# Patient Record
Sex: Female | Born: 1970 | Race: White | Hispanic: No | Marital: Single | Smoking: Former smoker
Health system: Southern US, Community
[De-identification: ages and names within clinical notes are randomized; demographics above are authoritative.]

## PROBLEM LIST (undated history)

## (undated) DIAGNOSIS — M549 Dorsalgia, unspecified: Secondary | ICD-10-CM

## (undated) DIAGNOSIS — R51 Headache: Secondary | ICD-10-CM

## (undated) DIAGNOSIS — E119 Type 2 diabetes mellitus without complications: Principal | ICD-10-CM

## (undated) DIAGNOSIS — F329 Major depressive disorder, single episode, unspecified: Secondary | ICD-10-CM

## (undated) DIAGNOSIS — F988 Other specified behavioral and emotional disorders with onset usually occurring in childhood and adolescence: Secondary | ICD-10-CM

## (undated) DIAGNOSIS — M797 Fibromyalgia: Secondary | ICD-10-CM

## (undated) DIAGNOSIS — R61 Generalized hyperhidrosis: Secondary | ICD-10-CM

## (undated) DIAGNOSIS — F419 Anxiety disorder, unspecified: Secondary | ICD-10-CM

## (undated) DIAGNOSIS — R519 Headache, unspecified: Secondary | ICD-10-CM

## (undated) DIAGNOSIS — M2550 Pain in unspecified joint: Secondary | ICD-10-CM

## (undated) DIAGNOSIS — H6990 Unspecified Eustachian tube disorder, unspecified ear: Secondary | ICD-10-CM

## (undated) DIAGNOSIS — L299 Pruritus, unspecified: Secondary | ICD-10-CM

## (undated) DIAGNOSIS — F32A Depression, unspecified: Secondary | ICD-10-CM

## (undated) DIAGNOSIS — G8929 Other chronic pain: Secondary | ICD-10-CM

## (undated) DIAGNOSIS — K219 Gastro-esophageal reflux disease without esophagitis: Secondary | ICD-10-CM

## (undated) DIAGNOSIS — E739 Lactose intolerance, unspecified: Secondary | ICD-10-CM

## (undated) HISTORY — PX: SINUS EXPLORATION: SHX5214

## (undated) HISTORY — DX: Pain in unspecified joint: M25.50

## (undated) HISTORY — DX: Headache, unspecified: R51.9

## (undated) HISTORY — DX: Headache: R51

## (undated) HISTORY — DX: Generalized hyperhidrosis: R61

## (undated) HISTORY — DX: Major depressive disorder, single episode, unspecified: F32.9

## (undated) HISTORY — DX: Headache, unspecified: G89.29

## (undated) HISTORY — DX: Dorsalgia, unspecified: M54.9

## (undated) HISTORY — DX: Pruritus, unspecified: L29.9

## (undated) HISTORY — DX: Type 2 diabetes mellitus without complications: E11.9

## (undated) HISTORY — DX: Depression, unspecified: F32.A

## (undated) HISTORY — DX: Anxiety disorder, unspecified: F41.9

## (undated) HISTORY — PX: OTHER SURGICAL HISTORY: SHX169

## (undated) HISTORY — DX: Other specified behavioral and emotional disorders with onset usually occurring in childhood and adolescence: F98.8

## (undated) HISTORY — DX: Gastro-esophageal reflux disease without esophagitis: K21.9

## (undated) HISTORY — DX: Fibromyalgia: M79.7

## (undated) HISTORY — DX: Unspecified eustachian tube disorder, unspecified ear: H69.90

## (undated) HISTORY — DX: Lactose intolerance, unspecified: E73.9

---

## 1999-05-22 ENCOUNTER — Other Ambulatory Visit: Admission: RE | Admit: 1999-05-22 | Discharge: 1999-05-22 | Payer: Self-pay | Admitting: *Deleted

## 1999-05-22 ENCOUNTER — Encounter (INDEPENDENT_AMBULATORY_CARE_PROVIDER_SITE_OTHER): Payer: Self-pay

## 1999-08-21 ENCOUNTER — Other Ambulatory Visit: Admission: RE | Admit: 1999-08-21 | Discharge: 1999-08-21 | Payer: Self-pay | Admitting: *Deleted

## 1999-11-20 ENCOUNTER — Other Ambulatory Visit: Admission: RE | Admit: 1999-11-20 | Discharge: 1999-11-20 | Payer: Self-pay | Admitting: *Deleted

## 2000-07-24 ENCOUNTER — Ambulatory Visit (HOSPITAL_COMMUNITY): Admission: RE | Admit: 2000-07-24 | Discharge: 2000-07-24 | Payer: Self-pay | Admitting: Obstetrics & Gynecology

## 2000-07-24 ENCOUNTER — Encounter: Payer: Self-pay | Admitting: Obstetrics & Gynecology

## 2000-12-09 ENCOUNTER — Inpatient Hospital Stay (HOSPITAL_COMMUNITY): Admission: AD | Admit: 2000-12-09 | Discharge: 2000-12-12 | Payer: Self-pay | Admitting: Obstetrics & Gynecology

## 2001-01-06 ENCOUNTER — Other Ambulatory Visit: Admission: RE | Admit: 2001-01-06 | Discharge: 2001-01-06 | Payer: Self-pay | Admitting: Obstetrics & Gynecology

## 2002-02-11 ENCOUNTER — Encounter: Admission: RE | Admit: 2002-02-11 | Discharge: 2002-02-11 | Payer: Self-pay | Admitting: Obstetrics & Gynecology

## 2002-02-11 ENCOUNTER — Encounter: Payer: Self-pay | Admitting: Obstetrics & Gynecology

## 2006-02-09 ENCOUNTER — Ambulatory Visit: Payer: Self-pay | Admitting: Family Medicine

## 2006-05-08 ENCOUNTER — Ambulatory Visit: Payer: Self-pay | Admitting: Family Medicine

## 2006-06-19 ENCOUNTER — Ambulatory Visit: Payer: Self-pay | Admitting: Family Medicine

## 2006-06-24 ENCOUNTER — Ambulatory Visit: Payer: Self-pay | Admitting: Family Medicine

## 2006-07-31 ENCOUNTER — Ambulatory Visit: Payer: Self-pay | Admitting: Family Medicine

## 2006-08-29 DIAGNOSIS — F329 Major depressive disorder, single episode, unspecified: Secondary | ICD-10-CM

## 2006-08-29 DIAGNOSIS — F3289 Other specified depressive episodes: Secondary | ICD-10-CM | POA: Insufficient documentation

## 2006-11-19 ENCOUNTER — Ambulatory Visit: Payer: Self-pay | Admitting: Internal Medicine

## 2007-02-17 ENCOUNTER — Ambulatory Visit: Payer: Self-pay | Admitting: Family Medicine

## 2007-02-23 ENCOUNTER — Telehealth (INDEPENDENT_AMBULATORY_CARE_PROVIDER_SITE_OTHER): Payer: Self-pay | Admitting: Family Medicine

## 2007-02-24 ENCOUNTER — Telehealth (INDEPENDENT_AMBULATORY_CARE_PROVIDER_SITE_OTHER): Payer: Self-pay | Admitting: *Deleted

## 2007-02-26 ENCOUNTER — Telehealth (INDEPENDENT_AMBULATORY_CARE_PROVIDER_SITE_OTHER): Payer: Self-pay | Admitting: *Deleted

## 2007-03-25 ENCOUNTER — Telehealth (INDEPENDENT_AMBULATORY_CARE_PROVIDER_SITE_OTHER): Payer: Self-pay | Admitting: *Deleted

## 2007-03-26 ENCOUNTER — Ambulatory Visit: Payer: Self-pay | Admitting: Family Medicine

## 2007-03-26 DIAGNOSIS — G43009 Migraine without aura, not intractable, without status migrainosus: Secondary | ICD-10-CM | POA: Insufficient documentation

## 2007-03-26 DIAGNOSIS — H6990 Unspecified Eustachian tube disorder, unspecified ear: Secondary | ICD-10-CM | POA: Insufficient documentation

## 2007-04-15 ENCOUNTER — Telehealth (INDEPENDENT_AMBULATORY_CARE_PROVIDER_SITE_OTHER): Payer: Self-pay | Admitting: Family Medicine

## 2007-04-21 ENCOUNTER — Ambulatory Visit: Payer: Self-pay | Admitting: Family Medicine

## 2007-04-22 ENCOUNTER — Encounter (INDEPENDENT_AMBULATORY_CARE_PROVIDER_SITE_OTHER): Payer: Self-pay | Admitting: Family Medicine

## 2007-04-27 ENCOUNTER — Telehealth (INDEPENDENT_AMBULATORY_CARE_PROVIDER_SITE_OTHER): Payer: Self-pay | Admitting: *Deleted

## 2007-06-08 ENCOUNTER — Encounter (INDEPENDENT_AMBULATORY_CARE_PROVIDER_SITE_OTHER): Payer: Self-pay | Admitting: Family Medicine

## 2007-06-21 ENCOUNTER — Telehealth (INDEPENDENT_AMBULATORY_CARE_PROVIDER_SITE_OTHER): Payer: Self-pay | Admitting: *Deleted

## 2007-07-13 ENCOUNTER — Encounter (INDEPENDENT_AMBULATORY_CARE_PROVIDER_SITE_OTHER): Payer: Self-pay | Admitting: Family Medicine

## 2007-07-15 ENCOUNTER — Ambulatory Visit: Payer: Self-pay | Admitting: Family Medicine

## 2007-09-22 ENCOUNTER — Encounter (INDEPENDENT_AMBULATORY_CARE_PROVIDER_SITE_OTHER): Payer: Self-pay | Admitting: Family Medicine

## 2007-10-11 ENCOUNTER — Telehealth (INDEPENDENT_AMBULATORY_CARE_PROVIDER_SITE_OTHER): Payer: Self-pay | Admitting: Family Medicine

## 2007-11-08 ENCOUNTER — Ambulatory Visit: Payer: Self-pay | Admitting: Family Medicine

## 2008-05-03 ENCOUNTER — Telehealth (INDEPENDENT_AMBULATORY_CARE_PROVIDER_SITE_OTHER): Payer: Self-pay | Admitting: *Deleted

## 2008-05-22 ENCOUNTER — Ambulatory Visit: Payer: Self-pay | Admitting: Family Medicine

## 2008-05-22 DIAGNOSIS — F988 Other specified behavioral and emotional disorders with onset usually occurring in childhood and adolescence: Secondary | ICD-10-CM | POA: Insufficient documentation

## 2008-06-20 ENCOUNTER — Ambulatory Visit: Payer: Self-pay | Admitting: Family Medicine

## 2008-06-27 ENCOUNTER — Ambulatory Visit: Payer: Self-pay | Admitting: Family Medicine

## 2008-06-27 LAB — CONVERTED CEMR LAB: Rapid Strep: POSITIVE

## 2008-07-12 ENCOUNTER — Ambulatory Visit: Payer: Self-pay | Admitting: Internal Medicine

## 2008-07-12 DIAGNOSIS — J310 Chronic rhinitis: Secondary | ICD-10-CM | POA: Insufficient documentation

## 2008-08-16 ENCOUNTER — Telehealth (INDEPENDENT_AMBULATORY_CARE_PROVIDER_SITE_OTHER): Payer: Self-pay | Admitting: *Deleted

## 2008-08-24 ENCOUNTER — Encounter (INDEPENDENT_AMBULATORY_CARE_PROVIDER_SITE_OTHER): Payer: Self-pay | Admitting: *Deleted

## 2008-08-24 ENCOUNTER — Ambulatory Visit: Payer: Self-pay | Admitting: Family Medicine

## 2008-08-24 DIAGNOSIS — J019 Acute sinusitis, unspecified: Secondary | ICD-10-CM | POA: Insufficient documentation

## 2008-09-12 ENCOUNTER — Ambulatory Visit: Payer: Self-pay | Admitting: Family Medicine

## 2008-09-12 ENCOUNTER — Telehealth (INDEPENDENT_AMBULATORY_CARE_PROVIDER_SITE_OTHER): Payer: Self-pay | Admitting: *Deleted

## 2008-09-15 ENCOUNTER — Telehealth (INDEPENDENT_AMBULATORY_CARE_PROVIDER_SITE_OTHER): Payer: Self-pay | Admitting: *Deleted

## 2008-09-19 ENCOUNTER — Telehealth (INDEPENDENT_AMBULATORY_CARE_PROVIDER_SITE_OTHER): Payer: Self-pay | Admitting: *Deleted

## 2008-09-26 ENCOUNTER — Encounter: Payer: Self-pay | Admitting: Family Medicine

## 2008-10-16 ENCOUNTER — Telehealth (INDEPENDENT_AMBULATORY_CARE_PROVIDER_SITE_OTHER): Payer: Self-pay | Admitting: *Deleted

## 2008-10-20 ENCOUNTER — Ambulatory Visit: Payer: Self-pay | Admitting: Family Medicine

## 2008-11-14 ENCOUNTER — Telehealth (INDEPENDENT_AMBULATORY_CARE_PROVIDER_SITE_OTHER): Payer: Self-pay | Admitting: *Deleted

## 2008-11-15 ENCOUNTER — Telehealth (INDEPENDENT_AMBULATORY_CARE_PROVIDER_SITE_OTHER): Payer: Self-pay | Admitting: *Deleted

## 2008-12-18 ENCOUNTER — Telehealth (INDEPENDENT_AMBULATORY_CARE_PROVIDER_SITE_OTHER): Payer: Self-pay | Admitting: *Deleted

## 2009-01-16 ENCOUNTER — Ambulatory Visit: Payer: Self-pay | Admitting: Family Medicine

## 2009-02-15 ENCOUNTER — Telehealth (INDEPENDENT_AMBULATORY_CARE_PROVIDER_SITE_OTHER): Payer: Self-pay | Admitting: *Deleted

## 2009-03-02 ENCOUNTER — Ambulatory Visit: Payer: Self-pay | Admitting: Family Medicine

## 2009-03-02 DIAGNOSIS — M25559 Pain in unspecified hip: Secondary | ICD-10-CM | POA: Insufficient documentation

## 2009-03-20 ENCOUNTER — Telehealth (INDEPENDENT_AMBULATORY_CARE_PROVIDER_SITE_OTHER): Payer: Self-pay | Admitting: *Deleted

## 2009-04-18 ENCOUNTER — Telehealth (INDEPENDENT_AMBULATORY_CARE_PROVIDER_SITE_OTHER): Payer: Self-pay | Admitting: *Deleted

## 2009-05-16 ENCOUNTER — Telehealth (INDEPENDENT_AMBULATORY_CARE_PROVIDER_SITE_OTHER): Payer: Self-pay | Admitting: *Deleted

## 2009-06-14 ENCOUNTER — Telehealth (INDEPENDENT_AMBULATORY_CARE_PROVIDER_SITE_OTHER): Payer: Self-pay | Admitting: *Deleted

## 2009-06-22 ENCOUNTER — Telehealth (INDEPENDENT_AMBULATORY_CARE_PROVIDER_SITE_OTHER): Payer: Self-pay | Admitting: *Deleted

## 2009-06-27 ENCOUNTER — Encounter: Payer: Self-pay | Admitting: Family Medicine

## 2009-07-18 ENCOUNTER — Telehealth: Payer: Self-pay | Admitting: Family Medicine

## 2009-07-23 ENCOUNTER — Telehealth (INDEPENDENT_AMBULATORY_CARE_PROVIDER_SITE_OTHER): Payer: Self-pay | Admitting: *Deleted

## 2009-08-16 ENCOUNTER — Ambulatory Visit: Payer: Self-pay | Admitting: Family Medicine

## 2009-08-20 ENCOUNTER — Telehealth (INDEPENDENT_AMBULATORY_CARE_PROVIDER_SITE_OTHER): Payer: Self-pay | Admitting: *Deleted

## 2009-09-25 ENCOUNTER — Telehealth (INDEPENDENT_AMBULATORY_CARE_PROVIDER_SITE_OTHER): Payer: Self-pay | Admitting: *Deleted

## 2009-09-26 ENCOUNTER — Encounter: Payer: Self-pay | Admitting: Family Medicine

## 2009-10-19 ENCOUNTER — Telehealth (INDEPENDENT_AMBULATORY_CARE_PROVIDER_SITE_OTHER): Payer: Self-pay | Admitting: *Deleted

## 2009-10-22 ENCOUNTER — Ambulatory Visit: Payer: Self-pay | Admitting: Family Medicine

## 2009-10-24 ENCOUNTER — Telehealth (INDEPENDENT_AMBULATORY_CARE_PROVIDER_SITE_OTHER): Payer: Self-pay | Admitting: *Deleted

## 2009-10-25 ENCOUNTER — Encounter: Payer: Self-pay | Admitting: Family Medicine

## 2009-11-21 ENCOUNTER — Telehealth (INDEPENDENT_AMBULATORY_CARE_PROVIDER_SITE_OTHER): Payer: Self-pay | Admitting: *Deleted

## 2009-12-27 ENCOUNTER — Telehealth (INDEPENDENT_AMBULATORY_CARE_PROVIDER_SITE_OTHER): Payer: Self-pay | Admitting: *Deleted

## 2010-01-07 ENCOUNTER — Ambulatory Visit: Payer: Self-pay | Admitting: Family Medicine

## 2010-01-07 DIAGNOSIS — F32A Depression, unspecified: Secondary | ICD-10-CM | POA: Insufficient documentation

## 2010-01-07 DIAGNOSIS — F418 Other specified anxiety disorders: Secondary | ICD-10-CM | POA: Insufficient documentation

## 2010-01-07 DIAGNOSIS — F438 Other reactions to severe stress: Secondary | ICD-10-CM | POA: Insufficient documentation

## 2010-01-29 ENCOUNTER — Telehealth (INDEPENDENT_AMBULATORY_CARE_PROVIDER_SITE_OTHER): Payer: Self-pay | Admitting: *Deleted

## 2010-02-26 ENCOUNTER — Telehealth (INDEPENDENT_AMBULATORY_CARE_PROVIDER_SITE_OTHER): Payer: Self-pay | Admitting: *Deleted

## 2010-03-01 ENCOUNTER — Telehealth (INDEPENDENT_AMBULATORY_CARE_PROVIDER_SITE_OTHER): Payer: Self-pay | Admitting: *Deleted

## 2010-04-01 ENCOUNTER — Telehealth (INDEPENDENT_AMBULATORY_CARE_PROVIDER_SITE_OTHER): Payer: Self-pay | Admitting: *Deleted

## 2010-05-03 ENCOUNTER — Telehealth (INDEPENDENT_AMBULATORY_CARE_PROVIDER_SITE_OTHER): Payer: Self-pay | Admitting: *Deleted

## 2010-06-03 ENCOUNTER — Telehealth: Payer: Self-pay | Admitting: Family Medicine

## 2010-06-05 ENCOUNTER — Encounter: Payer: Self-pay | Admitting: Family Medicine

## 2010-06-05 ENCOUNTER — Telehealth (INDEPENDENT_AMBULATORY_CARE_PROVIDER_SITE_OTHER): Payer: Self-pay | Admitting: *Deleted

## 2010-07-08 ENCOUNTER — Encounter: Payer: Self-pay | Admitting: Family Medicine

## 2010-07-09 ENCOUNTER — Ambulatory Visit: Payer: Self-pay | Admitting: Family Medicine

## 2010-07-09 DIAGNOSIS — R635 Abnormal weight gain: Secondary | ICD-10-CM | POA: Insufficient documentation

## 2010-07-11 ENCOUNTER — Telehealth (INDEPENDENT_AMBULATORY_CARE_PROVIDER_SITE_OTHER): Payer: Self-pay | Admitting: *Deleted

## 2010-07-11 LAB — CONVERTED CEMR LAB
Free T4: 0.88 ng/dL (ref 0.60–1.60)
T3, Free: 3.2 pg/mL (ref 2.3–4.2)
TSH: 0.9 microintl units/mL (ref 0.35–5.50)

## 2010-07-17 ENCOUNTER — Encounter
Admission: RE | Admit: 2010-07-17 | Discharge: 2010-07-17 | Payer: Self-pay | Source: Home / Self Care | Attending: Family Medicine | Admitting: Family Medicine

## 2010-07-22 ENCOUNTER — Telehealth: Payer: Self-pay | Admitting: Family Medicine

## 2010-08-06 ENCOUNTER — Telehealth (INDEPENDENT_AMBULATORY_CARE_PROVIDER_SITE_OTHER): Payer: Self-pay | Admitting: *Deleted

## 2010-09-03 NOTE — Progress Notes (Signed)
Summary:  prior auth APPROVED ADDERALL [facility]  Phone Note Refill Request Message from:  Fax from Pharmacy on September 25, 2009 4:18 PM  Refills Requested: Medication #1:  ADDERALL XR 30 MG XR24H-CAP 1 by mouth qam prior authorization fax [phone] [facility]   Initial call taken by: Barb Merino,  September 25, 2009 4:18 PM  Follow-up for Phone Call        prior auth in process awaiting fax.Marti Sleigh Deloach CMA  September 25, 2009 4:45 PM  prior auth faxed back awaiting response.................Marland KitchenFelecia Deloach CMA  September 26, 2009 12:05 PM   Additional Follow-up for Phone Call Additional follow up Details #1::        PRIOR AUTH APPROVED 09-05-09 UNTIL 09-26-10, PHARMACY FAXED APPROVAL LETTER SCAN TO CHART..................Marland KitchenFelecia Deloach CMA  September 27, 2009 8:31 AM

## 2010-09-03 NOTE — Progress Notes (Signed)
Summary: refill  Phone Note Refill Request Message from:  Fax from Pharmacy on [facility]  Refills Requested: Medication #1:  ZOLOFT 100 MG TABS 1 by mouth once daily   Supply Requested: 3 months Initial call taken by: Jeremy Johann CMA,  March 01, 2010 4:25 PM  Follow-up for Phone Call        left pt detail message rx request recieved from [facility] pharmacy, rx sent in please return call if pt does not want rx sent to mail order................Marland KitchenFelecia Deloach CMA  March 01, 2010 4:27 PM     Prescriptions: ZOLOFT 100 MG TABS (SERTRALINE HCL) 1 by mouth once daily  #90 x 3   Entered by:   Jeremy Johann CMA   Authorized by:   Loreen Freud DO   Signed by:   Jeremy Johann CMA on 03/01/2010   Method used:   Faxed to ...       [facility]* (retail)             ,          Ph: [phone]       Fax: [phone]   RxID:   [id number]

## 2010-09-03 NOTE — Progress Notes (Signed)
Summary: med not strong enough  Phone Note Call from Patient   Caller: Patient Summary of Call: pt states that she is really stressed, moody,complaining a lot and tolerate level has decrease. pt states that she is taking zoloft 50mg  once daily  and it did work initially but now it feel as though it not helping with all the symptoms completely..pls advise......................Marland KitchenFelecia Deloach CMA  October 19, 2009 11:16 AM   Follow-up for Phone Call        ok to take 100mg  but pt needs ov Follow-up by: Loreen Freud DO,  October 19, 2009 12:50 PM  Additional Follow-up for Phone Call Additional follow up Details #1::        left pt detail message ok to increase med to 100mg  and to give [place] a call to schedule OV to further discuss changes in symptoms...........Marland KitchenFelecia Deloach CMA  October 19, 2009 12:57 PM     Additional Follow-up for Phone Call Additional follow up Details #2::    pt aware, OV schedule....................Marland KitchenFelecia Deloach CMA  October 22, 2009 8:44 AM   New/Updated Medications: ZOLOFT 50 MG TABS (SERTRALINE HCL) 2 by mouth once daily

## 2010-09-03 NOTE — Progress Notes (Signed)
Summary: refill  Phone Note Refill Request Call back at Work Phone [phone] Message from:  Patient on September 25, 2009 10:16 AM  Refills Requested: Medication #1:  ADDERALL XR 30 MG XR24H-CAP 1 by mouth qam  Method Requested: Pick up at Office Next Appointment Scheduled: no appt Initial call taken by: Barb Merino,  September 25, 2009 10:16 AM    Prescriptions: ADDERALL XR 30 MG XR24H-CAP (AMPHETAMINE-DEXTROAMPHETAMINE) 1 by mouth qam  #30 x 0   Entered by:   Army Fossa CMA   Authorized by:   Loreen Freud DO   Signed by:   Army Fossa CMA on 09/25/2009   Method used:   Print then Give to Patient   RxID:   [id number]   Appended Document: refill pt aware that rx is ready.

## 2010-09-03 NOTE — Assessment & Plan Note (Signed)
Summary: FOR MED REFILL   Vital Signs:  Patient profile:   40 year old female Height:      64 inches Weight:      154.6 pounds BMI:     26.63 Pulse rate:   56 / minute Pulse rhythm:   regular BP sitting:   116 / 74  (right arm) Cuff size:   large  Vitals Entered By: Almeta Monas CMA Duncan Dull) (July 09, 2010 4:11 PM) CC: Med f/u--Ritalin not as effective as Adderall also wanted to get a TSH done.   History of Present Illness: Pt here f/u ritalin--she feels like it is not as effective as adderall.   Pt also c/o difficulty losing weight.  Pt is exercising 2-3 days a week at gym.  She is trying to eat healthy--1700 cal---using app on phone.   Pt saw nutrition about a year ago when her husband had an MI at 77yo.    Current Medications (verified): 1)  Loestrin 24 Fe 1-20 Mg-Mcg Tabs (Norethin Ace-Eth Estrad-Fe) .... Take 1 Tab Once Daily 2)  Adderall Xr 30 Mg Xr24h-Cap (Amphetamine-Dextroamphetamine) .[name].. 1 By Mouth Qam 3)  Fluticasone Propionate 50 Mcg/act Susp (Fluticasone Propionate) .[name].. 1-2 Sprays Each Nostril Once Daily 4)  Zoloft 100 Mg Tabs (Sertraline Hcl) .[name].. 1 By Mouth Once Daily 5)  Iron 18 Mg Cr-Tabs (Ferrous Fumarate) .... Take 1 Tab Once Daily 6)  Xyzal 5 Mg Tabs (Levocetirizine Dihydrochloride) .[name].. 1 By Mouth Qpm 7)  Ritalin La 30 Mg Xr24h-Cap (Methylphenidate Hcl) .[name].. 1 By Mouth Qam  Allergies (verified): 1)  Augmentin  Past History:  Past Medical History: Last updated: 06/20/2008 Depression Current Problems:  ADD (ICD-314.00) DISORDER, EUSTACHIAN TUBE NOS (ICD-381.9) COMMON MIGRAINE (ICD-346.10) SINUSITIS (ICD-473.9) DEPRESSION (ICD-311)  Family History: Last updated: 11/08/2007 sister-- ADD F--dyslexia  Review of Systems      See HPI  Physical Exam  General:  Well-developed,well-nourished,in no acute distress; alert,appropriate and cooperative throughout examination Neck:  ? nodule L side thyroid Lungs:  Normal respiratory effort, chest  expands symmetrically. Lungs are clear to auscultation, no crackles or wheezes. Heart:  normal rate and no murmur.   Psych:  Cognition and judgment appear intact. Alert and cooperative with normal attention span and concentration. No apparent delusions, illusions, hallucinations   Impression & Recommendations:  Problem # 1:  ? of THYROID NODULE (ICD-241.0)  Orders: TLB-TSH (Thyroid Stimulating Hormone) (84443-TSH) Venipuncture (16109) TLB-T3, Free (Triiodothyronine) (84481-T3FREE) TLB-T4 (Thyrox), Free [phone]) Specimen Handling (19147) Radiology Referral (Radiology)  Problem # 2:  WEIGHT GAIN (ICD-783.1)  Orders: TLB-TSH (Thyroid Stimulating Hormone) (84443-TSH) Venipuncture (82956) TLB-T3, Free (Triiodothyronine) (84481-T3FREE) TLB-T4 (Thyrox), Free [phone]) Specimen Handling (46962) Radiology Referral (Radiology)  Problem # 3:  ADD (ICD-314.00) increase ritalin la 30 mg once daily   Complete Medication List: 1)  Loestrin 24 Fe 1-20 Mg-mcg Tabs (Norethin ace-eth estrad-fe) .... Take 1 tab once daily 2)  Adderall Xr 30 Mg Xr24h-cap (Amphetamine-dextroamphetamine) .[name].. 1 by mouth qam 3)  Fluticasone Propionate 50 Mcg/act Susp (Fluticasone propionate) .[name].. 1-2 sprays each nostril once daily 4)  Zoloft 100 Mg Tabs (Sertraline hcl) .[name].. 1 by mouth once daily 5)  Iron 18 Mg Cr-tabs (Ferrous fumarate) .... Take 1 tab once daily 6)  Xyzal 5 Mg Tabs (Levocetirizine dihydrochloride) .[name].. 1 by mouth qpm 7)  Ritalin La 30 Mg Xr24h-cap (Methylphenidate hcl) .[name].. 1 by mouth qam Prescriptions: RITALIN LA 30 MG XR24H-CAP (METHYLPHENIDATE HCL) 1 by mouth qam  #30 x 0   Entered and  Authorized by:   Loreen Freud DO   Signed by:   Loreen Freud DO on 07/09/2010   Method used:   Print then Give to Patient   RxID:   [id number]    Orders Added: 1)  TLB-TSH (Thyroid Stimulating Hormone) [84443-TSH] 2)  Venipuncture [14782] 3)  TLB-T3, Free (Triiodothyronine)  [95621-H0QMVH] 4)  TLB-T4 (Thyrox), Free [84696-EX5M] 5)  Specimen Handling [99000] 6)  Est. Patient Level III [84132] 7)  Radiology Referral [Radiology]    Impression & Recommendations:  Problem # 1:  ? of THYROID NODULE (ICD-241.0)  Orders: TLB-TSH (Thyroid Stimulating Hormone) (84443-TSH) Venipuncture (44010) TLB-T3, Free (Triiodothyronine) (84481-T3FREE) TLB-T4 (Thyrox), Free [phone]) Specimen Handling (03474) Radiology Referral (Radiology)  Problem # 2:  WEIGHT GAIN (ICD-783.1)  Orders: TLB-TSH (Thyroid Stimulating Hormone) (84443-TSH) Venipuncture (25956) TLB-T3, Free (Triiodothyronine) (84481-T3FREE) TLB-T4 (Thyrox), Free (38756-EP3I) Specimen Handling (95188) Radiology Referral (Radiology)  Problem # 3:  ADD (ICD-314.00) increase ritalin la 30 mg once daily   Complete Medication List: 1)  Loestrin 24 Fe 1-20 Mg-mcg Tabs (Norethin ace-eth estrad-fe) .... Take 1 tab once daily 2)  Adderall Xr 30 Mg Xr24h-cap (Amphetamine-dextroamphetamine) .[name].. 1 by mouth qam 3)  Fluticasone Propionate 50 Mcg/act Susp (Fluticasone propionate) .[name].. 1-2 sprays each nostril once daily 4)  Zoloft 100 Mg Tabs (Sertraline hcl) .[name].. 1 by mouth once daily 5)  Iron 18 Mg Cr-tabs (Ferrous fumarate) .... Take 1 tab once daily 6)  Xyzal 5 Mg Tabs (Levocetirizine dihydrochloride) .[name].. 1 by mouth qpm 7)  Ritalin La 30 Mg Xr24h-cap (Methylphenidate hcl) .[name].. 1 by mouth qam

## 2010-09-03 NOTE — Progress Notes (Signed)
Summary: prior [facility]  Phone Note Refill Request   Refills Requested: Medication #1:  XYZAL 5 MG TABS 1 by mouth qpm. prior auth [phone]  Initial call taken by: Jeremy Johann CMA,  October 24, 2009 4:36 PM  Follow-up for Phone Call        prior auth faxed back awaiting response........Marland KitchenFelecia Deloach CMA  October 25, 2009 9:18 AM   Additional Follow-up for Phone Call Additional follow up Details #1::        PRIOR AUTH APPROVED 10-04-09 UNTIL 10-25-10, PHARMACY FAXED, APPROVAL LETTER SCAN TO CHART.....................Marland KitchenFelecia Deloach CMA  October 26, 2009 9:01 AM

## 2010-09-03 NOTE — Medication Information (Signed)
Summary: Prior Authorization & Approval for Ritalin/[facility]  Prior Authorization & Approval for Ritalin/[facility]   Imported By: Lanelle Bal 06/11/2010 14:15:08  _____________________________________________________________________  External Attachment:    Type:   Image     Comment:   External Document

## 2010-09-03 NOTE — Assessment & Plan Note (Signed)
Summary: discuss med//fd   Vital Signs:  Patient profile:   40 year old female Weight:      153 pounds Pulse rate:   88 / minute Pulse rhythm:   regular BP sitting:   118 / 84  (left arm) Cuff size:   large  Vitals Entered By: Army Fossa CMA (October 22, 2009 3:35 PM) CC: Pt here to follow up on zoloft also having a lot of drainage in her throat not all the time though.   History of Present Illness: Pt here to discuss zoloft.  She increased dose to 100mg  daily on friday as instructed--see phone note. Pt doing better emotionally..  Pt also needs refill on adderall and is c/o runny nose and drainage but pt just had sinus surgery.    Current Medications (verified): 1)  Loestrin 24 Fe 1-20 Mg-Mcg Tabs (Norethin Ace-Eth Estrad-Fe) .... Take 1 Tab Once Daily 2)  Adderall Xr 30 Mg Xr24h-Cap (Amphetamine-Dextroamphetamine) .[name].. 1 By Mouth Qam 3)  Fluticasone Propionate 50 Mcg/act Susp (Fluticasone Propionate) .[name].. 1-2 Sprays Each Nostril Once Daily 4)  Zoloft 100 Mg Tabs (Sertraline Hcl) .[name].. 1 By Mouth Once Daily 5)  Iron 18 Mg Cr-Tabs (Ferrous Fumarate) .... Take 1 Tab Once Daily 6)  Xyzal 5 Mg Tabs (Levocetirizine Dihydrochloride) .[name].. 1 By Mouth Qpm  Allergies: 1)  Augmentin  Past History:  Past medical, surgical, family and social histories (including risk factors) reviewed for relevance to current acute and chronic problems.  Past Medical History: Reviewed history from 06/20/2008 and no changes required. Depression Current Problems:  ADD (ICD-314.00) DISORDER, EUSTACHIAN TUBE NOS (ICD-381.9) COMMON MIGRAINE (ICD-346.10) SINUSITIS (ICD-473.9) DEPRESSION (ICD-311)  Family History: Reviewed history from 11/08/2007 and no changes required. sister-- ADD F--dyslexia  Social History: Reviewed history and no changes required.  Review of Systems      See HPI  Physical Exam  General:  Well-developed,well-nourished,in no acute distress; alert,appropriate and cooperative  throughout examination Ears:  External ear exam shows no significant lesions or deformities.  Otoscopic examination reveals clear canals, tympanic membranes are intact bilaterally without bulging, retraction, inflammation or discharge. Hearing is grossly normal bilaterally. Nose:  External nasal examination shows no deformity or inflammation. Nasal mucosa are pink and moist without lesions or exudates. Mouth:  pharyngeal erythema and postnasal drip.   Neck:  No deformities, masses, or tenderness noted. Lungs:  Normal respiratory effort, chest expands symmetrically. Lungs are clear to auscultation, no crackles or wheezes. Heart:  Normal rate and regular rhythm. S1 and S2 normal without gallop, murmur, click, rub or other extra sounds. Psych:  Oriented X3, memory intact for recent and remote, normally interactive, good eye contact, not anxious appearing, not depressed appearing, and not suicidal.     Impression & Recommendations:  Problem # 1:  ADD (ICD-314.00) refill adderall  Problem # 2:  DEPRESSION (ICD-311)  Her updated medication list for this problem includes:    Zoloft 100 Mg Tabs (Sertraline hcl) .[name]... 1 by mouth once daily  Discussed treatment options, including trial of antidpressant medication. Will refer to behavioral health. Follow-up call in in 24-48 hours and recheck in 2 weeks, sooner as needed. Patient agrees to call if any worsening of symptoms or thoughts of doing harm arise. Verified that the patient has no suicidal ideation at this time.   Problem # 3:  RHINITIS (ICD-472.0) xyzal daily rto as needed   Complete Medication List: 1)  Loestrin 24 Fe 1-20 Mg-mcg Tabs (Norethin ace-eth estrad-fe) .... Take 1 tab once  daily 2)  Adderall Xr 30 Mg Xr24h-cap (Amphetamine-dextroamphetamine) .[name].. 1 by mouth qam 3)  Fluticasone Propionate 50 Mcg/act Susp (Fluticasone propionate) .[name].. 1-2 sprays each nostril once daily 4)  Zoloft 100 Mg Tabs (Sertraline hcl) .[name].. 1 by mouth once  daily 5)  Iron 18 Mg Cr-tabs (Ferrous fumarate) .... Take 1 tab once daily 6)  Xyzal 5 Mg Tabs (Levocetirizine dihydrochloride) .[name].. 1 by mouth qpm Prescriptions: XYZAL 5 MG TABS (LEVOCETIRIZINE DIHYDROCHLORIDE) 1 by mouth qpm  #30 x 5   Entered and Authorized by:   Loreen Freud DO   Signed by:   Loreen Freud DO on 10/22/2009   Method used:   Electronically to        [id number]* (retail)       [address]       [place], [place]  [address]       Ph: [phone]       Fax: [phone]   RxID:   [id number] ADDERALL XR 30 MG XR24H-CAP (AMPHETAMINE-DEXTROAMPHETAMINE) 1 by mouth qam  #30 x 0   Entered and Authorized by:   Loreen Freud DO   Signed by:   Loreen Freud DO on 10/22/2009   Method used:   Print then Give to Patient   RxID:   [id number] ZOLOFT 100 MG TABS (SERTRALINE HCL) 1 by mouth once daily  #30 x 2   Entered and Authorized by:   Loreen Freud DO   Signed by:   Loreen Freud DO on 10/22/2009   Method used:   Print then Give to Patient   RxID:   [id number]

## 2010-09-03 NOTE — Medication Information (Signed)
Summary: Prior Authorization & Approval for Levocetirizine Dihydro/[facility]   Prior Authorization & Approval for Levocetirizine Dihydro/[facility]   Imported By: Lanelle Bal 10/31/2009 13:09:47  _____________________________________________________________________  External Attachment:    Type:   Image     Comment:   External Document

## 2010-09-03 NOTE — Progress Notes (Signed)
Summary: Refill Request  Phone Note Refill Request Message from:  Patient on February 26, 2010 9:04 AM  Refills Requested: Medication #1:  ADDERALL XR 30 MG XR24H-CAP 1 by mouth qam   Dosage confirmed as above?Dosage Confirmed   Supply Requested: 1 month   Last Refilled: 01/29/2010 pt can be reached at [phone]  Next Appointment Scheduled: none Initial call taken by: Lavell Islam,  February 26, 2010 9:05 AM  Follow-up for Phone Call        left pt detail message rx ready for pick-up................Marland KitchenFelecia Deloach CMA  February 26, 2010 3:29 PM     Prescriptions: ADDERALL XR 30 MG XR24H-CAP (AMPHETAMINE-DEXTROAMPHETAMINE) 1 by mouth qam  #30 x 0   Entered by:   Jeremy Johann CMA   Authorized by:   Loreen Freud DO   Signed by:   Jeremy Johann CMA on 02/26/2010   Method used:   Print then Give to Patient   RxID:   [id number]

## 2010-09-03 NOTE — Progress Notes (Signed)
Summary: REFILL REQUEST  Phone Note Refill Request Call back at Work Phone [phone] Call back at [phone] Message from:  Patient on January 29, 2010 9:13 AM  Refills Requested: Medication #1:  ADDERALL XR 30 MG XR24H-CAP 1 by mouth qam   Dosage confirmed as above?Dosage Confirmed   Supply Requested: 1 month   Last Refilled: 12/27/2009  Method Requested: Pick up at Office Next Appointment Scheduled: NONE Initial call taken by: Lavell Islam,  January 29, 2010 9:13 AM  Follow-up for Phone Call        pt aware rx ready for pick up...Marland KitchenMarland KitchenFelecia Deloach CMA  January 29, 2010 11:05 AM     Prescriptions: ADDERALL XR 30 MG XR24H-CAP (AMPHETAMINE-DEXTROAMPHETAMINE) 1 by mouth qam  #30 x 0   Entered by:   Jeremy Johann CMA   Authorized by:   Neena Rhymes MD   Signed by:   Jeremy Johann CMA on 01/29/2010   Method used:   Printed then faxed to ...       [id number]* (retail)       [address]       [place], [place]  [address]       Ph: [phone]       Fax: [phone]   RxID:   [id number]

## 2010-09-03 NOTE — Progress Notes (Signed)
Summary: prior auth APPROVED Ritalin [facility]  Phone Note Refill Request Message from:  Fax from Pharmacy  Refills Requested: Medication #1:  RITALIN LA 20 MG XR24H-CAP 1 by mouth once daily. prior Berkley Harvey [id number] - id [id number]  Initial call taken by: Okey Regal [place],  June 05, 2010 11:37 AM  Follow-up for Phone Call        awaiting fax case #[id number].........Marland KitchenFelecia Deloach CMA  June 05, 2010 12:04 PM  prior auth faxed back awaiting response.............Marland KitchenFelecia Deloach CMA  June 05, 2010 4:30 PM   Additional Follow-up for Phone Call Additional follow up Details #1::        Prior Auth approved 05-15-10 until 06-05-11, approval letter scan to chart, pharmacy faxed............Marland KitchenFelecia Deloach CMA  June 06, 2010 8:36 AM

## 2010-09-03 NOTE — Progress Notes (Signed)
Summary: Refill Request  Phone Note Refill Request Call back at Work Phone [phone] Message from:  Patient on Dec 27, 2009 2:01 PM  Refills Requested: Medication #1:  ADDERALL XR 30 MG XR24H-CAP 1 by mouth qam   Supply Requested: 1 month  Method Requested: Pick up at Office Next Appointment Scheduled: none Initial call taken by: Harold Barban,  Dec 27, 2009 2:01 PM  Follow-up for Phone Call        left message rx is ready. Army Fossa CMA  Dec 27, 2009 2:10 PM     Prescriptions: ADDERALL XR 30 MG XR24H-CAP (AMPHETAMINE-DEXTROAMPHETAMINE) 1 by mouth qam  #30 x 0   Entered by:   Army Fossa CMA   Authorized by:   Loreen Freud DO   Signed by:   Army Fossa CMA on 12/27/2009   Method used:   Print then Give to Patient   RxID:   [id number]

## 2010-09-03 NOTE — Progress Notes (Signed)
Summary: med does not need prior auth  Phone Note Refill Request Message from:  Fax from Pharmacy on [facility] [address] fax [phone]  d-amphetamine salt com xr 30mg  prior authorization [phone]  Initial call taken by: Barb Merino,  August 20, 2009 12:11 PM  Follow-up for Phone Call        this med was approved until 06/27/10, spoke with [facility] in ref to this pharmacy is running it on a different ins card, inform pharmcy to run under husband ins as before.  Pharmacy informed rx ran through ok .Kandice Hams  August 21, 2009 1:19 PM  Follow-up by: Kandice Hams,  August 21, 2009 1:19 PM

## 2010-09-03 NOTE — Medication Information (Signed)
Summary: Prior Authorization & Approval for Adderall/[facility]  Prior Authorization & Approval for Adderall/[facility]   Imported By: Lanelle Bal 10/02/2009 10:00:18  _____________________________________________________________________  External Attachment:    Type:   Image     Comment:   External Document

## 2010-09-03 NOTE — Progress Notes (Signed)
Summary: refill  Phone Note Refill Request Call back at Home Phone [phone] Message from:  Patient on November 21, 2009 10:28 AM  Refills Requested: Medication #1:  ADDERALL XR 30 MG XR24H-CAP 1 by mouth qam  Method Requested: Pick up at Office Next Appointment Scheduled: no appt Initial call taken by: Barb Merino,  November 21, 2009 10:28 AM  Follow-up for Phone Call        Pt aware rx is ready. Army Fossa CMA  November 21, 2009 10:34 AM     Prescriptions: ADDERALL XR 30 MG XR24H-CAP (AMPHETAMINE-DEXTROAMPHETAMINE) 1 by mouth qam  #30 x 0   Entered by:   Army Fossa CMA   Authorized by:   Loreen Freud DO   Signed by:   Army Fossa CMA on 11/21/2009   Method used:   Print then Give to Patient   RxID:   [id number]

## 2010-09-03 NOTE — Progress Notes (Signed)
Summary: ADDERRAL REFILL  Phone Note Call from Patient Call back at Work Phone [phone]   Caller: Patient Summary of Call: Patient needs Adderall xr 30mg  prescription refilled.  Advised patient that it will be ready in 24 hours for pickup unless someone calls them Initial call taken by: Jerolyn Shin,  May 03, 2010 2:44 PM    Prescriptions: ADDERALL XR 30 MG XR24H-CAP (AMPHETAMINE-DEXTROAMPHETAMINE) 1 by mouth qam  #30 x 0   Entered by:   Almeta Monas CMA (AAMA)   Authorized by:   Loreen Freud DO   Signed by:   Almeta Monas CMA (AAMA) on 05/03/2010   Method used:   Printed then faxed to ...       [id number]* (retail)       [address]       [place], [place]  [address]       Ph: [phone]       Fax: [phone]   RxID:   [id number]

## 2010-09-03 NOTE — Progress Notes (Signed)
Summary: refill  Phone Note Refill Request Message from:  Patient on April 01, 2010 3:59 PM  Refills Requested: Medication #1:  ADDERALL XR 30 MG XR24H-CAP 1 by mouth qam patient will pick up [id number] afternoon  Initial call taken by: Okey Regal Spring,  April 01, 2010 4:00 PM    Prescriptions: ADDERALL XR 30 MG XR24H-CAP (AMPHETAMINE-DEXTROAMPHETAMINE) 1 by mouth qam  #30 x 0   Entered and Authorized by:   Almeta Monas CMA (AAMA)   Signed by:   Almeta Monas CMA (AAMA) on 04/01/2010   Method used:   Print then Give to Patient   RxID:   [id number]  Rx taken to check in... Almeta Monas CMA Duncan Dull)  April 01, 2010 4:36 PM

## 2010-09-03 NOTE — Assessment & Plan Note (Signed)
Summary: RENEW MEDICATIONS/RH......   Vital Signs:  Patient profile:   40 year old female Height:      64 inches Weight:      248 pounds BMI:     42.72 Temp:     98.8 degrees F oral Pulse rate:   86 / minute Pulse rhythm:   regular BP sitting:   116 / 80  (left arm) Cuff size:   large  Vitals Entered By: Army Fossa CMA (August 16, 2009 4:10 PM) CC: Refills, Adderall,flonase,Zoloft.    History of Present Illness: Pt here for med refills.  No complaints.     Current Medications (verified): 1)  Loestrin 24 Fe 1-20 Mg-Mcg Tabs (Norethin Ace-Eth Estrad-Fe) .... Take 1 Tab Once Daily 2)  Adderall Xr 30 Mg Xr24h-Cap (Amphetamine-Dextroamphetamine) .[name].. 1 By Mouth Qam 3)  Fluticasone Propionate 50 Mcg/act Susp (Fluticasone Propionate) .[name].. 1-2 Sprays Each Nostril Once Daily 4)  Zoloft 50 Mg Tabs (Sertraline Hcl) .[name].. 1 By Mouth Once Daily 5)  Iron 18 Mg Cr-Tabs (Ferrous Fumarate) .... Take 1 Tab Once Daily 6)  Zyrtec Allergy 10 Mg Caps (Cetirizine Hcl) .[name].. 1 By Mouth Once Daily  Allergies (verified): 1)  Augmentin  Past History:  Past medical, surgical, family and social histories (including risk factors) reviewed for relevance to current acute and chronic problems.  Past Medical History: Reviewed history from 06/20/2008 and no changes required. Depression Current Problems:  ADD (ICD-314.00) DISORDER, EUSTACHIAN TUBE NOS (ICD-381.9) COMMON MIGRAINE (ICD-346.10) SINUSITIS (ICD-473.9) DEPRESSION (ICD-311)  Family History: Reviewed history from 11/08/2007 and no changes required. sister-- ADD F--dyslexia  Social History: Reviewed history and no changes required.  Review of Systems      See HPI  Physical Exam  General:  Well-developed,well-nourished,in no acute distress; alert,appropriate and cooperative throughout examination Lungs:  Normal respiratory effort, chest expands symmetrically. Lungs are clear to auscultation, no crackles or wheezes. Heart:  normal  rate and no murmur.   Psych:  Oriented X3, normally interactive, good eye contact, not anxious appearing, and not depressed appearing.     Impression & Recommendations:  Problem # 1:  ADD (ICD-314.00) refill adderall rto 6 months or sooner as needed   Problem # 2:  DEPRESSION (ICD-311) Assessment: Unchanged  Her updated medication list for this problem includes:    Zoloft 50 Mg Tabs (Sertraline hcl) .[name]... 1 by mouth once daily  Complete Medication List: 1)  Loestrin 24 Fe 1-20 Mg-mcg Tabs (Norethin ace-eth estrad-fe) .... Take 1 tab once daily 2)  Adderall Xr 30 Mg Xr24h-cap (Amphetamine-dextroamphetamine) .[name].. 1 by mouth qam 3)  Fluticasone Propionate 50 Mcg/act Susp (Fluticasone propionate) .[name].. 1-2 sprays each nostril once daily 4)  Zoloft 50 Mg Tabs (Sertraline hcl) .[name].. 1 by mouth once daily 5)  Iron 18 Mg Cr-tabs (Ferrous fumarate) .... Take 1 tab once daily 6)  Zyrtec Allergy 10 Mg Caps (Cetirizine hcl) .[name].. 1 by mouth once daily Prescriptions: FLUTICASONE PROPIONATE 50 MCG/ACT SUSP (FLUTICASONE PROPIONATE) 1-2 sprays each nostril once daily  #1 x 11   Entered and Authorized by:   Loreen Freud DO   Signed by:   Loreen Freud DO on 08/16/2009   Method used:   Historical   RxID:   [id number] ZOLOFT 50 MG TABS (SERTRALINE HCL) 1 by mouth once daily  #30.0 Each x 11   Entered and Authorized by:   Loreen Freud DO   Signed by:   Loreen Freud DO on 08/16/2009   Method used:   Electronically to  [facility] [address] [id number]* (retail)       [address]       [place], [place]  [address]       Ph: [phone]       Fax: [phone]   RxID:   [id number] FLUTICASONE PROPIONATE 50 MCG/ACT SUSP (FLUTICASONE PROPIONATE) 1 spray once daily - two times a day as needed  #1 x 11   Entered and Authorized by:   Loreen Freud DO   Signed by:   Loreen Freud DO on 08/16/2009   Method used:   Electronically to        [id number] (retail)       [address]       [place], [place]  [address]       Ph: [phone]       Fax: [phone]   RxID:   [id number] ADDERALL XR 30 MG XR24H-CAP (AMPHETAMINE-DEXTROAMPHETAMINE) 1 by mouth qam  #30 x 0   Entered and Authorized by:   Loreen Freud DO   Signed by:   Loreen Freud DO on 08/16/2009   Method used:   Print then Give to Patient   RxID:   [id number]

## 2010-09-03 NOTE — Progress Notes (Signed)
Summary: lab results back??  Phone Note Call from Patient Call back at Work Phone [phone]   Caller: Patient Summary of Call: patient called to see if her labs results were back--please call her at [phone] Initial call taken by: Jerolyn Shin,  July 11, 2010 11:01 AM  Follow-up for Phone Call        spk with pt and made her aware of TSH,T3,T4 wnl.... Almeta Monas CMA Duncan Dull)  July 11, 2010 11:55 AM  Follow-up by: Almeta Monas CMA Duncan Dull),  July 11, 2010 11:56 AM

## 2010-09-03 NOTE — Assessment & Plan Note (Signed)
Summary: talk about xanax rx for [facility]//lch   Vital Signs:  Patient profile:   40 year old female Weight:      147 pounds Pulse rate:   86 / minute Pulse rhythm:   regular BP sitting:   116 / 80  (left arm) Cuff size:   large  Vitals Entered By: Army Fossa CMA (January 07, 2010 2:53 PM) CC: Pt here for Xanax, they have season passes.   History of Present Illness: Pt here to request xanax to go to [facility].  She also needs refills.  No other complaints  Current Medications (verified): 1)  Loestrin 24 Fe 1-20 Mg-Mcg Tabs (Norethin Ace-Eth Estrad-Fe) .... Take 1 Tab Once Daily 2)  Adderall Xr 30 Mg Xr24h-Cap (Amphetamine-Dextroamphetamine) .[name].. 1 By Mouth Qam 3)  Fluticasone Propionate 50 Mcg/act Susp (Fluticasone Propionate) .[name].. 1-2 Sprays Each Nostril Once Daily 4)  Zoloft 100 Mg Tabs (Sertraline Hcl) .[name].. 1 By Mouth Once Daily 5)  Iron 18 Mg Cr-Tabs (Ferrous Fumarate) .... Take 1 Tab Once Daily 6)  Xyzal 5 Mg Tabs (Levocetirizine Dihydrochloride) .[name].. 1 By Mouth Qpm  Allergies: 1)  Augmentin  Past History:  Past medical, surgical, family and social histories (including risk factors) reviewed for relevance to current acute and chronic problems.  Past Medical History: Reviewed history from 06/20/2008 and no changes required. Depression Current Problems:  ADD (ICD-314.00) DISORDER, EUSTACHIAN TUBE NOS (ICD-381.9) COMMON MIGRAINE (ICD-346.10) SINUSITIS (ICD-473.9) DEPRESSION (ICD-311)  Family History: Reviewed history from 11/08/2007 and no changes required. sister-- ADD F--dyslexia  Social History: Reviewed history and no changes required.  Review of Systems      See HPI  Physical Exam  General:  Well-developed,well-nourished,in no acute distress; alert,appropriate and cooperative throughout examination Lungs:  Normal respiratory effort, chest expands symmetrically. Lungs are clear to auscultation, no crackles or wheezes. Heart:  normal rate and no  murmur.   Psych:  Cognition and judgment appear intact. Alert and cooperative with normal attention span and concentration. No apparent delusions, illusions, hallucinations   Impression & Recommendations:  Problem # 1:  ANXIETY, SITUATIONAL (ICD-308.3) did not recommend meds for fear of roller coasters I recommended she not go on the rides that scare her and she bring someone who can go.  con't zoloft  Problem # 2:  RHINITIS (ICD-472.0)  Complete Medication List: 1)  Loestrin 24 Fe 1-20 Mg-mcg Tabs (Norethin ace-eth estrad-fe) .... Take 1 tab once daily 2)  Adderall Xr 30 Mg Xr24h-cap (Amphetamine-dextroamphetamine) .[name].. 1 by mouth qam 3)  Fluticasone Propionate 50 Mcg/act Susp (Fluticasone propionate) .[name].. 1-2 sprays each nostril once daily 4)  Zoloft 100 Mg Tabs (Sertraline hcl) .[name].. 1 by mouth once daily 5)  Iron 18 Mg Cr-tabs (Ferrous fumarate) .... Take 1 tab once daily 6)  Xyzal 5 Mg Tabs (Levocetirizine dihydrochloride) .[name].. 1 by mouth qpm Prescriptions: XYZAL 5 MG TABS (LEVOCETIRIZINE DIHYDROCHLORIDE) 1 by mouth qpm  #30 x 11   Entered and Authorized by:   Loreen Freud DO   Signed by:   Loreen Freud DO on 01/07/2010   Method used:   Electronically to        [id number]* (retail)       [address]       [place], [place]  [address]       Ph: [phone]       Fax: [phone]   RxID:   [id number] ZOLOFT 100 MG  TABS (SERTRALINE HCL) 1 by mouth once daily  #30 x 11   Entered and Authorized by:   Loreen Freud DO   Signed by:   Loreen Freud DO on 01/07/2010   Method used:   Electronically to        [id number]* (retail)       [address]       [place], [place]  [address]       Ph: [phone]       Fax: [phone]   RxID:   [id number]

## 2010-09-03 NOTE — Progress Notes (Signed)
Summary: RX  Phone Note Refill Request Call back at [phone] Message from:  Patient  Refills Requested: Medication #1:  ADDERALL XR 30 MG XR24H-CAP 1 by mouth qam   Dosage confirmed as above?Dosage Confirmed   Supply Requested: 1 month Initial call taken by: Freddy Jaksch,  June 03, 2010 3:35 PM  Follow-up for Phone Call        spk with pt and advised Adderall was not available. Stated she has not been on anything else and willing to try Ritalin.... Is it ok to Fill Ritalin if so which dose??? Follow-up by: Almeta Monas CMA Duncan Dull),  June 04, 2010 11:54 AM  Additional Follow-up for Phone Call Additional follow up Details #1::        ritalin la 20 mg #30 1 by mouth once daily  Additional Follow-up by: Loreen Freud DO,  June 04, 2010 12:02 PM    New/Updated Medications: RITALIN LA 20 MG XR24H-CAP (METHYLPHENIDATE HCL) 1 by mouth once daily Prescriptions: RITALIN LA 20 MG XR24H-CAP (METHYLPHENIDATE HCL) 1 by mouth once daily  #30 x 0   Entered by:   Almeta Monas CMA (AAMA)   Authorized by:   Loreen Freud DO   Signed by:   Almeta Monas CMA (AAMA) on 06/04/2010   Method used:   Print then Give to Patient   RxID:   [id number]

## 2010-09-05 NOTE — Progress Notes (Signed)
Summary: [place] Results 12/19  Phone Note Outgoing Call   Call placed by: Almeta Monas CMA Duncan Dull),  July 22, 2010 3:54 PM Call placed to: Patient Details for Reason: small nodule L side---I would need previous US to compare size Please ask pt to get a  copy of previous US sent to [place].    Summary of Call: Left message to call back.... Almeta Monas CMA Duncan Dull)  July 22, 2010 3:54 PM  spoke with patient and advised of the results, says she has never had an u/s in the past but was evaluated and scheduled for sugery but it was not done. Stated she had an ENT at [facility]. I advised the patient per Dr.Lowne she can f/u with them to see what they want to do...[place] Kitchen[place] faxed to [facility] attn Dr.Pincus @ [phone] and I advised them the patient would call to f/u with them.   Initial call taken by: Almeta Monas CMA Duncan Dull),  July 23, 2010 10:53 AM

## 2010-09-05 NOTE — Progress Notes (Signed)
Summary: adderal refill  Phone Note Refill Request Call back at Work Phone [phone] Message from:  Patient on August 06, 2010 9:26 AM  Refills Requested: Medication #1:  ADDERALL XR 30 MG XR24H-CAP 1 by mouth qam Patient needs Adderall prescription refilled.  Advised patient that it will be ready in 24 hours for pickup unless someone calls them  Next Appointment Scheduled: none Initial call taken by: Jerolyn Shin,  August 06, 2010 9:36 AM  Follow-up for Phone Call        pt aware Rx ready for pick up..... Almeta Monas CMA Duncan Dull)  August 06, 2010 9:43 AM     Prescriptions: ADDERALL XR 30 MG XR24H-CAP (AMPHETAMINE-DEXTROAMPHETAMINE) 1 by mouth qam  #30 x 0   Entered by:   Almeta Monas CMA (AAMA)   Authorized by:   Loreen Freud DO   Signed by:   Almeta Monas CMA (AAMA) on 08/06/2010   Method used:   Print then Give to Patient   RxID:   [id number]

## 2010-09-05 NOTE — Letter (Signed)
Summary: [facility]  [facility]   Imported By: Lanelle Bal 07/16/2010 09:19:54  _____________________________________________________________________  External Attachment:    Type:   Image     Comment:   External Document

## 2010-09-09 ENCOUNTER — Telehealth: Payer: Self-pay | Admitting: Family Medicine

## 2010-09-19 NOTE — Progress Notes (Signed)
Summary: ADDERALL RX REQUEST  Phone Note Call from Patient Call back at Work Phone [phone]   Caller: Patient Summary of Call: PATIENT CALLING TO REQUEST RX FOR REFILL OF ADDERALL 30MG  1X DAILY, LAST FILLED 08-06-2010, LAST OV 07-09-2010, PLEASE CALL PHONE NUMBER ABOVE WHEN READY FOR PICK-UP. Initial call taken by: Magdalen Spatz [facility],  September 09, 2010 10:02 AM    Prescriptions: ADDERALL XR 30 MG XR24H-CAP (AMPHETAMINE-DEXTROAMPHETAMINE) 1 by mouth qam  #30 x 0   Entered by:   Almeta Monas CMA (AAMA)   Authorized by:   Loreen Freud DO   Signed by:   Almeta Monas CMA (AAMA) on 09/09/2010   Method used:   Print then Give to Patient   RxID:   [id number]

## 2010-10-11 ENCOUNTER — Telehealth (INDEPENDENT_AMBULATORY_CARE_PROVIDER_SITE_OTHER): Payer: Self-pay | Admitting: *Deleted

## 2010-10-14 ENCOUNTER — Telehealth (INDEPENDENT_AMBULATORY_CARE_PROVIDER_SITE_OTHER): Payer: Self-pay | Admitting: *Deleted

## 2010-10-15 ENCOUNTER — Encounter: Payer: Self-pay | Admitting: Family Medicine

## 2010-10-15 NOTE — Progress Notes (Signed)
Summary: Refill  Phone Note Refill Request Call back at Work Phone [phone] Message from:  Patient on October 11, 2010 12:20 PM  Refills Requested: Medication #1:  ADDERALL XR 30 MG XR24H-CAP 1 by mouth qam  Method Requested: Pick up at Office Next Appointment Scheduled: No Future Appointments Initial call taken by: Barnie Mort,  October 11, 2010 12:20 PM  Follow-up for Phone Call        pt aware Rx ready for pick up.... Follow-up by: Almeta Monas CMA Duncan Dull),  October 11, 2010 3:26 PM    Prescriptions: ADDERALL XR 30 MG XR24H-CAP (AMPHETAMINE-DEXTROAMPHETAMINE) 1 by mouth qam  #30 x 0   Entered by:   Almeta Monas CMA (AAMA)   Authorized by:   Nolon Rod. Paz MD   Signed by:   Almeta Monas CMA (AAMA) on 10/11/2010   Method used:   Print then Give to Patient   RxID:   [id number]

## 2010-10-22 NOTE — Progress Notes (Signed)
Summary: Prior Auth APPROVED ADDERALL MEDCO  Phone Note Refill Request Call back at [phone] Message from:  Patient on October 14, 2010 2:41 PM  Refills Requested: Medication #1:  ADDERALL XR 30 MG XR24H-CAP 1 by mouth qam   Dosage confirmed as above?Dosage Confirmed   Supply Requested: 1 month PRIOR AUTH NEEDED: [phone] PATIENT ID- [id number]  Next Appointment Scheduled: NONE Initial call taken by: Lavell Islam,  October 14, 2010 2:42 PM  Follow-up for Phone Call        Awaiting fax..............Marland KitchenFelecia Deloach CMA  October 14, 2010 4:34 PM   PA faxed awaiting response..........Marland KitchenFelecia Deloach CMA  October 15, 2010 9:41 AM   Prior auth approved 09-24-10 until 10-15-11, pharmacy notified, approval letter scan to chart...Marland KitchenMarland KitchenFelecia Deloach CMA  October 15, 2010 3:56 PM

## 2010-10-31 NOTE — Medication Information (Signed)
Summary: PA and Approval for Dextroamphetamine-Amph  PA and Approval for Dextroamphetamine-Amph   Imported By: Maryln Gottron 10/21/2010 10:51:33  _____________________________________________________________________  External Attachment:    Type:   Image     Comment:   External Document

## 2010-11-13 ENCOUNTER — Telehealth: Payer: Self-pay | Admitting: Family Medicine

## 2010-11-13 MED ORDER — AMPHETAMINE-DEXTROAMPHET ER 30 MG PO CP24: 30.0000 mg | ORAL_CAPSULE | ORAL | Status: DC

## 2010-11-13 NOTE — Telephone Encounter (Signed)
Patient needs prescription for adderral---advised her it would be ready after 12:00 tomorrow unless we called her

## 2010-11-13 NOTE — Telephone Encounter (Signed)
Pt aware Rx ready for pick up in the morning    KP

## 2010-12-19 ENCOUNTER — Telehealth: Payer: Self-pay | Admitting: Family Medicine

## 2010-12-19 NOTE — Telephone Encounter (Signed)
rx adderall xr  30 mg--patient will pick up Friday [id number] i

## 2010-12-20 MED ORDER — AMPHETAMINE-DEXTROAMPHET ER 30 MG PO CP24: 30.0000 mg | ORAL_CAPSULE | ORAL | Status: DC

## 2010-12-20 NOTE — Discharge Summary (Signed)
[facility]  Patient:    Jillian Green, Jillian Green                   MRN: [id number] Adm. Date:  [id number] Disc. Date: [id number] Attending:  Marina Gravel B                           Discharge Summary  ADMISSION DIAGNOSES:          1. Intrauterine pregnancy at 38+ weeks.                               2. Spontaneous rupture of membranes.                               3. Deep transverse arrest and failure to                                  descend.  DISCHARGE DIAGNOSES:          1. Intrauterine pregnancy at 38 weeks,                                  delivered.                               2. Spontaneous rupture of membranes.                               3. Deep transverse arrest and failure to                                  descend.  PROCEDURE:                    1. Augmentation of labor with Pitocin.                               2. Primary low transverse cesarean section.  HISTORY OF PRESENT ILLNESS:   Patient is a 40 year old white female gravida 2, para 0, A1 who presented at 38 weeks with spontaneous rupture of membranes at 2300 on Dec 08, 2000.  She was evaluated in maternity admissions and confirmed to be ruptured.  Vaginal examination showed the cervix to be 5 cm dilated, 80% effaced, -1 station with vertex presentation.  On admission patient was contracting every three to five minutes.  HOSPITAL COURSE:              After admission patient had decrease in progress of labor and was augmented with Pitocin.  This allowed her to progress (although somewhat slowly) to complete complete +2/+3 position.  Patient pushed for two hours and 40 minutes and did not have further descent beyond +2/+3 station.  Right occiput transverse position was noted to be persistent. Given these findings, options were discussed including operative vaginal delivery versus primary cesarean section.  Patient chose primary cesarean section.  On Dec 09, 2000 patient was  delivered by a primary low transverse cesarean section  of a 7 pound 3 ounce viable female with Apgars of 8 and 9.  Right occiput transverse position was noted.  Other findings at surgery included normal uterus, tubes, and ovaries.  The operation was complicated by postpartum hemorrhage due to uterine atone.  This was treated with intravenous Pitocin, uterine massage, and Hemabate intrauterine x 2.  Estimated blood loss was approximately 1500 cc.  Postoperatively the patient rapidly regained her ability to ambulate, void, and tolerate a regular diet.  She was anemic with a hemoglobin of 8; however, she did not have any symptoms due to the anemia.  She was discharged home on the third postoperative day in satisfactory condition.  DISCHARGE MEDICATIONS:        1. Tylox one to two tablets q.4-6h. p.r.n. pain.                               2. Ferrous sulfate 325 mg p.o. t.i.d.  DISCHARGE INSTRUCTIONS:       Standard preprinted instructions were given prior to dismissal.  FOLLOW-UP:                    [facility] four weeks.  CONDITION ON DISCHARGE:       Satisfactory. DD:  12/26/00 TD:  12/26/00 Job: [id number]

## 2010-12-20 NOTE — Telephone Encounter (Signed)
RX printed for signature. 

## 2010-12-20 NOTE — Op Note (Signed)
[facility]  Patient:    Jillian Green, Jillian Green                   MRN: [id number] Proc. Date: 12/09/00 Adm. Date:  [id number] Attending:  Marina Gravel B                           Operative Report  PREOPERATIVE DIAGNOSIS:       1. Failure to descend.                               2. Transverse position.                               3. Maternal exhaustion.  POSTOPERATIVE DIAGNOSIS:      1. Failure to descend.                               2. Transverse position.                               3. Maternal exhaustion.  OPERATION:                    Primary low transverse cesarean section.  SURGEON:                      Marina Gravel, M.D.  ASSISTANT:                    Jamey Reas, M.D.  ANESTHESIA:                   Epidural.  FINDINGS:                     Viable female infant, ROT position, 7 pounds 3 ounces, Apgars 8/9, normal uterus, tubes, and ovaries.  COMPLICATIONS:                Postpartum hemorrhage due to uterine atony. This required Hemabate in two doses, also uterine size and Pitocin.  ESTIMATED BLOOD LOSS:         1500 cc.  FLUIDS:                       3000 cc.  URINE:                        50 cc.  SPECIMENS:                    None.  DISPOSITION:                  To recovery, stable.  DESCRIPTION OF PROCEDURE:     The patient was taken to the operating room with epidural anesthesia in place.  She was prepped and draped in the standard fashion.  The intended line of the incision was infiltrated with Marcaine 10 cc.  A Pfannenstiel incision was made with a knife.  The incision was carried sharply to the underlying fascia.  The fascia was divided in the midline and extended laterally with Mayo scissors. Several bleeders in the skin were made hemostatic with Bovie cautery.  The  superior aspect of the incision was elevated with Kocher clamps, and the underlying rectus muscles were dissected off sharply and repeated  inferiorly in a similar fashion.  The midline of the rectus muscle was identified, the underlying peritoneum elevated and entered sharply with Metzenbaum scissors.  Extended superiorly and inferiorly with good visualization of the surrounding organs.  The bladder blade was inserted.  The vesicouterine peritoneum identified, elevated, and bladder flap created with sharp and blunt technique.  The bladder blade was reinserted, and the uterine incision was made in low transverse fashion with the knife.  An assistants hand was required from below the elevate the vertex to facilitate delivery.  The infants head was then delivered without difficulty.  The nose and mouth suctioned with the bulb, and the remainder of the infant delivered without difficulty.  The cord was clamped and cut and the infant handed off to the awaiting pediatricians. Cefotan 2 g was given at cord clamp.  The placenta was removed manually and uterus exteriorized.  This was noted to be markedly atonic, and postpartum hemorrhage was encountered.  This was initially treated with uterine massage and increasing Pitocin to 40 units in liter.  However, these measures did not improve the atony.  Therefore, Hemabate was given.  Two doses were required.  This was administered intramuscularly into the uterus.  After the above measures, the atony resolved.  The uterine incision was closed in running locked stitches of 0 Monocryl.  A second layer was placed in an imbricating fashion.  Hemostasis was obtained.  The uterus was reinserted into the abdomen.  The pelvis was irrigated.  The uterine incision and bladder flap were inspected and were hemostatic.   The subfascial space was inspected and was hemostatic as well.  The fascia was closed with running stitch of 0 Vicryl.  The subcutaneous tissue was irrigated and made hemostatic with Bovie.  The skin was closed with staples.  The patient tolerated the procedure well, and there  were no complications. She was taken to the recovery room awake, alert, and in stable condition. DD:  12/09/00 TD:  12/09/00 Job: [id number]

## 2011-01-21 ENCOUNTER — Other Ambulatory Visit: Payer: Self-pay | Admitting: Family Medicine

## 2011-01-21 MED ORDER — AMPHETAMINE-DEXTROAMPHET ER 30 MG PO CP24: 30.0000 mg | ORAL_CAPSULE | ORAL | Status: DC

## 2011-01-21 NOTE — Telephone Encounter (Signed)
Left Pt detail message Rx ready for pick up and 6 month f/u due prior to next refill. Note Placed on Rx as well.

## 2011-02-20 ENCOUNTER — Encounter: Payer: Self-pay | Admitting: Family Medicine

## 2011-02-21 ENCOUNTER — Ambulatory Visit (INDEPENDENT_AMBULATORY_CARE_PROVIDER_SITE_OTHER): Payer: BC Managed Care – PPO | Admitting: Family Medicine

## 2011-02-21 ENCOUNTER — Encounter: Payer: Self-pay | Admitting: Family Medicine

## 2011-02-21 VITALS — BP 108/62 | HR 86 | Temp 97.6°F | Wt 152.8 lb

## 2011-02-21 DIAGNOSIS — F329 Major depressive disorder, single episode, unspecified: Secondary | ICD-10-CM

## 2011-02-21 DIAGNOSIS — E663 Overweight: Secondary | ICD-10-CM | POA: Insufficient documentation

## 2011-02-21 DIAGNOSIS — F3289 Other specified depressive episodes: Secondary | ICD-10-CM

## 2011-02-21 DIAGNOSIS — F988 Other specified behavioral and emotional disorders with onset usually occurring in childhood and adolescence: Secondary | ICD-10-CM

## 2011-02-21 MED ORDER — SERTRALINE HCL 100 MG PO TABS: 100.0000 mg | ORAL_TABLET | Freq: Every day | ORAL | Status: DC

## 2011-02-21 MED ORDER — AMPHETAMINE-DEXTROAMPHET ER 30 MG PO CP24: 30.0000 mg | ORAL_CAPSULE | ORAL | Status: DC

## 2011-02-21 NOTE — Patient Instructions (Addendum)
Calorie Counting Diet A calorie counting diet requires you to eat the number of calories that are right for you during a day. Calories are the measurement of how much energy you get from the food you eat. Eating the right amount of calories is important for staying at a healthy weight. If you eat too many calories your body will store them as fat and you may gain weight. If you eat too few calories you may lose weight. Counting the number of calories that you eat during a day will help you to know if you're eating the right amount. A Registered Dietitian can determine how many calories you need in a day. The amount of calories you need varies from person to person. If your goal is to lose weight you will need to eat fewer calories. Losing weight can benefit you if you are overweight or have health problems such as heart disease, high blood pressure or diabetes. If your goal is to gain weight, you will need to eat more calories. Gaining weight may be necessary if you have a certain health problem that causes your body to need more energy. TIPS Whether you are increasing or decreasing the number of calories you eat during a day, it may be hard to get used to changing what you eat and drink. The following are tips to help you keep track of the number of calories you are eating.  Measuring foods at home with measuring cups will help you to know the actual amount of food and number of calories you are eating.   Restaurants serve food in all different portion sizes. It is common that restaurants will serve food in amounts worth 2 or more serving sizes. While eating out, it may be helpful to estimate how many servings of a food you are given. For example, a serving of cooked rice is 1/2 cup and that is the size of half of a fist. Knowing serving sizes will help you have a better idea of how much food you are eating at restaurants.   Ask for smaller portion sizes or child-size portions at restaurants.   Plan to  eat half of a meal at a restaurant and take the rest home or share the other half with a friend   Read food labels for calorie content and serving size   Most packaged food has a Nutrition Facts Panel on its side or back. Here you can find out how many servings are in a package, the size of a serving, and the number of calories each serving has.   The serving size and number of servings per container are listed right below the Nutrition Facts heading. Just below the serving information, the number of calories in each serving is listed.   For example, say that a package has three cookies inside. The Nutrition Facts panel says that one serving is one cookie. Below that, it says that there are three servings in the container. The calories section of the Nutrition Facts says there are 90 calories. That means that there are 90 calories in one cookie. If you eat one cookie you have eaten 90 calories. If you eat all three cookies, you have eaten three times that amount, or 270 calories.  The list below tells you how big or small some common portion sizes are.  1 ounce (oz).................4 stacked dice.   3 oz.............................Marland KitchenDeck of cards.   1 teaspoon (tsp)..........Marland KitchenTip of little finger.   1 tablespoon (Tbsp).Marland KitchenMarland KitchenMarland KitchenTip of thumb.  2 Tbsp.........................Marland KitchenGolf ball.    Cup.........................Marland KitchenHalf of a fist.   1 Cup..........................Marland KitchenA fist.  KEEP A FOOD LOG Write down every food item that you eat, how much of the food you eat, and the number of calories in each food that you eat during the day. At the end of the day or throughout the day you can add up the total number of calories you have eaten.  It may help to set up a list like the one below. Find out the calorie information by reading food labels.  Breakfast   Bran Flakes (1 cup, 110 calories).   Fat free milk ( cup, 45 calories).   Snack   Apple (1 medium, 80 calories).   Lunch   Spinach (1  cup, 20 calories).   Tomato ( medium, 20 calories).   Chicken breast strips (3 oz, 165 calories).   Shredded cheddar cheese ( cup, 110 calories).   Light [place] dressing (2 Tbsp, 60 calories).   Whole wheat bread (1 slice, 80 calories).   Tub margarine (1 tsp, 35 calories).   Vegetable soup (1 cup, 160 calories).   Dinner   Pork chop (3 oz, 190 calories).   Brown rice (1 cup, 215 calories).   Steamed broccoli ( cup, 20 calories).   Strawberries (1  cup, 65 calories).   Whipped cream (1 Tbsp, 50 calories).  Daily Calorie Total: 1425 Information from www.eatright.org, Foodwise Nutritional Analysis Database. Document Released: 07/21/2005 Document Re-Released: 08/12/2009 [facility] Patient Information 2011 [place], [place].  Attention Deficit-Hyperactivity Disorder ADHD Attention deficit-hyperactivity disorder (ADHD) is a problem with behavior issues based on the way the brain functions (neurobehavioral disorder). It is a common reason for behavior and academic problems in school. CAUSES The cause of ADHD is unknown in most cases. It may run in families. It sometimes can be associated with learning disabilities and other behavioral problems. SYMPTOMS There are three types of ADHD. Some of the symptoms include:  Inattentive   Gets bored or distracted easily   Loses or forgets things. Forgets to hand in homework.   Has trouble organizing or completing tasks.   Difficulty staying on task.   An inability to organize daily tasks and school work.   Leaving projects, chores and homework unfinished.   Trouble paying attention or responding to details. Careless mistakes.   Difficulty following directions. Often seems like is not listening.   Dislikes activities that require sustained attention (like chores or homework).   Hyperactive-impulsive   Feels like it is impossible to sit still or stay in a seat. Fidgeting with hands and feet.   Trouble waiting turn.    Talking too much or out of turn. Interruptive.   Speaks or acts impulsively   Aggressive, disruptive behavior   Constantly busy or on the go, noisy.   Combined   Has symptoms of both of the above.  Often children with ADHD feel discouraged about themselves and with school. They often perform well below their abilities in school. These symptoms can cause problems in home, school, and in relationships with peers. As children get older, the excess motor activities can calm down, but the problems with paying attention and staying organized persist. Most children do not outgrow ADHD but with good treatment can learn to cope with the symptoms. DIAGNOSIS When ADHD is suspected, the diagnosis should be made by professionals trained in ADHD.  Diagnosis will include:  Ruling out other reasons for the child's behavior.   The caregivers will check with the  child's school and check their medical records.   They will talk to teachers and parents.   Behavior rating scales for the child will be filled out by those dealing with the child on a daily basis.  A diagnosis is made only after all information has been considered. TREATMENT Treatment usually includes behavioral treatment often along with medicines. It may include stimulant medicines. The stimulant medicines decrease impulsivity and hyperactivity and increase attention. Other medicines used include antidepressants and certain blood pressure medicines. Most experts agree that treatment for ADHD should address all aspects of the child's functioning. Treatment should not be limited to the use of medicines alone. Treatment should include structured classroom management. The parents must receive education to address rewarding good behavior, discipline and limit-setting. Tutoring and/or behavioral therapy should be available for the child. If untreated, the disorder can have long term serious effects into adolescence and adulthood. HOMECARE  INSTRUCTIONS   Often with ADHD there is a lot of frustration among the family in dealing with the illness. There is often blame and anger that is not warranted. This is a life long illness. There is no way to prevent ADHD. In many cases, because the problem affects the family as a whole, the entire family may need help. A therapist can help the family find better ways to handle the disruptive behaviors and promote change. If the child is young, most of the therapist's work is with the parents. Parents will learn techniques for coping with and improving their child's behavior. Sometimes only the child with the ADHD needs counseling. Your caregivers can help you make these decisions.   Children with ADHD may need help in organizing. Here are some helpful tips:   Keep routines the same every day from wake-up time to bedtime. Schedule everything. This includes homework and playtime. This should include outdoor and indoor recreation. Keep the schedule on the refrigerator or a bulletin board where it is frequently seen. Mark schedule changes as far in advance as possible.   Have a place for everything and keep everything in its place. This includes clothing, backpacks, and school supplies.   Encourage writing down assignments and bringing home needed books.   Offer your child a well-balanced diet. Breakfast is especially important for school performance. Children should avoid drinks with caffeine including:   Soft drinks.   Coffee.   Tea.   However, some older children (adolescents) may find these drinks helpful in improving their attention.   Children with ADHD need consistent rules that they can understand and follow. If rules are followed, give small rewards. Children with ADHD often receive, and expect, criticism. Look for good behavior and praise it. Set realistic goals. Give clear instructions. Look for activities that can foster success and self-esteem. Make time for pleasant activities with  your child. Give lots of affection.   Parents are their children's greatest advocates. Learn as much as possible about ADHD. This helps you become a stronger and better advocate for your child. It also helps you educate your child's teachers and instructors if they feel inadequate in these areas. Parent support groups are often helpful. A national group with local chapters is called CHADD (Children and Adults with Attention Deficit/Hyperactivity Disorder).  PROGNOSIS  There is no cure for ADHD. Children with the disorder seldom outgrow it. Many find adaptive ways to accommodate the ADHD as they mature. SEEK MEDICAL CARE IF YOUR CHILD HAS:  Repeated muscle twitches, cough or speech outbursts.   Sleep problems.  Marked loss of appetite.   Depression.   New or worsening behavioral problems.   Dizziness.   Racing heart.   Stomach pains.   Headaches.  Document Released: 07/11/2002 Document Re-Released: 04/29/2008 [facility] Patient Information 2011 [place], [place].

## 2011-02-21 NOTE — Progress Notes (Signed)
  Subjective:    Patient ID: Jillian Green, female    DOB: [birth year], 40 y.o.   MRN: [id number]  HPI Pt here to f/u add and depression.  She is doing well ---no complaints with meds.   Pt also c/o still struggling with losing weight.  She tried a modified Counsellor Diet with poor results---she is walking everyday but nothing else.    Review of Systems    as above Objective:   Physical Exam  Constitutional: She is oriented to person, place, and time. She appears well-developed and well-nourished.  Neck: Normal range of motion. Neck supple.  Cardiovascular: Normal rate, regular rhythm and normal heart sounds.   No murmur heard. Pulmonary/Chest: Effort normal and breath sounds normal. No respiratory distress. She has no wheezes. She has no rales. She exhibits no tenderness.  Musculoskeletal: Normal range of motion. She exhibits no edema.  Neurological: She is alert and oriented to person, place, and time.  Skin: Skin is dry.  Psychiatric: She has a normal mood and affect. Her behavior is normal. Judgment and thought content normal.          Assessment & Plan:

## 2011-02-21 NOTE — Assessment & Plan Note (Signed)
Refill meds rto 6 months 

## 2011-02-21 NOTE — Assessment & Plan Note (Signed)
D/w pt talking to trainers at the gym about exercise program and talking to someone about going over her diet as well

## 2011-02-21 NOTE — Assessment & Plan Note (Signed)
con't zoloft Doing well

## 2011-06-10 ENCOUNTER — Telehealth: Payer: Self-pay | Admitting: Family Medicine

## 2011-06-10 DIAGNOSIS — F988 Other specified behavioral and emotional disorders with onset usually occurring in childhood and adolescence: Secondary | ICD-10-CM

## 2011-06-10 MED ORDER — AMPHETAMINE-DEXTROAMPHET ER 30 MG PO CP24: 30.0000 mg | ORAL_CAPSULE | ORAL | Status: DC

## 2011-06-10 NOTE — Telephone Encounter (Signed)
Refill adderall - patient will pick up Thursday [phone]

## 2011-06-10 NOTE — Telephone Encounter (Signed)
Discussed with patient and she requested a 3 mo supply..prescription for printed and left at check in for pick up      KP

## 2011-08-04 ENCOUNTER — Other Ambulatory Visit: Payer: Self-pay | Admitting: Family Medicine

## 2011-09-12 ENCOUNTER — Ambulatory Visit (INDEPENDENT_AMBULATORY_CARE_PROVIDER_SITE_OTHER): Payer: BC Managed Care – PPO | Admitting: Family Medicine

## 2011-09-12 ENCOUNTER — Encounter: Payer: Self-pay | Admitting: Family Medicine

## 2011-09-12 VITALS — BP 120/78 | HR 101 | Temp 99.1°F | Wt 162.6 lb

## 2011-09-12 DIAGNOSIS — F329 Major depressive disorder, single episode, unspecified: Secondary | ICD-10-CM

## 2011-09-12 DIAGNOSIS — F988 Other specified behavioral and emotional disorders with onset usually occurring in childhood and adolescence: Secondary | ICD-10-CM

## 2011-09-12 DIAGNOSIS — F3289 Other specified depressive episodes: Secondary | ICD-10-CM

## 2011-09-12 MED ORDER — AMPHETAMINE-DEXTROAMPHET ER 20 MG PO CP24: ORAL_CAPSULE | ORAL | Status: DC

## 2011-09-12 MED ORDER — SERTRALINE HCL 100 MG PO TABS: 100.0000 mg | ORAL_TABLET | Freq: Every day | ORAL | Status: DC

## 2011-09-12 NOTE — Assessment & Plan Note (Signed)
Stable con't zoloft 

## 2011-09-12 NOTE — Patient Instructions (Signed)
Attention Deficit Hyperactivity Disorder Attention deficit hyperactivity disorder (ADHD) is a problem with behavior issues based on the way the brain functions (neurobehavioral disorder). It is a common reason for behavior and academic problems in school. CAUSES  The cause of ADHD is unknown in most cases. It may run in families. It sometimes can be associated with learning disabilities and other behavioral problems. SYMPTOMS  There are 3 types of ADHD. The 3 types and some of the symptoms include:  Inattentive   Gets bored or distracted easily.   Loses or forgets things. Forgets to hand in homework.   Has trouble organizing or completing tasks.   Difficulty staying on task.   An inability to organize daily tasks and school work.   Leaving projects, chores, or homework unfinished.   Trouble paying attention or responding to details. Careless mistakes.   Difficulty following directions. Often seems like is not listening.   Dislikes activities that require sustained attention (like chores or homework).   Hyperactive-impulsive   Feels like it is impossible to sit still or stay in a seat. Fidgeting with hands and feet.   Trouble waiting turn.   Talking too much or out of turn. Interruptive.   Speaks or acts impulsively.   Aggressive, disruptive behavior.   Constantly busy or on the go, noisy.   Combined   Has symptoms of both of the above.  Often children with ADHD feel discouraged about themselves and with school. They often perform well below their abilities in school. These symptoms can cause problems in home, school, and in relationships with peers. As children get older, the excess motor activities can calm down, but the problems with paying attention and staying organized persist. Most children do not outgrow ADHD but with good treatment can learn to cope with the symptoms. DIAGNOSIS  When ADHD is suspected, the diagnosis should be made by professionals trained in  ADHD.  Diagnosis will include:  Ruling out other reasons for the child's behavior.   The caregivers will check with the child's school and check their medical records.   They will talk to teachers and parents.   Behavior rating scales for the child will be filled out by those dealing with the child on a daily basis.  A diagnosis is made only after all information has been considered. TREATMENT  Treatment usually includes behavioral treatment often along with medicines. It may include stimulant medicines. The stimulant medicines decrease impulsivity and hyperactivity and increase attention. Other medicines used include antidepressants and certain blood pressure medicines. Most experts agree that treatment for ADHD should address all aspects of the child's functioning. Treatment should not be limited to the use of medicines alone. Treatment should include structured classroom management. The parents must receive education to address rewarding good behavior, discipline, and limit-setting. Tutoring or behavioral therapy or both should be available for the child. If untreated, the disorder can have long-term serious effects into adolescence and adulthood. HOME CARE INSTRUCTIONS   Often with ADHD there is a lot of frustration among the family in dealing with the illness. There is often blame and anger that is not warranted. This is a life long illness. There is no way to prevent ADHD. In many cases, because the problem affects the family as a whole, the entire family may need help. A therapist can help the family find better ways to handle the disruptive behaviors and promote change. If the child is young, most of the therapist's work is with the parents. Parents will   learn techniques for coping with and improving their child's behavior. Sometimes only the child with the ADHD needs counseling. Your caregivers can help you make these decisions.   Children with ADHD may need help in organizing. Some  helpful tips include:   Keep routines the same every day from wake-up time to bedtime. Schedule everything. This includes homework and playtime. This should include outdoor and indoor recreation. Keep the schedule on the refrigerator or a bulletin board where it is frequently seen. Mark schedule changes as far in advance as possible.   Have a place for everything and keep everything in its place. This includes clothing, backpacks, and school supplies.   Encourage writing down assignments and bringing home needed books.   Offer your child a well-balanced diet. Breakfast is especially important for school performance. Children should avoid drinks with caffeine including:   Soft drinks.   Coffee.   Tea.   However, some older children (adolescents) may find these drinks helpful in improving their attention.   Children with ADHD need consistent rules that they can understand and follow. If rules are followed, give small rewards. Children with ADHD often receive, and expect, criticism. Look for good behavior and praise it. Set realistic goals. Give clear instructions. Look for activities that can foster success and self-esteem. Make time for pleasant activities with your child. Give lots of affection.   Parents are their children's greatest advocates. Learn as much as possible about ADHD. This helps you become a stronger and better advocate for your child. It also helps you educate your child's teachers and instructors if they feel inadequate in these areas. Parent support groups are often helpful. A national group with local chapters is called CHADD (Children and Adults with Attention Deficit Hyperactivity Disorder).  PROGNOSIS  There is no cure for ADHD. Children with the disorder seldom outgrow it. Many find adaptive ways to accommodate the ADHD as they mature. SEEK MEDICAL CARE IF:  Your child has repeated muscle twitches, cough or speech outbursts.   Your child has sleep problems.   Your  child has a marked loss of appetite.   Your child develops depression.   Your child has new or worsening behavioral problems.   Your child develops dizziness.   Your child has a racing heart.   Your child has stomach pains.   Your child develops headaches.  Document Released: 07/11/2002 Document Revised: 04/02/2011 Document Reviewed: 02/21/2008 [employer] Patient Information 2012 [employer]. 

## 2011-09-12 NOTE — Progress Notes (Signed)
  Subjective:    Patient ID: Jillian Green, female    DOB: 1971-02-01, 41 y.o.   MRN: [id number]  HPI Pt here f/u add.  Pt has started grad school and the adderall is wearing off about 4pm ---she starts classes at 430p and is in class for 3 hours.  No other complaints.    Review of Systems As above    Objective:   Physical Exam  Constitutional: She is oriented to person, place, and time. She appears well-developed and well-nourished.  Cardiovascular: Normal rate and normal heart sounds.   No murmur heard. Pulmonary/Chest: Effort normal and breath sounds normal. No respiratory distress. She has no wheezes. She has no rales.  Neurological: She is alert and oriented to person, place, and time.  Psychiatric: She has a normal mood and affect. Her behavior is normal. Thought content normal.          Assessment & Plan:

## 2011-09-12 NOTE — Assessment & Plan Note (Signed)
Increase to adderall xr  20 mg 2 po qd  rto 6 months or sooner prn

## 2011-10-10 ENCOUNTER — Telehealth: Payer: Self-pay | Admitting: Family Medicine

## 2011-10-10 DIAGNOSIS — F988 Other specified behavioral and emotional disorders with onset usually occurring in childhood and adolescence: Secondary | ICD-10-CM

## 2011-10-10 MED ORDER — AMPHETAMINE-DEXTROAMPHET ER 20 MG PO CP24: ORAL_CAPSULE | ORAL | Status: DC

## 2011-10-10 NOTE — Telephone Encounter (Signed)
VM left advising patient Rx ready for pick up.     KP 

## 2011-10-10 NOTE — Telephone Encounter (Signed)
Patient is requesting rx for Adderall. Call when ready to pick up.

## 2011-10-17 ENCOUNTER — Telehealth: Payer: Self-pay | Admitting: Family Medicine

## 2011-10-17 NOTE — Telephone Encounter (Signed)
Wrong message screen open

## 2011-11-19 ENCOUNTER — Other Ambulatory Visit: Payer: Self-pay | Admitting: *Deleted

## 2011-11-19 DIAGNOSIS — F988 Other specified behavioral and emotional disorders with onset usually occurring in childhood and adolescence: Secondary | ICD-10-CM

## 2011-11-19 MED ORDER — AMPHETAMINE-DEXTROAMPHET ER 20 MG PO CP24: ORAL_CAPSULE | ORAL | Status: DC

## 2011-11-19 NOTE — Telephone Encounter (Signed)
Pt aware Rx ready for pick up 

## 2011-12-18 ENCOUNTER — Other Ambulatory Visit: Payer: Self-pay | Admitting: *Deleted

## 2011-12-18 DIAGNOSIS — F988 Other specified behavioral and emotional disorders with onset usually occurring in childhood and adolescence: Secondary | ICD-10-CM

## 2011-12-18 MED ORDER — AMPHETAMINE-DEXTROAMPHET ER 20 MG PO CP24: ORAL_CAPSULE | ORAL | Status: DC

## 2011-12-18 NOTE — Telephone Encounter (Signed)
Left Pt detail message Rx ready for pickup 

## 2012-01-22 ENCOUNTER — Other Ambulatory Visit: Payer: Self-pay | Admitting: *Deleted

## 2012-01-22 DIAGNOSIS — F988 Other specified behavioral and emotional disorders with onset usually occurring in childhood and adolescence: Secondary | ICD-10-CM

## 2012-01-22 MED ORDER — AMPHETAMINE-DEXTROAMPHET ER 20 MG PO CP24: ORAL_CAPSULE | ORAL | Status: DC

## 2012-01-22 NOTE — Telephone Encounter (Signed)
Pt aware Rx ready for pick up 

## 2012-02-13 ENCOUNTER — Ambulatory Visit (INDEPENDENT_AMBULATORY_CARE_PROVIDER_SITE_OTHER): Payer: BC Managed Care – PPO | Admitting: Family Medicine

## 2012-02-13 ENCOUNTER — Encounter: Payer: Self-pay | Admitting: Family Medicine

## 2012-02-13 VITALS — BP 120/76 | HR 94 | Temp 98.5°F | Ht 65.0 in | Wt 161.4 lb

## 2012-02-13 DIAGNOSIS — Z23 Encounter for immunization: Secondary | ICD-10-CM

## 2012-02-13 DIAGNOSIS — Z0000 Encounter for general adult medical examination without abnormal findings: Secondary | ICD-10-CM

## 2012-02-13 DIAGNOSIS — Z111 Encounter for screening for respiratory tuberculosis: Secondary | ICD-10-CM

## 2012-02-13 NOTE — Patient Instructions (Signed)
Hepatitis A Vaccine, Inactivated; Hepatitis B Vaccine, Recombinant injection What is this medicine? HEPATITIS A VACCINE; HEPATITIS B VACCINE (hep uh TAHY tis A vak SEEN; hep uh TAHY tis B vak SEEN) is a vaccine to protect from an infection with the hepatitis A and B virus. This vaccine does not contain the live viruses. It will not cause a hepatitis infection. This medicine may be used for other purposes; ask your health care provider or pharmacist if you have questions. What should I tell my health care provider before I take this medicine? They need to know if you have any of these conditions: -bleeding disorder -fever or infection -heart disease -immune system problems -an unusual or allergic reaction to hepatitis A or B vaccine, neomycin, yeast, thimerosal, other medicines, foods, dyes, or preservatives -pregnant or trying to get pregnant -breast-feeding How should I use this medicine? This vaccine is for injection into a muscle. It is given by a health care professional. A copy of Vaccine Information Statements will be given before each vaccination. Read this sheet carefully each time. The sheet may change frequently. Talk to your pediatrician regarding the use of this medicine in children. Special care may be needed. Overdosage: If you think you have taken too much of this medicine contact a poison control center or emergency room at once. NOTE: This medicine is only for you. Do not share this medicine with others. What if I miss a dose? It is important not to miss your dose. Call your doctor or health care professional if you are unable to keep an appointment. What may interact with this medicine? -medicines that suppress your immune function like adalimumab, anakinra, infliximab -medicines to treat cancer -steroid medicines like prednisone or cortisone This list may not describe all possible interactions. Give your health care provider a list of all the medicines, herbs,  non-prescription drugs, or dietary supplements you use. Also tell them if you smoke, drink alcohol, or use illegal drugs. Some items may interact with your medicine. What should I watch for while using this medicine? See your health care provider for all shots of this vaccine as directed. You must have 3 to 4 shots of this vaccine for protection from hepatitis A and B infection. Tell your doctor right away if you have any serious or unusual side effects after getting this vaccine. What side effects may I notice from receiving this medicine? Side effects that you should report to your doctor or health care professional as soon as possible: -allergic reactions like skin rash, itching or hives, swelling of the face, lips, or tongue -breathing problems -confused, irritated -fast, irregular heartbeat -flu-like syndrome -numb, tingling pain -seizures Side effects that usually do not require medical attention (report to your doctor or health care professional if they continue or are bothersome): -diarrhea -fever -headache -loss of appetite -muscle pain -nausea -pain, redness, swelling, or irritation at site where injected -tiredness This list may not describe all possible side effects. Call your doctor for medical advice about side effects. You may report side effects to FDA at 1-800-FDA-1088. Where should I keep my medicine? This drug is given in a hospital or clinic and will not be stored at home. NOTE: This sheet is a summary. It may not cover all possible information. If you have questions about this medicine, talk to your doctor, pharmacist, or health care provider.  2012, Elsevier/[employer] Standard. (12/03/2007 3:21:37 PM)

## 2012-02-15 NOTE — Progress Notes (Signed)
  Subjective:    Patient ID: Jillian Green, female    DOB: 1970-09-04, 41 y.o.   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   No complaints.   Review of Systems    Review of Systems  Constitutional: Negative for activity change, appetite change and fatigue.  HENT: Negative for hearing loss, congestion, tinnitus and ear discharge.  dentist q35m Eyes: Negative for visual disturbance (see optho q1y -- vision corrected to 20/20 with glasses).  Respiratory: Negative for cough, chest tightness and shortness of breath.   Cardiovascular: Negative for chest pain, palpitations and leg swelling.  Gastrointestinal: Negative for abdominal pain, diarrhea, constipation and abdominal distention.  Genitourinary: Negative for urgency, frequency, decreased urine volume and difficulty urinating.  Musculoskeletal: Negative for back pain, arthralgias and gait problem.  Skin: Negative for color change, pallor and rash.  Neurological: Negative for dizziness, light-headedness, numbness and headaches.  Hematological: Negative for adenopathy. Does not bruise/bleed easily.  Psychiatric/Behavioral: Negative for suicidal ideas, confusion, sleep disturbance, self-injury, dysphoric mood, decreased concentration and agitation.      Objective:   Physical Exam  Constitutional: She is oriented to person, place, and time. She appears well-developed and well-nourished.  HENT:  Head: Normocephalic and atraumatic.  Right Ear: External ear normal.  Left Ear: External ear normal.  Nose: Nose normal.  Mouth/Throat: Oropharynx is clear and moist.  Eyes: Conjunctivae and EOM are normal. Pupils are equal, round, and reactive to light. Right eye exhibits no discharge. Left eye exhibits no discharge.  Neck: Normal range of motion. Neck supple.  Cardiovascular: Normal rate, regular rhythm and normal heart sounds.   No murmur heard. Pulmonary/Chest: Effort normal and breath sounds normal. No  respiratory distress. She has no wheezes. She has no rales. She exhibits no tenderness.  Abdominal: Soft. Bowel sounds are normal. She exhibits no distension and no mass. There is no tenderness. There is no rebound and no guarding.  Musculoskeletal: Normal range of motion. She exhibits no edema and no tenderness.  Lymphadenopathy:    She has no cervical adenopathy.  Neurological: She is alert and oriented to person, place, and time.  Skin: Skin is warm and dry. No rash noted. No erythema. No pallor.  Psychiatric: She has a normal mood and affect. Her behavior is normal. Judgment and thought content normal.          Assessment & Plan:  cpe --- school form filled out--- rto 3 days to read PPD           Hep AB given          Labs refused by pt as well as pap

## 2012-02-20 ENCOUNTER — Other Ambulatory Visit: Payer: Self-pay | Admitting: *Deleted

## 2012-02-20 DIAGNOSIS — F988 Other specified behavioral and emotional disorders with onset usually occurring in childhood and adolescence: Secondary | ICD-10-CM

## 2012-02-20 MED ORDER — AMPHETAMINE-DEXTROAMPHET ER 20 MG PO CP24: ORAL_CAPSULE | ORAL | Status: DC

## 2012-02-20 NOTE — Telephone Encounter (Signed)
Rx ready for pick up, pt aware 

## 2012-02-21 ENCOUNTER — Other Ambulatory Visit: Payer: Self-pay | Admitting: Family Medicine

## 2012-03-24 ENCOUNTER — Telehealth: Payer: Self-pay | Admitting: *Deleted

## 2012-03-24 DIAGNOSIS — F988 Other specified behavioral and emotional disorders with onset usually occurring in childhood and adolescence: Secondary | ICD-10-CM

## 2012-03-24 NOTE — Telephone Encounter (Signed)
Pt called requesting a refill for adderall xr 20mg . OK to refill?

## 2012-03-24 NOTE — Telephone Encounter (Signed)
yes

## 2012-03-25 MED ORDER — AMPHETAMINE-DEXTROAMPHET ER 20 MG PO CP24: ORAL_CAPSULE | ORAL | Status: DC

## 2012-03-25 NOTE — Telephone Encounter (Signed)
Msg left advising Rx ready for pick up.      KP 

## 2012-04-15 ENCOUNTER — Ambulatory Visit: Payer: BC Managed Care – PPO

## 2012-04-20 ENCOUNTER — Other Ambulatory Visit: Payer: Self-pay | Admitting: Family Medicine

## 2012-04-20 DIAGNOSIS — F988 Other specified behavioral and emotional disorders with onset usually occurring in childhood and adolescence: Secondary | ICD-10-CM

## 2012-04-20 MED ORDER — AMPHETAMINE-DEXTROAMPHET ER 20 MG PO CP24: ORAL_CAPSULE | ORAL | Status: DC

## 2012-04-20 NOTE — Telephone Encounter (Signed)
Patient aware RX ready for pick up.       KP 

## 2012-04-20 NOTE — Telephone Encounter (Signed)
Refill adderall called in by patient NOTE pt did cancel 2 & 3 HEP AB injection due to her work schedule wt/a new job Cb# [phone] Last ov V70 7.12.13

## 2012-04-21 ENCOUNTER — Ambulatory Visit: Payer: BC Managed Care – PPO

## 2012-05-21 ENCOUNTER — Other Ambulatory Visit: Payer: Self-pay

## 2012-05-21 DIAGNOSIS — F988 Other specified behavioral and emotional disorders with onset usually occurring in childhood and adolescence: Secondary | ICD-10-CM

## 2012-05-21 MED ORDER — AMPHETAMINE-DEXTROAMPHET ER 20 MG PO CP24: ORAL_CAPSULE | ORAL | Status: DC

## 2012-05-21 NOTE — Telephone Encounter (Signed)
Last filled 04/20/12 #60 no refills. OV 02/13/12. Plz advise   MW

## 2012-05-21 NOTE — Telephone Encounter (Signed)
Called pt to advise Rx ready to be picked up from office.       MW

## 2012-06-25 ENCOUNTER — Telehealth: Payer: Self-pay | Admitting: Family Medicine

## 2012-06-25 DIAGNOSIS — F988 Other specified behavioral and emotional disorders with onset usually occurring in childhood and adolescence: Secondary | ICD-10-CM

## 2012-06-25 MED ORDER — AMPHETAMINE-DEXTROAMPHET ER 20 MG PO CP24: ORAL_CAPSULE | ORAL | Status: DC

## 2012-06-25 NOTE — Telephone Encounter (Signed)
Patient aware Rx ready for pick up.      KP 

## 2012-06-25 NOTE — Telephone Encounter (Signed)
Pt called requesting rx for adderall. Call [phone] when ready.

## 2012-08-02 ENCOUNTER — Other Ambulatory Visit: Payer: Self-pay | Admitting: *Deleted

## 2012-08-02 DIAGNOSIS — F988 Other specified behavioral and emotional disorders with onset usually occurring in childhood and adolescence: Secondary | ICD-10-CM

## 2012-08-02 MED ORDER — AMPHETAMINE-DEXTROAMPHET ER 20 MG PO CP24: ORAL_CAPSULE | ORAL | Status: DC

## 2012-08-02 NOTE — Telephone Encounter (Signed)
Patient aware Rx ready for pick up.      KP 

## 2012-08-13 ENCOUNTER — Telehealth: Payer: Self-pay

## 2012-08-13 NOTE — Telephone Encounter (Signed)
PA initiated and approved for Adderall XR 20 mg 2 po qd. Case ID# [id number] and is good through 08/25/2013. Letter sent to be scanned      KP

## 2012-08-16 ENCOUNTER — Ambulatory Visit: Payer: BC Managed Care – PPO

## 2012-08-30 ENCOUNTER — Telehealth: Payer: Self-pay | Admitting: Family Medicine

## 2012-08-30 DIAGNOSIS — F988 Other specified behavioral and emotional disorders with onset usually occurring in childhood and adolescence: Secondary | ICD-10-CM

## 2012-08-30 MED ORDER — OSELTAMIVIR PHOSPHATE 75 MG PO CAPS: 75.0000 mg | ORAL_CAPSULE | Freq: Every day | ORAL | Status: DC

## 2012-08-30 MED ORDER — AMPHETAMINE-DEXTROAMPHET ER 20 MG PO CP24: ORAL_CAPSULE | ORAL | Status: DC

## 2012-08-30 NOTE — Telephone Encounter (Signed)
Please advise      KP 

## 2012-08-30 NOTE — Telephone Encounter (Signed)
Spoke with patient and she is aware Rx is being faxed and she will schedule her apt tomorrow when she comes to pick up meds.     KP

## 2012-08-30 NOTE — Telephone Encounter (Signed)
Pt calling and states that daughter was dx with the flu and wanted to know if she should be given anything; pt denies any sx at this time;  She is going out of town to [place] next week; explained to pt that prophylactic treatment is not routinely done in healthy individuals per practice profile.  #2. Pt states that she needs a refill on her Adderall. Explained to pt that message will be sent to MD and will receive a call back regarding Rx; voices understanding (ADHD refills handled by office staff.)

## 2012-08-30 NOTE — Telephone Encounter (Signed)
Refill adderall x 1 but is overdue for ov Ok to take tamiflu 75 mg daily x 10 days for preventative

## 2012-08-30 NOTE — Telephone Encounter (Signed)
Opened in error. BC °

## 2012-09-07 ENCOUNTER — Other Ambulatory Visit: Payer: Self-pay | Admitting: Family Medicine

## 2012-09-20 ENCOUNTER — Encounter: Payer: Self-pay | Admitting: Family Medicine

## 2012-10-08 ENCOUNTER — Ambulatory Visit: Payer: BC Managed Care – PPO | Admitting: Family Medicine

## 2012-10-19 ENCOUNTER — Encounter: Payer: Self-pay | Admitting: Family Medicine

## 2012-10-19 ENCOUNTER — Ambulatory Visit (INDEPENDENT_AMBULATORY_CARE_PROVIDER_SITE_OTHER): Payer: BC Managed Care – PPO | Admitting: Family Medicine

## 2012-10-19 VITALS — BP 100/76 | HR 97 | Temp 99.4°F | Wt 171.0 lb

## 2012-10-19 DIAGNOSIS — F32A Depression, unspecified: Secondary | ICD-10-CM

## 2012-10-19 DIAGNOSIS — F3289 Other specified depressive episodes: Secondary | ICD-10-CM

## 2012-10-19 DIAGNOSIS — F988 Other specified behavioral and emotional disorders with onset usually occurring in childhood and adolescence: Secondary | ICD-10-CM

## 2012-10-19 DIAGNOSIS — M7712 Lateral epicondylitis, left elbow: Secondary | ICD-10-CM

## 2012-10-19 DIAGNOSIS — F329 Major depressive disorder, single episode, unspecified: Secondary | ICD-10-CM

## 2012-10-19 DIAGNOSIS — M7710 Lateral epicondylitis, unspecified elbow: Secondary | ICD-10-CM

## 2012-10-19 MED ORDER — AMPHETAMINE-DEXTROAMPHET ER 20 MG PO CP24: 20.0000 mg | ORAL_CAPSULE | ORAL | Status: DC

## 2012-10-19 MED ORDER — SERTRALINE HCL 100 MG PO TABS: 100.0000 mg | ORAL_TABLET | Freq: Every day | ORAL | Status: DC

## 2012-10-19 MED ORDER — MELOXICAM 15 MG PO TABS: ORAL_TABLET | ORAL | Status: DC

## 2012-10-19 MED ORDER — AMPHETAMINE-DEXTROAMPHET ER 20 MG PO CP24: ORAL_CAPSULE | ORAL | Status: DC

## 2012-10-19 NOTE — Patient Instructions (Addendum)
Tennis Elbow  Your caregiver has diagnosed you with a condition often referred to as "tennis elbow." This results from small tears or soreness (inflammation) at the start (origin) of the extensor muscles of the forearm. Although the condition is often called tennis or golfer's elbow, it is caused by any repetitive action performed by your elbow.  HOME CARE INSTRUCTIONS   If the condition has been short lived, rest may be the only treatment required. Using your opposite hand or arm to perform the task may help. Even changing your grip may help rest the extremity. These may even prevent the condition from recurring.   Longer standing problems, however, will often be relieved faster by:   Using anti-inflammatory agents.   Applying ice packs for 30 minutes at the end of the working day, at bed time, or when activities are finished.   Your caregiver may also have you wear a splint or sling. This will allow the inflamed tendon to heal.  At times, steroid injections aided with a local anesthetic will be required along with splinting for 1 to 2 weeks. Two to three steroid injections will often solve the problem. In some long standing cases, the inflamed tendon does not respond to conservative (non-surgical) therapy. Then surgery may be required to repair it.  MAKE SURE YOU:    Understand these instructions.   Will watch your condition.   Will get help right away if you are not doing well or get worse.  Document Released: 07/21/2005 Document Revised: 10/13/2011 Document Reviewed: 03/08/2008  Exit[employer] Patient Information 2013 [employer].

## 2012-10-19 NOTE — Assessment & Plan Note (Signed)
Refill meds Stable rto 6 months 

## 2012-10-19 NOTE — Assessment & Plan Note (Signed)
Refill zoloft stable 

## 2012-10-19 NOTE — Assessment & Plan Note (Signed)
Tennis elbow strap mobic prn Ortho if no better

## 2012-10-19 NOTE — Progress Notes (Signed)
  Subjective:    Patient ID: Jillian Green, female    DOB: 1971-06-08, 42 y.o.   MRN: [id number]  HPI Pt here for f/u add and c/o pain in L elbow.  Pt lifts a lot of boxes regularly    Review of Systems As above    Objective:   Physical Exam  BP 100/76  Pulse 97  Temp(Src) 99.4 F (37.4 C) (Oral)  Wt 171 lb (77.565 kg)  BMI 28.46 kg/m2  SpO2 98% General appearance: alert, cooperative, appears stated age and no distress Lungs: clear to auscultation bilaterally Heart: S1, S2 normal Extremities: extremities normal, atraumatic, no cyanosis or edema and pain L elbow with palpation c/w tennis elbow       Assessment & Plan:

## 2012-11-15 ENCOUNTER — Other Ambulatory Visit: Payer: Self-pay | Admitting: Family Medicine

## 2012-11-16 NOTE — Telephone Encounter (Signed)
Last seen and filled 10/19/12 #30. Please advise     KP

## 2012-12-19 ENCOUNTER — Other Ambulatory Visit: Payer: Self-pay | Admitting: Family Medicine

## 2012-12-20 ENCOUNTER — Other Ambulatory Visit: Payer: Self-pay | Admitting: General Practice

## 2012-12-20 MED ORDER — MELOXICAM 15 MG PO TABS: ORAL_TABLET | ORAL | Status: DC

## 2012-12-20 NOTE — Telephone Encounter (Signed)
Med filled.  

## 2012-12-20 NOTE — Telephone Encounter (Signed)
Last OV 10-19-12, last filled 11-15-12 #30

## 2012-12-29 ENCOUNTER — Other Ambulatory Visit: Payer: Self-pay | Admitting: General Practice

## 2012-12-29 NOTE — Telephone Encounter (Signed)
Pt just received a refill on 12-20-12 #30 with 0 refills. Is not due for another refill until 01-20-13.

## 2013-02-08 ENCOUNTER — Telehealth: Payer: Self-pay | Admitting: *Deleted

## 2013-02-08 DIAGNOSIS — F988 Other specified behavioral and emotional disorders with onset usually occurring in childhood and adolescence: Secondary | ICD-10-CM

## 2013-02-08 MED ORDER — AMPHETAMINE-DEXTROAMPHET ER 20 MG PO CP24: ORAL_CAPSULE | ORAL | Status: DC

## 2013-02-08 NOTE — Telephone Encounter (Signed)
Ok to refill for 3 months if pt wishes

## 2013-02-08 NOTE — Telephone Encounter (Signed)
msg left advising Rx will be available for pick up tomorrow.     KP

## 2013-02-08 NOTE — Telephone Encounter (Signed)
Pt is requesting a refill for adderall 20mg  Last OV 3.18.14 Last filled 5.18.14

## 2013-05-23 ENCOUNTER — Other Ambulatory Visit (HOSPITAL_BASED_OUTPATIENT_CLINIC_OR_DEPARTMENT_OTHER): Payer: Self-pay | Admitting: Obstetrics and Gynecology

## 2013-05-23 DIAGNOSIS — Z1231 Encounter for screening mammogram for malignant neoplasm of breast: Secondary | ICD-10-CM

## 2013-05-25 ENCOUNTER — Ambulatory Visit (HOSPITAL_BASED_OUTPATIENT_CLINIC_OR_DEPARTMENT_OTHER): Payer: BC Managed Care – PPO

## 2013-06-01 ENCOUNTER — Ambulatory Visit (HOSPITAL_BASED_OUTPATIENT_CLINIC_OR_DEPARTMENT_OTHER)
Admission: RE | Admit: 2013-06-01 | Discharge: 2013-06-01 | Disposition: A | Discharge status: Home / Self Care | Payer: BC Managed Care – PPO | Source: Ambulatory Visit | Attending: Obstetrics and Gynecology | Admitting: Obstetrics and Gynecology

## 2013-06-01 DIAGNOSIS — Z1231 Encounter for screening mammogram for malignant neoplasm of breast: Secondary | ICD-10-CM | POA: Insufficient documentation

## 2013-06-06 ENCOUNTER — Telehealth: Payer: Self-pay | Admitting: *Deleted

## 2013-06-06 NOTE — Telephone Encounter (Signed)
Patient is requesting refill on Adderall.  Last OV 10/09/12 (overdue for OV) Last filled 02/08/13 #60 with additional scripts for 3 month supply given Last UDS was 07/2012, overdue for this also Okay to refill?

## 2013-06-06 NOTE — Telephone Encounter (Signed)
Detailed MSG left on Vm advising the patient to schedule an apt for refills.       KP

## 2013-06-06 NOTE — Telephone Encounter (Signed)
refilll after pt makes appointment

## 2013-06-15 ENCOUNTER — Other Ambulatory Visit: Payer: Self-pay | Admitting: Family Medicine

## 2013-06-16 NOTE — Telephone Encounter (Signed)
Sertraline refilled.  

## 2013-06-17 ENCOUNTER — Encounter: Payer: Self-pay | Admitting: Family Medicine

## 2013-06-17 ENCOUNTER — Ambulatory Visit (INDEPENDENT_AMBULATORY_CARE_PROVIDER_SITE_OTHER): Payer: BC Managed Care – PPO | Admitting: Family Medicine

## 2013-06-17 VITALS — BP 116/80 | HR 91 | Temp 97.1°F | Wt 177.8 lb

## 2013-06-17 DIAGNOSIS — J329 Chronic sinusitis, unspecified: Secondary | ICD-10-CM

## 2013-06-17 DIAGNOSIS — F988 Other specified behavioral and emotional disorders with onset usually occurring in childhood and adolescence: Secondary | ICD-10-CM

## 2013-06-17 MED ORDER — FLUTICASONE PROPIONATE 50 MCG/ACT NA SUSP: NASAL | Status: DC

## 2013-06-17 MED ORDER — MELOXICAM 15 MG PO TABS: ORAL_TABLET | ORAL | Status: DC

## 2013-06-17 MED ORDER — CEFUROXIME AXETIL 500 MG PO TABS: 500.0000 mg | ORAL_TABLET | Freq: Two times a day (BID) | ORAL | Status: AC

## 2013-06-17 MED ORDER — LISDEXAMFETAMINE DIMESYLATE 40 MG PO CAPS: 40.0000 mg | ORAL_CAPSULE | ORAL | Status: DC

## 2013-06-17 NOTE — Assessment & Plan Note (Signed)
D/c adderall xr Change to Vyvanse 40 mg qd  rto 1 month

## 2013-06-17 NOTE — Progress Notes (Signed)
  Subjective:     Jillian Green is a 42 y.o. female who presents for evaluation of sinus pain. Symptoms include: congestion, facial pain, headaches, nasal congestion, post nasal drip and sinus pressure. Onset of symptoms was 5 days ago. Symptoms have been gradually worsening since that time. Past history is significant for no history of pneumonia or bronchitis. Patient is a non-smoker. Pt is also here to f/u ADD.  She does not feel like the medications are working.  The following portions of the patient's history were reviewed and updated as appropriate: allergies, current medications, past family history, past medical history, past social history, past surgical history and problem list.  Review of Systems Pertinent items are noted in HPI.   Objective:    BP 116/80  Pulse 91  Temp(Src) 97.1 F (36.2 C) (Oral)  Wt 177 lb 12.8 oz (80.65 kg)  SpO2 97% General appearance: alert, cooperative, appears stated age and no distress Ears: normal TM's and external ear canals both ears Nose: green discharge, moderate congestion, turbinates red, swollen, sinus tenderness bilateral Throat: lips, mucosa, and tongue normal; teeth and gums normal Neck: moderate anterior cervical adenopathy, supple, symmetrical, trachea midline and thyroid not enlarged, symmetric, no tenderness/mass/nodules Lungs: clear to auscultation bilaterally Heart: S1, S2 normal    Assessment:    Acute bacterial sinusitis.    Plan:    Nasal steroids per medication orders. Antihistamines per medication orders. Ceftin per medication orders. f/u 1 month or sooner prn

## 2013-06-17 NOTE — Progress Notes (Signed)
Pre visit review using our clinic review tool, if applicable. No additional management support is needed unless otherwise documented below in the visit note. 

## 2013-06-17 NOTE — Patient Instructions (Addendum)
rto 1 month ADD    Sinusitis Sinusitis is redness, soreness, and swelling (inflammation) of the paranasal sinuses. Paranasal sinuses are air pockets within the bones of your face (beneath the eyes, the middle of the forehead, or above the eyes). In healthy paranasal sinuses, mucus is able to drain out, and air is able to circulate through them by way of your nose. However, when your paranasal sinuses are inflamed, mucus and air can become trapped. This can allow bacteria and other germs to grow and cause infection. Sinusitis can develop quickly and last only a short time (acute) or continue over a long period (chronic). Sinusitis that lasts for more than 12 weeks is considered chronic.  CAUSES  Causes of sinusitis include:  Allergies.  Structural abnormalities, such as displacement of the cartilage that separates your nostrils (deviated septum), which can decrease the air flow through your nose and sinuses and affect sinus drainage.  Functional abnormalities, such as when the small hairs (cilia) that line your sinuses and help remove mucus do not work properly or are not present. SYMPTOMS  Symptoms of acute and chronic sinusitis are the same. The primary symptoms are pain and pressure around the affected sinuses. Other symptoms include:  Upper toothache.  Earache.  Headache.  Bad breath.  Decreased sense of smell and taste.  A cough, which worsens when you are lying flat.  Fatigue.  Fever.  Thick drainage from your nose, which often is green and may contain pus (purulent).  Swelling and warmth over the affected sinuses. DIAGNOSIS  Your caregiver will perform a physical exam. During the exam, your caregiver may:  Look in your nose for signs of abnormal growths in your nostrils (nasal polyps).  Tap over the affected sinus to check for signs of infection.  View the inside of your sinuses (endoscopy) with a special imaging device with a light attached (endoscope), which is  inserted into your sinuses. If your caregiver suspects that you have chronic sinusitis, one or more of the following tests may be recommended:  Allergy tests.  Nasal culture A sample of mucus is taken from your nose and sent to a lab and screened for bacteria.  Nasal cytology A sample of mucus is taken from your nose and examined by your caregiver to determine if your sinusitis is related to an allergy. TREATMENT  Most cases of acute sinusitis are related to a viral infection and will resolve on their own within 10 days. Sometimes medicines are prescribed to help relieve symptoms (pain medicine, decongestants, nasal steroid sprays, or saline sprays).  However, for sinusitis related to a bacterial infection, your caregiver will prescribe antibiotic medicines. These are medicines that will help kill the bacteria causing the infection.  Rarely, sinusitis is caused by a fungal infection. In theses cases, your caregiver will prescribe antifungal medicine. For some cases of chronic sinusitis, surgery is needed. Generally, these are cases in which sinusitis recurs more than 3 times per year, despite other treatments. HOME CARE INSTRUCTIONS   Drink plenty of water. Water helps thin the mucus so your sinuses can drain more easily.  Use a humidifier.  Inhale steam 3 to 4 times a day (for example, sit in the bathroom with the shower running).  Apply a warm, moist washcloth to your face 3 to 4 times a day, or as directed by your caregiver.  Use saline nasal sprays to help moisten and clean your sinuses.  Take over-the-counter or prescription medicines for pain, discomfort, or fever only as  directed by your caregiver. SEEK IMMEDIATE MEDICAL CARE IF:  You have increasing pain or severe headaches.  You have nausea, vomiting, or drowsiness.  You have swelling around your face.  You have vision problems.  You have a stiff neck.  You have difficulty breathing. MAKE SURE YOU:   Understand these  instructions.  Will watch your condition.  Will get help right away if you are not doing well or get worse. Document Released: 07/21/2005 Document Revised: 10/13/2011 Document Reviewed: 08/05/2011 [facility]Exit[facility] Patient Information 2014 [employer]Care, Maryland[employer].

## 2013-06-18 ENCOUNTER — Encounter: Payer: Self-pay | Admitting: Family Medicine

## 2013-06-23 ENCOUNTER — Telehealth: Payer: Self-pay | Admitting: *Deleted

## 2013-06-23 NOTE — Telephone Encounter (Signed)
Prior Authorization approved for Vyvanse. Case Id# [id number] Effective dates 06/02/2013-06/23/2014.

## 2013-07-17 ENCOUNTER — Other Ambulatory Visit: Payer: Self-pay | Admitting: Family Medicine

## 2013-07-18 NOTE — Telephone Encounter (Signed)
Last seen and filled 06/17/13 #30. Please advise      KP

## 2013-07-21 ENCOUNTER — Telehealth: Payer: Self-pay | Admitting: *Deleted

## 2013-07-21 MED ORDER — LISDEXAMFETAMINE DIMESYLATE 50 MG PO CAPS: 50.0000 mg | ORAL_CAPSULE | Freq: Every day | ORAL | Status: DC

## 2013-07-21 NOTE — Telephone Encounter (Signed)
We can increase the dose to 50 mg 1 po qd #30  --- we can increase next month if needed

## 2013-07-21 NOTE — Telephone Encounter (Signed)
Patient called back and stated that the vyvanse  working as good as the Adderall. Would like to know want is the next step in treatment? Please advised. SW

## 2013-07-21 NOTE — Telephone Encounter (Signed)
Patient states that she is willing to try. Med filled and patient states she will be tomorrow afternoon to pick up prescription

## 2013-07-21 NOTE — Telephone Encounter (Signed)
Patient called and stated that she used to be on Adderall but got switch to Vyvanse. Patient states that she didn't see a difference in taking the Vyvanse. Patient states that it too doesn't last long,. Called patient to see if she wanted to continue taking the vyvanse or talk about other options. SW

## 2013-07-31 ENCOUNTER — Other Ambulatory Visit: Payer: Self-pay | Admitting: Internal Medicine

## 2013-08-01 NOTE — Telephone Encounter (Signed)
Sertraline refilled per protocol. JG//CMA 

## 2013-08-08 ENCOUNTER — Telehealth: Payer: Self-pay | Admitting: Family Medicine

## 2013-08-08 NOTE — Telephone Encounter (Signed)
Patient states that since she has switched to Vyvanse her mood has been off and it is not working for her. Please advise.

## 2013-08-08 NOTE — Telephone Encounter (Signed)
Pt is returning call from Jillian Green.

## 2013-08-08 NOTE — Telephone Encounter (Signed)
MSG left to call the office      KP 

## 2013-08-09 MED ORDER — AMPHETAMINE-DEXTROAMPHET ER 30 MG PO CP24: 60.0000 mg | ORAL_CAPSULE | ORAL | Status: DC

## 2013-08-09 NOTE — Telephone Encounter (Signed)
We can try adderall xr 30 mg  2 po qam,  #60  ---but warn her a lot of ins co will not pay for more than 30 pills

## 2013-08-09 NOTE — Telephone Encounter (Signed)
Patient aware and voiced understanding. Rx ready for pick up.    KP

## 2013-08-09 NOTE — Telephone Encounter (Signed)
Spoke with patient and she stated she is having side effects the Vyvanse, she stated she has been mean, and had been fatigue. Ritalin had not worked in the past, and she was recently on Adderall. She would like to go back to the Adderall at a higher dose. She was taking the 20 mg Xr 2 po qd but it was not enough. Please advise    KP

## 2013-08-26 ENCOUNTER — Other Ambulatory Visit: Payer: Self-pay | Admitting: Family Medicine

## 2013-08-29 NOTE — Telephone Encounter (Signed)
Last seen 06/17/13 and filled 07/17/13 #30. please advise    KP

## 2013-09-14 ENCOUNTER — Telehealth: Payer: Self-pay | Admitting: *Deleted

## 2013-09-14 DIAGNOSIS — F988 Other specified behavioral and emotional disorders with onset usually occurring in childhood and adolescence: Secondary | ICD-10-CM

## 2013-09-14 NOTE — Telephone Encounter (Signed)
amphetamine-dextroamphetamine (ADDERALL XR) 30 MG 24 hr capsule Last refill: 08/09/13 #60, 0 refills Last OV: 06/17/14 Needs UDS

## 2013-09-15 MED ORDER — AMPHETAMINE-DEXTROAMPHET ER 30 MG PO CP24: 60.0000 mg | ORAL_CAPSULE | ORAL | Status: DC

## 2013-09-15 NOTE — Telephone Encounter (Signed)
LMOVM that Rx was ready to be picked up

## 2013-09-15 NOTE — Telephone Encounter (Signed)
Ok to refill as long as seen in last 6 months

## 2013-09-15 NOTE — Telephone Encounter (Signed)
Rx for adderall printed and placed on ledge for signature.

## 2013-09-23 ENCOUNTER — Encounter: Payer: Self-pay | Admitting: Family Medicine

## 2013-09-23 ENCOUNTER — Ambulatory Visit (INDEPENDENT_AMBULATORY_CARE_PROVIDER_SITE_OTHER): Payer: BC Managed Care – PPO | Admitting: Family Medicine

## 2013-09-23 VITALS — BP 120/80 | HR 102 | Temp 98.4°F | Wt 178.0 lb

## 2013-09-23 DIAGNOSIS — R51 Headache: Secondary | ICD-10-CM | POA: Insufficient documentation

## 2013-09-23 DIAGNOSIS — R42 Dizziness and giddiness: Secondary | ICD-10-CM

## 2013-09-23 DIAGNOSIS — G5600 Carpal tunnel syndrome, unspecified upper limb: Secondary | ICD-10-CM | POA: Insufficient documentation

## 2013-09-23 DIAGNOSIS — R519 Headache, unspecified: Secondary | ICD-10-CM | POA: Insufficient documentation

## 2013-09-23 LAB — BASIC METABOLIC PANEL
BUN: 8 mg/dL (ref 6–23)
CHLORIDE: 104 meq/L (ref 96–112)
CO2: 26 mEq/L (ref 19–32)
CREATININE: 0.7 mg/dL (ref 0.4–1.2)
Calcium: 8.9 mg/dL (ref 8.4–10.5)
GFR: 101.21 mL/min (ref >60.00)
GLUCOSE: 161 mg/dL — AB (ref 70–99)
POTASSIUM: 3.4 meq/L — AB (ref 3.5–5.1)
Sodium: 137 mEq/L (ref 135–145)

## 2013-09-23 LAB — POCT URINALYSIS DIPSTICK
BILIRUBIN UA: NEGATIVE
Blood, UA: NEGATIVE
GLUCOSE UA: NEGATIVE
KETONES UA: NEGATIVE
Leukocytes, UA: NEGATIVE
Nitrite, UA: NEGATIVE
Protein, UA: NEGATIVE
SPEC GRAV UA: 1.015
Urobilinogen, UA: 0.2
pH, UA: 6

## 2013-09-23 LAB — CBC WITH DIFFERENTIAL/PLATELET
BASOS ABS: 0 10*3/uL (ref 0.0–0.1)
Basophils Relative: 0.3 % (ref 0.0–3.0)
EOS ABS: 0.2 10*3/uL (ref 0.0–0.7)
Eosinophils Relative: 3.3 % (ref 0.0–5.0)
HCT: 41 % (ref 36.0–46.0)
Hemoglobin: 13.7 g/dL (ref 12.0–15.0)
LYMPHS ABS: 1.7 10*3/uL (ref 0.7–4.0)
Lymphocytes Relative: 30 % (ref 12.0–46.0)
MCHC: 33.5 g/dL (ref 30.0–36.0)
MCV: 95.2 fl (ref 78.0–100.0)
MONO ABS: 0.3 10*3/uL (ref 0.1–1.0)
Monocytes Relative: 4.6 % (ref 3.0–12.0)
Neutro Abs: 3.5 10*3/uL (ref 1.4–7.7)
Neutrophils Relative %: 61.8 % (ref 43.0–77.0)
PLATELETS: 267 10*3/uL (ref 150.0–400.0)
RBC: 4.31 Mil/uL (ref 3.87–5.11)
RDW: 13 % (ref 11.5–14.6)
WBC: 5.6 10*3/uL (ref 4.5–10.5)

## 2013-09-23 LAB — HEPATIC FUNCTION PANEL
ALT: 19 U/L (ref 0–35)
AST: 16 U/L (ref 0–37)
Albumin: 3.9 g/dL (ref 3.5–5.2)
Alkaline Phosphatase: 85 U/L (ref 39–117)
BILIRUBIN DIRECT: 0 mg/dL (ref 0.0–0.3)
BILIRUBIN TOTAL: 0.5 mg/dL (ref 0.3–1.2)
Total Protein: 7.2 g/dL (ref 6.0–8.3)

## 2013-09-23 LAB — FERRITIN: Ferritin: 103.5 ng/mL (ref 10.0–291.0)

## 2013-09-23 LAB — IBC PANEL
Iron: 156 ug/dL — ABNORMAL HIGH (ref 42–145)
Saturation Ratios: 38 % (ref 20.0–50.0)
Transferrin: 293.5 mg/dL (ref 212.0–360.0)

## 2013-09-23 LAB — TSH: TSH: 0.99 u[IU]/mL (ref 0.35–5.50)

## 2013-09-23 LAB — VITAMIN B12: Vitamin B-12: 285 pg/mL (ref 211–911)

## 2013-09-23 LAB — SEDIMENTATION RATE: SED RATE: 17 mm/h (ref 0–22)

## 2013-09-23 MED ORDER — NALTREXONE-BUPROPION HCL ER 8-90 MG PO TB12: ORAL_TABLET | ORAL | Status: DC

## 2013-09-23 NOTE — Progress Notes (Signed)
Patient ID: Jillian Green, female   DOB: September 22, 1970, 43 y.o.   MRN: [id number]   Subjective:    Patient ID: Jillian Green, female    DOB: Jul 09, 1971, 43 y.o.   MRN: [id number] HPI Pt here c/o numbness and tingling in both hands x months.  She does sleep on both hands and states its 1st - 4th digits that are affected.  Symptoms only in hands --not in arms.  Pt also c/o allergy symptoms with pressure in frontal sinuses and ears popping.  She was having some dizziness but it resolved with abx.  Pt is taking zyrtec daily.  Pt is also c/o weight gain and is struggling to lose weight.  She is exercising 3-4 days a weeks and watches diet.            Objective:     BP 120/80  Pulse 102  Temp(Src) 98.4 F (36.9 C) (Oral)  Wt 178 lb (80.74 kg)  SpO2 97% General appearance: alert, cooperative, appears stated age and no distress Ears: R tm--dull ,, + fluid Nose: Nares normal. Septum midline. Mucosa normal. No drainage or sinus tenderness. Throat: lips, mucosa, and tongue normal; teeth and gums normal Neck: no adenopathy, no carotid bruit, no JVD, supple, symmetrical, trachea midline and thyroid not enlarged, symmetric, no tenderness/mass/nodules Lungs: clear to auscultation bilaterally Heart: regular rate and rhythm, S1, S2 normal, no murmur, click, rub or gallop Extremities: extremities normal, atraumatic, no cyanosis or edema---numbness and tingling in hands,        Assessment & Plan:  1. CTS (carpal tunnel syndrome) Wrist splints  If no better refer to hand surgeon  2. Dizzy Check labs--- better since finished abx - Basic metabolic panel - CBC with Differential - Hepatic function panel - POCT urinalysis dipstick - TSH - Vitamin B12 - IBC panel - Ferritin  3. Headache(784.0) Improved since off abx ---take zyrtec and add nasacort or flonase-- rto prn - Basic metabolic panel - CBC with Differential - Hepatic function panel - POCT urinalysis dipstick - TSH -  Vitamin B12 - IBC panel - Ferritin - Sed Rate (ESR)  4. Morbid obesity contrave---1 po qam x1 week, 1 po bid for 1 week, then 2 po qam and 1 po qpm x 1 week then 2 po bid --rto 1 month - Naltrexone-Bupropion HCl ER 8-90 MG TB12; 2 po bid  Dispense: 120 tablet; Refill: 5

## 2013-09-23 NOTE — Patient Instructions (Signed)

## 2013-09-23 NOTE — Progress Notes (Signed)
Pre visit review using our clinic review tool, if applicable. No additional management support is needed unless otherwise documented below in the visit note. 

## 2013-09-26 ENCOUNTER — Telehealth: Payer: Self-pay | Admitting: *Deleted

## 2013-09-26 NOTE — Telephone Encounter (Signed)
Prior authorization paperwork faxed for Adderall XR 30 mg. Awaiting response. JG//CMA

## 2013-10-05 NOTE — Telephone Encounter (Signed)
Called [employer] to speak to a representative concerning D-amphetamine salt xr 30mg  and Contrave8-9mg  er tablets.  Contrave approved from 09/05/2013-02/02/2014, Case ID#: [id number].  D-amphetamine salt xr 30mg  was approved from 09/14/2013-10/05/2014, Case ID#: [id number]. JG//CMA

## 2013-10-07 ENCOUNTER — Telehealth: Payer: Self-pay | Admitting: *Deleted

## 2013-10-07 NOTE — Telephone Encounter (Signed)
error 

## 2013-10-11 ENCOUNTER — Telehealth: Payer: Self-pay | Admitting: *Deleted

## 2013-10-11 NOTE — Telephone Encounter (Signed)
UDS collected on 09/23/13 and reviewed by Dr. Laury AxonLowne. Results deemed to be low risk.

## 2013-11-06 ENCOUNTER — Other Ambulatory Visit: Payer: Self-pay | Admitting: Family Medicine

## 2013-11-06 DIAGNOSIS — F988 Other specified behavioral and emotional disorders with onset usually occurring in childhood and adolescence: Secondary | ICD-10-CM

## 2013-11-07 MED ORDER — AMPHETAMINE-DEXTROAMPHET ER 30 MG PO CP24: 60.0000 mg | ORAL_CAPSULE | ORAL | Status: DC

## 2013-11-07 NOTE — Telephone Encounter (Signed)
Patient called and requested a refill for adderall and sertraline (ZOLOFT) 100 MG tablet.

## 2013-11-07 NOTE — Telephone Encounter (Signed)
Rx printed and VM left advising Rx ready for pick up.    KP

## 2013-11-09 ENCOUNTER — Encounter: Payer: Self-pay | Admitting: Family Medicine

## 2013-12-02 ENCOUNTER — Ambulatory Visit (INDEPENDENT_AMBULATORY_CARE_PROVIDER_SITE_OTHER): Payer: BC Managed Care – PPO | Admitting: Family Medicine

## 2013-12-02 ENCOUNTER — Encounter: Payer: Self-pay | Admitting: Family Medicine

## 2013-12-02 VITALS — BP 146/88 | HR 98 | Temp 98.0°F | Wt 172.0 lb

## 2013-12-02 DIAGNOSIS — F411 Generalized anxiety disorder: Secondary | ICD-10-CM

## 2013-12-02 DIAGNOSIS — E538 Deficiency of other specified B group vitamins: Secondary | ICD-10-CM

## 2013-12-02 DIAGNOSIS — F988 Other specified behavioral and emotional disorders with onset usually occurring in childhood and adolescence: Secondary | ICD-10-CM

## 2013-12-02 DIAGNOSIS — G5600 Carpal tunnel syndrome, unspecified upper limb: Secondary | ICD-10-CM

## 2013-12-02 DIAGNOSIS — R51 Headache: Secondary | ICD-10-CM

## 2013-12-02 MED ORDER — AMPHETAMINE-DEXTROAMPHET ER 30 MG PO CP24: ORAL_CAPSULE | ORAL | Status: DC

## 2013-12-02 MED ORDER — SERTRALINE HCL 50 MG PO TABS: 50.0000 mg | ORAL_TABLET | Freq: Every day | ORAL | Status: DC

## 2013-12-02 MED ORDER — CYANOCOBALAMIN 1000 MCG/ML IJ SOLN
1000.0000 ug | Freq: Once | INTRAMUSCULAR | Status: AC
  Administered 2013-12-02: 1000 ug via INTRAMUSCULAR

## 2013-12-02 MED ORDER — MELOXICAM 15 MG PO TABS: ORAL_TABLET | ORAL | Status: DC

## 2013-12-02 MED ORDER — CYANOCOBALAMIN 1000 MCG/ML IJ KIT: PACK | INTRAMUSCULAR | Status: DC

## 2013-12-02 NOTE — Progress Notes (Signed)
Pre visit review using our clinic review tool, if applicable. No additional management support is needed unless otherwise documented below in the visit note. 
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
## 2013-12-02 NOTE — Patient Instructions (Signed)

## 2014-01-02 ENCOUNTER — Encounter: Payer: Self-pay | Admitting: Family Medicine

## 2014-01-02 ENCOUNTER — Ambulatory Visit (INDEPENDENT_AMBULATORY_CARE_PROVIDER_SITE_OTHER): Payer: BC Managed Care – PPO | Admitting: Family Medicine

## 2014-01-02 VITALS — BP 120/70 | HR 93 | Temp 98.4°F | Wt 171.0 lb

## 2014-01-02 DIAGNOSIS — J329 Chronic sinusitis, unspecified: Secondary | ICD-10-CM

## 2014-01-02 DIAGNOSIS — N760 Acute vaginitis: Secondary | ICD-10-CM

## 2014-01-02 DIAGNOSIS — G5600 Carpal tunnel syndrome, unspecified upper limb: Secondary | ICD-10-CM

## 2014-01-02 MED ORDER — PREDNISONE 10 MG PO TABS: ORAL_TABLET | ORAL | Status: DC

## 2014-01-02 MED ORDER — CLARITHROMYCIN ER 500 MG PO TB24: 1000.0000 mg | ORAL_TABLET | Freq: Every day | ORAL | Status: AC

## 2014-01-02 MED ORDER — FLUCONAZOLE 150 MG PO TABS: ORAL_TABLET | ORAL | Status: DC

## 2014-01-02 NOTE — Patient Instructions (Signed)

## 2014-01-02 NOTE — Progress Notes (Signed)
Pre visit review using our clinic review tool, if applicable. No additional management support is needed unless otherwise documented below in the visit note. 

## 2014-01-02 NOTE — Progress Notes (Signed)
  Subjective:     Jillian Green is a 43 y.o. female who presents for evaluation of sinus pain. Symptoms include: congestion, cough, facial pain, headaches, nasal congestion, sinus pressure and tooth pain. Onset of symptoms was several weeks ago. Symptoms have been gradually worsening since that time. Past history is significant for no history of pneumonia or bronchitis. Patient is a non-smoker.  The following portions of the patient's history were reviewed and updated as appropriate: allergies, current medications, past family history, past medical history, past social history, past surgical history and problem list.  Review of Systems Pertinent items are noted in HPI.   Objective:    BP 120/70  Pulse 93  Temp(Src) 98.4 F (36.9 C) (Oral)  Wt 171 lb (77.565 kg)  SpO2 98% General appearance: cooperative Eyes: conjunctivae/corneas clear. PERRL, EOM's intact. Fundi benign. Ears: normal TM's and external ear canals both ears Nose: Nares normal. Septum midline. Mucosa normal. No drainage or sinus tenderness., green discharge, moderate congestion, turbinates red, swollen, sinus tenderness bilateral Throat: abnormal findings: mild oropharyngeal erythema Neck: moderate anterior cervical adenopathy, supple, symmetrical, trachea midline and thyroid not enlarged, symmetric, no tenderness/mass/nodules Lungs: clear to auscultation bilaterally Heart: S1, S2 normal    Assessment:    Acute bacterial sinusitis.    Plan:    Neti pot recommended. Instructions given. Nasal steroids per medication orders. Antihistamines per medication orders. Biaxin per medication orders. Follow up in a few days or as needed.   1. Unspecified sinusitis (chronic)   - clarithromycin (BIAXIN XL) 500 MG 24 hr tablet; Take 2 tablets (1,000 mg total) by mouth daily.  Dispense: 28 tablet; Refill: 0 - predniSONE (DELTASONE) 10 MG tablet; 3 po qd for 3 days then 2 po qd for 3 days the 1 po qd for 3 days  Dispense:  18 tablet; Refill: 0  2. Vaginitis and vulvovaginitis   - fluconazole (DIFLUCAN) 150 MG tablet; 1 po qd x1, may repeat in 3 days prn  Dispense: 2 tablet; Refill: 5  3. CTS (carpal tunnel syndrome) Pt requesting a referral  - Ambulatory referral to Orthopedic Surgery

## 2014-02-16 ENCOUNTER — Telehealth: Payer: Self-pay | Admitting: Family Medicine

## 2014-02-16 MED ORDER — NALTREXONE-BUPROPION HCL ER 8-90 MG PO TB12: ORAL_TABLET | ORAL | Status: DC

## 2014-02-16 NOTE — Telephone Encounter (Signed)
Refill with 5 refills 

## 2014-02-16 NOTE — Telephone Encounter (Signed)
Rx faxed.    KP 

## 2014-02-16 NOTE — Telephone Encounter (Signed)
Caller name: Jailin Relation to pt: Call back [phone] [facility] [address].   Reason for call: pt is out of Rx Naltrexone-Bupropion HCl ER 8-90 MG TB12  (CONTRAVE) IS NEEDING THIS REFILLED AS SOON AS POSSIBLE.

## 2014-02-16 NOTE — Telephone Encounter (Signed)
Pt stated that it was needing another PA for the refills.

## 2014-02-16 NOTE — Telephone Encounter (Signed)
Last seen 01/02/14 and filled 09/23/13 #120 with 5 refills. Please advise    KP

## 2014-02-17 ENCOUNTER — Telehealth: Payer: Self-pay | Admitting: Family Medicine

## 2014-02-17 NOTE — Telephone Encounter (Signed)
[facility] is faxing over a form for clinical necessity for medication

## 2014-02-17 NOTE — Telephone Encounter (Signed)
See previous phone note:  Pt is needing the RX faxed again; they are saying they don't have the RX

## 2014-02-17 NOTE — Telephone Encounter (Signed)
Received Prior Authorization Request form via fax from [facility].  Billing sheet attached and forms filled out as much as possible and placed in folder for Dr. Laury AxonLowne to complete and sign.//AB/CMA

## 2014-02-24 NOTE — Telephone Encounter (Signed)
Forms completed and faxed.  

## 2014-03-08 NOTE — Telephone Encounter (Signed)
Received Prior Authorization Request form via fax from [facility].  Billing sheet attached and forms filled out as much as possible and placed in folder for Dr. Laury AxonLowne to complete.//AB/CMA

## 2014-03-20 NOTE — Telephone Encounter (Signed)
Paperwork completed and faxed 8/17

## 2014-03-27 ENCOUNTER — Telehealth: Payer: Self-pay | Admitting: Family Medicine

## 2014-03-27 DIAGNOSIS — F988 Other specified behavioral and emotional disorders with onset usually occurring in childhood and adolescence: Secondary | ICD-10-CM

## 2014-03-27 MED ORDER — AMPHETAMINE-DEXTROAMPHET ER 30 MG PO CP24: ORAL_CAPSULE | ORAL | Status: DC

## 2014-03-27 NOTE — Telephone Encounter (Signed)
Ok to refill for 3 months  gaye-- please make sure new people put all needed info in phone note--- last ov, last refill etc--- thank you

## 2014-03-27 NOTE — Telephone Encounter (Signed)
Caller name: Kaziyah Relation to pt: Call back number: [phone] Pharmacy:  Reason for call: pt needs refill on Rx amphetamine-dextroamphetamine (ADDERALL XR) 30 MG 24 hr  Call when complete

## 2014-03-27 NOTE — Telephone Encounter (Signed)
Caller name: Johnesha  Relation to pt: self  Call back number:[phone]  Pharmacy: [facility] [phone]   Reason for call: pt requesting refill amphetamine-dextroamphetamine (ADDERALL XR) 30 MG 24 hr capsule

## 2014-03-27 NOTE — Telephone Encounter (Signed)
Patient aware med's ready for pick up tomorrow.      KP

## 2014-03-31 ENCOUNTER — Telehealth: Payer: Self-pay

## 2014-03-31 MED ORDER — ALPRAZOLAM 0.5 MG PO TABS: 0.5000 mg | ORAL_TABLET | Freq: Three times a day (TID) | ORAL | Status: DC | PRN

## 2014-03-31 NOTE — Telephone Encounter (Signed)
Faxed.   KP 

## 2014-03-31 NOTE — Telephone Encounter (Signed)
Xanax 0.5 mg 1 po tid prn #30   

## 2014-03-31 NOTE — Telephone Encounter (Signed)
Jillian Green [phone] Okay to leave message [facility] in [place] today  Patrick called she said the last time she was here [name] had told her if she needed something else for anxiety to call. The last couple of weeks have been difficult. Her mom is in hospital had surgery, she could not be there. Yesterday she was in a meeting and something happened and she had to leave meeting because of crying uncontrollable. She has had several of these episode recently.

## 2014-03-31 NOTE — Telephone Encounter (Signed)
Last seen 01/02/14. Please advise      KP 

## 2014-04-22 ENCOUNTER — Other Ambulatory Visit: Payer: Self-pay | Admitting: Family Medicine

## 2014-05-22 ENCOUNTER — Other Ambulatory Visit: Payer: Self-pay | Admitting: Family Medicine

## 2014-06-22 ENCOUNTER — Other Ambulatory Visit: Payer: Self-pay | Admitting: Family Medicine

## 2014-07-21 ENCOUNTER — Other Ambulatory Visit: Payer: Self-pay | Admitting: Family Medicine

## 2014-07-27 ENCOUNTER — Telehealth: Payer: Self-pay | Admitting: Family Medicine

## 2014-07-27 DIAGNOSIS — F988 Other specified behavioral and emotional disorders with onset usually occurring in childhood and adolescence: Secondary | ICD-10-CM

## 2014-07-27 MED ORDER — AMPHETAMINE-DEXTROAMPHET ER 30 MG PO CP24: 60.0000 mg | ORAL_CAPSULE | ORAL | Status: DC

## 2014-07-27 NOTE — Telephone Encounter (Signed)
Caller name:[place], Libby Relation to ZO:XWRUpt:self Call back number:[phone] Pharmacy:  Reason for call: pt need rx amphetamine-dextroamphetamine (ADDERALL XR) 30 MG 24 hr , please call when available for pick up

## 2014-07-27 NOTE — Telephone Encounter (Signed)
Last OV 01-02-14 (sinus) Adderall last filled 11-07-13 #60 with 0  Low Risk, UDS due

## 2014-07-27 NOTE — Telephone Encounter (Signed)
Today I am covering for her PCP, okay 60

## 2014-07-27 NOTE — Telephone Encounter (Signed)
LMOM informing Pt rx is ready for pick up at front desk.

## 2014-08-17 ENCOUNTER — Other Ambulatory Visit: Payer: Self-pay | Admitting: Family Medicine

## 2014-08-22 ENCOUNTER — Telehealth: Payer: Self-pay | Admitting: *Deleted

## 2014-08-22 NOTE — Telephone Encounter (Signed)
Prior authorization initiated for Contrave. Awaiting determination. JG//CMA 

## 2014-08-23 ENCOUNTER — Other Ambulatory Visit: Payer: Self-pay | Admitting: Family Medicine

## 2014-08-23 NOTE — Telephone Encounter (Signed)
Last seen 01/02/14 and filled 03/31/14 #30 UDS 09/23/13 low risk  Please advise     KP

## 2014-08-24 MED ORDER — ALPRAZOLAM 0.5 MG PO TABS: 0.5000 mg | ORAL_TABLET | Freq: Three times a day (TID) | ORAL | Status: DC | PRN

## 2014-08-24 NOTE — Addendum Note (Signed)
Addended by: Arnette NorrisPAYNE, Shaqueta Casady P on: 08/24/2014 11:34 AM   Modules accepted: Orders

## 2014-09-04 ENCOUNTER — Encounter: Payer: Self-pay | Admitting: Medical

## 2014-09-04 ENCOUNTER — Ambulatory Visit (INDEPENDENT_AMBULATORY_CARE_PROVIDER_SITE_OTHER): Payer: BC Managed Care – PPO | Admitting: Medical

## 2014-09-04 VITALS — BP 128/82 | HR 101 | Temp 98.7°F | Ht 65.5 in | Wt 176.4 lb

## 2014-09-04 DIAGNOSIS — F909 Attention-deficit hyperactivity disorder, unspecified type: Secondary | ICD-10-CM

## 2014-09-04 DIAGNOSIS — J029 Acute pharyngitis, unspecified: Secondary | ICD-10-CM | POA: Insufficient documentation

## 2014-09-04 DIAGNOSIS — F988 Other specified behavioral and emotional disorders with onset usually occurring in childhood and adolescence: Secondary | ICD-10-CM | POA: Insufficient documentation

## 2014-09-04 LAB — POCT RAPID STREP A (OFFICE): Rapid Strep A Screen: NEGATIVE

## 2014-09-04 MED ORDER — AZITHROMYCIN 250 MG PO TABS: ORAL_TABLET | ORAL | Status: DC

## 2014-09-04 MED ORDER — AMPHETAMINE-DEXTROAMPHET ER 30 MG PO CP24: 60.0000 mg | ORAL_CAPSULE | ORAL | Status: DC

## 2014-09-04 NOTE — Assessment & Plan Note (Signed)
Your strep test was negative. However, your physical exam and clinical presentation is suspicious for strep and it is important to note that rapid strep test can be falsely negative. So I am going to give you azithromycin  antibiotic today based on your exam and clinical presentation.  If you don't respond to the antibiotic then notify me and will get mono studies.

## 2014-09-04 NOTE — Progress Notes (Signed)
Pre visit review using our clinic review tool, if applicable. No additional management support is needed unless otherwise documented below in the visit note. 

## 2014-09-04 NOTE — Patient Instructions (Addendum)
Your strep test was negative. However, your physical exam and clinical presentation is suspicious for strep and it is important to note that rapid strep test can be falsely negative. So I am going to give you azithromycin  antibiotic today based on your exam and clinical presentation.  If you don't respond to the antibiotic then notify me and will get mono studies.  For you ADD. I will rx 1 month refill of your adderal  Rest hydrate, tylenol for fever, and warm salt water gargles.   Follow up in 7 days or as needed.

## 2014-09-04 NOTE — Assessment & Plan Note (Signed)
For you ADD. I will rx 1 month refill of your adderal. She brough this up at the very end. She stats stable with no side effects. So I rx'd. Further refills needs to get from pcp.

## 2014-09-04 NOTE — Progress Notes (Signed)
Subjective:    Patient ID: Jillian Green, female    DOB: Dec 13, 1970, 44 y.o.   MRN: [id number]  HPI   Pt has sore throat for 3 weeks. Daughter had virus for 3 weeks.  Associated symptom.  Body aches-yes Fever- no Chills-no HA-yes Neck symptoms-no Lymph node enlargement-no Rash-no Painful swallowing- yes. 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  Pt concerned she has mono. Associated symptom.  Pt had mono 12 yrs ago.  LMP- within a month.    Review of Systems  Constitutional: Negative for fever and chills.  HENT: Positive for sore throat.   Respiratory: Negative for cough and chest tightness.   Cardiovascular: Negative for chest pain and palpitations.  Gastrointestinal: Negative for nausea, vomiting and abdominal pain.  Genitourinary: Negative.   Musculoskeletal: Negative for back pain.  Hematological: Negative for adenopathy. Does not bruise/bleed easily.  Psychiatric/Behavioral: Positive for decreased concentration.   Past Medical History  Diagnosis Date  . Depression   . ADD (attention deficit disorder)   . Migraine   . Unspecified eustachian tube disorder     History   Social History  . Marital Status: Married    Spouse Name: N/A    Number of Children: N/A  . Years of Education: N/A   Occupational History  . [employer]    Social History Main Topics  . Smoking status: Never Smoker   . Smokeless tobacco: Never Used  . Alcohol Use: No  . Drug Use: No  . Sexual Activity:    Partners: Male   Other Topics Concern  . Not on file   Social History Narrative   Exercise--  Yes --until she had stress fracture    No past surgical history on file.  Family History  Problem Relation Age of Onset  . ADD / ADHD Sister   . Other Father     Dyslexia  . Heart disease Father   . Hypertension Father   . Hypertension Mother     Allergies  Allergen Reactions  . Amoxicillin-Pot Clavulanate     REACTION:  Causes Yeast infection    Current Outpatient Prescriptions on File Prior to Visit  Medication Sig Dispense Refill  . ALPRAZolam (XANAX) 0.5 MG tablet Take 1 tablet (0.5 mg total) by mouth 3 (three) times daily as needed. for anxiety 30 tablet 0  . amphetamine-dextroamphetamine (ADDERALL XR) 30 MG 24 hr capsule 2 po qd 60 capsule 0  . amphetamine-dextroamphetamine (ADDERALL XR) 30 MG 24 hr capsule 2 po qam 60 capsule 0  . amphetamine-dextroamphetamine (ADDERALL XR) 30 MG 24 hr capsule 2 po qam 60 capsule 0  . cetirizine (ZYRTEC) 10 MG tablet Take 10 mg by mouth daily.    . Cyanocobalamin 1000 MCG/ML KIT 31m SQ weekly x4 then 1x a month 1 kit 11  . fluconazole (DIFLUCAN) 150 MG tablet 1 po qd x1, may repeat in 3 days prn 2 tablet 5  . fluticasone (FLONASE) 50 MCG/ACT nasal spray INSTILL 2 SPRAYS INTO EACH NOSTRIL EVERY DAY 16 g 2  . meloxicam (MOBIC) 15 MG tablet Take 0.5-1 tablets (7.5-15 mg total) by mouth daily. Office visit due now 30 tablet 0  . Naltrexone-Bupropion HCl ER 8-90 MG TB12 2 po bid (Patient not taking: Reported on 09/04/2014) 120 tablet 5  . Norethin Ace-Eth Estrad-FE (LOESTRIN 24 FE) 1-20 MG-MCG(24) TABS Take 1 tablet by mouth daily.      . predniSONE (DELTASONE) 10 MG tablet 3  po qd for 3 days then 2 po qd for 3 days the 1 po qd for 3 days (Patient not taking: Reported on 09/04/2014) 18 tablet 0  . sertraline (ZOLOFT) 50 MG tablet Take 1 tablet (50 mg total) by mouth daily. 3 po qd 90 tablet 11  . Sulfacetamide Sodium-Sulfur 10-5 % EMUL      No current facility-administered medications on file prior to visit.    BP 128/82 mmHg  Pulse 101  Temp(Src) 98.7 F (37.1 C) (Oral)  Ht 5' 5.5" (1.664 m)  Wt 176 lb 6.4 oz (80.015 kg)  BMI 28.90 kg/m2  SpO2 97%  LMP 08/04/2014      Objective:   Physical Exam   General- No acute distress, pleasant pt.  Neck- from, No nuccal rigidity, Mild submandibular node hypertrophy.  Lungs- Clear even and unlabored.  Heart- Regular,  rate and rhythm. HEENT- Head- normocephalic Eyes- PEERL bilaterally. Ears- Canals clear, normal tm's bilaterally. Nose- No frontal or maxillary sinus tenderness to palpation. Turbinates normal. Throat- posterior pharynx shows   tonsillar hypertrophy plus,  erythma,  discharge.   Neurologic- CN III- XII grossly intact.  Abdomen- soft, nontender, nondistended + bowel sound. No rebound or guarding.No organomegaly.        Assessment & Plan:

## 2014-09-05 ENCOUNTER — Encounter: Payer: Self-pay | Admitting: Family Medicine

## 2014-09-15 ENCOUNTER — Ambulatory Visit (INDEPENDENT_AMBULATORY_CARE_PROVIDER_SITE_OTHER): Payer: BC Managed Care – PPO | Admitting: Family Medicine

## 2014-09-15 ENCOUNTER — Encounter: Payer: Self-pay | Admitting: Family Medicine

## 2014-09-15 VITALS — BP 112/78 | HR 80 | Temp 98.5°F | Wt 181.2 lb

## 2014-09-15 DIAGNOSIS — N760 Acute vaginitis: Secondary | ICD-10-CM

## 2014-09-15 DIAGNOSIS — J0110 Acute frontal sinusitis, unspecified: Secondary | ICD-10-CM

## 2014-09-15 MED ORDER — MOXIFLOXACIN HCL 400 MG PO TABS: 400.0000 mg | ORAL_TABLET | Freq: Every day | ORAL | Status: DC

## 2014-09-15 MED ORDER — FLUCONAZOLE 150 MG PO TABS: ORAL_TABLET | ORAL | Status: DC

## 2014-09-15 MED ORDER — METHYLPREDNISOLONE ACETATE 80 MG/ML IJ SUSP
80.0000 mg | Freq: Once | INTRAMUSCULAR | Status: AC
  Administered 2014-09-15: 80 mg via INTRAMUSCULAR

## 2014-09-15 MED ORDER — PREDNISONE 10 MG PO TABS: ORAL_TABLET | ORAL | Status: DC

## 2014-09-15 NOTE — Patient Instructions (Signed)

## 2014-09-15 NOTE — Addendum Note (Signed)
Addended by: Arnette NorrisPAYNE, Charlott Calvario P on: 09/15/2014 03:42 PM   Modules accepted: Orders

## 2014-09-15 NOTE — Progress Notes (Signed)
Pre visit review using our clinic review tool, if applicable. No additional management support is needed unless otherwise documented below in the visit note. 

## 2014-09-15 NOTE — Progress Notes (Signed)
  Subjective:     Jillian Green is a 44 y.o. female who presents for evaluation of sinus pain. Symptoms include: congestion, cough, facial pain, foul rhinorrhea, headaches, nasal congestion, purulent rhinorrhea, sinus pressure and sore throat. Onset of symptoms was 4 weeks ago. Symptoms have been gradually worsening since that time. Past history is significant for no history of pneumonia or bronchitis. Patient is a non-smoker.  The following portions of the patient's history were reviewed and updated as appropriate: allergies, current medications, past family history, past medical history, past social history, past surgical history and problem list.  Review of Systems Pertinent items are noted in HPI.   Objective:    BP 112/78 mmHg  Pulse 80  Temp(Src) 98.5 F (36.9 C) (Oral)  Wt 181 lb 3.2 oz (82.192 kg)  SpO2 98%  LMP 08/04/2014 General appearance: alert, cooperative, appears stated age and no distress Ears: normal TM's and external ear canals both ears Nose: green discharge, moderate congestion, turbinates red, swollen, sinus tenderness bilateral Throat: lips, mucosa, and tongue normal; teeth and gums normal Neck: moderate anterior cervical adenopathy, supple, symmetrical, trachea midline and thyroid not enlarged, symmetric, no tenderness/mass/nodules Lungs: clear to auscultation bilaterally Heart: S1, S2 normal    Assessment:    Acute bacterial sinusitis.    Plan:    Nasal steroids per medication orders. Antihistamines per medication orders. avelox per medication orders.

## 2014-09-19 ENCOUNTER — Other Ambulatory Visit: Payer: Self-pay | Admitting: Family Medicine

## 2014-10-04 ENCOUNTER — Emergency Department
Admission: EM | Admit: 2014-10-04 | Discharge: 2014-10-04 | Disposition: A | Discharge status: Home / Self Care | Payer: BC Managed Care – PPO | Source: Home / Self Care | Attending: Family Medicine | Admitting: Family Medicine

## 2014-10-04 ENCOUNTER — Encounter: Payer: Self-pay | Admitting: Emergency Medicine

## 2014-10-04 ENCOUNTER — Emergency Department (INDEPENDENT_AMBULATORY_CARE_PROVIDER_SITE_OTHER): Payer: BC Managed Care – PPO

## 2014-10-04 DIAGNOSIS — S9031XA Contusion of right foot, initial encounter: Secondary | ICD-10-CM

## 2014-10-04 DIAGNOSIS — M25571 Pain in right ankle and joints of right foot: Secondary | ICD-10-CM

## 2014-10-04 DIAGNOSIS — W1839XA Other fall on same level, initial encounter: Secondary | ICD-10-CM

## 2014-10-04 DIAGNOSIS — S93601A Unspecified sprain of right foot, initial encounter: Secondary | ICD-10-CM

## 2014-10-04 NOTE — ED Provider Notes (Signed)
CSN: [id number]     Arrival date & time 10/04/14  1534 History   First MD Initiated Contact with Patient 10/04/14 1615     Chief Complaint  Patient presents with  . Foot Injury      HPI Comments: Patient fell yesterday, injuring the top of her right foot.  Patient is a 44 y.o. female presenting with foot injury. The history is provided by the patient.  Foot Injury Location:  Foot Time since incident:  1 day Injury: yes   Mechanism of injury: fall   Fall:    Fall occurred:  Tripped Foot location:  R foot Pain details:    Quality:  Aching   Radiates to:  Does not radiate   Severity:  Mild   Onset quality:  Sudden   Duration:  1 day   Timing:  Constant   Progression:  Unchanged Chronicity:  New Prior injury to area:  No Relieved by: elevation. Worsened by:  Bearing weight Ineffective treatments:  Ice Associated symptoms: stiffness and swelling   Associated symptoms: no back pain, no decreased ROM, no muscle weakness, no numbness and no tingling     Past Medical History  Diagnosis Date  . Depression   . ADD (attention deficit disorder)   . Migraine   . Unspecified eustachian tube disorder    Past Surgical History  Procedure Laterality Date  . Sinus exploration     Family History  Problem Relation Age of Onset  . ADD / ADHD Sister   . Other Father     Dyslexia  . Heart disease Father   . Hypertension Father   . Hypertension Mother    History  Substance Use Topics  . Smoking status: Never Smoker   . Smokeless tobacco: Never Used  . Alcohol Use: 0.6 oz/week    1 Glasses of wine per week   OB History    No data available     Review of Systems  Musculoskeletal: Positive for stiffness. Negative for back pain.  All other systems reviewed and are negative.   Allergies  Amoxicillin-pot clavulanate  Home Medications   Prior to Admission medications   Medication Sig Start Date End Date Taking? Authorizing Provider  ALPRAZolam Duanne Moron) 0.5 MG tablet Take 1  tablet (0.5 mg total) by mouth 3 (three) times daily as needed. for anxiety 08/24/14   Rosalita Chessman, DO  amphetamine-dextroamphetamine (ADDERALL XR) 30 MG 24 hr capsule 2 po qd 03/27/14   Rosalita Chessman, DO  amphetamine-dextroamphetamine (ADDERALL XR) 30 MG 24 hr capsule 2 po qam 03/27/14   Rosalita Chessman, DO  amphetamine-dextroamphetamine (ADDERALL XR) 30 MG 24 hr capsule 2 po qam 03/27/14   Rosalita Chessman, DO  amphetamine-dextroamphetamine (ADDERALL XR) 30 MG 24 hr capsule Take 2 capsules (60 mg total) by mouth every morning. 09/04/14   [place], PA-C  cetirizine (ZYRTEC) 10 MG tablet Take 10 mg by mouth daily.    Historical Provider, MD  Cyanocobalamin 1000 MCG/ML KIT 12m SQ weekly x4 then 1x a month 12/02/13   YRosalita Chessman DO  fluconazole (DIFLUCAN) 150 MG tablet 1 po qd x 1 , may repeat in 3 days prn 09/15/14   YRosalita Chessman DO  fluticasone (FLONASE) 50 MCG/ACT nasal spray INSTILL 2 SPRAYS INTO EACH NOSTRIL EVERY DAY 07/21/14   YRosalita Chessman DO  meloxicam (MOBIC) 15 MG tablet TAKE 1/2 TO 1 TABLET BY MOUTH EVERY DAY 09/19/14   YRosalita Chessman DO  moxifloxacin (AVELOX) 400 MG tablet Take 1 tablet (400 mg total) by mouth daily. 09/15/14   Rosalita Chessman, DO  Naltrexone-Bupropion HCl ER 8-90 MG TB12 2 po bid 02/16/14   Rosalita Chessman, DO  Norethin Ace-Eth Estrad-FE (LOESTRIN 24 FE) 1-20 MG-MCG(24) TABS Take 1 tablet by mouth daily.      Historical Provider, MD  predniSONE (DELTASONE) 10 MG tablet 3 po qd for 3 days then 2 po qd for 3 days the 1 po qd for 3 days 09/15/14   Rosalita Chessman, DO  sertraline (ZOLOFT) 50 MG tablet Take 1 tablet (50 mg total) by mouth daily. 3 po qd 12/02/13   Rosalita Chessman, DO  Sulfacetamide Sodium-Sulfur 10-5 % EMUL  05/16/13   Historical Provider, MD   BP 126/86 mmHg  Pulse 89  Temp(Src) 97.8 F (36.6 C) (Oral)  Wt 187 lb (84.823 kg)  SpO2 97%  LMP 08/04/2014 Physical Exam  Constitutional: She is oriented to person, place, and time. She appears well-developed  and well-nourished. No distress.  HENT:  Head: Atraumatic.  Eyes: Pupils are equal, round, and reactive to light.  Musculoskeletal:       Right foot: There is tenderness, bony tenderness and swelling. There is normal range of motion, normal capillary refill, no deformity and no laceration.       Feet:  Right foot has ecchymosis, mild swelling, and tenderness dorsally.  Distal neurovascular function is intact.   Neurological: She is alert and oriented to person, place, and time.  Skin: Skin is warm and dry. No rash noted.  Nursing note and vitals reviewed.   ED Course  Procedures  none    Imaging Review Dg Foot Complete Right  10/04/2014   CLINICAL DATA:  The patient tripped several days ago with pain and bruising about the right foot. Initial encounter.  EXAM: RIGHT FOOT COMPLETE - 3+ VIEW  COMPARISON:  None.  FINDINGS: Imaged bones, joints and soft tissues appear normal.  IMPRESSION: Negative exam.   Electronically Signed   By: Inge Rise M.D.   On: 10/04/2014 16:24     MDM   1. Right foot sprain, initial encounter    Ace wrap applied. Apply ice pack for 30 minutes every 1 to 2 hours today and tomorrow.  Elevate.  Wear Ace wrap until swelling decreases.  Begin range of motion and stretching exercises in about 5 days as per instruction sheet.  Take Ibuprofen 257m, 4 tabs every 8 hours with food.  May take Tylenol for pain if needed. [facility] if not improving about two weeks.     SKandra Nicolas MD 10/06/14 [phone]

## 2014-10-04 NOTE — ED Notes (Signed)
Pt c/o falling yesterday and hurting her right foot. States it is only painful on the top of the foot./

## 2014-10-04 NOTE — Discharge Instructions (Signed)
Apply ice pack for 30 minutes every 1 to 2 hours today and tomorrow.  Elevate.  Wear Ace wrap until swelling decreases.  Begin range of motion and stretching exercises in about 5 days as per instruction sheet.  Take Ibuprofen 200mg , 4 tabs every 8 hours with food.  May take Tylenol for pain if needed.

## 2014-10-10 ENCOUNTER — Ambulatory Visit (INDEPENDENT_AMBULATORY_CARE_PROVIDER_SITE_OTHER): Payer: BC Managed Care – PPO | Admitting: Family Medicine

## 2014-10-10 ENCOUNTER — Encounter: Payer: Self-pay | Admitting: Family Medicine

## 2014-10-10 VITALS — BP 120/76 | HR 104 | Temp 98.6°F | Wt 187.0 lb

## 2014-10-10 DIAGNOSIS — S93601A Unspecified sprain of right foot, initial encounter: Secondary | ICD-10-CM

## 2014-10-10 DIAGNOSIS — F988 Other specified behavioral and emotional disorders with onset usually occurring in childhood and adolescence: Secondary | ICD-10-CM

## 2014-10-10 DIAGNOSIS — F909 Attention-deficit hyperactivity disorder, unspecified type: Secondary | ICD-10-CM

## 2014-10-10 NOTE — Assessment & Plan Note (Signed)
con't ice and heat If it does not con't to improve --- will refer to [facility]

## 2014-10-10 NOTE — Assessment & Plan Note (Signed)
Pt is requesting formal testing to be done because the meds seemed to stop working [facility]

## 2014-10-10 NOTE — Patient Instructions (Signed)

## 2014-10-10 NOTE — Progress Notes (Signed)
Subjective:    Patient ID: Jillian Green, female    DOB: 02/29/1971, 44 y.o.   MRN: [id number]  HPI  Patient here for f/u R foot sprain-- she was seen at [facility] and xray was done.  Incident occurred last week Tuesday.  She tripped and fell.   Pt also wants to switch her adderall to something else.   She feels like it does nothing.  Past Medical History  Diagnosis Date  . Depression   . ADD (attention deficit disorder)   . Migraine   . Unspecified eustachian tube disorder     Review of Systems  Constitutional: Negative for activity change, appetite change, fatigue and unexpected weight change.  Respiratory: Negative for cough and shortness of breath.   Cardiovascular: Negative for chest pain and palpitations.  Musculoskeletal: Positive for joint swelling.       R foot --- + bruising on top of foot across metatarsals and lat ---swelling has improved per pt  Psychiatric/Behavioral: Negative for behavioral problems and dysphoric mood. The patient is not nervous/anxious.     Current Outpatient Prescriptions on File Prior to Visit  Medication Sig Dispense Refill  . ALPRAZolam (XANAX) 0.5 MG tablet Take 1 tablet (0.5 mg total) by mouth 3 (three) times daily as needed. for anxiety 30 tablet 0  . cetirizine (ZYRTEC) 10 MG tablet Take 10 mg by mouth daily.    . Cyanocobalamin 1000 MCG/ML KIT 61m SQ weekly x4 then 1x a month 1 kit 11  . fluticasone (FLONASE) 50 MCG/ACT nasal spray INSTILL 2 SPRAYS INTO EACH NOSTRIL EVERY DAY 16 g 2  . meloxicam (MOBIC) 15 MG tablet TAKE 1/2 TO 1 TABLET BY MOUTH EVERY DAY 30 tablet 0  . Norethin Ace-Eth Estrad-FE (LOESTRIN 24 FE) 1-20 MG-MCG(24) TABS Take 1 tablet by mouth daily.      . sertraline (ZOLOFT) 50 MG tablet Take 1 tablet (50 mg total) by mouth daily. 3 po qd 90 tablet 11  . Sulfacetamide Sodium-Sulfur 10-5 % EMUL      No current facility-administered medications on file prior to visit.       Objective:    Physical Exam    Constitutional: She is oriented to person, place, and time. She appears well-developed and well-nourished. No distress.  HENT:  Right Ear: External ear normal.  Left Ear: External ear normal.  Nose: Nose normal.  Mouth/Throat: Oropharynx is clear and moist.  Eyes: EOM are normal. Pupils are equal, round, and reactive to light.  Neck: Normal range of motion. Neck supple.  Cardiovascular: Normal rate, regular rhythm and normal heart sounds.   No murmur heard. Pulmonary/Chest: Effort normal and breath sounds normal. No respiratory distress. She has no wheezes. She has no rales. She exhibits no tenderness.  Musculoskeletal:  R foot-- top metatarsals and lat -- black and blue  Neurological: She is alert and oriented to person, place, and time.  Psychiatric: She has a normal mood and affect. Her behavior is normal. Judgment and thought content normal.    BP 120/76 mmHg  Pulse 104  Temp(Src) 98.6 F (37 C) (Oral)  Wt 187 lb (84.823 kg)  SpO2 97% Wt Readings from Last 3 Encounters:  10/10/14 187 lb (84.823 kg)  10/04/14 187 lb (84.823 kg)  09/15/14 181 lb 3.2 oz (82.192 kg)     Lab Results  Component Value Date   WBC 5.6 09/23/2013   HGB 13.7 09/23/2013   HCT 41.0 09/23/2013   PLT 267.0 09/23/2013  GLUCOSE 161* 09/23/2013   ALT 19 09/23/2013   AST 16 09/23/2013   NA 137 09/23/2013   K 3.4* 09/23/2013   CL 104 09/23/2013   CREATININE 0.7 09/23/2013   BUN 8 09/23/2013   CO2 26 09/23/2013   TSH 0.99 09/23/2013       Assessment & Plan:   Problem List Items Addressed This Visit      Unprioritized   Right foot sprain    con't ice and heat If it does not con't to improve --- will refer to sport med      ADD (attention deficit disorder) - Primary    Pt is requesting formal testing to be done because the meds seemed to stop working Referral put in for [employer] [facility]      Relevant Orders   Ambulatory referral to Psychology      I have discontinued Ms.  Halm's Naltrexone-Bupropion HCl ER, amphetamine-dextroamphetamine, amphetamine-dextroamphetamine, amphetamine-dextroamphetamine, amphetamine-dextroamphetamine, moxifloxacin, predniSONE, and fluconazole. I am also having her maintain her Norethindrone Acetate-Ethinyl Estrad-FE, cetirizine, Sulfacetamide Sodium-Sulfur, sertraline, Cyanocobalamin, fluticasone, ALPRAZolam, and meloxicam.  No orders of the defined types were placed in this encounter.     Garnet Koyanagi, DO

## 2014-10-10 NOTE — Progress Notes (Signed)
Pre visit review using our clinic review tool, if applicable. No additional management support is needed unless otherwise documented below in the visit note. 

## 2014-10-22 ENCOUNTER — Other Ambulatory Visit: Payer: Self-pay | Admitting: Family Medicine

## 2014-10-23 ENCOUNTER — Other Ambulatory Visit: Payer: Self-pay | Admitting: Family Medicine

## 2014-10-30 ENCOUNTER — Ambulatory Visit (INDEPENDENT_AMBULATORY_CARE_PROVIDER_SITE_OTHER): Payer: BC Managed Care – PPO | Admitting: Psychology

## 2014-10-30 DIAGNOSIS — F3342 Major depressive disorder, recurrent, in full remission: Secondary | ICD-10-CM | POA: Diagnosis not present

## 2014-10-30 DIAGNOSIS — F900 Attention-deficit hyperactivity disorder, predominantly inattentive type: Secondary | ICD-10-CM

## 2014-10-31 ENCOUNTER — Telehealth: Payer: Self-pay | Admitting: Family Medicine

## 2014-10-31 NOTE — Telephone Encounter (Signed)
Caller name: Jenisa Relation to pt: self Call back number: [phone] Pharmacy:  Reason for call:   Patient states that she does want to try Strattera for her ADHD. She states that her and Dr. Laury AxonLowne have already talked about this.

## 2014-10-31 NOTE — Telephone Encounter (Signed)
Lilly Rep [employer] CorporationMichaelene Greenly called at [phone] and VM left advising we would need a starter pack for the Strattera. The patient has been made aware and verbalized understanding, I will call when we have the sample pack for her to pick up.     KP

## 2014-10-31 NOTE — Telephone Encounter (Signed)
Please advise      KP 

## 2014-10-31 NOTE — Telephone Encounter (Signed)
Please call rep for start pack----then we can call in the con't dose

## 2014-11-02 NOTE — Telephone Encounter (Signed)
Spoke with Rep who will be in the office at lunch. Message left for the patient to pick up the script after 2 pm.      KP

## 2014-11-11 ENCOUNTER — Other Ambulatory Visit: Payer: Self-pay | Admitting: Family Medicine

## 2014-11-12 ENCOUNTER — Other Ambulatory Visit: Payer: Self-pay | Admitting: Family Medicine

## 2014-11-15 ENCOUNTER — Ambulatory Visit (INDEPENDENT_AMBULATORY_CARE_PROVIDER_SITE_OTHER): Payer: BC Managed Care – PPO | Admitting: Psychology

## 2014-11-15 DIAGNOSIS — F900 Attention-deficit hyperactivity disorder, predominantly inattentive type: Secondary | ICD-10-CM | POA: Diagnosis not present

## 2014-11-15 DIAGNOSIS — F3342 Major depressive disorder, recurrent, in full remission: Secondary | ICD-10-CM

## 2014-12-14 ENCOUNTER — Telehealth: Payer: Self-pay | Admitting: Family Medicine

## 2014-12-14 MED ORDER — ATOMOXETINE HCL 80 MG PO CAPS: 80.0000 mg | ORAL_CAPSULE | Freq: Every day | ORAL | Status: DC

## 2014-12-14 NOTE — Telephone Encounter (Signed)
Relation to pt: self  Call back number: [phone] [facility]:  [id number] [place] [address] [place] [address] [facility] [address] [phone] (Phone) [phone] (Fax)         Reason for call:  Pt requesting a refill straterra 80mg , pt states MD gave pt a starter pack. Does not reflect med list.  Please advise

## 2014-12-14 NOTE — Telephone Encounter (Signed)
Please advise if it is ok to start filling this Rx.      KP

## 2014-12-14 NOTE — Telephone Encounter (Signed)
Ok to fill straterra-- 80 mg daily #30  5 refills

## 2014-12-15 ENCOUNTER — Other Ambulatory Visit: Payer: Self-pay | Admitting: Family Medicine

## 2014-12-15 NOTE — Telephone Encounter (Signed)
Prior authorization for [employer] initiated. Awaiting determination. JG//CMA

## 2014-12-18 ENCOUNTER — Encounter: Payer: Self-pay | Admitting: Family Medicine

## 2014-12-19 NOTE — Telephone Encounter (Signed)
PA approved effective 11/27/14-12/18/2015

## 2015-02-21 ENCOUNTER — Encounter: Payer: Self-pay | Admitting: Family Medicine

## 2015-02-22 ENCOUNTER — Other Ambulatory Visit: Payer: Self-pay | Admitting: Family Medicine

## 2015-02-22 MED ORDER — LORCASERIN HCL 10 MG PO TABS: ORAL_TABLET | ORAL | Status: DC

## 2015-03-16 ENCOUNTER — Ambulatory Visit (INDEPENDENT_AMBULATORY_CARE_PROVIDER_SITE_OTHER): Payer: BC Managed Care – PPO | Admitting: Family Medicine

## 2015-03-16 ENCOUNTER — Encounter: Payer: Self-pay | Admitting: Family Medicine

## 2015-03-16 ENCOUNTER — Ambulatory Visit: Payer: BC Managed Care – PPO | Admitting: Family Medicine

## 2015-03-16 ENCOUNTER — Telehealth: Payer: Self-pay | Admitting: Family Medicine

## 2015-03-16 VITALS — BP 100/70 | HR 100 | Temp 98.4°F | Wt 185.4 lb

## 2015-03-16 DIAGNOSIS — J0110 Acute frontal sinusitis, unspecified: Secondary | ICD-10-CM | POA: Diagnosis not present

## 2015-03-16 MED ORDER — FLUCONAZOLE 150 MG PO TABS: 150.0000 mg | ORAL_TABLET | Freq: Once | ORAL | Status: DC

## 2015-03-16 MED ORDER — LORCASERIN HCL 10 MG PO TABS: ORAL_TABLET | ORAL | Status: DC

## 2015-03-16 MED ORDER — ALPRAZOLAM 0.5 MG PO TABS: 0.5000 mg | ORAL_TABLET | Freq: Three times a day (TID) | ORAL | Status: DC | PRN

## 2015-03-16 MED ORDER — GUAIFENESIN-CODEINE 100-10 MG/5ML PO SYRP: 5.0000 mL | ORAL_SOLUTION | Freq: Every evening | ORAL | Status: DC | PRN

## 2015-03-16 MED ORDER — ATOMOXETINE HCL 100 MG PO CAPS: 100.0000 mg | ORAL_CAPSULE | Freq: Every day | ORAL | Status: DC

## 2015-03-16 MED ORDER — CEFUROXIME AXETIL 500 MG PO TABS: 500.0000 mg | ORAL_TABLET | Freq: Two times a day (BID) | ORAL | Status: DC

## 2015-03-16 NOTE — Progress Notes (Signed)
Pre visit review using our clinic review tool, if applicable. No additional management support is needed unless otherwise documented below in the visit note. 

## 2015-03-16 NOTE — Telephone Encounter (Signed)
Pt no show for 03/16/15 9:00am appt, pt called 8/12 8:48am overslept bc she took benadryl, scheduled her for same day at 2:00pm, advised she may be charge $50 for no show. Charge?

## 2015-03-16 NOTE — Progress Notes (Signed)
Patient ID: Jillian Green, female   DOB: 08-18-1970, 44 y.o.   MRN: [id number]   Subjective:    Patient ID: Jillian Green, female    DOB: Aug 26, 1970, 44 y.o.   MRN: [id number]  Chief Complaint  Patient presents with  . Sinusitis    x's 3 weeks with cough, congestion and HA    HPI Patient is in today for c/o sinus congestion x 2-3 weeks.     Past Medical History  Diagnosis Date  . Depression   . ADD (attention deficit disorder)   . Migraine   . Unspecified eustachian tube disorder     Past Surgical History  Procedure Laterality Date  . Sinus exploration      Family History  Problem Relation Age of Onset  . ADD / ADHD Sister   . Other Father     Dyslexia  . Heart disease Father   . Hypertension Father   . Hypertension Mother     Social History   Social History  . Marital Status: Married    Spouse Name: N/A  . Number of Children: N/A  . Years of Education: N/A   Occupational History  . [employer]    Social History Main Topics  . Smoking status: Never Smoker   . Smokeless tobacco: Never Used  . Alcohol Use: 0.6 oz/week    1 Glasses of wine per week  . Drug Use: No  . Sexual Activity:    Partners: Male   Other Topics Concern  . Not on file   Social History Narrative   Exercise--  Yes --until she had stress fracture    Outpatient Prescriptions Prior to Visit  Medication Sig Dispense Refill  . ALPRAZolam (XANAX) 0.5 MG tablet Take 1 tablet (0.5 mg total) by mouth 3 (three) times daily as needed. for anxiety 30 tablet 0  . atomoxetine (STRATTERA) 80 MG capsule Take 1 capsule (80 mg total) by mouth daily. 30 capsule 5  . cetirizine (ZYRTEC) 10 MG tablet Take 10 mg by mouth daily.    . fluticasone (FLONASE) 50 MCG/ACT nasal spray INSTILL 2 SPRAYS INTO EACH NOSTRIL DAILY 16 g 5  . Lorcaserin HCl (BELVIQ) 10 MG TABS 1 PO BID 60 tablet 0  . meloxicam (MOBIC) 15 MG tablet TAKE 1/2 TO 1 TABLET BY MOUTH EVERY DAY 30 tablet 2  . sertraline  (ZOLOFT) 50 MG tablet TAKE 3 TABLETS BY MOUTH DAILY 90 tablet 5  . Sulfacetamide Sodium-Sulfur 10-5 % EMUL     . cyanocobalamin (,VITAMIN B-12,) 1000 MCG/ML injection Inject 1 mL (1,000 mcg total) into the muscle every 30 (thirty) days. Repeat labs are due now 1 mL 0  . Norethin Ace-Eth Estrad-FE (LOESTRIN 24 FE) 1-20 MG-MCG(24) TABS Take 1 tablet by mouth daily.       No facility-administered medications prior to visit.    Allergies  Allergen Reactions  . Amoxicillin-Pot Clavulanate     REACTION: Causes Yeast infection    Review of Systems  Constitutional: Negative for fever and malaise/fatigue.  HENT: Positive for congestion.   Eyes: Negative for discharge.  Respiratory: Positive for cough. Negative for shortness of breath.   Cardiovascular: Negative for chest pain, palpitations and leg swelling.  Gastrointestinal: Negative for nausea and abdominal pain.  Genitourinary: Negative for dysuria.  Musculoskeletal: Negative for falls.  Skin: Negative for rash.  Neurological: Positive for headaches. Negative for loss of consciousness.  Endo/Heme/Allergies: Negative for environmental allergies.  Psychiatric/Behavioral: Negative for depression. The patient  is not nervous/anxious.        Objective:    Physical Exam  Constitutional: She is oriented to person, place, and time. She appears well-developed and well-nourished.  HENT:  Right Ear: Hearing, tympanic membrane, external ear and ear canal normal.  Left Ear: Hearing, tympanic membrane, external ear and ear canal normal.  Nose: Sinus tenderness present. Right sinus exhibits maxillary sinus tenderness and frontal sinus tenderness. Left sinus exhibits maxillary sinus tenderness and frontal sinus tenderness.  Mouth/Throat: Posterior oropharyngeal erythema present.  + PND + errythema  Eyes: Conjunctivae are normal. Right eye exhibits no discharge. Left eye exhibits no discharge.  Neck: Normal range of motion. Neck supple.    Cardiovascular: Normal rate, regular rhythm and normal heart sounds.   No murmur heard. Pulmonary/Chest: Effort normal and breath sounds normal. No respiratory distress. She has no wheezes. She has no rales. She exhibits no tenderness.  Musculoskeletal: She exhibits no edema.  Lymphadenopathy:    She has cervical adenopathy.  Neurological: She is alert and oriented to person, place, and time.  Psychiatric: She has a normal mood and affect. Her behavior is normal. Judgment and thought content normal.    BP 100/70 mmHg  Pulse 100  Temp(Src) 98.4 F (36.9 C) (Oral)  Wt 185 lb 6.4 oz (84.097 kg)  SpO2 98% Wt Readings from Last 3 Encounters:  03/16/15 185 lb 6.4 oz (84.097 kg)  10/10/14 187 lb (84.823 kg)  10/04/14 187 lb (84.823 kg)     Lab Results  Component Value Date   WBC 5.6 09/23/2013   HGB 13.7 09/23/2013   HCT 41.0 09/23/2013   PLT 267.0 09/23/2013   GLUCOSE 161* 09/23/2013   ALT 19 09/23/2013   AST 16 09/23/2013   NA 137 09/23/2013   K 3.4* 09/23/2013   CL 104 09/23/2013   CREATININE 0.7 09/23/2013   BUN 8 09/23/2013   CO2 26 09/23/2013   TSH 0.99 09/23/2013    Lab Results  Component Value Date   TSH 0.99 09/23/2013   Lab Results  Component Value Date   WBC 5.6 09/23/2013   HGB 13.7 09/23/2013   HCT 41.0 09/23/2013   MCV 95.2 09/23/2013   PLT 267.0 09/23/2013   Lab Results  Component Value Date   NA 137 09/23/2013   K 3.4* 09/23/2013   CO2 26 09/23/2013   GLUCOSE 161* 09/23/2013   BUN 8 09/23/2013   CREATININE 0.7 09/23/2013   BILITOT 0.5 09/23/2013   ALKPHOS 85 09/23/2013   AST 16 09/23/2013   ALT 19 09/23/2013   PROT 7.2 09/23/2013   ALBUMIN 3.9 09/23/2013   CALCIUM 8.9 09/23/2013   GFR 101.21 09/23/2013   No results found for: CHOL No results found for: HDL No results found for: LDLCALC No results found for: TRIG No results found for: CHOLHDL No results found for: ZOXW9U     Assessment & Plan:   Problem List Items Addressed  This Visit    None    Visit Diagnoses    Morbid obesity    -  Primary       I have discontinued Ms. Rodarte's Norethindrone Acetate-Ethinyl Estrad-FE and cyanocobalamin. I am also having her maintain her cetirizine, Sulfacetamide Sodium-Sulfur, ALPRAZolam, fluticasone, meloxicam, sertraline, atomoxetine, Lorcaserin HCl, MINASTRIN 24 FE, and gabapentin.  Meds ordered this encounter  Medications  . MINASTRIN 24 FE 1-20 MG-MCG(24) CHEW    Sig: Chew 1 tablet by mouth daily.  [name] gabapentin (NEURONTIN) 300 MG capsule    Sig:  Take 1 capsule by mouth 3 (three) times daily as needed.     Baird Kay, LPN

## 2015-03-16 NOTE — Telephone Encounter (Signed)
No charge. 

## 2015-03-16 NOTE — Patient Instructions (Signed)

## 2015-04-09 ENCOUNTER — Encounter: Payer: Self-pay | Admitting: Family Medicine

## 2015-04-09 DIAGNOSIS — G43009 Migraine without aura, not intractable, without status migrainosus: Secondary | ICD-10-CM

## 2015-04-10 MED ORDER — RIZATRIPTAN BENZOATE 10 MG PO TBDP: 10.0000 mg | ORAL_TABLET | ORAL | Status: DC | PRN

## 2015-04-10 NOTE — Telephone Encounter (Signed)
Neither of these med's are on her medication list. Please advise      KP

## 2015-04-10 NOTE — Addendum Note (Signed)
Addended by: Lelon Perla on: 04/10/2015 01:15 PM   Modules accepted: Orders

## 2015-04-10 NOTE — Telephone Encounter (Signed)
Please advise      KP 

## 2015-04-10 NOTE — Telephone Encounter (Signed)
maxalt sent to pharmacy

## 2015-04-12 ENCOUNTER — Encounter: Payer: Self-pay | Admitting: Family Medicine

## 2015-04-16 ENCOUNTER — Other Ambulatory Visit: Payer: Self-pay | Admitting: Family Medicine

## 2015-04-17 NOTE — Telephone Encounter (Signed)
Last OV 03/2015.  Last Rx 03/16/15. Last UDS 09/2013?[name] Please advise.  Name from pharmacy:  In chart as:  STRATTERA  (HUNDRED MG) CAPS STRATTERA 100 MG capsule     Sig: TAKE 1 CAPSULE(100 MG) BY MOUTH DAILY    Dispense: 30 capsule   Refills: 0   Start: 04/16/2015   Class: Normal    Requested on: 04/16/2015    Originally ordered on: 03/16/2015 03/16/2015

## 2015-04-23 ENCOUNTER — Other Ambulatory Visit: Payer: Self-pay | Admitting: Family Medicine

## 2015-04-23 NOTE — Telephone Encounter (Signed)
Last seen and filled 03/16/15 #60. Please advise     KP

## 2015-04-24 MED ORDER — LORCASERIN HCL 10 MG PO TABS: 1.0000 | ORAL_TABLET | Freq: Two times a day (BID) | ORAL | Status: DC

## 2015-04-24 NOTE — Telephone Encounter (Signed)
Rx will be faxed once signed.     KP 

## 2015-04-24 NOTE — Addendum Note (Signed)
Addended by: Arnette Norris on: 04/24/2015 08:13 AM   Modules accepted: Orders

## 2015-04-30 ENCOUNTER — Encounter: Payer: Self-pay | Admitting: Family Medicine

## 2015-05-12 ENCOUNTER — Other Ambulatory Visit: Payer: Self-pay | Admitting: Family Medicine

## 2015-05-15 ENCOUNTER — Other Ambulatory Visit: Payer: Self-pay | Admitting: Family Medicine

## 2015-05-27 ENCOUNTER — Encounter: Payer: Self-pay | Admitting: Family Medicine

## 2015-05-28 ENCOUNTER — Other Ambulatory Visit: Payer: Self-pay | Admitting: Family Medicine

## 2015-05-28 DIAGNOSIS — F988 Other specified behavioral and emotional disorders with onset usually occurring in childhood and adolescence: Secondary | ICD-10-CM

## 2015-05-28 MED ORDER — LISDEXAMFETAMINE DIMESYLATE 30 MG PO CAPS: 30.0000 mg | ORAL_CAPSULE | Freq: Every day | ORAL | Status: DC

## 2015-05-28 NOTE — Telephone Encounter (Signed)
vyvanse 30 mg printed---  She will want coupon too rto 1 month to see if we need to adjust meds

## 2015-05-30 ENCOUNTER — Other Ambulatory Visit: Payer: Self-pay | Admitting: Family Medicine

## 2015-06-21 ENCOUNTER — Encounter: Payer: Self-pay | Admitting: Family Medicine

## 2015-06-21 DIAGNOSIS — F988 Other specified behavioral and emotional disorders with onset usually occurring in childhood and adolescence: Secondary | ICD-10-CM

## 2015-06-21 MED ORDER — LISDEXAMFETAMINE DIMESYLATE 40 MG PO CAPS: 40.0000 mg | ORAL_CAPSULE | Freq: Every day | ORAL | Status: DC

## 2015-06-21 MED ORDER — ALPRAZOLAM 0.5 MG PO TABS: 0.5000 mg | ORAL_TABLET | Freq: Three times a day (TID) | ORAL | Status: DC | PRN

## 2015-06-21 NOTE — Telephone Encounter (Signed)
Inc to 40 mg vyvanse

## 2015-06-27 ENCOUNTER — Other Ambulatory Visit: Payer: Self-pay | Admitting: Family Medicine

## 2015-06-27 NOTE — Telephone Encounter (Signed)
Belviq requested Last seen 03/16/15 and filled 05/30/15 #60  Please advise    KP

## 2015-07-20 ENCOUNTER — Encounter: Payer: Self-pay | Admitting: Family Medicine

## 2015-07-26 ENCOUNTER — Ambulatory Visit (INDEPENDENT_AMBULATORY_CARE_PROVIDER_SITE_OTHER): Payer: BC Managed Care – PPO | Admitting: Family Medicine

## 2015-07-26 ENCOUNTER — Encounter: Payer: Self-pay | Admitting: Family Medicine

## 2015-07-26 VITALS — BP 108/70 | HR 90 | Temp 98.5°F | Ht 66.0 in | Wt 184.4 lb

## 2015-07-26 DIAGNOSIS — M797 Fibromyalgia: Secondary | ICD-10-CM | POA: Diagnosis not present

## 2015-07-26 DIAGNOSIS — F909 Attention-deficit hyperactivity disorder, unspecified type: Secondary | ICD-10-CM | POA: Diagnosis not present

## 2015-07-26 DIAGNOSIS — E669 Obesity, unspecified: Secondary | ICD-10-CM | POA: Diagnosis not present

## 2015-07-26 MED ORDER — LISDEXAMFETAMINE DIMESYLATE 50 MG PO CAPS: 50.0000 mg | ORAL_CAPSULE | Freq: Every day | ORAL | Status: DC

## 2015-07-26 MED ORDER — DULOXETINE HCL 60 MG PO CPEP: 60.0000 mg | ORAL_CAPSULE | Freq: Two times a day (BID) | ORAL | Status: DC

## 2015-07-26 MED ORDER — LORCASERIN HCL 10 MG PO TABS: 1.0000 | ORAL_TABLET | Freq: Two times a day (BID) | ORAL | Status: DC

## 2015-07-26 NOTE — Progress Notes (Signed)
Pre visit review using our clinic review tool, if applicable. No additional management support is needed unless otherwise documented below in the visit note. 

## 2015-07-26 NOTE — Progress Notes (Signed)
Patient ID: Jillian Green, female    DOB: 14-May-1971  Age: 44 y.o. MRN: [id number]    Subjective:  Subjective HPI Jillian Green presents for f/u weight, adhd and is discussing weaning off zoloft and straterra.  Pt has been diagnosed with fibromyagia.    Review of Systems  Constitutional: Negative for diaphoresis, appetite change, fatigue and unexpected weight change.  Eyes: Negative for pain, redness and visual disturbance.  Respiratory: Negative for cough, chest tightness, shortness of breath and wheezing.   Cardiovascular: Negative for chest pain, palpitations and leg swelling.  Endocrine: Negative for cold intolerance, heat intolerance, polydipsia, polyphagia and polyuria.  Genitourinary: Negative for dysuria, frequency and difficulty urinating.  Neurological: Negative for dizziness, light-headedness, numbness and headaches.    History Past Medical History  Diagnosis Date  . Depression   . ADD (attention deficit disorder)   . Migraine   . Unspecified eustachian tube disorder     She has past surgical history that includes Sinus exploration.   Her family history includes ADD / ADHD in her sister; Heart disease in her father; Hypertension in her father and mother; Other in her father.She reports that she has never smoked. She has never used smokeless tobacco. She reports that she drinks about 0.6 oz of alcohol per week. She reports that she does not use illicit drugs.  Current Outpatient Prescriptions on File Prior to Visit  Medication Sig Dispense Refill  . ALPRAZolam (XANAX) 0.5 MG tablet Take 1 tablet (0.5 mg total) by mouth 3 (three) times daily as needed. for anxiety 30 tablet 0  . cetirizine (ZYRTEC) 10 MG tablet Take 10 mg by mouth daily.    . fluticasone (FLONASE) 50 MCG/ACT nasal spray INSTILL 2 SPRAYS INTO EACH NOSTRIL DAILY 16 g 5  . gabapentin (NEURONTIN) 300 MG capsule Take 1 capsule by mouth 3 (three) times daily as needed.    . meloxicam (MOBIC) 15 MG  tablet TAKE 1/2 TO 1 TABLET BY MOUTH EVERY DAY 30 tablet 2  . MINASTRIN 24 FE 1-20 MG-MCG(24) CHEW Chew 1 tablet by mouth daily.    . rizatriptan (MAXALT-MLT) 10 MG disintegrating tablet Take 1 tablet (10 mg total) by mouth as needed for migraine. May repeat in 2 hours if needed 10 tablet 0  . sertraline (ZOLOFT) 50 MG tablet TAKE 3 TABLETS BY MOUTH EVERY DAY 90 tablet 5  . Sulfacetamide Sodium-Sulfur 10-5 % EMUL      No current facility-administered medications on file prior to visit.     Objective:  Objective Physical Exam  Constitutional: She is oriented to person, place, and time. She appears well-developed and well-nourished.  HENT:  Head: Normocephalic and atraumatic.  Eyes: Conjunctivae and EOM are normal.  Neck: Normal range of motion. Neck supple. No JVD present. Carotid bruit is not present. No thyromegaly present.  Cardiovascular: Normal rate, regular rhythm and normal heart sounds.   No murmur heard. Pulmonary/Chest: Effort normal and breath sounds normal. No respiratory distress. She has no wheezes. She has no rales. She exhibits no tenderness.  Musculoskeletal: She exhibits no edema.  Neurological: She is alert and oriented to person, place, and time.  Psychiatric: She has a normal mood and affect. Her behavior is normal. Judgment and thought content normal.  Nursing note and vitals reviewed.  BP 108/70 mmHg  Pulse 90  Temp(Src) 98.5 F (36.9 C) (Oral)  Ht 5\' 6"  (1.676 m)  Wt 184 lb 6.4 oz (83.643 kg)  BMI 29.78 kg/m2  SpO2 97% Wt  Readings from Last 3 Encounters:  07/26/15 184 lb 6.4 oz (83.643 kg)  03/16/15 185 lb 6.4 oz (84.097 kg)  10/10/14 187 lb (84.823 kg)     Lab Results  Component Value Date   WBC 5.6 09/23/2013   HGB 13.7 09/23/2013   HCT 41.0 09/23/2013   PLT 267.0 09/23/2013   GLUCOSE 161* 09/23/2013   ALT 19 09/23/2013   AST 16 09/23/2013   NA 137 09/23/2013   K 3.4* 09/23/2013   CL 104 09/23/2013   CREATININE 0.7 09/23/2013   BUN 8  09/23/2013   CO2 26 09/23/2013   TSH 0.99 09/23/2013    Dg Foot Complete Right  10/04/2014  CLINICAL DATA:  The patient tripped several days ago with pain and bruising about the right foot. Initial encounter. EXAM: RIGHT FOOT COMPLETE - 3+ VIEW COMPARISON:  None. FINDINGS: Imaged bones, joints and soft tissues appear normal. IMPRESSION: Negative exam. Electronically Signed   By: Drusilla Kanner M.D.   On: 10/04/2014 16:24     Assessment & Plan:  Plan I have discontinued Ms. Searson's cefUROXime, fluconazole, guaiFENesin-codeine, lisdexamfetamine, and DULoxetine. I have also changed her BELVIQ to Lorcaserin HCl. Additionally, I am having her start on DULoxetine, lisdexamfetamine, lisdexamfetamine, and lisdexamfetamine. Lastly, I am having her maintain her cetirizine, Sulfacetamide Sodium-Sulfur, fluticasone, meloxicam, MINASTRIN 24 FE, gabapentin, rizatriptan, sertraline, and ALPRAZolam.  Meds ordered this encounter  Medications  . DISCONTD: DULoxetine (CYMBALTA) 60 MG capsule    Sig: Take 60 mg by mouth daily.    Refill:  2  . DULoxetine (CYMBALTA) 60 MG capsule    Sig: Take 1 capsule (60 mg total) by mouth 2 (two) times daily.    Dispense:  60 capsule    Refill:  3  . lisdexamfetamine (VYVANSE) 50 MG capsule    Sig: Take 1 capsule (50 mg total) by mouth daily.    Dispense:  30 capsule    Refill:  0  . lisdexamfetamine (VYVANSE) 50 MG capsule    Sig: Take 1 capsule (50 mg total) by mouth daily.    Dispense:  30 capsule    Refill:  0    Do not fill until Aug 25, 2014  . lisdexamfetamine (VYVANSE) 50 MG capsule    Sig: Take 1 capsule (50 mg total) by mouth daily.    Dispense:  30 capsule    Refill:  0    Do not fill until Sep 25, 2014  . Lorcaserin HCl (BELVIQ) 10 MG TABS    Sig: Take 1 tablet by mouth 2 (two) times daily.    Dispense:  60 tablet    Refill:  3    Problem List Items Addressed This Visit      Unprioritized   Fibromyalgia - Primary    Inc cymbalta to 60 mg  bid  F/u rheum      Relevant Medications   DULoxetine (CYMBALTA) 60 MG capsule    Other Visit Diagnoses    Attention deficit hyperactivity disorder (ADHD), unspecified ADHD type        Relevant Medications    lisdexamfetamine (VYVANSE) 50 MG capsule    lisdexamfetamine (VYVANSE) 50 MG capsule    lisdexamfetamine (VYVANSE) 50 MG capsule    Obesity        Relevant Medications    lisdexamfetamine (VYVANSE) 50 MG capsule    lisdexamfetamine (VYVANSE) 50 MG capsule    lisdexamfetamine (VYVANSE) 50 MG capsule    Lorcaserin HCl (BELVIQ) 10 MG TABS  Follow-up: Return in about 6 months (around 01/24/2016), or if symptoms worsen or fail to improve, for adhd.  Loreen Freud, DO

## 2015-07-26 NOTE — Assessment & Plan Note (Signed)
Inc cymbalta to 60 mg bid  F/u rheum

## 2015-07-26 NOTE — Patient Instructions (Signed)
Attention Deficit Hyperactivity Disorder  Attention deficit hyperactivity disorder (ADHD) is a problem with behavior issues based on the way the brain functions (neurobehavioral disorder). It is a common reason for behavior and academic problems in school.  SYMPTOMS   There are 3 types of ADHD. The 3 types and some of the symptoms include:  · Inattentive.    Gets bored or distracted easily.    Loses or forgets things. Forgets to hand in homework.    Has trouble organizing or completing tasks.    Difficulty staying on task.    An inability to organize daily tasks and school work.    Leaving projects, chores, or homework unfinished.    Trouble paying attention or responding to details. Careless mistakes.    Difficulty following directions. Often seems like is not listening.    Dislikes activities that require sustained attention (like chores or homework).  · Hyperactive-impulsive.    Feels like it is impossible to sit still or stay in a seat. Fidgeting with hands and feet.    Trouble waiting turn.    Talking too much or out of turn. Interruptive.    Speaks or acts impulsively.    Aggressive, disruptive behavior.    Constantly busy or on the go; noisy.    Often leaves seat when they are expected to remain seated.    Often runs or climbs where it is not appropriate, or feels very restless.  · Combined.    Has symptoms of both of the above.  Often children with ADHD feel discouraged about themselves and with school. They often perform well below their abilities in school.  As children get older, the excess motor activities can calm down, but the problems with paying attention and staying organized persist. Most children do not outgrow ADHD but with good treatment can learn to cope with the symptoms.  DIAGNOSIS   When ADHD is suspected, the diagnosis should be made by professionals trained in ADHD. This professional will collect information about the individual suspected of having ADHD. Information must be collected from  various settings where the person lives, works, or attends school.    Diagnosis will include:  · Confirming symptoms began in childhood.  · Ruling out other reasons for the child's behavior.  · The health care providers will check with the child's school and check their medical records.  · They will talk to teachers and parents.  · Behavior rating scales for the child will be filled out by those dealing with the child on a daily basis.  A diagnosis is made only after all information has been considered.  TREATMENT   Treatment usually includes behavioral treatment, tutoring or extra support in school, and stimulant medicines. Because of the way a person's brain works with ADHD, these medicines decrease impulsivity and hyperactivity and increase attention. This is different than how they would work in a person who does not have ADHD. Other medicines used include antidepressants and certain blood pressure medicines.  Most experts agree that treatment for ADHD should address all aspects of the person's functioning. Along with medicines, treatment should include structured classroom management at school. Parents should reward good behavior, provide constant discipline, and set limits. Tutoring should be available for the child as needed.  ADHD is a lifelong condition. If untreated, the disorder can have long-term serious effects into adolescence and adulthood.  HOME CARE INSTRUCTIONS   · Often with ADHD there is a lot of frustration among family members dealing with the condition. Blame   and anger are also feelings that are common. In many cases, because the problem affects the family as a whole, the entire family may need help. A therapist can help the family find better ways to handle the disruptive behaviors of the person with ADHD and promote change. If the person with ADHD is young, most of the therapist's work is with the parents. Parents will learn techniques for coping with and improving their child's behavior.  Sometimes only the child with the ADHD needs counseling. Your health care providers can help you make these decisions.  · Children with ADHD may need help learning how to organize. Some helpful tips include:  ¨ Keep routines the same every day from wake-up time to bedtime. Schedule all activities, including homework and playtime. Keep the schedule in a place where the person with ADHD will often see it. Mark schedule changes as far in advance as possible.  ¨ Schedule outdoor and indoor recreation.  ¨ Have a place for everything and keep everything in its place. This includes clothing, backpacks, and school supplies.  ¨ Encourage writing down assignments and bringing home needed books. Work with your child's teachers for assistance in organizing school work.  · Offer your child a well-balanced diet. Breakfast that includes a balance of whole grains, protein, and fruits or vegetables is especially important for school performance. Children should avoid drinks with caffeine including:  ¨ Soft drinks.  ¨ Coffee.  ¨ Tea.  ¨ However, some older children (adolescents) may find these drinks helpful in improving their attention. Because it can also be common for adolescents with ADHD to become addicted to caffeine, talk with your health care provider about what is a safe amount of caffeine intake for your child.  · Children with ADHD need consistent rules that they can understand and follow. If rules are followed, give small rewards. Children with ADHD often receive, and expect, criticism. Look for good behavior and praise it. Set realistic goals. Give clear instructions. Look for activities that can foster success and self-esteem. Make time for pleasant activities with your child. Give lots of affection.  · Parents are their children's greatest advocates. Learn as much as possible about ADHD. This helps you become a stronger and better advocate for your child. It also helps you educate your child's teachers and instructors  if they feel inadequate in these areas. Parent support groups are often helpful. A national group with local chapters is called Children and Adults with Attention Deficit Hyperactivity Disorder (CHADD).  SEEK MEDICAL CARE IF:  · Your child has repeated muscle twitches, cough, or speech outbursts.  · Your child has sleep problems.  · Your child has a marked loss of appetite.  · Your child develops depression.  · Your child has new or worsening behavioral problems.  · Your child develops dizziness.  · Your child has a racing heart.  · Your child has stomach pains.  · Your child develops headaches.  SEEK IMMEDIATE MEDICAL CARE IF:  · Your child has been diagnosed with depression or anxiety and the symptoms seem to be getting worse.  · Your child has been depressed and suddenly appears to have increased energy or motivation.  · You are worried that your child is having a bad reaction to a medication he or she is taking for ADHD.     This information is not intended to replace advice given to you by your health care provider. Make sure you discuss any questions you have with your   health care provider.     Document Released: 07/11/2002 Document Revised: 07/26/2013 Document Reviewed: 03/28/2013  Elsevier Interactive Patient Education ©2016 [employer] Inc[employer]

## 2015-09-07 ENCOUNTER — Encounter: Payer: Self-pay | Admitting: Family Medicine

## 2015-09-07 ENCOUNTER — Ambulatory Visit (INDEPENDENT_AMBULATORY_CARE_PROVIDER_SITE_OTHER): Payer: BC Managed Care – PPO | Admitting: Family Medicine

## 2015-09-07 VITALS — BP 110/70 | HR 104 | Temp 98.8°F | Wt 184.6 lb

## 2015-09-07 DIAGNOSIS — J0140 Acute pansinusitis, unspecified: Secondary | ICD-10-CM | POA: Diagnosis not present

## 2015-09-07 MED ORDER — CEFUROXIME AXETIL 500 MG PO TABS: 500.0000 mg | ORAL_TABLET | Freq: Two times a day (BID) | ORAL | Status: AC

## 2015-09-07 MED ORDER — LEVOCETIRIZINE DIHYDROCHLORIDE 5 MG PO TABS: 5.0000 mg | ORAL_TABLET | Freq: Every evening | ORAL | Status: DC

## 2015-09-07 NOTE — Progress Notes (Signed)
  Subjective:     Jillian Green is a 45 y.o. female who presents for evaluation of sinus pain. Symptoms include: congestion, cough, facial pain, headaches, nasal congestion and sinus pressure. Onset of symptoms was 7 days ago. Symptoms have been gradually worsening since that time. Past history is significant for no history of pneumonia or bronchitis. Patient is a non-smoker.  Pt is using flonase and mucinex with no relief.    The following portions of the patient's history were reviewed and updated as appropriate: allergies, current medications, past family history, past medical history, past social history, past surgical history and problem list.  Review of Systems Pertinent items are noted in HPI.   Objective:    BP 110/70 mmHg  Pulse 104  Temp(Src) 98.8 F (37.1 C) (Oral)  Wt 184 lb 9.6 oz (83.734 kg)  SpO2 98% General appearance: alert, cooperative and no distress Ears: normal TM's and external ear canals both ears Nose: green discharge, moderate congestion, turbinates red, swollen, sinus tenderness bilateral Throat: lips, mucosa, and tongue normal; teeth and gums normal Neck: mild anterior cervical adenopathy, supple, symmetrical, trachea midline and thyroid not enlarged, symmetric, no tenderness/mass/nodules Lungs: clear to auscultation bilaterally Heart: S1, S2 normal    Assessment:    Acute bacterial sinusitis.    Plan:    Nasal saline sprays. Nasal steroids per medication orders. Antihistamines per medication orders. Ceftin per medication orders.

## 2015-09-07 NOTE — Patient Instructions (Signed)

## 2015-09-07 NOTE — Progress Notes (Signed)
Pre visit review using our clinic review tool, if applicable. No additional management support is needed unless otherwise documented below in the visit note. 

## 2015-09-17 ENCOUNTER — Encounter: Payer: Self-pay | Admitting: Family Medicine

## 2015-09-17 MED ORDER — CLARITHROMYCIN ER 500 MG PO TB24: 1000.0000 mg | ORAL_TABLET | Freq: Every day | ORAL | Status: DC

## 2015-09-17 MED ORDER — FLUCONAZOLE 150 MG PO TABS: 150.0000 mg | ORAL_TABLET | Freq: Once | ORAL | Status: DC

## 2015-09-17 NOTE — Telephone Encounter (Signed)
If no better, refilling same abx probably not going to work biaxin xl  2 po qd x 14 days  Ok to give diflucan 150 mg  #2

## 2015-09-17 NOTE — Telephone Encounter (Signed)
Per Dr.Lowne send Rx for 7 days.     KP

## 2015-10-05 ENCOUNTER — Other Ambulatory Visit: Payer: Self-pay | Admitting: Family Medicine

## 2015-10-05 ENCOUNTER — Encounter: Payer: Self-pay | Admitting: Family Medicine

## 2015-10-05 MED ORDER — MUPIROCIN 2 % EX OINT: 1.0000 "application " | TOPICAL_OINTMENT | Freq: Two times a day (BID) | CUTANEOUS | Status: DC

## 2015-10-23 ENCOUNTER — Encounter: Payer: Self-pay | Admitting: Family Medicine

## 2015-10-23 DIAGNOSIS — F909 Attention-deficit hyperactivity disorder, unspecified type: Secondary | ICD-10-CM

## 2015-10-24 MED ORDER — LISDEXAMFETAMINE DIMESYLATE 50 MG PO CAPS: 50.0000 mg | ORAL_CAPSULE | Freq: Every day | ORAL | Status: DC

## 2015-11-27 ENCOUNTER — Other Ambulatory Visit: Payer: Self-pay | Admitting: Family Medicine

## 2015-11-27 MED ORDER — ALPRAZOLAM 0.5 MG PO TABS: 0.5000 mg | ORAL_TABLET | Freq: Three times a day (TID) | ORAL | Status: DC | PRN

## 2015-11-27 NOTE — Telephone Encounter (Signed)
Last seen 09/07/15 and filled 07/26/15 #60 with 3 rf   Please advise    KP

## 2015-11-27 NOTE — Telephone Encounter (Signed)
Last seen 09/07/15 and filled 06/21/15 #30   Please advise    KP

## 2015-12-30 ENCOUNTER — Other Ambulatory Visit: Payer: Self-pay | Admitting: Family Medicine

## 2016-01-01 NOTE — Telephone Encounter (Signed)
Rx printed, awaiting DO signature.  

## 2016-01-01 NOTE — Telephone Encounter (Signed)
Pt is requesting refill on Alprazolam.  Last OV: 09/07/2015 Last Fill: 11/27/2015 #30 and 0RF Pt sig: 1 tablet TID PRN UDS: None  Please advise.

## 2016-01-01 NOTE — Telephone Encounter (Signed)
Rx faxed to [facility] pharmacy.  

## 2016-01-01 NOTE — Telephone Encounter (Signed)
Refill x1   1 refill 

## 2016-01-28 ENCOUNTER — Other Ambulatory Visit: Payer: Self-pay | Admitting: Family Medicine

## 2016-01-28 NOTE — Telephone Encounter (Signed)
Requesting Belviq 10mg -Take 1 tablet by mouth twice a day. Last refill:11/27/15;#60,1 Last OV:09/07/15-acute visit Please advise.//AB/CMA

## 2016-02-01 ENCOUNTER — Ambulatory Visit (INDEPENDENT_AMBULATORY_CARE_PROVIDER_SITE_OTHER): Payer: BC Managed Care – PPO | Admitting: Family Medicine

## 2016-02-01 ENCOUNTER — Encounter: Payer: Self-pay | Admitting: Family Medicine

## 2016-02-01 VITALS — BP 120/88 | HR 108 | Temp 98.2°F | Ht 66.0 in | Wt 181.6 lb

## 2016-02-01 DIAGNOSIS — E663 Overweight: Secondary | ICD-10-CM

## 2016-02-01 DIAGNOSIS — F909 Attention-deficit hyperactivity disorder, unspecified type: Secondary | ICD-10-CM

## 2016-02-01 DIAGNOSIS — J069 Acute upper respiratory infection, unspecified: Secondary | ICD-10-CM | POA: Diagnosis not present

## 2016-02-01 MED ORDER — LISDEXAMFETAMINE DIMESYLATE 50 MG PO CAPS: 50.0000 mg | ORAL_CAPSULE | Freq: Every day | ORAL | Status: DC

## 2016-02-01 MED ORDER — FLUTICASONE PROPIONATE 50 MCG/ACT NA SUSP: NASAL | Status: DC

## 2016-02-01 MED ORDER — LIRAGLUTIDE -WEIGHT MANAGEMENT 18 MG/3ML ~~LOC~~ SOPN: 3.0000 mg | PEN_INJECTOR | Freq: Every day | SUBCUTANEOUS | Status: DC

## 2016-02-01 NOTE — Progress Notes (Signed)
Patient ID: Jillian Green, female    DOB: May 14, 1971  Age: 45 y.o. MRN: [id number]    Subjective:  Subjective HPI Sagrario L Stachnik presents for f/u add and weight management.  She has been disappointed with the belviq and has not been able to lose weight.     Review of Systems  Constitutional: Negative for diaphoresis, appetite change, fatigue and unexpected weight change.  Eyes: Negative for pain, redness and visual disturbance.  Respiratory: Negative for cough, chest tightness, shortness of breath and wheezing.   Cardiovascular: Negative for chest pain, palpitations and leg swelling.  Endocrine: Negative for cold intolerance, heat intolerance, polydipsia, polyphagia and polyuria.  Genitourinary: Negative for dysuria, frequency and difficulty urinating.  Neurological: Negative for dizziness, light-headedness, numbness and headaches.    History Past Medical History  Diagnosis Date  . Depression   . ADD (attention deficit disorder)   . Migraine   . Unspecified eustachian tube disorder     She has past surgical history that includes Sinus exploration.   Her family history includes ADD / ADHD in her sister; Heart disease in her father; Hypertension in her father and mother; Other in her father.She reports that she has never smoked. She has never used smokeless tobacco. She reports that she drinks about 0.6 oz of alcohol per week. She reports that she does not use illicit drugs.  Current Outpatient Prescriptions on File Prior to Visit  Medication Sig Dispense Refill  . ALPRAZolam (XANAX) 0.5 MG tablet Take 1 tablet (0.5 mg total) by mouth 3 (three) times daily as needed for anxiety. 30 tablet 1  . DULoxetine (CYMBALTA) 60 MG capsule Take 1 capsule (60 mg total) by mouth 2 (two) times daily. 60 capsule 3  . gabapentin (NEURONTIN) 300 MG capsule Take 1 capsule by mouth 3 (three) times daily as needed.    [name]. levocetirizine (XYZAL) 5 MG tablet Take 1 tablet (5 mg total) by mouth  every evening. 30 tablet 5  . meloxicam (MOBIC) 15 MG tablet TAKE 1/2 TO 1 TABLET BY MOUTH EVERY DAY 30 tablet 2  . MINASTRIN 24 FE 1-20 MG-MCG(24) CHEW Chew 1 tablet by mouth daily.    . mupirocin ointment (BACTROBAN) 2 % Place 1 application into the nose 2 (two) times daily. 22 g 0  . rizatriptan (MAXALT-MLT) 10 MG disintegrating tablet Take 1 tablet (10 mg total) by mouth as needed for migraine. May repeat in 2 hours if needed 10 tablet 0  . Sulfacetamide Sodium-Sulfur 10-5 % EMUL      No current facility-administered medications on file prior to visit.     Objective:  Objective Physical Exam  Constitutional: She is oriented to person, place, and time. She appears well-developed and well-nourished.  HENT:  Head: Normocephalic and atraumatic.  Eyes: Conjunctivae and EOM are normal.  Neck: Normal range of motion. Neck supple. No JVD present. Carotid bruit is not present. No thyromegaly present.  Cardiovascular: Normal rate, regular rhythm and normal heart sounds.   No murmur heard. Pulmonary/Chest: Effort normal and breath sounds normal. No respiratory distress. She has no wheezes. She has no rales. She exhibits no tenderness.  Musculoskeletal: She exhibits no edema.  Neurological: She is alert and oriented to person, place, and time.  Psychiatric: She has a normal mood and affect. Her behavior is normal. Judgment and thought content normal.  Nursing note and vitals reviewed.  BP 120/88 mmHg  Pulse 108  Temp(Src) 98.2 F (36.8 C) (Oral)  Ht 5\' 6"  (1.676 m)  Wt 181 lb 9.6 oz (82.373 kg)  BMI 29.32 kg/m2  SpO2 98% Wt Readings from Last 3 Encounters:  02/01/16 181 lb 9.6 oz (82.373 kg)  09/07/15 184 lb 9.6 oz (83.734 kg)  07/26/15 184 lb 6.4 oz (83.643 kg)     Lab Results  Component Value Date   WBC 5.6 09/23/2013   HGB 13.7 09/23/2013   HCT 41.0 09/23/2013   PLT 267.0 09/23/2013   GLUCOSE 161* 09/23/2013   ALT 19 09/23/2013   AST 16 09/23/2013   NA 137 09/23/2013   K  3.4* 09/23/2013   CL 104 09/23/2013   CREATININE 0.7 09/23/2013   BUN 8 09/23/2013   CO2 26 09/23/2013   TSH 0.99 09/23/2013    Dg Foot Complete Right  10/04/2014  CLINICAL DATA:  The patient tripped several days ago with pain and bruising about the right foot. Initial encounter. EXAM: RIGHT FOOT COMPLETE - 3+ VIEW COMPARISON:  None. FINDINGS: Imaged bones, joints and soft tissues appear normal. IMPRESSION: Negative exam. Electronically Signed   By: Drusilla Kannerhomas  Dalessio M.D.   On: 10/04/2014 16:24     Assessment & Plan:  Plan I have discontinued Ms. Benedicto's clarithromycin, fluconazole, and BELVIQ. I am also having her start on Liraglutide -Weight Management. Additionally, I am having her maintain her Sulfacetamide Sodium-Sulfur, meloxicam, MINASTRIN 24 FE, gabapentin, rizatriptan, DULoxetine, levocetirizine, mupirocin ointment, ALPRAZolam, lisdexamfetamine, lisdexamfetamine, lisdexamfetamine, and fluticasone.  Meds ordered this encounter  Medications  . lisdexamfetamine (VYVANSE) 50 MG capsule    Sig: Take 1 capsule (50 mg total) by mouth daily.    Dispense:  30 capsule    Refill:  0    Do not fill until August 2017  . lisdexamfetamine (VYVANSE) 50 MG capsule    Sig: Take 1 capsule (50 mg total) by mouth daily.    Dispense:  30 capsule    Refill:  0    Do not fill until July 2017  . lisdexamfetamine (VYVANSE) 50 MG capsule    Sig: Take 1 capsule (50 mg total) by mouth daily.    Dispense:  30 capsule    Refill:  0  . fluticasone (FLONASE) 50 MCG/ACT nasal spray    Sig: INSTILL 2 SPRAYS INTO EACH NOSTRIL DAILY    Dispense:  16 g    Refill:  5  . Liraglutide -Weight Management (SAXENDA) 18 MG/3ML SOPN    Sig: Inject 3 mg into the skin daily.    Dispense:  3 mL    Refill:  3    Problem List Items Addressed This Visit      Unprioritized   Overweight    Will try saxenda-- pt will check with ins con't diet and exercise rto 1 month       Other Visit Diagnoses    Attention  deficit hyperactivity disorder (ADHD), unspecified ADHD type    -  Primary    Relevant Medications    lisdexamfetamine (VYVANSE) 50 MG capsule    lisdexamfetamine (VYVANSE) 50 MG capsule    lisdexamfetamine (VYVANSE) 50 MG capsule    Acute upper respiratory infection        Relevant Medications    fluticasone (FLONASE) 50 MCG/ACT nasal spray    Morbid obesity due to excess calories (HCC)        Relevant Medications    lisdexamfetamine (VYVANSE) 50 MG capsule    lisdexamfetamine (VYVANSE) 50 MG capsule    lisdexamfetamine (VYVANSE) 50 MG capsule    Liraglutide -Weight Management (SAXENDA) 18  MG/3ML SOPN       Follow-up: Return in about 3 months (around 05/03/2016), or if symptoms worsen or fail to improve, for f/u weight.  Donato Schultz, DO

## 2016-02-01 NOTE — Progress Notes (Signed)
Pre visit review using our clinic review tool, if applicable. No additional management support is needed unless otherwise documented below in the visit note. 

## 2016-02-01 NOTE — Patient Instructions (Signed)
Obesity Obesity is defined as having too much total body fat and a body mass index (BMI) of 30 or more. BMI is an estimate of body fat and is calculated from your height and weight. BMI is typically calculated by your health care provider during regular wellness visits. Obesity happens when you consume more calories than you can burn by exercising or performing daily physical tasks. Prolonged obesity can cause major illnesses or emergencies, such as:  Stroke.  Heart disease.  Diabetes.  Cancer.  Arthritis.  High blood pressure (hypertension).  High cholesterol.  Sleep apnea.  Erectile dysfunction.  Infertility problems. CAUSES   Regularly eating unhealthy foods.  Physical inactivity.  Certain disorders, such as an underactive thyroid (hypothyroidism), Cushing's syndrome, and polycystic ovarian syndrome.  Certain medicines, such as steroids, some depression medicines, and antipsychotics.  Genetics.  Lack of sleep. DIAGNOSIS A health care provider can diagnose obesity after calculating your BMI. Obesity will be diagnosed if your BMI is 30 or higher. There are other methods of measuring obesity levels. Some other methods include measuring your skinfold thickness, your waist circumference, and comparing your hip circumference to your waist circumference. TREATMENT  A healthy treatment program includes some or all of the following:  Long-term dietary changes.  Exercise and physical activity.  Behavioral and lifestyle changes.  Medicine only under the supervision of your health care provider. Medicines may help, but only if they are used with diet and exercise programs. If your BMI is 40 or higher, your health care provider may recommend specialized surgery or programs to help with weight loss. An unhealthy treatment program includes:  Fasting.  Fad diets.  Supplements and drugs. These choices do not succeed in long-term weight control. HOME CARE  INSTRUCTIONS  Exercise and perform physical activity as directed by your health care provider. To increase physical activity, try the following:  Use stairs instead of elevators.  Park farther away from store entrances.  Garden, bike, or walk instead of watching television or using the computer.  Eat healthy, low-calorie foods and drinks on a regular basis. Eat more fruits and vegetables. Use low-calorie cookbooks or take healthy cooking classes.  Limit fast food, sweets, and processed snack foods.  Eat smaller portions.  Keep a daily journal of everything you eat. There are many free websites to help you with this. It may be helpful to measure your foods so you can determine if you are eating the correct portion sizes.  Avoid drinking alcohol. Drink more water and drinks without calories.  Take vitamins and supplements only as recommended by your health care provider.  Weight-loss support groups, registered dietitians, counselors, and stress reduction education can also be very helpful. SEEK IMMEDIATE MEDICAL CARE IF:  You have chest pain or tightness.  You have trouble breathing or feel short of breath.  You have weakness or leg numbness.  You feel confused or have trouble talking.  You have sudden changes in your vision.   This information is not intended to replace advice given to you by your health care provider. Make sure you discuss any questions you have with your health care provider.   Document Released: 08/28/2004 Document Revised: 08/11/2014 Document Reviewed: 08/27/2011 Elsevier Interactive Patient Education 2016 Elsevier Inc.  

## 2016-02-03 NOTE — Assessment & Plan Note (Signed)
Will try saxenda-- pt will check with ins con't diet and exercise rto 1 month

## 2016-02-04 ENCOUNTER — Telehealth: Payer: Self-pay | Admitting: *Deleted

## 2016-02-04 ENCOUNTER — Encounter: Payer: Self-pay | Admitting: Family Medicine

## 2016-02-04 NOTE — Telephone Encounter (Signed)
PA initiated on [email or web address], awaiting determination. JG//CMA  Key: [id number]

## 2016-02-08 ENCOUNTER — Telehealth: Payer: Self-pay | Admitting: Family Medicine

## 2016-02-08 NOTE — Telephone Encounter (Signed)
error:[id number] ° °

## 2016-02-12 ENCOUNTER — Ambulatory Visit: Payer: Self-pay | Admitting: Family Medicine

## 2016-02-13 ENCOUNTER — Encounter: Payer: Self-pay | Admitting: Family Medicine

## 2016-02-14 ENCOUNTER — Ambulatory Visit (INDEPENDENT_AMBULATORY_CARE_PROVIDER_SITE_OTHER): Payer: BC Managed Care – PPO | Admitting: Family Medicine

## 2016-02-14 ENCOUNTER — Encounter: Payer: Self-pay | Admitting: Family Medicine

## 2016-02-14 MED ORDER — INSULIN PEN NEEDLE 32G X 6 MM MISC: Status: DC

## 2016-02-14 NOTE — Progress Notes (Signed)
Pre visit review using our clinic review tool, if applicable. No additional management support is needed unless otherwise documented below in the visit note. 

## 2016-02-14 NOTE — Patient Instructions (Signed)

## 2016-02-14 NOTE — Progress Notes (Signed)
Patient ID: Jillian Green, female    DOB: 10/07/1970  Age: 45 y.o. MRN: [id number]    Subjective:  Subjective HPI Jillian Green presents for weight loss--- saxenda.  She has researched it.    Review of Systems  Constitutional: Negative for diaphoresis, appetite change, fatigue and unexpected weight change.  Eyes: Negative for pain, redness and visual disturbance.  Respiratory: Negative for cough, chest tightness, shortness of breath and wheezing.   Cardiovascular: Negative for chest pain, palpitations and leg swelling.  Endocrine: Negative for cold intolerance, heat intolerance, polydipsia, polyphagia and polyuria.  Genitourinary: Negative for dysuria, frequency and difficulty urinating.  Neurological: Negative for dizziness, light-headedness, numbness and headaches.    History Past Medical History  Diagnosis Date  . Depression   . ADD (attention deficit disorder)   . Migraine   . Unspecified eustachian tube disorder     She has past surgical history that includes Sinus exploration.   Her family history includes ADD / ADHD in her sister; Heart disease in her father; Hypertension in her father and mother; Other in her father.She reports that she has never smoked. She has never used smokeless tobacco. She reports that she drinks about 0.6 oz of alcohol per week. She reports that she does not use illicit drugs.  Current Outpatient Prescriptions on File Prior to Visit  Medication Sig Dispense Refill  . ALPRAZolam (XANAX) 0.5 MG tablet Take 1 tablet (0.5 mg total) by mouth 3 (three) times daily as needed for anxiety. 30 tablet 1  . DULoxetine (CYMBALTA) 60 MG capsule Take 1 capsule (60 mg total) by mouth 2 (two) times daily. 60 capsule 3  . fluticasone (FLONASE) 50 MCG/ACT nasal spray INSTILL 2 SPRAYS INTO EACH NOSTRIL DAILY 16 g 5  . gabapentin (NEURONTIN) 300 MG capsule Take 1 capsule by mouth 3 (three) times daily as needed.    [name] levocetirizine (XYZAL) 5 MG tablet Take 1  tablet (5 mg total) by mouth every evening. 30 tablet 5  . Liraglutide -Weight Management (SAXENDA) 18 MG/3ML SOPN Inject 3 mg into the skin daily. 3 mL 3  . lisdexamfetamine (VYVANSE) 50 MG capsule Take 1 capsule (50 mg total) by mouth daily. 30 capsule 0  . lisdexamfetamine (VYVANSE) 50 MG capsule Take 1 capsule (50 mg total) by mouth daily. 30 capsule 0  . lisdexamfetamine (VYVANSE) 50 MG capsule Take 1 capsule (50 mg total) by mouth daily. 30 capsule 0  . meloxicam (MOBIC) 15 MG tablet TAKE 1/2 TO 1 TABLET BY MOUTH EVERY DAY 30 tablet 2  . MINASTRIN 24 FE 1-20 MG-MCG(24) CHEW Chew 1 tablet by mouth daily.    . mupirocin ointment (BACTROBAN) 2 % Place 1 application into the nose 2 (two) times daily. 22 g 0  . rizatriptan (MAXALT-MLT) 10 MG disintegrating tablet Take 1 tablet (10 mg total) by mouth as needed for migraine. May repeat in 2 hours if needed 10 tablet 0  . Sulfacetamide Sodium-Sulfur 10-5 % EMUL      No current facility-administered medications on file prior to visit.     Objective:  Objective Physical Exam  Constitutional: She is oriented to person, place, and time. She appears well-developed and well-nourished.  HENT:  Head: Normocephalic and atraumatic.  Eyes: Conjunctivae and EOM are normal.  Neck: Normal range of motion. Neck supple. No JVD present. Carotid bruit is not present. No thyromegaly present.  Cardiovascular: Normal rate, regular rhythm and normal heart sounds.   No murmur heard. Pulmonary/Chest: Effort normal and  breath sounds normal. No respiratory distress. She has no wheezes. She has no rales. She exhibits no tenderness.  Musculoskeletal: She exhibits no edema.  Neurological: She is alert and oriented to person, place, and time.  Psychiatric: She has a normal mood and affect.  Nursing note and vitals reviewed.  BP 130/86 mmHg  Pulse 109  Temp(Src) 98.1 F (36.7 C) (Oral)  Ht 5\' 6"  (1.676 m)  Wt 183 lb (83.008 kg)  BMI 29.55 kg/m2  SpO2 98% Wt  Readings from Last 3 Encounters:  02/14/16 183 lb (83.008 kg)  02/01/16 181 lb 9.6 oz (82.373 kg)  09/07/15 184 lb 9.6 oz (83.734 kg)     Lab Results  Component Value Date   WBC 5.6 09/23/2013   HGB 13.7 09/23/2013   HCT 41.0 09/23/2013   PLT 267.0 09/23/2013   GLUCOSE 161* 09/23/2013   ALT 19 09/23/2013   AST 16 09/23/2013   NA 137 09/23/2013   K 3.4* 09/23/2013   CL 104 09/23/2013   CREATININE 0.7 09/23/2013   BUN 8 09/23/2013   CO2 26 09/23/2013   TSH 0.99 09/23/2013    Dg Foot Complete Right  10/04/2014  CLINICAL DATA:  The patient tripped several days ago with pain and bruising about the right foot. Initial encounter. EXAM: RIGHT FOOT COMPLETE - 3+ VIEW COMPARISON:  None. FINDINGS: Imaged bones, joints and soft tissues appear normal. IMPRESSION: Negative exam. Electronically Signed   By: Drusilla Kannerhomas  Dalessio M.D.   On: 10/04/2014 16:24     Assessment & Plan:  Plan I am having Jillian Green start on Insulin Pen Needle. I am also having her maintain her Sulfacetamide Sodium-Sulfur, meloxicam, MINASTRIN 24 FE, gabapentin, rizatriptan, DULoxetine, levocetirizine, mupirocin ointment, ALPRAZolam, lisdexamfetamine, lisdexamfetamine, lisdexamfetamine, fluticasone, and Liraglutide -Weight Management.  Meds ordered this encounter  Medications  . Insulin Pen Needle 32G X 6 MM MISC    Sig: As directed    Dispense:  100 each    Refill:  11    Problem List Items Addressed This Visit    None    Visit Diagnoses    Morbid obesity due to excess calories (HCC)    -  Primary    Relevant Medications    Insulin Pen Needle 32G X 6 MM MISC     d/w pt diet and exercise, pt instructed on saxenda--- start with 0.6 mg daily x 1 week and inc by 0.6 mg each week to a max of 3 mg--- rto 1 month Follow-up: Return in about 4 weeks (around 03/13/2016), or if symptoms worsen or fail to improve, for weight check.  Donato SchultzYvonne R Lowne Chase, DO

## 2016-02-15 NOTE — Telephone Encounter (Signed)
Confirmation # for the call was [phone].    KP

## 2016-02-15 NOTE — Telephone Encounter (Signed)
PA approved effective 02/14/2016 through 06/16/2016 Approval letter sent for scanning

## 2016-02-18 ENCOUNTER — Other Ambulatory Visit: Payer: Self-pay | Admitting: Family Medicine

## 2016-02-18 NOTE — Telephone Encounter (Signed)
Rx sent to the pharmacy by e-script.//AB/CMA 

## 2016-04-01 ENCOUNTER — Ambulatory Visit (INDEPENDENT_AMBULATORY_CARE_PROVIDER_SITE_OTHER): Payer: BC Managed Care – PPO | Admitting: Family Medicine

## 2016-04-01 ENCOUNTER — Encounter: Payer: Self-pay | Admitting: Family Medicine

## 2016-04-01 VITALS — BP 122/80 | HR 104 | Temp 98.4°F | Ht 66.0 in | Wt 173.8 lb

## 2016-04-01 DIAGNOSIS — F909 Attention-deficit hyperactivity disorder, unspecified type: Secondary | ICD-10-CM

## 2016-04-01 DIAGNOSIS — Z111 Encounter for screening for respiratory tuberculosis: Secondary | ICD-10-CM | POA: Diagnosis not present

## 2016-04-01 MED ORDER — LISDEXAMFETAMINE DIMESYLATE 50 MG PO CAPS: 50.0000 mg | ORAL_CAPSULE | Freq: Every day | ORAL | 0 refills | Status: DC

## 2016-04-01 MED ORDER — LIRAGLUTIDE -WEIGHT MANAGEMENT 18 MG/3ML ~~LOC~~ SOPN: 3.0000 mg | PEN_INJECTOR | Freq: Every day | SUBCUTANEOUS | 3 refills | Status: DC

## 2016-04-01 NOTE — Addendum Note (Signed)
Addended by: Seabron SpatesLOWNE CHASE, YVONNE R on: 04/01/2016 06:47 PM   Modules accepted: Orders

## 2016-04-01 NOTE — Patient Instructions (Signed)
Attention Deficit Hyperactivity Disorder  Attention deficit hyperactivity disorder (ADHD) is a problem with behavior issues based on the way the brain functions (neurobehavioral disorder). It is a common reason for behavior and academic problems in school.  SYMPTOMS   There are 3 types of ADHD. The 3 types and some of the symptoms include:  · Inattentive.    Gets bored or distracted easily.    Loses or forgets things. Forgets to hand in homework.    Has trouble organizing or completing tasks.    Difficulty staying on task.    An inability to organize daily tasks and school work.    Leaving projects, chores, or homework unfinished.    Trouble paying attention or responding to details. Careless mistakes.    Difficulty following directions. Often seems like is not listening.    Dislikes activities that require sustained attention (like chores or homework).  · Hyperactive-impulsive.    Feels like it is impossible to sit still or stay in a seat. Fidgeting with hands and feet.    Trouble waiting turn.    Talking too much or out of turn. Interruptive.    Speaks or acts impulsively.    Aggressive, disruptive behavior.    Constantly busy or on the go; noisy.    Often leaves seat when they are expected to remain seated.    Often runs or climbs where it is not appropriate, or feels very restless.  · Combined.    Has symptoms of both of the above.  Often children with ADHD feel discouraged about themselves and with school. They often perform well below their abilities in school.  As children get older, the excess motor activities can calm down, but the problems with paying attention and staying organized persist. Most children do not outgrow ADHD but with good treatment can learn to cope with the symptoms.  DIAGNOSIS   When ADHD is suspected, the diagnosis should be made by professionals trained in ADHD. This professional will collect information about the individual suspected of having ADHD. Information must be collected from  various settings where the person lives, works, or attends school.    Diagnosis will include:  · Confirming symptoms began in childhood.  · Ruling out other reasons for the child's behavior.  · The health care providers will check with the child's school and check their medical records.  · They will talk to teachers and parents.  · Behavior rating scales for the child will be filled out by those dealing with the child on a daily basis.  A diagnosis is made only after all information has been considered.  TREATMENT   Treatment usually includes behavioral treatment, tutoring or extra support in school, and stimulant medicines. Because of the way a person's brain works with ADHD, these medicines decrease impulsivity and hyperactivity and increase attention. This is different than how they would work in a person who does not have ADHD. Other medicines used include antidepressants and certain blood pressure medicines.  Most experts agree that treatment for ADHD should address all aspects of the person's functioning. Along with medicines, treatment should include structured classroom management at school. Parents should reward good behavior, provide constant discipline, and set limits. Tutoring should be available for the child as needed.  ADHD is a lifelong condition. If untreated, the disorder can have long-term serious effects into adolescence and adulthood.  HOME CARE INSTRUCTIONS   · Often with ADHD there is a lot of frustration among family members dealing with the condition. Blame   and anger are also feelings that are common. In many cases, because the problem affects the family as a whole, the entire family may need help. A therapist can help the family find better ways to handle the disruptive behaviors of the person with ADHD and promote change. If the person with ADHD is young, most of the therapist's work is with the parents. Parents will learn techniques for coping with and improving their child's behavior.  Sometimes only the child with the ADHD needs counseling. Your health care providers can help you make these decisions.  · Children with ADHD may need help learning how to organize. Some helpful tips include:  ¨ Keep routines the same every day from wake-up time to bedtime. Schedule all activities, including homework and playtime. Keep the schedule in a place where the person with ADHD will often see it. Mark schedule changes as far in advance as possible.  ¨ Schedule outdoor and indoor recreation.  ¨ Have a place for everything and keep everything in its place. This includes clothing, backpacks, and school supplies.  ¨ Encourage writing down assignments and bringing home needed books. Work with your child's teachers for assistance in organizing school work.  · Offer your child a well-balanced diet. Breakfast that includes a balance of whole grains, protein, and fruits or vegetables is especially important for school performance. Children should avoid drinks with caffeine including:  ¨ Soft drinks.  ¨ Coffee.  ¨ Tea.  ¨ However, some older children (adolescents) may find these drinks helpful in improving their attention. Because it can also be common for adolescents with ADHD to become addicted to caffeine, talk with your health care provider about what is a safe amount of caffeine intake for your child.  · Children with ADHD need consistent rules that they can understand and follow. If rules are followed, give small rewards. Children with ADHD often receive, and expect, criticism. Look for good behavior and praise it. Set realistic goals. Give clear instructions. Look for activities that can foster success and self-esteem. Make time for pleasant activities with your child. Give lots of affection.  · Parents are their children's greatest advocates. Learn as much as possible about ADHD. This helps you become a stronger and better advocate for your child. It also helps you educate your child's teachers and instructors  if they feel inadequate in these areas. Parent support groups are often helpful. A national group with local chapters is called Children and Adults with Attention Deficit Hyperactivity Disorder (CHADD).  SEEK MEDICAL CARE IF:  · Your child has repeated muscle twitches, cough, or speech outbursts.  · Your child has sleep problems.  · Your child has a marked loss of appetite.  · Your child develops depression.  · Your child has new or worsening behavioral problems.  · Your child develops dizziness.  · Your child has a racing heart.  · Your child has stomach pains.  · Your child develops headaches.  SEEK IMMEDIATE MEDICAL CARE IF:  · Your child has been diagnosed with depression or anxiety and the symptoms seem to be getting worse.  · Your child has been depressed and suddenly appears to have increased energy or motivation.  · You are worried that your child is having a bad reaction to a medication he or she is taking for ADHD.     This information is not intended to replace advice given to you by your health care provider. Make sure you discuss any questions you have with your   health care provider.     Document Released: 07/11/2002 Document Revised: 07/26/2013 Document Reviewed: 03/28/2013  Elsevier Interactive Patient Education ©2016 [employer] Inc[employer]

## 2016-04-01 NOTE — Addendum Note (Signed)
Addended by: Arnette NorrisPAYNE, Larico Dimock P on: 04/01/2016 06:55 PM   Modules accepted: Orders

## 2016-04-01 NOTE — Progress Notes (Signed)
Pre visit review using our clinic review tool, if applicable. No additional management support is needed unless otherwise documented below in the visit note. 

## 2016-04-01 NOTE — Progress Notes (Addendum)
Patient ID: Jillian Green, female    DOB: 05-22-1971  Age: 45 y.o. MRN: [id number]    Subjective:  Subjective  HPI Jillian Green presents for f/u add-- doing well with meds-- no complaints.   She also needs her form filled out for school .  [place] [employer]    Pt also f/u with saxenda--- lost 10 lbs since last ov.    Review of Systems  Constitutional: Negative for activity change, appetite change, fatigue and unexpected weight change.  Respiratory: Negative for cough and shortness of breath.   Cardiovascular: Negative for chest pain and palpitations.  Psychiatric/Behavioral: Negative for behavioral problems and dysphoric mood. The patient is not nervous/anxious.     History Past Medical History:  Diagnosis Date  . ADD (attention deficit disorder)   . Depression   . Migraine   . Unspecified eustachian tube disorder     She has a past surgical history that includes Sinus exploration.   Her family history includes ADD / ADHD in her sister; Heart disease in her father; Hypertension in her father and mother; Other in her father.She reports that she has never smoked. She has never used smokeless tobacco. She reports that she drinks about 0.6 oz of alcohol per week . She reports that she does not use drugs.  Current Outpatient Prescriptions on File Prior to Visit  Medication Sig Dispense Refill  . ALPRAZolam (XANAX) 0.5 MG tablet Take 1 tablet (0.5 mg total) by mouth 3 (three) times daily as needed for anxiety. 30 tablet 1  . DULoxetine (CYMBALTA) 60 MG capsule Take 1 capsule (60 mg total) by mouth 2 (two) times daily. 60 capsule 3  . fluticasone (FLONASE) 50 MCG/ACT nasal spray INSTILL 2 SPRAYS INTO EACH NOSTRIL DAILY 16 g 5  . gabapentin (NEURONTIN) 300 MG capsule Take 1 capsule by mouth 3 (three) times daily as needed.    . Insulin Pen Needle 32G X 6 MM MISC As directed 100 each 11  . levocetirizine (XYZAL) 5 MG tablet TAKE 1 TABLET BY MOUTH  EVERY EVENING 30 tablet 5  . meloxicam (MOBIC) 15 MG tablet TAKE 1/2 TO 1 TABLET BY MOUTH EVERY DAY 30 tablet 2  . MINASTRIN 24 FE 1-20 MG-MCG(24) CHEW Chew 1 tablet by mouth daily.    . mupirocin ointment (BACTROBAN) 2 % Place 1 application into the nose 2 (two) times daily. 22 g 0  . rizatriptan (MAXALT-MLT) 10 MG disintegrating tablet Take 1 tablet (10 mg total) by mouth as needed for migraine. May repeat in 2 hours if needed 10 tablet 0  . Sulfacetamide Sodium-Sulfur 10-5 % EMUL      No current facility-administered medications on file prior to visit.      Objective:  Objective  Physical Exam  Constitutional: She is oriented to person, place, and time. She appears well-developed and well-nourished.  HENT:  Head: Normocephalic and atraumatic.  Eyes: Conjunctivae and EOM are normal.  Neck: Normal range of motion. Neck supple. No JVD present. Carotid bruit is not present. No thyromegaly present.  Cardiovascular: Normal rate, regular rhythm and normal heart sounds.   No murmur heard. Pulmonary/Chest: Effort normal and breath sounds normal. No respiratory distress. She has no wheezes. She has no rales. She exhibits no tenderness.  Musculoskeletal: She exhibits no edema.  Neurological: She is alert and oriented to person, place, and time.  Psychiatric: She has a normal mood and affect. Her behavior is normal. Judgment and thought content  normal.  Nursing note and vitals reviewed.  BP 122/80 (BP Location: Left Arm, Patient Position: Sitting, Cuff Size: Normal)   Pulse (!) 104   Temp 98.4 F (36.9 C) (Oral)   Ht 5\' 6"  (1.676 m)   Wt 173 lb 12.8 oz (78.8 kg)   SpO2 98%   BMI 28.05 kg/m  Wt Readings from Last 3 Encounters:  04/01/16 173 lb 12.8 oz (78.8 kg)  02/14/16 183 lb (83 kg)  02/01/16 181 lb 9.6 oz (82.4 kg)     Lab Results  Component Value Date   WBC 5.6 09/23/2013   HGB 13.7 09/23/2013   HCT 41.0 09/23/2013   PLT 267.0 09/23/2013   GLUCOSE 161 (H) 09/23/2013   ALT  19 09/23/2013   AST 16 09/23/2013   NA 137 09/23/2013   K 3.4 (L) 09/23/2013   CL 104 09/23/2013   CREATININE 0.7 09/23/2013   BUN 8 09/23/2013   CO2 26 09/23/2013   TSH 0.99 09/23/2013    Dg Foot Complete Right  Result Date: 10/04/2014 CLINICAL DATA:  The patient tripped several days ago with pain and bruising about the right foot. Initial encounter. EXAM: RIGHT FOOT COMPLETE - 3+ VIEW COMPARISON:  None. FINDINGS: Imaged bones, joints and soft tissues appear normal. IMPRESSION: Negative exam. Electronically Signed   By: Drusilla Kanner M.D.   On: 10/04/2014 16:24     Assessment & Plan:  Plan  I am having Ms. Kahre maintain her Sulfacetamide Sodium-Sulfur, meloxicam, MINASTRIN 24 FE, gabapentin, rizatriptan, DULoxetine, mupirocin ointment, ALPRAZolam, fluticasone, Insulin Pen Needle, levocetirizine, lisdexamfetamine, lisdexamfetamine, lisdexamfetamine, and Liraglutide -Weight Management.  Meds ordered this encounter  Medications  . lisdexamfetamine (VYVANSE) 50 MG capsule    Sig: Take 1 capsule (50 mg total) by mouth daily.    Dispense:  30 capsule    Refill:  0    Do not fill until September 2017  . lisdexamfetamine (VYVANSE) 50 MG capsule    Sig: Take 1 capsule (50 mg total) by mouth daily.    Dispense:  30 capsule    Refill:  0    Do not fill until October 2017  . lisdexamfetamine (VYVANSE) 50 MG capsule    Sig: Take 1 capsule (50 mg total) by mouth daily.    Dispense:  30 capsule    Refill:  0    Do not fill until November 2017  . Liraglutide -Weight Management (SAXENDA) 18 MG/3ML SOPN    Sig: Inject 3 mg into the skin daily.    Dispense:  3 mL    Refill:  3            Problem List Items Addressed This Visit    None    Visit Diagnoses    Attention deficit hyperactivity disorder (ADHD), unspecified ADHD type       Relevant Medications   lisdexamfetamine (VYVANSE) 50 MG capsule   lisdexamfetamine (VYVANSE) 50 MG capsule   lisdexamfetamine (VYVANSE) 50  MG capsule   Morbid obesity due to excess calories (HCC)       Relevant Medications   lisdexamfetamine (VYVANSE) 50 MG capsule   lisdexamfetamine (VYVANSE) 50 MG capsule   lisdexamfetamine (VYVANSE) 50 MG capsule   Liraglutide -Weight Management (SAXENDA) 18 MG/3ML SOPN      Follow-up: Return in about 6 months (around 10/01/2016) for adhd.  Donato Schultz, DO

## 2016-04-08 ENCOUNTER — Other Ambulatory Visit: Payer: Self-pay

## 2016-04-08 MED ORDER — LEVOCETIRIZINE DIHYDROCHLORIDE 5 MG PO TABS: 5.0000 mg | ORAL_TABLET | Freq: Every evening | ORAL | 1 refills | Status: DC

## 2016-05-07 ENCOUNTER — Encounter: Payer: Self-pay | Admitting: Physician Assistant

## 2016-05-07 ENCOUNTER — Ambulatory Visit (INDEPENDENT_AMBULATORY_CARE_PROVIDER_SITE_OTHER): Payer: BC Managed Care – PPO | Admitting: Physician Assistant

## 2016-05-07 VITALS — BP 105/74 | HR 86 | Temp 97.7°F | Resp 16 | Ht 66.0 in | Wt 165.1 lb

## 2016-05-07 DIAGNOSIS — R1013 Epigastric pain: Secondary | ICD-10-CM | POA: Diagnosis not present

## 2016-05-07 LAB — COMPREHENSIVE METABOLIC PANEL
ALT: 29 U/L (ref 0–35)
AST: 19 U/L (ref 0–37)
Albumin: 3.7 g/dL (ref 3.5–5.2)
Alkaline Phosphatase: 83 U/L (ref 39–117)
BILIRUBIN TOTAL: 0.3 mg/dL (ref 0.2–1.2)
BUN: 9 mg/dL (ref 6–23)
CALCIUM: 8.4 mg/dL (ref 8.4–10.5)
CO2: 23 meq/L (ref 19–32)
CREATININE: 0.87 mg/dL (ref 0.40–1.20)
Chloride: 107 mEq/L (ref 96–112)
GFR: 75.22 mL/min (ref >60.00)
GLUCOSE: 104 mg/dL — AB (ref 70–99)
Potassium: 3.2 mEq/L — ABNORMAL LOW (ref 3.5–5.1)
SODIUM: 139 meq/L (ref 135–145)
Total Protein: 6.6 g/dL (ref 6.0–8.3)

## 2016-05-07 LAB — CBC
HCT: 40.6 % (ref 36.0–46.0)
Hemoglobin: 13.9 g/dL (ref 12.0–15.0)
MCHC: 34.4 g/dL (ref 30.0–36.0)
MCV: 92.8 fl (ref 78.0–100.0)
PLATELETS: 351 10*3/uL (ref 150.0–400.0)
RBC: 4.38 Mil/uL (ref 3.87–5.11)
RDW: 12.4 % (ref 11.5–15.5)
WBC: 12.8 10*3/uL — ABNORMAL HIGH (ref 4.0–10.5)

## 2016-05-07 LAB — LIPASE: Lipase: 31 U/L (ref 11.0–59.0)

## 2016-05-07 LAB — H. PYLORI ANTIBODY, IGG: H Pylori IgG: NEGATIVE

## 2016-05-07 MED ORDER — ONDANSETRON HCL 4 MG PO TABS: 4.0000 mg | ORAL_TABLET | Freq: Three times a day (TID) | ORAL | 0 refills | Status: DC | PRN

## 2016-05-07 MED ORDER — OMEPRAZOLE 20 MG PO CPDR: 20.0000 mg | DELAYED_RELEASE_CAPSULE | Freq: Every day | ORAL | 3 refills | Status: DC

## 2016-05-07 NOTE — Patient Instructions (Signed)
Please go to the lab for blood work. I will call with your results.  Stay hydrated and follow the diet below. I have given you a prescription for Prilosec for the indigestion. I have also sent in a prescription for nausea called zofran.  We may need imaging but we will get your lab results first since you are feeling better today.   Food Choices to Help Relieve Diarrhea, Adult When you have diarrhea, the foods you eat and your eating habits are very important. Choosing the right foods and drinks can help relieve diarrhea. Also, because diarrhea can last up to 7 days, you need to replace lost fluids and electrolytes (such as sodium, potassium, and chloride) in order to help prevent dehydration.  WHAT GENERAL GUIDELINES DO I NEED TO FOLLOW?  Slowly drink 1 cup (8 oz) of fluid for each episode of diarrhea. If you are getting enough fluid, your urine will be clear or pale yellow.  Eat starchy foods. Some good choices include white rice, white toast, pasta, low-fiber cereal, baked potatoes (without the skin), saltine crackers, and bagels.  Avoid large servings of any cooked vegetables.  Limit fruit to two servings per day. A serving is  cup or 1 small piece.  Choose foods with less than 2 g of fiber per serving.  Limit fats to less than 8 tsp (38 g) per day.  Avoid fried foods.  Eat foods that have probiotics in them. Probiotics can be found in certain dairy products.  Avoid foods and beverages that may increase the speed at which food moves through the stomach and intestines (gastrointestinal tract). Things to avoid include:  High-fiber foods, such as dried fruit, raw fruits and vegetables, nuts, seeds, and whole grain foods.  Spicy foods and high-fat foods.  Foods and beverages sweetened with high-fructose corn syrup, honey, or sugar alcohols such as xylitol, sorbitol, and mannitol. WHAT FOODS ARE RECOMMENDED? Grains White rice. White, [place], or pita breads (fresh or toasted),  including plain rolls, buns, or bagels. White pasta. Saltine, soda, or graham crackers. Pretzels. Low-fiber cereal. Cooked cereals made with water (such as cornmeal, farina, or cream cereals). Plain muffins. Matzo. Melba toast. Zwieback.  Vegetables Potatoes (without the skin). Strained tomato and vegetable juices. Most well-cooked and canned vegetables without seeds. Tender lettuce. Fruits Cooked or canned applesauce, apricots, cherries, fruit cocktail, grapefruit, peaches, pears, or plums. Fresh bananas, apples without skin, cherries, grapes, cantaloupe, grapefruit, peaches, oranges, or plums.  Meat and Other Protein Products Baked or boiled chicken. Eggs. Tofu. Fish. Seafood. Smooth peanut butter. Ground or well-cooked tender beef, ham, veal, lamb, pork, or poultry.  Dairy Plain yogurt, kefir, and unsweetened liquid yogurt. Lactose-free milk, buttermilk, or soy milk. Plain hard cheese. Beverages Sport drinks. Clear broths. Diluted fruit juices (except prune). Regular, caffeine-free sodas such as ginger ale. Water. Decaffeinated teas. Oral rehydration solutions. Sugar-free beverages not sweetened with sugar alcohols. Other Bouillon, broth, or soups made from recommended foods.  The items listed above may not be a complete list of recommended foods or beverages. Contact your dietitian for more options. WHAT FOODS ARE NOT RECOMMENDED? Grains Whole grain, whole wheat, bran, or rye breads, rolls, pastas, crackers, and cereals. Wild or brown rice. Cereals that contain more than 2 g of fiber per serving. Corn tortillas or taco shells. Cooked or dry oatmeal. Granola. Popcorn. Vegetables Raw vegetables. Cabbage, broccoli, Brussels sprouts, artichokes, baked beans, beet greens, corn, kale, legumes, peas, sweet potatoes, and yams. Potato skins. Cooked spinach and cabbage. Fruits Dried  fruit, including raisins and dates. Raw fruits. Stewed or dried prunes. Fresh apples with skin, apricots, mangoes, pears,  raspberries, and strawberries.  Meat and Other Protein Products Chunky peanut butter. Nuts and seeds. Beans and lentils. Tomasa Blase.  Dairy High-fat cheeses. Milk, chocolate milk, and beverages made with milk, such as milk shakes. Cream. Ice cream. Sweets and Desserts Sweet rolls, doughnuts, and sweet breads. Pancakes and waffles. Fats and Oils Butter. Cream sauces. Margarine. Salad oils. Plain salad dressings. Olives. Avocados.  Beverages Caffeinated beverages (such as coffee, tea, soda, or energy drinks). Alcoholic beverages. Fruit juices with pulp. Prune juice. Soft drinks sweetened with high-fructose corn syrup or sugar alcohols. Other Coconut. Hot sauce. Chili powder. Mayonnaise. Gravy. Cream-based or milk-based soups.  The items listed above may not be a complete list of foods and beverages to avoid. Contact your dietitian for more information. WHAT SHOULD I DO IF I BECOME DEHYDRATED? Diarrhea can sometimes lead to dehydration. Signs of dehydration include dark urine and dry mouth and skin. If you think you are dehydrated, you should rehydrate with an oral rehydration solution. These solutions can be purchased at pharmacies, retail stores, or online.  Drink -1 cup (120-240 mL) of oral rehydration solution each time you have an episode of diarrhea. If drinking this amount makes your diarrhea worse, try drinking smaller amounts more often. For example, drink 1-3 tsp (5-15 mL) every 5-10 minutes.  A general rule for staying hydrated is to drink 1-2 L of fluid per day. Talk to your health care provider about the specific amount you should be drinking each day. Drink enough fluids to keep your urine clear or pale yellow.   This information is not intended to replace advice given to you by your health care provider. Make sure you discuss any questions you have with your health care provider.   Document Released: 10/11/2003 Document Revised: 08/11/2014 Document Reviewed: 06/13/2013 Elsevier  Interactive Patient Education [employer]

## 2016-05-07 NOTE — Progress Notes (Signed)
Pre visit review using our clinic review tool, if applicable. No additional management support is needed unless otherwise documented below in the visit note/SLS  

## 2016-05-07 NOTE — Progress Notes (Signed)
Patient presents to clinic today c/o 3 episodes with the past 3 weeks where she has noted increased heartburn with indigestion, culminating in nausea and non-bloody emesis. Noted when this occurs, it seems to be random. Is not associated with meal time, stress levels or activity. Endorses some associated loose stools lasting 1-2 days after the episode. Denies melena, hematochezia or tenesmus. Endorses sometimes stool has a green coloration to it when these happens. Between episodes stools are normal consistency and color per patient. Patient denies alcohol consumption within the past 3-4 weeks. Denies recent travel or sick contact. Denies abnormal food or water source. Denies recent ABX use. Denies NSAID use at present. Has gallbladder but denies any issue with pain after heavy meal. Does note increase in fatty foods in the diet recently. Has never had an issue with this before, however. Most recent episode of symptoms was yesterday. Endorses some residual indigestion with mild epigastric discomfort.   Past Medical History:  Diagnosis Date  . ADD (attention deficit disorder)   . Depression   . Migraine   . Unspecified eustachian tube disorder     Current Outpatient Prescriptions on File Prior to Visit  Medication Sig Dispense Refill  . ALPRAZolam (XANAX) 0.5 MG tablet Take 1 tablet (0.5 mg total) by mouth 3 (three) times daily as needed for anxiety. 30 tablet 1  . DULoxetine (CYMBALTA) 60 MG capsule Take 1 capsule (60 mg total) by mouth 2 (two) times daily. 60 capsule 3  . fluticasone (FLONASE) 50 MCG/ACT nasal spray INSTILL 2 SPRAYS INTO EACH NOSTRIL DAILY 16 g 5  . gabapentin (NEURONTIN) 300 MG capsule Take 1 capsule by mouth 3 (three) times daily as needed.    . Insulin Pen Needle 32G X 6 MM MISC As directed 100 each 11  . levocetirizine (XYZAL) 5 MG tablet Take 1 tablet (5 mg total) by mouth every evening. 90 tablet 1  . Liraglutide -Weight Management (SAXENDA) 18 MG/3ML SOPN Inject 3 mg  into the skin daily. 3 mL 3  . lisdexamfetamine (VYVANSE) 50 MG capsule Take 1 capsule (50 mg total) by mouth daily. 30 capsule 0  . lisdexamfetamine (VYVANSE) 50 MG capsule Take 1 capsule (50 mg total) by mouth daily. 30 capsule 0  . lisdexamfetamine (VYVANSE) 50 MG capsule Take 1 capsule (50 mg total) by mouth daily. 30 capsule 0  . meloxicam (MOBIC) 15 MG tablet TAKE 1/2 TO 1 TABLET BY MOUTH EVERY DAY 30 tablet 2  . MINASTRIN 24 FE 1-20 MG-MCG(24) CHEW Chew 1 tablet by mouth daily.    . rizatriptan (MAXALT-MLT) 10 MG disintegrating tablet Take 1 tablet (10 mg total) by mouth as needed for migraine. May repeat in 2 hours if needed 10 tablet 0  . Sulfacetamide Sodium-Sulfur 10-5 % EMUL      No current facility-administered medications on file prior to visit.     Allergies  Allergen Reactions  . Amoxicillin-Pot Clavulanate     REACTION: Causes Yeast infection    Family History  Problem Relation Age of Onset  . ADD / ADHD Sister   . Other Father     Dyslexia  . Heart disease Father   . Hypertension Father   . Hypertension Mother     Social History   Social History  . Marital status: Married    Spouse name: N/A  . Number of children: N/A  . Years of education: N/A   Occupational History  . [employer]  History Main Topics  . Smoking status: Never Smoker  . Smokeless tobacco: Never Used  . Alcohol use 0.6 oz/week    1 Glasses of wine per week  . Drug use: No  . Sexual activity: Yes    Partners: Male   Other Topics Concern  . None   Social History Narrative   Exercise--  Yes --until she had stress fracture   Review of Systems - See HPI.  All other ROS are negative.  BP 105/74 (BP Location: Left Arm, Patient Position: Sitting, Cuff Size: Normal)   Pulse 86   Temp 97.7 F (36.5 C) (Oral)   Resp 16   Ht 5' 6" (1.676 m)   Wt 165 lb 2 oz (74.9 kg)   SpO2 97%   BMI 26.65 kg/m   Physical Exam  Constitutional: She is  oriented to person, place, and time and well-developed, well-nourished, and in no distress.  HENT:  Head: Normocephalic and atraumatic.  Eyes: Conjunctivae are normal.  Neck: Neck supple.  Cardiovascular: Normal rate, regular rhythm, normal heart sounds and intact distal pulses.   Pulmonary/Chest: Effort normal and breath sounds normal. No respiratory distress. She has no wheezes. She has no rales. She exhibits no tenderness.  Abdominal: Soft. Bowel sounds are normal. She exhibits no distension and no mass. There is tenderness in the left upper quadrant. There is no rebound, no guarding and negative Murphy's sign.  Neurological: She is alert and oriented to person, place, and time.  Skin: Skin is warm and dry. No rash noted.  Psychiatric: Affect normal.  Vitals reviewed.  Assessment/Plan: 1. Epigastric pain With episodes of heart burn, nausea and non-bloody emesis x 3, lasting about a day each. Some loose stools on days where pain is present. No alarm findings on exam. Negative RUQ tenderness or murphy sign. Mild LUQ tenderness noted. Will check labs today to include h. Pylori IgG, CBC, CMP and Lipase. BRAT diet started. Patient started on Prilosec. Probiotic recommended. Will alter regimen and proceed with imaging once labs are results. Do not feel any STAT imaging need as symptoms are improved today and there is no sign of acute abdomen on examination.  - omeprazole (PRILOSEC) 20 MG capsule; Take 1 capsule (20 mg total) by mouth daily.  Dispense: 30 capsule; Refill: 3 - CBC - Comp Met (CMET) - Lipase - H. pylori antibody, IgG   Leeanne Rio, PA-C

## 2016-05-08 ENCOUNTER — Encounter (HOSPITAL_COMMUNITY): Payer: Self-pay | Admitting: *Deleted

## 2016-05-08 ENCOUNTER — Telehealth: Payer: Self-pay | Admitting: Physician Assistant

## 2016-05-08 ENCOUNTER — Emergency Department (HOSPITAL_COMMUNITY)
Admission: EM | Admit: 2016-05-08 | Discharge: 2016-05-09 | Disposition: A | Discharge status: Home / Self Care | Payer: BC Managed Care – PPO | Attending: Emergency Medicine | Admitting: Emergency Medicine

## 2016-05-08 DIAGNOSIS — R112 Nausea with vomiting, unspecified: Secondary | ICD-10-CM

## 2016-05-08 DIAGNOSIS — D721 Eosinophilia: Secondary | ICD-10-CM

## 2016-05-08 DIAGNOSIS — N9489 Other specified conditions associated with female genital organs and menstrual cycle: Secondary | ICD-10-CM | POA: Diagnosis not present

## 2016-05-08 DIAGNOSIS — F909 Attention-deficit hyperactivity disorder, unspecified type: Secondary | ICD-10-CM | POA: Insufficient documentation

## 2016-05-08 DIAGNOSIS — K529 Noninfective gastroenteritis and colitis, unspecified: Secondary | ICD-10-CM | POA: Insufficient documentation

## 2016-05-08 DIAGNOSIS — R898 Other abnormal findings in specimens from other organs, systems and tissues: Secondary | ICD-10-CM

## 2016-05-08 DIAGNOSIS — R197 Diarrhea, unspecified: Secondary | ICD-10-CM

## 2016-05-08 DIAGNOSIS — R1084 Generalized abdominal pain: Secondary | ICD-10-CM

## 2016-05-08 DIAGNOSIS — R1013 Epigastric pain: Secondary | ICD-10-CM

## 2016-05-08 LAB — URINE MICROSCOPIC-ADD ON

## 2016-05-08 LAB — CBC
HCT: 42.8 % (ref 36.0–46.0)
Hemoglobin: 14.2 g/dL (ref 12.0–15.0)
MCH: 31.6 pg (ref 26.0–34.0)
MCHC: 33.2 g/dL (ref 30.0–36.0)
MCV: 95.1 fL (ref 78.0–100.0)
PLATELETS: 328 10*3/uL (ref 150–400)
RBC: 4.5 MIL/uL (ref 3.87–5.11)
RDW: 12.5 % (ref 11.5–15.5)
WBC: 10.4 10*3/uL (ref 4.0–10.5)

## 2016-05-08 LAB — URINALYSIS, ROUTINE W REFLEX MICROSCOPIC
GLUCOSE, UA: NEGATIVE mg/dL
Ketones, ur: NEGATIVE mg/dL
Nitrite: NEGATIVE
PROTEIN: NEGATIVE mg/dL
Specific Gravity, Urine: 1.025 (ref 1.005–1.030)
pH: 6.5 (ref 5.0–8.0)

## 2016-05-08 LAB — COMPREHENSIVE METABOLIC PANEL
ALK PHOS: 92 U/L (ref 38–126)
ALT: 39 U/L (ref 14–54)
AST: 25 U/L (ref 15–41)
Albumin: 3.6 g/dL (ref 3.5–5.0)
Anion gap: 8 (ref 5–15)
CALCIUM: 9 mg/dL (ref 8.9–10.3)
CHLORIDE: 111 mmol/L (ref 101–111)
CO2: 20 mmol/L — AB (ref 22–32)
CREATININE: 0.75 mg/dL (ref 0.44–1.00)
GFR calc non Af Amer: >60 mL/min (ref >60)
GLUCOSE: 138 mg/dL — AB (ref 65–99)
Potassium: 3.3 mmol/L — ABNORMAL LOW (ref 3.5–5.1)
SODIUM: 139 mmol/L (ref 135–145)
Total Bilirubin: 0.3 mg/dL (ref 0.3–1.2)
Total Protein: 6.5 g/dL (ref 6.5–8.1)

## 2016-05-08 LAB — LIPASE, BLOOD: LIPASE: 49 U/L (ref 11–51)

## 2016-05-08 LAB — I-STAT BETA HCG BLOOD, ED (MC, WL, AP ONLY)

## 2016-05-08 MED ORDER — SULFASALAZINE 500 MG PO TBEC
500.0000 mg | DELAYED_RELEASE_TABLET | Freq: Once | ORAL | Status: DC
  Filled 2016-05-08: qty 1

## 2016-05-08 MED ORDER — DICYCLOMINE HCL 10 MG/ML IM SOLN
20.0000 mg | Freq: Once | INTRAMUSCULAR | Status: AC
  Administered 2016-05-08: 20 mg via INTRAMUSCULAR
  Filled 2016-05-08: qty 2

## 2016-05-08 MED ORDER — SULFASALAZINE 500 MG PO TABS: 500.0000 mg | ORAL_TABLET | Freq: Four times a day (QID) | ORAL | 0 refills | Status: DC

## 2016-05-08 MED ORDER — SODIUM CHLORIDE 0.9 % IV BOLUS (SEPSIS)
1000.0000 mL | Freq: Once | INTRAVENOUS | Status: AC
  Administered 2016-05-08: 1000 mL via INTRAVENOUS

## 2016-05-08 NOTE — Telephone Encounter (Signed)
Spoke with lab coordinator at [facility] regarding differential. A copy has been faxed to our office. She reviewed the results verbally with me as I am not in office today. Looked good overall but eosinophil count was elevated which can indicate an inflammatory or parasitic cause.   I would like to do two things -- (1) have her come to lab to pick up stool studies kit -- need order for ova and parasite and stool culture. (2) I would like an urgent referral placed to Gastroenterology for her giving her symptoms. Labs overall have looked fantastic but I want her to have assessment by the specialist. She needs to continue care discussed at visit yesterday. If anything recurs before we can get her in to see the specialist, I want her to go to the ER.   Please check with Delsa Sale or Drue Dun and see if they can have the patient seen within 1 week.

## 2016-05-08 NOTE — ED Notes (Signed)
Pt states she has had these symptoms on and off for about a month. She saw her primary care doctor and they told her to come to the ER if she continued to have pain.

## 2016-05-08 NOTE — ED Triage Notes (Signed)
Patient presents with c/o abd pain, N/V/D times 3-4 days with this reoccurring every 8-10 days.  Her MD instructed her to come to the ED if the pain increased

## 2016-05-08 NOTE — Discharge Instructions (Addendum)
As discussed, your evaluation today has been largely reassuring.  But, it is important that you monitor your condition carefully, and do not hesitate to return to the ED if you develop new, or concerning changes in your condition. ? ?Otherwise, please follow-up with your physician for appropriate ongoing care. ? ?

## 2016-05-08 NOTE — Telephone Encounter (Signed)
Patient notified of results and verbalized understanding. Orders for placed as directed. Discussed referral w/ Danford Bad and she will try to get pt in w/ GI within 1 week.

## 2016-05-08 NOTE — ED Notes (Signed)
Patient pale color and feels like she is going to pass out

## 2016-05-08 NOTE — ED Provider Notes (Signed)
[facility]-EMERGENCY DEPT Provider Note   CSN: [id number] Arrival date & time: 05/08/16  2014     History   Chief Complaint Chief Complaint  Patient presents with  . Abdominal Pain  . Nausea  . Emesis  . Diarrhea    HPI Jillian Green is a [age] y.o. female.  HPI History presents with concern of abdominal pain. She notes over the past few weeks she has had multiple episodes of nausea, vomiting, diarrhea, diffuse abdominal pain. Pain is crampy, occurs without clear precipitant, lasts for a few days, resolves without clear intervention. Description episode has been present for 4 days. During this episode she has seen her primary care physician, been referred to gastroenterology, but has not yet made that appointment. Patient was pregnant is in yesterday for pain relief, has not yet obtained her prescription. She denies fever, chills, confusion, this rotation, chest pain. Patient has history of prior C-section, as well as history of fibromyalgia, but states that this pain is distinct from prior fibromyalgia episodes.  Past Medical History:  Diagnosis Date  . ADD (attention deficit disorder)   . Depression   . Migraine   . Unspecified eustachian tube disorder     Patient Active Problem List   Diagnosis Date Noted  . Fibromyalgia 07/26/2015  . Right foot sprain 10/10/2014  . Acute pharyngitis 09/04/2014  . ADD (attention deficit disorder) 09/04/2014  . CTS (carpal tunnel syndrome) 09/23/2013  . Dizzy 09/23/2013  . Headache(784.0) 09/23/2013  . Left tennis elbow 10/19/2012  . Overweight 02/21/2011  . WEIGHT GAIN 07/09/2010  . ANXIETY, SITUATIONAL 01/07/2010  . HIP PAIN, LEFT 03/02/2009  . SINUSITIS- ACUTE-NOS 08/24/2008  . RHINITIS 07/12/2008  . ADD 05/22/2008  . COMMON MIGRAINE 03/26/2007  . DISORDER, EUSTACHIAN TUBE NOS 03/26/2007  . DEPRESSION 08/29/2006    Past Surgical History:  Procedure Laterality Date  . SINUS EXPLORATION      OB History    No data  available       Home Medications    Prior to Admission medications   Medication Sig Start Date End Date Taking? Authorizing Provider  ALPRAZolam Prudy Feeler(XANAX) 0.5 MG tablet Take 1 tablet (0.5 mg total) by mouth 3 (three) times daily as needed for anxiety. 01/01/16  Yes Yvonne R Lowne Chase, DO  DULoxetine (CYMBALTA) 60 MG capsule Take 1 capsule (60 mg total) by mouth 2 (two) times daily. 07/26/15  Yes Yvonne R Lowne Chase, DO  fluticasone [facility](FLONASE) 50 MCG/ACT nasal spray INSTILL 2 SPRAYS INTO EACH NOSTRIL DAILY 02/01/16  Yes Yvonne R Lowne Chase, DO  gabapentin (NEURONTIN) 300 MG capsule Take 1 capsule by mouth 3 (three) times daily as needed (for pain).  03/12/15  Yes Historical Provider, MD  levocetirizine (XYZAL) 5 MG tablet Take 1 tablet (5 mg total) by mouth every evening. 04/08/16  Yes Yvonne R Lowne Chase, DO  Liraglutide -Weight Management (SAXENDA) 18 MG/3ML SOPN Inject 3 mg into the skin daily. 04/01/16  Yes Yvonne R Lowne Chase, DO  lisdexamfetamine (VYVANSE) 50 MG capsule Take 1 capsule (50 mg total) by mouth daily. 04/01/16  Yes Yvonne R Lowne Chase, DO  Norethindrone-Mestranol (NECON 1/50, 28,) 1-50 MG-MCG tablet Take 1 tablet by mouth daily.   Yes Historical Provider, MD  rizatriptan (MAXALT-MLT) 10 MG disintegrating tablet Take 1 tablet (10 mg total) by mouth as needed for migraine. May repeat in 2 hours if needed 04/10/15  Yes Yvonne R Lowne Chase, DO  topiramate (TOPAMAX) 100 MG tablet Take 100 mg by  mouth daily.   Yes Historical Provider, MD  Insulin Pen Needle 32G X 6 MM MISC As directed 02/14/16   Lelon Perla Chase, DO  lisdexamfetamine (VYVANSE) 50 MG capsule Take 1 capsule (50 mg total) by mouth daily. Patient not taking: Reported on 05/08/2016 04/01/16   Lelon Perla Chase, DO  lisdexamfetamine (VYVANSE) 50 MG capsule Take 1 capsule (50 mg total) by mouth daily. Patient not taking: Reported on 05/08/2016 04/01/16   Lelon Perla Chase, DO  omeprazole (PRILOSEC) 20 MG capsule Take 1  capsule (20 mg total) by mouth daily. 05/07/16   Waldon Merl, PA-C  ondansetron (ZOFRAN) 4 MG tablet Take 1 tablet (4 mg total) by mouth every 8 (eight) hours as needed for nausea or vomiting. 05/07/16   Waldon Merl, PA-C  sulfaSALAzine (AZULFIDINE) 500 MG tablet Take 1 tablet (500 mg total) by mouth 4 (four) times daily. 05/08/16   Gerhard Munch, MD    Family History Family History  Problem Relation Age of Onset  . Other Father     Dyslexia  . Heart disease Father   . Hypertension Father   . Hypertension Mother   . ADD / ADHD Sister     Social History Social History  Substance Use Topics  . Smoking status: Never Smoker  . Smokeless tobacco: Never Used  . Alcohol use 0.6 oz/week    1 Glasses of wine per week     Allergies   Amoxicillin-pot clavulanate   Review of Systems Review of Systems  Constitutional:       Per HPI, otherwise negative  HENT:       Per HPI, otherwise negative  Respiratory:       Per HPI, otherwise negative  Cardiovascular:       Per HPI, otherwise negative  Gastrointestinal: Positive for abdominal pain, diarrhea, nausea and vomiting.  Endocrine:       Negative aside from HPI  Genitourinary:       Neg aside from HPI   Musculoskeletal:       Per HPI, otherwise negative  Skin: Negative.   Neurological: Negative for syncope.     Physical Exam Updated Vital Signs BP (!) 116/104   Pulse 68   Temp 97.7 F (36.5 C) (Oral)   Resp 18   Ht 5\' 5"  (1.651 m)   Wt 165 lb 3.2 oz (74.9 kg)   SpO2 100%   BMI 27.49 kg/m   Physical Exam  Constitutional: She is oriented to person, place, and time. She appears well-developed and well-nourished. No distress.  HENT:  Head: Normocephalic and atraumatic.  Eyes: Conjunctivae and EOM are normal.  Cardiovascular: Normal rate and regular rhythm.   Pulmonary/Chest: Effort normal and breath sounds normal. No stridor. No respiratory distress.  Abdominal: She exhibits no distension.  Mild diffuse  tenderness with guarding  Musculoskeletal: She exhibits no edema.  Neurological: She is alert and oriented to person, place, and time. No cranial nerve deficit.  Skin: Skin is warm and dry.  Psychiatric: She has a normal mood and affect.  Nursing note and vitals reviewed.    ED Treatments / Results  Labs (all labs ordered are listed, but only abnormal results are displayed) Labs Reviewed  COMPREHENSIVE METABOLIC PANEL - Abnormal; Notable for the following:       Result Value   Potassium 3.3 (*)    CO2 20 (*)    Glucose, Bld 138 (*)    BUN <5 (*)  All other components within normal limits  URINALYSIS, ROUTINE W REFLEX MICROSCOPIC (NOT AT [facility]) - Abnormal; Notable for the following:    APPearance CLOUDY (*)    Hgb urine dipstick TRACE (*)    Bilirubin Urine SMALL (*)    Leukocytes, UA MODERATE (*)    All other components within normal limits  URINE MICROSCOPIC-ADD ON - Abnormal; Notable for the following:    Squamous Epithelial / LPF 6-30 (*)    Bacteria, UA FEW (*)    Crystals CA OXALATE CRYSTALS (*)    All other components within normal limits  LIPASE, BLOOD  CBC  I-STAT BETA HCG BLOOD, ED ([facility], [facility], [facility] ONLY)     Radiology Ct Abdomen Pelvis W Contrast  Result Date: 05/09/2016 CLINICAL DATA:  Abdominal pain, nausea, vomiting and diarrhea for 1 month. EXAM: CT ABDOMEN AND PELVIS WITH CONTRAST TECHNIQUE: Multidetector CT imaging of the abdomen and pelvis was performed using the standard protocol following bolus administration of intravenous contrast. CONTRAST:  100 cc ISOVUE-300 IOPAMIDOL (ISOVUE-300) INJECTION 61% COMPARISON:  None. FINDINGS: LOWER CHEST: Lung bases are clear. Included heart size is normal. No pericardial effusion. HEPATOBILIARY: Liver and gallbladder are normal. PANCREAS: Normal. SPLEEN: Normal. ADRENALS/URINARY TRACT: Kidneys are orthotopic, demonstrating symmetric enhancement. Punctate LEFT interpolar nephrolithiasis. No hydronephrosis or solid renal masses.  The unopacified ureters are normal in course and caliber. Delayed imaging through the kidneys demonstrates symmetric prompt contrast excretion within the proximal urinary collecting system. Urinary bladder is partially distended and unremarkable. Normal adrenal glands. STOMACH/BOWEL: The stomach, small and large bowel are normal in course and caliber without inflammatory changes. Normal appendix. VASCULAR/LYMPHATIC: Aortoiliac vessels are normal in course and caliber, minimal calcific atherosclerosis. No lymphadenopathy by CT size criteria. REPRODUCTIVE: Normal. OTHER: No intraperitoneal free fluid or free air. MUSCULOSKELETAL: Nonacute. Small fat containing umbilical hernia. Moderate L5-S1 degenerative disc resulting in moderate to severe bilateral L5-S1 neural foraminal narrowing. IMPRESSION: No acute intra-abdominal or pelvic process. Punctate nonobstructing LEFT nephrolithiasis. Electronically Signed   By: Awilda Metro M.D.   On: 05/09/2016 01:32    Procedures Procedures (including critical care time)    Initial Impression / Assessment and Plan / ED Course  I have reviewed the triage vital signs and the nursing notes.  Pertinent labs & imaging results that were available during my care of the patient were reviewed by me and considered in my medical decision making (see chart for details).  Clinical Course    1:58 AM Patient appears calm. She, her companion and I discussed all findings, reassuring CT results, labs, she stated that she improved with medication provided here, though did not have complete resolution of her pain per However, the patient has resources to follow-up with GI, was discharged with instructions to call in the morning for prompt follow-up.   Final Clinical Impressions(s) / ED Diagnoses  Patient presents with ongoing episodic abdominal pain, diffuse, with nausea, vomiting, diarrhea. Patient has no known history of IB S. Here the patient is awake and alert, with  soft, non-peritoneal abdomen. However, given her description of worsening severity, worsening nausea, vomiting, diarrhea, patient had CT scan performed in addition to labs per Result reassuring, patient improved her with medication. Patient discharged in stable condition with outpatient GI follow-up.   Gerhard Munch, MD 05/09/16 [phone]

## 2016-05-08 NOTE — ED Provider Notes (Deleted)
11:17 PM Patient awake and alert. She is aware of all findings, including CT evidence for colitis and terminal ileitis. The patient states that she is unaware of any prior similar results, she states that she has had extensive evaluation with gastroenterology in the past, she is seemingly unaware of prior CT scan from several years ago with similar findings. We discussed the initiation of new medication, with the importance of GI follow-up. Patient unable to this plan, discharged in stable condition.   Gerhard Munchobert Ester Mabe, MD 05/08/16 [phone]

## 2016-05-09 ENCOUNTER — Encounter: Payer: Self-pay | Admitting: Family Medicine

## 2016-05-09 ENCOUNTER — Emergency Department (HOSPITAL_COMMUNITY): Payer: BC Managed Care – PPO

## 2016-05-09 ENCOUNTER — Encounter (HOSPITAL_COMMUNITY): Payer: Self-pay | Admitting: Radiology

## 2016-05-09 MED ORDER — DICYCLOMINE HCL 20 MG PO TABS: 20.0000 mg | ORAL_TABLET | Freq: Two times a day (BID) | ORAL | 0 refills | Status: DC

## 2016-05-09 MED ORDER — IOPAMIDOL (ISOVUE-300) INJECTION 61%
INTRAVENOUS | Status: AC
  Administered 2016-05-09: 100 mL
  Filled 2016-05-09: qty 100

## 2016-05-12 ENCOUNTER — Encounter: Payer: Self-pay | Admitting: Physician Assistant

## 2016-05-12 ENCOUNTER — Ambulatory Visit (INDEPENDENT_AMBULATORY_CARE_PROVIDER_SITE_OTHER): Payer: BC Managed Care – PPO | Admitting: Physician Assistant

## 2016-05-12 VITALS — BP 108/72 | HR 94 | Temp 97.7°F | Resp 16 | Ht 66.0 in | Wt 165.0 lb

## 2016-05-12 DIAGNOSIS — G8929 Other chronic pain: Secondary | ICD-10-CM

## 2016-05-12 DIAGNOSIS — R1013 Epigastric pain: Secondary | ICD-10-CM | POA: Diagnosis not present

## 2016-05-12 NOTE — Patient Instructions (Signed)
Please continue bland diet. Restart Prilosec and probiotic.   Hydrate and rest. Avoid sugary drinks. A G2 Gatorade is a good option to replenish electrolytes.  Please start the diet below -- a bland diet. Bring in stool specimen for assessment.  Schedule Follow-up with your primary care to discuss chronic medications.  Follow-up with Gastroenterology as scheduled. Thursday for your new patient appointment.    Food Choices to Help Relieve Diarrhea, Adult When you have diarrhea, the foods you eat and your eating habits are very important. Choosing the right foods and drinks can help relieve diarrhea. Also, because diarrhea can last up to 7 days, you need to replace lost fluids and electrolytes (such as sodium, potassium, and chloride) in order to help prevent dehydration.  WHAT GENERAL GUIDELINES DO I NEED TO FOLLOW?  Slowly drink 1 cup (8 oz) of fluid for each episode of diarrhea. If you are getting enough fluid, your urine will be clear or pale yellow.  Eat starchy foods. Some good choices include white rice, white toast, pasta, low-fiber cereal, baked potatoes (without the skin), saltine crackers, and bagels.  Avoid large servings of any cooked vegetables.  Limit fruit to two servings per day. A serving is  cup or 1 small piece.  Choose foods with less than 2 g of fiber per serving.  Limit fats to less than 8 tsp (38 g) per day.  Avoid fried foods.  Eat foods that have probiotics in them. Probiotics can be found in certain dairy products.  Avoid foods and beverages that may increase the speed at which food moves through the stomach and intestines (gastrointestinal tract). Things to avoid include:  High-fiber foods, such as dried fruit, raw fruits and vegetables, nuts, seeds, and whole grain foods.  Spicy foods and high-fat foods.  Foods and beverages sweetened with high-fructose corn syrup, honey, or sugar alcohols such as xylitol, sorbitol, and mannitol. WHAT FOODS ARE  RECOMMENDED? Grains White rice. White, [place], or pita breads (fresh or toasted), including plain rolls, buns, or bagels. White pasta. Saltine, soda, or graham crackers. Pretzels. Low-fiber cereal. Cooked cereals made with water (such as cornmeal, farina, or cream cereals). Plain muffins. Matzo. Melba toast. Zwieback.  Vegetables Potatoes (without the skin). Strained tomato and vegetable juices. Most well-cooked and canned vegetables without seeds. Tender lettuce. Fruits Cooked or canned applesauce, apricots, cherries, fruit cocktail, grapefruit, peaches, pears, or plums. Fresh bananas, apples without skin, cherries, grapes, cantaloupe, grapefruit, peaches, oranges, or plums.  Meat and Other Protein Products Baked or boiled chicken. Eggs. Tofu. Fish. Seafood. Smooth peanut butter. Ground or well-cooked tender beef, ham, veal, lamb, pork, or poultry.  Dairy Plain yogurt, kefir, and unsweetened liquid yogurt. Lactose-free milk, buttermilk, or soy milk. Plain hard cheese. Beverages Sport drinks. Clear broths. Diluted fruit juices (except prune). Regular, caffeine-free sodas such as ginger ale. Water. Decaffeinated teas. Oral rehydration solutions. Sugar-free beverages not sweetened with sugar alcohols. Other Bouillon, broth, or soups made from recommended foods.  The items listed above may not be a complete list of recommended foods or beverages. Contact your dietitian for more options. WHAT FOODS ARE NOT RECOMMENDED? Grains Whole grain, whole wheat, bran, or rye breads, rolls, pastas, crackers, and cereals. Wild or brown rice. Cereals that contain more than 2 g of fiber per serving. Corn tortillas or taco shells. Cooked or dry oatmeal. Granola. Popcorn. Vegetables Raw vegetables. Cabbage, broccoli, Brussels sprouts, artichokes, baked beans, beet greens, corn, kale, legumes, peas, sweet potatoes, and yams. Potato skins. Cooked spinach and  cabbage. Fruits Dried fruit, including raisins and dates.  Raw fruits. Stewed or dried prunes. Fresh apples with skin, apricots, mangoes, pears, raspberries, and strawberries.  Meat and Other Protein Products Chunky peanut butter. Nuts and seeds. Beans and lentils. Tomasa Blase.  Dairy High-fat cheeses. Milk, chocolate milk, and beverages made with milk, such as milk shakes. Cream. Ice cream. Sweets and Desserts Sweet rolls, doughnuts, and sweet breads. Pancakes and waffles. Fats and Oils Butter. Cream sauces. Margarine. Salad oils. Plain salad dressings. Olives. Avocados.  Beverages Caffeinated beverages (such as coffee, tea, soda, or energy drinks). Alcoholic beverages. Fruit juices with pulp. Prune juice. Soft drinks sweetened with high-fructose corn syrup or sugar alcohols. Other Coconut. Hot sauce. Chili powder. Mayonnaise. Gravy. Cream-based or milk-based soups.  The items listed above may not be a complete list of foods and beverages to avoid. Contact your dietitian for more information. WHAT SHOULD I DO IF I BECOME DEHYDRATED? Diarrhea can sometimes lead to dehydration. Signs of dehydration include dark urine and dry mouth and skin. If you think you are dehydrated, you should rehydrate with an oral rehydration solution. These solutions can be purchased at pharmacies, retail stores, or online.  Drink -1 cup (120-240 mL) of oral rehydration solution each time you have an episode of diarrhea. If drinking this amount makes your diarrhea worse, try drinking smaller amounts more often. For example, drink 1-3 tsp (5-15 mL) every 5-10 minutes.  A general rule for staying hydrated is to drink 1-2 L of fluid per day. Talk to your health care provider about the specific amount you should be drinking each day. Drink enough fluids to keep your urine clear or pale yellow.   This information is not intended to replace advice given to you by your health care provider. Make sure you discuss any questions you have with your health care provider.   Document Released:  10/11/2003 Document Revised: 08/11/2014 Document Reviewed: 06/13/2013 Elsevier Interactive Patient Education [employer]

## 2016-05-12 NOTE — Progress Notes (Signed)
Patient presents to clinic today for follow-up of continued abdominal pain with episodic nausea and vomiting. Patient initially seen in office on 05/07/16. Labs at that time unremarkable. Patient presented to ER on 05/08/2016. Ct obtained an unremarkable. Patient was set up with GI for further evaluation. Has appointment scheduled for 05/15/2016. Patient endorses improvement in symptoms since ER assessment. Was told Saxenda could be contributing so has stopped medication. Endorses today is the first day without nausea, vomiting or diarrhea. Some mild epigastric pain persists. Denies fever, chills. Notes some weakness and lightheadedness that she feels is secondary to dehydration. Is trying to hydrate better today. Eating only bland foods.    Past Medical History:  Diagnosis Date  . ADD (attention deficit disorder)   . Depression   . Migraine   . Unspecified eustachian tube disorder     Current Outpatient Prescriptions on File Prior to Visit  Medication Sig Dispense Refill  . Norethindrone-Mestranol (NECON 1/50, 28,) 1-50 MG-MCG tablet Take 1 tablet by mouth daily.    [name] ALPRAZolam (XANAX) 0.5 MG tablet Take 1 tablet (0.5 mg total) by mouth 3 (three) times daily as needed for anxiety. (Patient not taking: Reported on 05/12/2016) 30 tablet 1  . dicyclomine (BENTYL) 20 MG tablet Take 1 tablet (20 mg total) by mouth 2 (two) times daily. (Patient not taking: Reported on 05/12/2016) 20 tablet 0  . DULoxetine (CYMBALTA) 60 MG capsule Take 1 capsule (60 mg total) by mouth 2 (two) times daily. (Patient not taking: Reported on 05/12/2016) 60 capsule 3  . fluticasone (FLONASE) 50 MCG/ACT nasal spray INSTILL 2 SPRAYS INTO EACH NOSTRIL DAILY (Patient not taking: Reported on 05/12/2016) 16 g 5  . gabapentin (NEURONTIN) 300 MG capsule Take 1 capsule by mouth 3 (three) times daily as needed (for pain).     . Insulin Pen Needle 32G X 6 MM MISC As directed (Patient not taking: Reported on 05/12/2016) 100 each 11  .  levocetirizine (XYZAL) 5 MG tablet Take 1 tablet (5 mg total) by mouth every evening. (Patient not taking: Reported on 05/12/2016) 90 tablet 1  . Liraglutide -Weight Management (SAXENDA) 18 MG/3ML SOPN Inject 3 mg into the skin daily. (Patient not taking: Reported on 05/12/2016) 3 mL 3  . lisdexamfetamine (VYVANSE) 50 MG capsule Take 1 capsule (50 mg total) by mouth daily. (Patient not taking: Reported on 05/12/2016) 30 capsule 0  . lisdexamfetamine (VYVANSE) 50 MG capsule Take 1 capsule (50 mg total) by mouth daily. (Patient not taking: Reported on 05/12/2016) 30 capsule 0  . lisdexamfetamine (VYVANSE) 50 MG capsule Take 1 capsule (50 mg total) by mouth daily. (Patient not taking: Reported on 05/12/2016) 30 capsule 0  . omeprazole (PRILOSEC) 20 MG capsule Take 1 capsule (20 mg total) by mouth daily. (Patient not taking: Reported on 05/12/2016) 30 capsule 3  . ondansetron (ZOFRAN) 4 MG tablet Take 1 tablet (4 mg total) by mouth every 8 (eight) hours as needed for nausea or vomiting. (Patient not taking: Reported on 05/12/2016) 20 tablet 0  . rizatriptan (MAXALT-MLT) 10 MG disintegrating tablet Take 1 tablet (10 mg total) by mouth as needed for migraine. May repeat in 2 hours if needed (Patient not taking: Reported on 05/12/2016) 10 tablet 0  . topiramate (TOPAMAX) 100 MG tablet Take 100 mg by mouth daily.     No current facility-administered medications on file prior to visit.     Allergies  Allergen Reactions  . Amoxicillin-Pot Clavulanate     REACTION: Causes Yeast infection  Family History  Problem Relation Age of Onset  . Other Father     Dyslexia  . Heart disease Father   . Hypertension Father   . Hypertension Mother   . ADD / ADHD Sister     Social History   Social History  . Marital status: Married    Spouse name: N/A  . Number of children: N/A  . Years of education: N/A   Occupational History  . [employer] [place] History Main Topics  .  Smoking status: Never Smoker  . Smokeless tobacco: Never Used  . Alcohol use 0.6 oz/week    1 Glasses of wine per week  . Drug use: No  . Sexual activity: Yes    Partners: Male   Other Topics Concern  . None   Social History Narrative   Exercise--  Yes --until she had stress fracture    Review of Systems - See HPI.  All other ROS are negative.  BP (!) 0/0 (BP Location: Left Arm, Patient Position: Sitting, Cuff Size: Large)   Ht _0  (1.676 m)   Wt 165 lb (74.8 kg)   BMI 26.63 kg/m   Physical Exam  Constitutional: She is oriented to person, place, and time and well-developed, well-nourished, and in no distress.  HENT:  Head: Normocephalic.  Eyes: Conjunctivae are normal. Pupils are equal, round, and reactive to light.  Neck: Neck supple.  Cardiovascular: Normal rate, regular rhythm, normal heart sounds and intact distal pulses.   Pulmonary/Chest: Effort normal and breath sounds normal. No respiratory distress. She has no wheezes. She has no rales. She exhibits no tenderness.  Abdominal: Soft. Bowel sounds are normal. She exhibits no distension and no mass. There is no tenderness. There is no rebound and no guarding.  Neurological: She is alert and oriented to person, place, and time.  Skin: Skin is warm and dry. No rash noted.  Psychiatric: Affect normal.  Vitals reviewed.   Recent Results (from the past 2160 hour(s))  CBC     Status: Abnormal   Collection Time: 05/07/16 11:53 AM  Result Value Ref Range   WBC 12.8 (H) 4.0 - 10.5 K/uL   RBC 4.38 3.87 - 5.11 Mil/uL   Platelets 351.0 150.0 - 400.0 K/uL   Hemoglobin 13.9 12.0 - 15.0 g/dL   HCT 40.6 36.0 - 46.0 %   MCV 92.8 78.0 - 100.0 fl   MCHC 34.4 30.0 - 36.0 g/dL   RDW 12.4 11.5 - 15.5 %  Comp Met (CMET)     Status: Abnormal   Collection Time: 05/07/16 11:53 AM  Result Value Ref Range   Sodium 139 135 - 145 mEq/L   Potassium 3.2 (L) 3.5 - 5.1 mEq/L   Chloride 107 96 - 112 mEq/L   CO2 23 19 - 32 mEq/L    Glucose, Bld 104 (H) 70 - 99 mg/dL   BUN 9 6 - 23 mg/dL   Creatinine, Ser 0.87 0.40 - 1.20 mg/dL   Total Bilirubin 0.3 0.2 - 1.2 mg/dL   Alkaline Phosphatase 83 39 - 117 U/L   AST 19 0 - 37 U/L   ALT 29 0 - 35 U/L   Total Protein 6.6 6.0 - 8.3 g/dL   Albumin 3.7 3.5 - 5.2 g/dL   Calcium 8.4 8.4 - 10.5 mg/dL   GFR 75.22 >60.00 mL/min  Lipase     Status: None   Collection Time: 05/07/16 11:53 AM  Result Value Ref Range  Lipase 31.0 11.0 - 59.0 U/L  H. pylori antibody, IgG     Status: None   Collection Time: 05/07/16 11:53 AM  Result Value Ref Range   H Pylori IgG Negative Negative  Urinalysis, Routine w reflex microscopic     Status: Abnormal   Collection Time: 05/08/16  8:55 PM  Result Value Ref Range   Color, Urine YELLOW YELLOW   APPearance CLOUDY (A) CLEAR   Specific Gravity, Urine 1.025 1.005 - 1.030   pH 6.5 5.0 - 8.0   Glucose, UA NEGATIVE NEGATIVE mg/dL   Hgb urine dipstick TRACE (A) NEGATIVE   Bilirubin Urine SMALL (A) NEGATIVE   Ketones, ur NEGATIVE NEGATIVE mg/dL   Protein, ur NEGATIVE NEGATIVE mg/dL   Nitrite NEGATIVE NEGATIVE   Leukocytes, UA MODERATE (A) NEGATIVE  Urine microscopic-add on     Status: Abnormal   Collection Time: 05/08/16  8:55 PM  Result Value Ref Range   Squamous Epithelial / LPF 6-30 (A) NONE SEEN   WBC, UA 6-30 0 - 5 WBC/hpf   RBC / HPF 0-5 0 - 5 RBC/hpf   Bacteria, UA FEW (A) NONE SEEN   Crystals CA OXALATE CRYSTALS (A) NEGATIVE   Urine-Other MUCOUS PRESENT   Lipase, blood     Status: None   Collection Time: 05/08/16  9:17 PM  Result Value Ref Range   Lipase 49 11 - 51 U/L  Comprehensive metabolic panel     Status: Abnormal   Collection Time: 05/08/16  9:17 PM  Result Value Ref Range   Sodium 139 135 - 145 mmol/L   Potassium 3.3 (L) 3.5 - 5.1 mmol/L   Chloride 111 101 - 111 mmol/L   CO2 20 (L) 22 - 32 mmol/L   Glucose, Bld 138 (H) 65 - 99 mg/dL   BUN <5 (L) 6 - 20 mg/dL   Creatinine, Ser 0.75 0.44 - 1.00 mg/dL   Calcium 9.0 8.9  - 10.3 mg/dL   Total Protein 6.5 6.5 - 8.1 g/dL   Albumin 3.6 3.5 - 5.0 g/dL   AST 25 15 - 41 U/L   ALT 39 14 - 54 U/L   Alkaline Phosphatase 92 38 - 126 U/L   Total Bilirubin 0.3 0.3 - 1.2 mg/dL   GFR calc non Af Amer >60 >60 mL/min   GFR calc Af Amer >60 >60 mL/min    Comment: (NOTE) The eGFR has been calculated using the CKD EPI equation. This calculation has not been validated in all clinical situations. eGFR's persistently <60 mL/min signify possible Chronic Kidney Disease.    Anion gap 8 5 - 15  CBC     Status: None   Collection Time: 05/08/16  9:17 PM  Result Value Ref Range   WBC 10.4 4.0 - 10.5 K/uL   RBC 4.50 3.87 - 5.11 MIL/uL   Hemoglobin 14.2 12.0 - 15.0 g/dL   HCT 42.8 36.0 - 46.0 %   MCV 95.1 78.0 - 100.0 fL   MCH 31.6 26.0 - 34.0 pg   MCHC 33.2 30.0 - 36.0 g/dL   RDW 12.5 11.5 - 15.5 %   Platelets 328 150 - 400 K/uL  I-Stat beta hCG blood, ED     Status: None   Collection Time: 05/08/16  9:53 PM  Result Value Ref Range   I-stat hCG, quantitative <5.0 <5 mIU/mL   Comment 3            Comment:   GEST. AGE      CONC.  (  mIU/mL)   <=1 WEEK        5 - 50     2 WEEKS       50 - 500     3 WEEKS       100 - 10,000     4 WEEKS     1,000 - 30,000        FEMALE AND NON-PREGNANT FEMALE:     LESS THAN 5 mIU/mL     Assessment/Plan: 1. Abdominal pain, chronic, epigastric Will repeat BMP today due to lightheadedness. CBC in ER stable. Continue BRAT diet. Continue hydration. OVS stable today. Kirke Shaggy is a potential culprit. Will remain off of medication. Patient to follow-up with GI as scheduled.   - Basic metabolic panel   Leeanne Rio, PA-C

## 2016-05-12 NOTE — Progress Notes (Signed)
Pre visit review using our clinic review tool, if applicable. No additional management support is needed unless otherwise documented below in the visit note/SLS  

## 2016-05-13 LAB — BASIC METABOLIC PANEL
BUN: 5 mg/dL — ABNORMAL LOW (ref 6–23)
CO2: 24 mEq/L (ref 19–32)
Calcium: 8.8 mg/dL (ref 8.4–10.5)
Chloride: 106 mEq/L (ref 96–112)
Creatinine, Ser: 0.68 mg/dL (ref 0.40–1.20)
GFR: 99.95 mL/min (ref >60.00)
GLUCOSE: 200 mg/dL — AB (ref 70–99)
POTASSIUM: 3.8 meq/L (ref 3.5–5.1)
SODIUM: 139 meq/L (ref 135–145)

## 2016-05-15 ENCOUNTER — Ambulatory Visit (INDEPENDENT_AMBULATORY_CARE_PROVIDER_SITE_OTHER): Payer: BC Managed Care – PPO | Admitting: Nurse Practitioner

## 2016-05-15 ENCOUNTER — Encounter: Payer: Self-pay | Admitting: Nurse Practitioner

## 2016-05-15 ENCOUNTER — Other Ambulatory Visit (INDEPENDENT_AMBULATORY_CARE_PROVIDER_SITE_OTHER): Payer: BC Managed Care – PPO

## 2016-05-15 VITALS — BP 120/68 | HR 100 | Ht 65.0 in | Wt 164.5 lb

## 2016-05-15 DIAGNOSIS — R112 Nausea with vomiting, unspecified: Secondary | ICD-10-CM

## 2016-05-15 DIAGNOSIS — R197 Diarrhea, unspecified: Secondary | ICD-10-CM

## 2016-05-15 LAB — TSH: TSH: 1.63 u[IU]/mL (ref 0.35–4.50)

## 2016-05-15 NOTE — Patient Instructions (Signed)
Please go to the basement level to have your labs drawn.  We will call you with the results. 

## 2016-05-15 NOTE — Progress Notes (Signed)
HPI: Patient is a 45 year old female referred by PCP Dr. Loreen Freud for episodic nausea, vomiting, diarrhea and upper abdominal pain.  A month ago patient patient developed diarrhea after being around coworkers with the same. Diarrhea lasted a couple of days then resolved. Ten days later patient developed recurrent diarrhea in addition to upper abdominal pain. Symptoms lasted about 4 days. Then, approximately 10 days later, last Tuesday, patient developed recurrent nausea, vomiting, upper abdominal pain and diarrhea. She saw PCP on 10/4, referral to Gi made but symptoms got worse she went to ED 10/5. Noncontrast CTscan revealed nonobstructing kidney stone on left. CBC normal. Glucose 200 but nonfasting. Preg test negative. BMs have been back to normal, no further nausea or abdominal pain in 3 days. She stopped all of her home meds except birth control. Patent wonders if Saxenda which she started a few months ago could be causing her GI symptoms. Since being on Saxenda patient has lost approx 25 pounds. No fevers or rashes.   Past Medical History:  Diagnosis Date  . ADD (attention deficit disorder)   . Anxiety   . Depression   . Fibromyalgia   . Migraine   . Unspecified eustachian tube disorder     Past Surgical History:  Procedure Laterality Date  . CESAREAN SECTION    . OTHER SURGICAL HISTORY     preforated uterine surgery  . SINUS EXPLORATION     Family History  Problem Relation Age of Onset  . Other Father     Dyslexia  . Heart disease Father   . Hypertension Father   . Hypertension Mother   . ADD / ADHD Sister    Social History  Substance Use Topics  . Smoking status: Former Smoker    Years: 8.00    Quit date: 05/15/2006  . Smokeless tobacco: Never Used     Comment: 1 cig every 4 months  . Alcohol use 0.6 oz/week    1 Glasses of wine per week   Current Outpatient Prescriptions  Medication Sig Dispense Refill  . Norethindrone-Mestranol (NECON 1/50, 28,) 1-50  MG-MCG tablet Take 1 tablet by mouth daily.    [name] ALPRAZolam (XANAX) 0.5 MG tablet Take 1 tablet (0.5 mg total) by mouth 3 (three) times daily as needed for anxiety. (Patient not taking: Reported on 05/15/2016) 30 tablet 1  . DULoxetine (CYMBALTA) 60 MG capsule Take 1 capsule (60 mg total) by mouth 2 (two) times daily. (Patient not taking: Reported on 05/15/2016) 60 capsule 3  . lisdexamfetamine (VYVANSE) 50 MG capsule Take 1 capsule (50 mg total) by mouth daily. (Patient not taking: Reported on 05/15/2016) 30 capsule 0  . omeprazole (PRILOSEC) 20 MG capsule Take 1 capsule (20 mg total) by mouth daily. (Patient not taking: Reported on 05/15/2016) 30 capsule 3  . ondansetron (ZOFRAN) 4 MG tablet Take 1 tablet (4 mg total) by mouth every 8 (eight) hours as needed for nausea or vomiting. (Patient not taking: Reported on 05/15/2016) 20 tablet 0  . rizatriptan (MAXALT-MLT) 10 MG disintegrating tablet Take 1 tablet (10 mg total) by mouth as needed for migraine. May repeat in 2 hours if needed (Patient not taking: Reported on 05/15/2016) 10 tablet 0   No current facility-administered medications for this visit.    Allergies  Allergen Reactions  . Augmentin [Amoxicillin-Pot Clavulanate]     Severe yeast infections     Review of Systems: Positive for fatigue. All other systems reviewed and negative except where noted in  HPI.    Ct Abdomen Pelvis W Contrast  Result Date: 05/09/2016 CLINICAL DATA:  Abdominal pain, nausea, vomiting and diarrhea for 1 month. EXAM: CT ABDOMEN AND PELVIS WITH CONTRAST TECHNIQUE: Multidetector CT imaging of the abdomen and pelvis was performed using the standard protocol following bolus administration of intravenous contrast. CONTRAST:  100 cc ISOVUE-300 IOPAMIDOL (ISOVUE-300) INJECTION 61% COMPARISON:  None. FINDINGS: LOWER CHEST: Lung bases are clear. Included heart size is normal. No pericardial effusion. HEPATOBILIARY: Liver and gallbladder are normal. PANCREAS: Normal.  SPLEEN: Normal. ADRENALS/URINARY TRACT: Kidneys are orthotopic, demonstrating symmetric enhancement. Punctate LEFT interpolar nephrolithiasis. No hydronephrosis or solid renal masses. The unopacified ureters are normal in course and caliber. Delayed imaging through the kidneys demonstrates symmetric prompt contrast excretion within the proximal urinary collecting system. Urinary bladder is partially distended and unremarkable. Normal adrenal glands. STOMACH/BOWEL: The stomach, small and large bowel are normal in course and caliber without inflammatory changes. Normal appendix. VASCULAR/LYMPHATIC: Aortoiliac vessels are normal in course and caliber, minimal calcific atherosclerosis. No lymphadenopathy by CT size criteria. REPRODUCTIVE: Normal. OTHER: No intraperitoneal free fluid or free air. MUSCULOSKELETAL: Nonacute. Small fat containing umbilical hernia. Moderate L5-S1 degenerative disc resulting in moderate to severe bilateral L5-S1 neural foraminal narrowing. IMPRESSION: No acute intra-abdominal or pelvic process. Punctate nonobstructing LEFT nephrolithiasis. Electronically Signed   By: Awilda Metroourtnay  Bloomer M.D.   On: 05/09/2016 01:32    Physical Exam: Ht 5\' 5"  (1.651 m) Comment: height measured without shoes  Wt 164 lb 8 oz (74.6 kg)   BMI 27.37 kg/m  Constitutional: Pleasant,well-developed, white ffemale in no acute distress. HEENT: Normocephalic and atraumatic. Conjunctivae are normal. No scleral icterus. Neck supple.  Cardiovascular: Normal rate, regular rhythm.  Pulmonary/chest: Effort normal and breath sounds normal. No wheezing, rales or rhonchi. Abdominal: Soft, nondistended, nontender. Bowel sounds active throughout. There are no masses palpable. No hepatomegaly. Extremities: no edema Lymphadenopathy: No cervical adenopathy noted. Neurological: Alert and oriented to person place and time. Skin: Skin is warm and dry. No rashes noted. Psychiatric: Normal mood and affect. Behavior is  normal.   ASSESSMENT AND PLAN:  45 year old female with several week history of episodic nausea, vomiting, diarrhea and upper abdominal pain. All symptoms have resolved since stopping all of her meds three days ago. Patient believes the weight loss medication Harlen LabsSaxanda is responsible for symptoms. Labs and CTscan essentially unremarkable. She looks okay,has no alarm symptoms. A 25 pound weight loss has been intentional on Saxenda. - check TSH -Doubt cymbalta or gabapentin responsible for GI symptoms.   Doubt Vyvanse responsible as patient has been on ADD meds for a long time. She may be right about the Saxenda. Prior to pursuing further GI workup patient will attempt to confirm her suspicion through process of elimination..  She doesn't want to resume gabapentin but will add slowly add remaining meds back, one at a time (with exception of Saxenda) If symptoms recur then meds probably weren't responsible and patient will need further GI workup. She will call in a couple of weeks with a condition update.      Donato Schultz.Lowne Chase, Yvonne R, *

## 2016-05-16 ENCOUNTER — Encounter: Payer: Self-pay | Admitting: *Deleted

## 2016-05-19 NOTE — Progress Notes (Signed)
Initial assessment and plans noted 

## 2016-06-30 ENCOUNTER — Other Ambulatory Visit: Payer: Self-pay | Admitting: Family Medicine

## 2016-07-01 NOTE — Telephone Encounter (Signed)
Saxenda no longer on current medication list. Pt was seen in October by PA, Daphine DeutscherMartin and Saxenda was held at that time due to abdominal pain, nausea and reported symptoms were improving off of medication. Refill request denied to pharmacy with note for pt to contact Provider.

## 2016-07-07 ENCOUNTER — Encounter: Payer: Self-pay | Admitting: Family Medicine

## 2016-07-08 ENCOUNTER — Telehealth: Payer: Self-pay | Admitting: Family Medicine

## 2016-07-08 NOTE — Telephone Encounter (Signed)
Questionaire completed. Awaiting determination within 24 hours.

## 2016-07-08 NOTE — Telephone Encounter (Signed)
See 08/29/15 phone note

## 2016-07-08 NOTE — Telephone Encounter (Signed)
Relation to pt: self Call back number: [phone]  Pharmacy:  Reason for call:  Patient states lisdexamfetamine (VYVANSE) 50 MG capsule requires PA and states pharmacy has reached out serval times, please advise

## 2016-07-08 NOTE — Telephone Encounter (Signed)
Spoke with pt. Advised her I will call her insurance today. She has tried Adderall XR and Straterra in the past. States she maxxed out the dose of adderall and Blase Messstraterra was not effective. Submitted request via covermymeds thru new PBM, [facility] and am waiting for questionaire.

## 2016-07-09 NOTE — Telephone Encounter (Signed)
Received approval for Vyvanse via covermymeds. [facility] and pt.

## 2016-08-28 ENCOUNTER — Other Ambulatory Visit: Payer: Self-pay | Admitting: Physician Assistant

## 2016-08-28 DIAGNOSIS — R1013 Epigastric pain: Secondary | ICD-10-CM

## 2016-08-28 NOTE — Telephone Encounter (Signed)
Will defer further refills of patient's medications to PCP  

## 2016-09-11 ENCOUNTER — Encounter: Payer: Self-pay | Admitting: Family Medicine

## 2016-09-11 ENCOUNTER — Ambulatory Visit (INDEPENDENT_AMBULATORY_CARE_PROVIDER_SITE_OTHER): Payer: BC Managed Care – PPO | Admitting: Family Medicine

## 2016-09-11 VITALS — BP 122/77 | HR 86 | Temp 98.6°F | Resp 16 | Ht 65.0 in | Wt 167.6 lb

## 2016-09-11 DIAGNOSIS — F909 Attention-deficit hyperactivity disorder, unspecified type: Secondary | ICD-10-CM | POA: Diagnosis not present

## 2016-09-11 DIAGNOSIS — M797 Fibromyalgia: Secondary | ICD-10-CM

## 2016-09-11 DIAGNOSIS — F419 Anxiety disorder, unspecified: Secondary | ICD-10-CM

## 2016-09-11 MED ORDER — LISDEXAMFETAMINE DIMESYLATE 50 MG PO CAPS: 50.0000 mg | ORAL_CAPSULE | Freq: Every day | ORAL | 0 refills | Status: DC

## 2016-09-11 MED ORDER — DULOXETINE HCL 60 MG PO CPEP: 60.0000 mg | ORAL_CAPSULE | Freq: Two times a day (BID) | ORAL | 8 refills | Status: DC

## 2016-09-11 MED ORDER — LISDEXAMFETAMINE DIMESYLATE 50 MG PO CAPS
ORAL_CAPSULE | ORAL | 0 refills | Status: DC
Start: 2016-09-11 — End: 2016-12-19

## 2016-09-11 MED ORDER — ALPRAZOLAM 0.5 MG PO TABS: 0.5000 mg | ORAL_TABLET | Freq: Three times a day (TID) | ORAL | 1 refills | Status: DC | PRN

## 2016-09-11 NOTE — Progress Notes (Signed)
Subjective:    Patient ID: Jillian Green, female    DOB: 1970-10-08, 46 y.o.   MRN: [id number]   I acted as a Neurosurgeon for Dr. Jamey Ripa, LPN   Chief Complaint  Patient presents with  . ADHD    HPI  Patient is in today for follow up for ADHD  Past Medical History:  Diagnosis Date  . ADD (attention deficit disorder)   . Anxiety   . Depression   . Fibromyalgia   . Migraine   . Unspecified eustachian tube disorder     Past Surgical History:  Procedure Laterality Date  . CESAREAN SECTION    . OTHER SURGICAL HISTORY     preforated uterine surgery  . SINUS EXPLORATION      Family History  Problem Relation Age of Onset  . Other Father     Dyslexia  . Heart disease Father   . Hypertension Father   . Hypertension Mother   . ADD / ADHD Sister     Social History   Social History  . Marital status: Married    Spouse name: N/A  . Number of children: 1  . Years of education: N/A   Occupational History  . [employer]   Social History Main Topics  . Smoking status: Former Smoker    Years: 8.00    Quit date: 05/15/2006  . Smokeless tobacco: Never Used     Comment: 1 cig every 4 months  . Alcohol use 0.6 oz/week    1 Glasses of wine per week  . Drug use: No  . Sexual activity: Yes    Partners: Male   Other Topics Concern  . Not on file   Social History Narrative   Exercise--  Yes --until she had stress fracture    Outpatient Medications Prior to Visit  Medication Sig Dispense Refill  . ALPRAZolam (XANAX) 0.5 MG tablet Take 1 tablet (0.5 mg total) by mouth 3 (three) times daily as needed for anxiety. 30 tablet 1  . Norethindrone-Mestranol (NECON 1/50, 28,) 1-50 MG-MCG tablet Take 1 tablet by mouth daily.    [name] omeprazole (PRILOSEC) 20 MG capsule TAKE ONE CAPSULE BY MOUTH DAILY 30 capsule 3  . rizatriptan (MAXALT-MLT) 10 MG disintegrating tablet Take 1 tablet (10 mg total) by mouth as needed for migraine. May  repeat in 2 hours if needed 10 tablet 0  . DULoxetine (CYMBALTA) 60 MG capsule Take 1 capsule (60 mg total) by mouth 2 (two) times daily. 60 capsule 3  . lisdexamfetamine (VYVANSE) 50 MG capsule Take 1 capsule (50 mg total) by mouth daily. 30 capsule 0  . ondansetron (ZOFRAN) 4 MG tablet Take 1 tablet (4 mg total) by mouth every 8 (eight) hours as needed for nausea or vomiting. (Patient not taking: Reported on 09/11/2016) 20 tablet 0   No facility-administered medications prior to visit.     Allergies  Allergen Reactions  . Augmentin [Amoxicillin-Pot Clavulanate]     Severe yeast infections    Review of Systems  Constitutional: Negative for fever.  HENT: Negative for congestion.   Eyes: Negative for blurred vision.  Respiratory: Negative for cough.   Cardiovascular: Negative for chest pain and palpitations.  Gastrointestinal: Negative for vomiting.  Musculoskeletal: Negative for back pain.  Skin: Negative for rash.  Neurological: Negative for loss of consciousness and headaches.       Objective:    Physical Exam  Constitutional: She is oriented to person, place, and  time. She appears well-developed and well-nourished. No distress.  HENT:  Head: Normocephalic and atraumatic.  Eyes: Conjunctivae are normal. Pupils are equal, round, and reactive to light.  Neck: Normal range of motion. No thyromegaly present.  Cardiovascular: Normal rate and regular rhythm.   Pulmonary/Chest: Effort normal and breath sounds normal. She has no wheezes.  Abdominal: Soft. Bowel sounds are normal. There is no tenderness.  Musculoskeletal: Normal range of motion. She exhibits no edema or deformity.  Neurological: She is alert and oriented to person, place, and time.  Skin: Skin is warm and dry. She is not diaphoretic.  Psychiatric: She has a normal mood and affect.    BP 122/77 (BP Location: Right Arm, Patient Position: Sitting, Cuff Size: Normal)   Pulse 86   Temp 98.6 F (37 C) (Oral)   Resp  16   Ht 5\' 5"  (1.651 m)   Wt 167 lb 9.6 oz (76 kg)   SpO2 98%   BMI 27.89 kg/m  Wt Readings from Last 3 Encounters:  09/11/16 167 lb 9.6 oz (76 kg)  05/15/16 164 lb 8 oz (74.6 kg)  05/12/16 165 lb (74.8 kg)     Lab Results  Component Value Date   WBC 10.4 05/08/2016   HGB 14.2 05/08/2016   HCT 42.8 05/08/2016   PLT 328 05/08/2016   GLUCOSE 200 (H) 05/12/2016   ALT 39 05/08/2016   AST 25 05/08/2016   NA 139 05/12/2016   K 3.8 05/12/2016   CL 106 05/12/2016   CREATININE 0.68 05/12/2016   BUN 5 (L) 05/12/2016   CO2 24 05/12/2016   TSH 1.63 05/15/2016    Lab Results  Component Value Date   TSH 1.63 05/15/2016   Lab Results  Component Value Date   WBC 10.4 05/08/2016   HGB 14.2 05/08/2016   HCT 42.8 05/08/2016   MCV 95.1 05/08/2016   PLT 328 05/08/2016   Lab Results  Component Value Date   NA 139 05/12/2016   K 3.8 05/12/2016   CO2 24 05/12/2016   GLUCOSE 200 (H) 05/12/2016   BUN 5 (L) 05/12/2016   CREATININE 0.68 05/12/2016   BILITOT 0.3 05/08/2016   ALKPHOS 92 05/08/2016   AST 25 05/08/2016   ALT 39 05/08/2016   PROT 6.5 05/08/2016   ALBUMIN 3.6 05/08/2016   CALCIUM 8.8 05/12/2016   ANIONGAP 8 05/08/2016   GFR 99.95 05/12/2016   No results found for: CHOL No results found for: HDL No results found for: LDLCALC No results found for: TRIG No results found for: CHOLHDL No results found for: [id number]     Assessment & Plan:   Problem List Items Addressed This Visit      Unprioritized   ADD (attention deficit disorder)   Relevant Medications   lisdexamfetamine (VYVANSE) 50 MG capsule   lisdexamfetamine (VYVANSE) 50 MG capsule   Fibromyalgia   Relevant Medications   DULoxetine (CYMBALTA) 60 MG capsule    both stable-- no problems--- refill meds F/u 6 months or sooner prn  I have discontinued Jillian Green's ondansetron. I have also changed her lisdexamfetamine. Additionally, I am having her start on lisdexamfetamine and lisdexamfetamine.  Lastly, I am having her maintain her rizatriptan, ALPRAZolam, Norethindrone-Mestranol, omeprazole, DULoxetine, and lisdexamfetamine.  Meds ordered this encounter  Medications  . lisdexamfetamine (VYVANSE) 50 MG capsule    Sig: Do not until February 2018    Dispense:  30 capsule    Refill:  0    Do not fill until November  2017  . DULoxetine (CYMBALTA) 60 MG capsule    Sig: Take 1 capsule (60 mg total) by mouth 2 (two) times daily.    Dispense:  60 capsule    Refill:  8  . lisdexamfetamine (VYVANSE) 50 MG capsule    Sig: Take 1 capsule (50 mg total) by mouth daily.    Dispense:  30 capsule    Refill:  0    Do not fill until February 2018  . lisdexamfetamine (VYVANSE) 50 MG capsule    Sig: Take 1 capsule (50 mg total) by mouth daily.    Dispense:  30 capsule    Refill:  0    Do not fill until March 2018  . lisdexamfetamine (VYVANSE) 50 MG capsule    Sig: Take 1 capsule (50 mg total) by mouth daily.    Dispense:  30 capsule    Refill:  0    Do not fill until April 2018   Pt here for >15 min with > 50 % time spent face to face discussing ADD and depression/anxiety , fibromyalgia. CMA served as Neurosurgeon during this visit. History, Physical and Plan performed by medical provider. Documentation and orders reviewed and attested to.  Donato Schultz, DO Patient ID: Jillian Green, female   DOB: 05-28-1971, 46 y.o.   MRN: [id number]

## 2016-09-11 NOTE — Progress Notes (Signed)
Pre visit review using our clinic review tool, if applicable. No additional management support is needed unless otherwise documented below in the visit note. 

## 2016-09-11 NOTE — Patient Instructions (Addendum)

## 2016-10-02 ENCOUNTER — Other Ambulatory Visit: Payer: Self-pay | Admitting: Family Medicine

## 2016-10-21 ENCOUNTER — Encounter: Payer: Self-pay | Admitting: Family Medicine

## 2016-10-23 ENCOUNTER — Emergency Department (HOSPITAL_COMMUNITY): Payer: 59

## 2016-10-23 ENCOUNTER — Encounter (HOSPITAL_COMMUNITY): Payer: Self-pay

## 2016-10-23 ENCOUNTER — Telehealth: Payer: Self-pay | Admitting: Family Medicine

## 2016-10-23 ENCOUNTER — Emergency Department (HOSPITAL_COMMUNITY)
Admission: EM | Admit: 2016-10-23 | Discharge: 2016-10-23 | Disposition: A | Discharge status: Home / Self Care | Payer: 59 | Attending: Emergency Medicine | Admitting: Emergency Medicine

## 2016-10-23 DIAGNOSIS — Z79899 Other long term (current) drug therapy: Secondary | ICD-10-CM | POA: Insufficient documentation

## 2016-10-23 DIAGNOSIS — Z87891 Personal history of nicotine dependence: Secondary | ICD-10-CM | POA: Insufficient documentation

## 2016-10-23 DIAGNOSIS — R55 Syncope and collapse: Secondary | ICD-10-CM

## 2016-10-23 DIAGNOSIS — R079 Chest pain, unspecified: Secondary | ICD-10-CM | POA: Diagnosis present

## 2016-10-23 DIAGNOSIS — R0789 Other chest pain: Secondary | ICD-10-CM

## 2016-10-23 LAB — BASIC METABOLIC PANEL
ANION GAP: 12 (ref 5–15)
BUN: 11 mg/dL (ref 6–20)
CALCIUM: 8.8 mg/dL — AB (ref 8.9–10.3)
CO2: 19 mmol/L — ABNORMAL LOW (ref 22–32)
Chloride: 104 mmol/L (ref 101–111)
Creatinine, Ser: 0.69 mg/dL (ref 0.44–1.00)
GFR calc Af Amer: >60 mL/min (ref >60)
Glucose, Bld: 187 mg/dL — ABNORMAL HIGH (ref 65–99)
POTASSIUM: 3.6 mmol/L (ref 3.5–5.1)
SODIUM: 135 mmol/L (ref 135–145)

## 2016-10-23 LAB — I-STAT BETA HCG BLOOD, ED (MC, WL, AP ONLY)

## 2016-10-23 LAB — I-STAT TROPONIN, ED
Troponin i, poc: 0 ng/mL (ref 0.00–0.08)
Troponin i, poc: 0 ng/mL (ref 0.00–0.08)

## 2016-10-23 LAB — CBC
HEMATOCRIT: 40.4 % (ref 36.0–46.0)
Hemoglobin: 13.7 g/dL (ref 12.0–15.0)
MCH: 31.8 pg (ref 26.0–34.0)
MCHC: 33.9 g/dL (ref 30.0–36.0)
MCV: 93.7 fL (ref 78.0–100.0)
PLATELETS: 305 10*3/uL (ref 150–400)
RBC: 4.31 MIL/uL (ref 3.87–5.11)
RDW: 12.5 % (ref 11.5–15.5)
WBC: 8 10*3/uL (ref 4.0–10.5)

## 2016-10-23 LAB — D-DIMER, QUANTITATIVE: D-Dimer, Quant: 0.32 ug/mL-FEU (ref 0.00–0.50)

## 2016-10-23 MED ORDER — SODIUM CHLORIDE 0.9 % IV BOLUS (SEPSIS)
1000.0000 mL | Freq: Once | INTRAVENOUS | Status: AC
Start: 2016-10-23 — End: 2016-10-23
  Administered 2016-10-23: 1000 mL via INTRAVENOUS

## 2016-10-23 NOTE — Telephone Encounter (Signed)
Patient Name: Hyman BibleAMMY GREENAWAL T DOB: [birth year] Initial Comment Caller was at work and sat to eat lunch. She took a sip of Pepsi and had a sharp pain in her abdomen, turned white, sweating, non responsive, bp 134/78, and pulse was normal. Happened about an hour ago and feels fine now. She said she could hear people talking to her but she couldnt speak. Nurse Assessment Nurse: Yetta BarreJones, RN, Miranda Date/Time (Eastern Time): 10/23/2016 12:49:37 PM Confirm and document reason for call. If symptomatic, describe symptoms. ---Caller states about 1 hr ago, she had an episode of pain in her upper left chest, pale, clammy, and decreased responsiveness (she could hear things going on around her but could not respond). The pain last about 10 min and took 20-30 min to recover. Does the patient have any new or worsening symptoms? ---Yes Will a triage be completed? ---Yes Related visit to physician within the last 2 weeks? ---No Does the PT have any chronic conditions? (i.e. diabetes, asthma, etc.) ---No Is the patient pregnant or possibly pregnant? (Ask all females between the ages of 5212-55) ---No Is this a behavioral health or substance abuse call? ---No Guidelines Guideline Title Affirmed Question Affirmed Notes Fainting [1] All other patients AND [2] now alert and feels fine (Exception: SIMPLE FAINT due to stress, pain, prolonged standing, or suddenly standing) Final Disposition User Go to ED Now Yetta BarreJones, RN, Miranda [facility] Disagree/Comply: Comply

## 2016-10-23 NOTE — ED Triage Notes (Signed)
Patient complains of weakness and chest pain with diaphoresis while eating lunch at work today at 1140. Reports that the symptoms lasted about 15 minutes and on assessment all resolved. Skin pink, no pain, alert and oriented. No diaphoresis. No neuro deficts

## 2016-10-23 NOTE — ED Provider Notes (Signed)
[facility]-EMERGENCY DEPT Provider Note   CSN: [id number] Arrival date & time: 10/23/16  1331   By signing my name below, I, Jillian Green, attest that this documentation has been prepared under the direction and in the presence of Rolan Bucco, MD. Electronically Signed: Soijett Green, ED Scribe. 10/23/16. 3:57 PM.  History   Chief Complaint Chief Complaint  Patient presents with  . Chest Pain    HPI Jillian Green is a 46 y.o. female who presents to the Emergency Department complaining of fleeting, stabbing, non-radiating, left-sided CP onset 3 hours ago. She notes that she was at lunch, taking a sip from her soda when the CP began. Pt reports that the CP episode lasted for approximately 1 minute. Pt reports associated lightheadedness, nausea, and diaphoresis and felt like she was going to pass out. She states that her other symptoms lasted approximately 15 minutes and have resolved. Pt has not tried any medications for the relief of her symptoms. She denies LOC, palpitations, vomiting, diarrhea, leg pain, leg swelling, and any other symptoms. Pt states that she is a Engineer, site. She notes that she takes birth control pills, but denies long trips or recent surgeries. Denies PMHx of cardiac issues, but notes that her father had a MI in his 63s. Denies PMHx of HTN or high cholesterol.    The history is provided by the patient and the spouse. No language interpreter was used.    Past Medical History:  Diagnosis Date  . ADD (attention deficit disorder)   . Anxiety   . Depression   . Fibromyalgia   . Migraine   . Unspecified eustachian tube disorder     Patient Active Problem List   Diagnosis Date Noted  . Fibromyalgia 07/26/2015  . Right foot sprain 10/10/2014  . Acute pharyngitis 09/04/2014  . ADD (attention deficit disorder) 09/04/2014  . CTS (carpal tunnel syndrome) 09/23/2013  . Dizzy 09/23/2013  . Headache(784.0) 09/23/2013  . Left tennis elbow 10/19/2012  . Overweight  02/21/2011  . WEIGHT GAIN 07/09/2010  . ANXIETY, SITUATIONAL 01/07/2010  . HIP PAIN, LEFT 03/02/2009  . SINUSITIS- ACUTE-NOS 08/24/2008  . RHINITIS 07/12/2008  . ADD 05/22/2008  . COMMON MIGRAINE 03/26/2007  . DISORDER, EUSTACHIAN TUBE NOS 03/26/2007  . DEPRESSION 08/29/2006    Past Surgical History:  Procedure Laterality Date  . CESAREAN SECTION    . OTHER SURGICAL HISTORY     preforated uterine surgery  . SINUS EXPLORATION      OB History    No data available       Home Medications    Prior to Admission medications   Medication Sig Start Date End Date Taking? Authorizing Provider  ALPRAZolam Prudy Feeler) 0.5 MG tablet Take 1 tablet (0.5 mg total) by mouth 3 (three) times daily as needed for anxiety. 09/11/16   Lelon Perla Chase, DO  DULoxetine (CYMBALTA) 60 MG capsule Take 1 capsule (60 mg total) by mouth 2 (two) times daily. 09/11/16   Lelon Perla Chase, DO  levocetirizine (XYZAL) 5 MG tablet TAKE 1 TABLET BY MOUTH EVERY EVENING 10/02/16   Lelon Perla Chase, DO  lisdexamfetamine (VYVANSE) 50 MG capsule Do not until February 2018 09/11/16   Lelon Perla Chase, DO  lisdexamfetamine (VYVANSE) 50 MG capsule Take 1 capsule (50 mg total) by mouth daily. 09/11/16   Lelon Perla Chase, DO  lisdexamfetamine (VYVANSE) 50 MG capsule Take 1 capsule (50 mg total) by mouth daily. 09/11/16   Donato Schultz,  DO  lisdexamfetamine (VYVANSE) 50 MG capsule Take 1 capsule (50 mg total) by mouth daily. 09/11/16   Lelon Perla Chase, DO  Norethindrone-Mestranol (NECON 1/50, 28,) 1-50 MG-MCG tablet Take 1 tablet by mouth daily.    Historical Provider, MD  omeprazole (PRILOSEC) 20 MG capsule TAKE ONE CAPSULE BY MOUTH DAILY 08/28/16   Lelon Perla Chase, DO  rizatriptan (MAXALT-MLT) 10 MG disintegrating tablet Take 1 tablet (10 mg total) by mouth as needed for migraine. May repeat in 2 hours if needed 04/10/15   Donato Schultz, DO    Family History Family History  Problem Relation Age of  Onset  . Other Father     Dyslexia  . Heart disease Father   . Hypertension Father   . Hypertension Mother   . ADD / ADHD Sister     Social History Social History  Substance Use Topics  . Smoking status: Former Smoker    Years: 8.00    Quit date: 05/15/2006  . Smokeless tobacco: Never Used     Comment: 1 cig every 4 months  . Alcohol use 0.6 oz/week    1 Glasses of wine per week     Allergies   Augmentin [amoxicillin-pot clavulanate]   Review of Systems Review of Systems  Constitutional: Positive for diaphoresis. Negative for chills, fatigue and fever.  HENT: Negative for congestion, rhinorrhea and sneezing.   Eyes: Negative.   Respiratory: Negative for cough, chest tightness and shortness of breath.   Cardiovascular: Positive for chest pain (left sided). Negative for palpitations and leg swelling.  Gastrointestinal: Positive for nausea. Negative for abdominal pain, blood in stool, diarrhea and vomiting.  Genitourinary: Negative for difficulty urinating, flank pain, frequency and hematuria.  Musculoskeletal: Negative for back pain.  Skin: Negative for rash and wound.  Neurological: Positive for light-headedness. Negative for dizziness, syncope, speech difficulty, weakness, numbness and headaches.     Physical Exam Updated Vital Signs BP 135/85   Pulse 96   Temp 97.8 F (36.6 C) (Oral)   Resp (!) 22   SpO2 99%   Physical Exam  Constitutional: She is oriented to person, place, and time. She appears well-developed and well-nourished.  HENT:  Head: Normocephalic and atraumatic.  Eyes: Pupils are equal, round, and reactive to light.  Neck: Normal range of motion. Neck supple.  Cardiovascular: Normal rate, regular rhythm and normal heart sounds.   Pulmonary/Chest: Effort normal and breath sounds normal. No respiratory distress. She has no wheezes. She has no rales. She exhibits no tenderness.  Abdominal: Soft. Bowel sounds are normal. There is no tenderness. There  is no rebound and no guarding.  Musculoskeletal: Normal range of motion. She exhibits no edema.  No edema or calf tenderness.   Lymphadenopathy:    She has no cervical adenopathy.  Neurological: She is alert and oriented to person, place, and time.  Skin: Skin is warm and dry. No rash noted.  Psychiatric: She has a normal mood and affect.     ED Treatments / Results  DIAGNOSTIC STUDIES: Oxygen Saturation is 100% on RA, nl by my interpretation.    COORDINATION OF CARE: 3:55 PM Discussed treatment plan with pt at bedside which includes EKG, labs, CXR and pt agreed to plan.   Labs (all labs ordered are listed, but only abnormal results are displayed) Labs Reviewed  BASIC METABOLIC PANEL - Abnormal; Notable for the following:       Result Value   CO2 19 (*)  Glucose, Bld 187 (*)    Calcium 8.8 (*)    All other components within normal limits  CBC  D-DIMER, QUANTITATIVE (NOT AT [facility])  I-STAT TROPOININ, ED  I-STAT BETA HCG BLOOD, ED ([facility], [facility], [facility] ONLY)  I-STAT TROPOININ, ED    EKG  EKG Interpretation  Date/Time:  Thursday October 23 2016 16:00:23 EDT Ventricular Rate:  98 PR Interval:  132 QRS Duration: 70 QT Interval:  387 QTC Calculation: 495 R Axis:   81 Text Interpretation:  Sinus rhythm Borderline T wave abnormalities Borderline prolonged QT interval Confirmed by Sohum Delillo  MD, Brizeida Mcmurry ([id number]) on 10/23/2016 4:21:07 PM       Radiology Dg Chest 2 View  Result Date: 10/23/2016 CLINICAL DATA:  Weakness and chest pain.  Diaphoresis EXAM: CHEST  2 VIEW COMPARISON:  None. FINDINGS: The heart size and mediastinal contours are within normal limits. Both lungs are clear. The visualized skeletal structures are unremarkable. IMPRESSION: No active cardiopulmonary disease. Electronically Signed   By: Signa Kellaylor  Stroud M.D.   On: 10/23/2016 14:09    Procedures Procedures (including critical care time)  Medications Ordered in ED Medications  sodium chloride 0.9 % bolus 1,000 mL (0  mLs Intravenous Stopped 10/23/16 1746)     Initial Impression / Assessment and Plan / ED Course  I have reviewed the triage vital signs and the nursing notes.  Pertinent labs & imaging results that were available during my care of the patient were reviewed by me and considered in my medical decision making (see chart for details).     Patient presents with a brief episode of chest pain associated with a near syncopal episode. She is pain-free now feels back to baseline. Her EKG doesn't show any ischemic changes. There is no suggestions of pulmonary embolus. She was given IV fluids and her tachycardia has normalized. She is ambulating without symptoms. She's had no further episodes of chest pain.  She has a HEART score of 2.  I encouraged her to have close follow-up with her PCP. Return precautions were given.  Final Clinical Impressions(s) / ED Diagnoses   Final diagnoses:  Atypical chest pain  Near syncope    New Prescriptions New Prescriptions   No medications on file   I personally performed the services described in this documentation, which was scribed in my presence.  The recorded information has been reviewed and considered.     Rolan BuccoMelanie Salar Molden, MD 10/23/16 [phone]

## 2016-10-23 NOTE — ED Notes (Signed)
ED Provider at bedside. 

## 2016-10-23 NOTE — ED Notes (Addendum)
Pt reports she took a sip of Pepsi and had a strong left sided chest pain and could hear people speaking around her but could not respond. Pt also reports that she was pale per bystanders. She states the whole episode lasted maybe 15 minutes.

## 2016-10-23 NOTE — ED Notes (Signed)
Per EDP, ambulated pt in hallway. Pt ambulated with steady gait. Denies feeling dizzy or faint.

## 2016-10-24 ENCOUNTER — Telehealth: Payer: Self-pay

## 2016-10-24 NOTE — Telephone Encounter (Signed)
PA initiated via Covermymeds; KEY: [id number]. Received real-time PA approval. PA approved through 10/24/2019. Notice of approval sent for scanning.

## 2016-11-24 ENCOUNTER — Encounter: Payer: Self-pay | Admitting: Family Medicine

## 2016-11-25 ENCOUNTER — Telehealth: Payer: Self-pay

## 2016-11-25 ENCOUNTER — Other Ambulatory Visit: Payer: Self-pay | Admitting: Family Medicine

## 2016-11-25 MED ORDER — NALTREXONE-BUPROPION HCL ER 8-90 MG PO TB12: ORAL_TABLET | ORAL | 2 refills | Status: DC

## 2016-11-25 NOTE — Telephone Encounter (Signed)
PA initiated via Covermymeds; KEY: [id number]. Received real-time response. PA approved 10/26/2016 through 03/25/2017. [facility] informed of PA approval. PA notification sent for scanning.

## 2016-11-25 NOTE — Telephone Encounter (Signed)
Ok to try again-- contrave 2 po bid - #120   2 refills  Start with 1 a day for 1 week then bid for 1 week then 1 in am and 2 in pm for 1 week then 2 bid

## 2016-12-19 ENCOUNTER — Other Ambulatory Visit: Payer: Self-pay | Admitting: Family Medicine

## 2016-12-19 DIAGNOSIS — F909 Attention-deficit hyperactivity disorder, unspecified type: Secondary | ICD-10-CM

## 2016-12-22 MED ORDER — LISDEXAMFETAMINE DIMESYLATE 50 MG PO CAPS: 50.0000 mg | ORAL_CAPSULE | Freq: Every day | ORAL | 0 refills | Status: DC

## 2016-12-22 MED ORDER — LISDEXAMFETAMINE DIMESYLATE 50 MG PO CAPS: ORAL_CAPSULE | ORAL | 0 refills | Status: DC

## 2016-12-24 ENCOUNTER — Other Ambulatory Visit: Payer: Self-pay

## 2016-12-24 ENCOUNTER — Other Ambulatory Visit: Payer: Self-pay | Admitting: *Deleted

## 2016-12-24 DIAGNOSIS — M797 Fibromyalgia: Secondary | ICD-10-CM

## 2016-12-24 DIAGNOSIS — R1013 Epigastric pain: Secondary | ICD-10-CM

## 2016-12-24 MED ORDER — DULOXETINE HCL 60 MG PO CPEP: 60.0000 mg | ORAL_CAPSULE | Freq: Two times a day (BID) | ORAL | 1 refills | Status: DC

## 2016-12-24 MED ORDER — OMEPRAZOLE 20 MG PO CPDR: 20.0000 mg | DELAYED_RELEASE_CAPSULE | Freq: Every day | ORAL | 3 refills | Status: DC

## 2016-12-25 ENCOUNTER — Other Ambulatory Visit: Payer: Self-pay | Admitting: Family Medicine

## 2016-12-25 DIAGNOSIS — F909 Attention-deficit hyperactivity disorder, unspecified type: Secondary | ICD-10-CM

## 2016-12-25 MED ORDER — LISDEXAMFETAMINE DIMESYLATE 50 MG PO CAPS
50.0000 mg | ORAL_CAPSULE | Freq: Every day | ORAL | 0 refills | Status: DC
Start: 2016-12-25 — End: 2017-06-18

## 2016-12-25 MED ORDER — LISDEXAMFETAMINE DIMESYLATE 50 MG PO CAPS: 50.0000 mg | ORAL_CAPSULE | Freq: Every day | ORAL | 0 refills | Status: DC

## 2016-12-26 ENCOUNTER — Other Ambulatory Visit: Payer: Self-pay | Admitting: *Deleted

## 2016-12-26 ENCOUNTER — Telehealth: Payer: Self-pay | Admitting: Family Medicine

## 2016-12-26 DIAGNOSIS — R1013 Epigastric pain: Secondary | ICD-10-CM

## 2016-12-26 MED ORDER — OMEPRAZOLE 20 MG PO CPDR: 20.0000 mg | DELAYED_RELEASE_CAPSULE | Freq: Every day | ORAL | 0 refills | Status: DC

## 2016-12-26 NOTE — Telephone Encounter (Signed)
Pt brought Vyvanse script back and exchanged it for new script as physician and nurse requested.

## 2017-01-02 ENCOUNTER — Telehealth: Payer: Self-pay

## 2017-01-02 LAB — HM PAP SMEAR: HM PAP: NORMAL

## 2017-01-02 NOTE — Telephone Encounter (Signed)
PA initiated via Covermymeds; KEY: [id number]. Received real-time approval. 12/03/2016 through 01/02/2020. [facility] informed of PA approval.

## 2017-01-05 ENCOUNTER — Encounter: Payer: Self-pay | Admitting: Family Medicine

## 2017-01-05 NOTE — Telephone Encounter (Signed)
Should be seen--- either by us or her eye dr

## 2017-01-06 ENCOUNTER — Encounter: Payer: Self-pay | Admitting: Family Medicine

## 2017-01-06 ENCOUNTER — Other Ambulatory Visit: Payer: Self-pay | Admitting: Family Medicine

## 2017-01-06 MED ORDER — AZELASTINE HCL 0.05 % OP SOLN: 1.0000 [drp] | Freq: Two times a day (BID) | OPHTHALMIC | 0 refills | Status: DC

## 2017-01-06 NOTE — Telephone Encounter (Signed)
optivar 1 gtt bid  1 bottle Can get her an appointment in next day or two

## 2017-01-08 ENCOUNTER — Ambulatory Visit (INDEPENDENT_AMBULATORY_CARE_PROVIDER_SITE_OTHER): Payer: 59 | Admitting: Family Medicine

## 2017-01-08 VITALS — BP 108/70 | HR 100 | Temp 98.1°F | Ht 65.0 in | Wt 171.0 lb

## 2017-01-08 DIAGNOSIS — F419 Anxiety disorder, unspecified: Secondary | ICD-10-CM | POA: Diagnosis not present

## 2017-01-08 DIAGNOSIS — H1013 Acute atopic conjunctivitis, bilateral: Secondary | ICD-10-CM | POA: Diagnosis not present

## 2017-01-08 MED ORDER — ALPRAZOLAM 0.5 MG PO TABS: 0.5000 mg | ORAL_TABLET | Freq: Three times a day (TID) | ORAL | 1 refills | Status: DC | PRN

## 2017-01-08 NOTE — Progress Notes (Signed)
Chief Complaint  Patient presents with  . Eye Problem    itchy-(B)-had drops called in and they have helped.    Jillian Green is here for bilateral eye irritation.  Duration: 3 days Chemical exposure? No  Recent URI? No  Contact lenses? No  History of allergies? No  Treatment to date: Pt was recently rx'd Optivar and states it has helped. She continues to use Xyzal and Flonase. Denies vision changes, eye pain, or drainage.  ROS:  Eyes: As noted above  Past Medical History:  Diagnosis Date  . ADD (attention deficit disorder)   . Anxiety   . Depression   . Fibromyalgia   . Migraine   . Unspecified eustachian tube disorder    Family History  Problem Relation Age of Onset  . Other Father        Dyslexia  . Heart disease Father   . Hypertension Father   . Hypertension Mother   . ADD / ADHD Sister     BP 108/70 (BP Location: Left Arm, Patient Position: Sitting, Cuff Size: Normal)   Pulse 100   Temp 98.1 F (36.7 C) (Oral)   Ht 5\' 5"  (1.651 m)   Wt 171 lb (77.6 kg)   LMP 12/18/2016 (Approximate)   SpO2 98%   BMI 28.46 kg/m  Gen: Awake, alert, appears stated age Eyes: Lids nml, Sclera white, PERRLA, EOMi Nose: Nares patent without discharge Mouth: MMM, pharynx without erythema or exudate Psych: Age appropriate judgment and insight; mood and affect normal  Allergic conjunctivitis of both eyes  Anxiety - Plan: ALPRAZolam (XANAX) 0.5 MG tablet  Orders as above. Artificial tears also recommended. Refill Xanax on behalf of reg PCP. F/u as originally scheduled with reg PCP. Pt voiced understanding and agreement to the plan.  Jillian Rocheicholas Paul WheelingWendling, DO 01/08/17 4:53 PM

## 2017-01-08 NOTE — Patient Instructions (Signed)
OK to use artificial tears (typically people use it every 3-4 hours) for lubrication.  Use Optivar for 7-10 days. If still requiring, use for a longer period of time.

## 2017-01-31 ENCOUNTER — Other Ambulatory Visit: Payer: Self-pay | Admitting: Family Medicine

## 2017-02-23 ENCOUNTER — Other Ambulatory Visit: Payer: Self-pay | Admitting: Family Medicine

## 2017-02-23 ENCOUNTER — Encounter: Payer: Self-pay | Admitting: Family Medicine

## 2017-02-23 ENCOUNTER — Ambulatory Visit (INDEPENDENT_AMBULATORY_CARE_PROVIDER_SITE_OTHER): Payer: 59 | Admitting: Family Medicine

## 2017-02-23 VITALS — BP 118/82 | HR 93 | Temp 97.8°F | Resp 16 | Ht 65.0 in | Wt 173.6 lb

## 2017-02-23 DIAGNOSIS — E663 Overweight: Secondary | ICD-10-CM

## 2017-02-23 DIAGNOSIS — F419 Anxiety disorder, unspecified: Secondary | ICD-10-CM | POA: Diagnosis not present

## 2017-02-23 DIAGNOSIS — F909 Attention-deficit hyperactivity disorder, unspecified type: Secondary | ICD-10-CM | POA: Diagnosis not present

## 2017-02-23 MED ORDER — ALPRAZOLAM 0.5 MG PO TABS: 0.5000 mg | ORAL_TABLET | Freq: Three times a day (TID) | ORAL | 1 refills | Status: DC | PRN

## 2017-02-23 MED ORDER — LISDEXAMFETAMINE DIMESYLATE 50 MG PO CAPS: 50.0000 mg | ORAL_CAPSULE | Freq: Every day | ORAL | 0 refills | Status: DC

## 2017-02-23 MED ORDER — NALTREXONE-BUPROPION HCL ER 8-90 MG PO TB12: ORAL_TABLET | ORAL | 2 refills | Status: DC

## 2017-02-23 NOTE — Progress Notes (Signed)
Patient ID: Jillian Green, female   DOB: 02/29/1971, 46 y.o.   MRN: [id number]     Subjective:  I acted as a Neurosurgeonscribe for Dr. Zola ButtonLowne-Chase.  [name], CMA   Patient ID: Jillian Green, female    DOB: 02/29/1971, 46 y.o.   MRN: [id number]  Chief Complaint  Patient presents with  . Anxiety  . ADD  . Weight Loss    HPI  Patient is in today for anxiety, add, and weight loss.  She has been taking the xanax more than usual.  She has not lost any weight with the contrave.  She has been doing well with Vyvanse.  Patient Care Team: Zola ButtonLowne Chase, Grayling CongressYvonne R, DO as PCP - General   Past Medical History:  Diagnosis Date  . ADD (attention deficit disorder)   . Anxiety   . Depression   . Fibromyalgia   . Migraine   . Unspecified eustachian tube disorder     Past Surgical History:  Procedure Laterality Date  . CESAREAN SECTION    . OTHER SURGICAL HISTORY     preforated uterine surgery  . SINUS EXPLORATION      Family History  Problem Relation Age of Onset  . Other Father        Dyslexia  . Heart disease Father   . Hypertension Father   . Hypertension Mother   . ADD / ADHD Sister     Social History   Social History  . Marital status: Married    Spouse name: N/A  . Number of children: 1  . Years of education: N/A   Occupational History  [place] [employer]   Social History Main Topics  . Smoking status: Former Smoker    Years: 8.00    Quit date: 05/15/2006  . Smokeless tobacco: Never Used     Comment: 1 cig every 4 months  . Alcohol use 0.6 oz/week    1 Glasses of wine per week  . Drug use: No  . Sexual activity: Yes    Partners: Male   Other Topics Concern  . Not on file   Social History Narrative   Exercise--  Yes --until she had stress fracture    Outpatient Medications Prior to Visit  Medication Sig Dispense Refill  . azelastine (OPTIVAR) 0.05 % ophthalmic solution INSTILL 1 DROP INTO BOTH EYES TWICE A DAY 6 mL 0  .  DULoxetine (CYMBALTA) 60 MG capsule Take 1 capsule (60 mg total) by mouth 2 (two) times daily. 180 capsule 1  . levocetirizine (XYZAL) 5 MG tablet TAKE 1 TABLET BY MOUTH EVERY EVENING 90 tablet 1  . lisdexamfetamine (VYVANSE) 50 MG capsule Take 1 capsule (50 mg total) by mouth daily. 30 capsule 0  . omeprazole (PRILOSEC) 20 MG capsule Take 1 capsule (20 mg total) by mouth daily. 90 capsule 0  . rizatriptan (MAXALT-MLT) 10 MG disintegrating tablet Take 1 tablet (10 mg total) by mouth as needed for migraine. May repeat in 2 hours if needed 10 tablet 0  . ALPRAZolam (XANAX) 0.5 MG tablet Take 1 tablet (0.5 mg total) by mouth 3 (three) times daily as needed for anxiety. 30 tablet 1  . lisdexamfetamine (VYVANSE) 50 MG capsule Take 1 capsule (50 mg total) by mouth daily. 30 capsule 0  . lisdexamfetamine (VYVANSE) 50 MG capsule Take 1 capsule (50 mg total) by mouth daily. 30 capsule 0  . lisdexamfetamine (VYVANSE) 50 MG capsule Take 1 capsule (50 mg total) by  mouth daily. 30 capsule 0  . Naltrexone-Bupropion HCl ER (CONTRAVE) 8-90 MG TB12 Start with 1 a day for 1 week then bid for 1 week then 1 in am and 2 in pm for 1 week then 2 bid 120 tablet 2  . Norethindrone-Mestranol (NECON 1/50, 28,) 1-50 MG-MCG tablet Take 1 tablet by mouth daily.     No facility-administered medications prior to visit.     Allergies  Allergen Reactions  . Augmentin [Amoxicillin-Pot Clavulanate]     Severe yeast infections    Review of Systems  Constitutional: Negative for fever and malaise/fatigue.  HENT: Negative for congestion.   Eyes: Negative for blurred vision.  Respiratory: Negative for cough and shortness of breath.   Cardiovascular: Negative for chest pain, palpitations and leg swelling.  Gastrointestinal: Negative for vomiting.  Musculoskeletal: Negative for back pain.  Skin: Negative for rash.  Neurological: Negative for loss of consciousness and headaches.  Psychiatric/Behavioral: The patient is  nervous/anxious.        Objective:    Physical Exam  Constitutional: She is oriented to person, place, and time. She appears well-developed and well-nourished.  HENT:  Head: Normocephalic and atraumatic.  Eyes: Conjunctivae and EOM are normal.  Neck: Normal range of motion. Neck supple. No JVD present. Carotid bruit is not present. No thyromegaly present.  Cardiovascular: Normal rate, regular rhythm and normal heart sounds.   No murmur heard. Pulmonary/Chest: Effort normal and breath sounds normal. No respiratory distress. She has no wheezes. She has no rales. She exhibits no tenderness.  Musculoskeletal: She exhibits no edema.  Neurological: She is alert and oriented to person, place, and time.  Psychiatric: Her speech is normal and behavior is normal. Judgment and thought content normal. Her mood appears anxious. Her affect is angry. Cognition and memory are normal. She exhibits a depressed mood.  Nursing note and vitals reviewed.   BP 118/82 (BP Location: Right Arm, Cuff Size: Normal)   Pulse 93   Temp 97.8 F (36.6 C) (Oral)   Resp 16   Ht 5\' 5"  (1.651 m)   Wt 173 lb 9.6 oz (78.7 kg)   SpO2 97%   BMI 28.89 kg/m  Wt Readings from Last 3 Encounters:  02/23/17 173 lb 9.6 oz (78.7 kg)  01/08/17 171 lb (77.6 kg)  09/11/16 167 lb 9.6 oz (76 kg)   BP Readings from Last 3 Encounters:  02/23/17 118/82  01/08/17 108/70  10/23/16 135/85     Immunization History  Administered Date(s) Administered  . Hep A / Hep B 02/13/2012  . PPD Test 02/13/2012, 04/01/2016  . Tdap 02/13/2012    Health Maintenance  Topic Date Due  . HIV Screening  06/20/1987  . PAP SMEAR  06/15/2014  . INFLUENZA VACCINE  03/04/2017  . TETANUS/TDAP  02/12/2022    Lab Results  Component Value Date   WBC 8.0 10/23/2016   HGB 13.7 10/23/2016   HCT 40.4 10/23/2016   PLT 305 10/23/2016   GLUCOSE 187 (H) 10/23/2016   ALT 39 05/08/2016   AST 25 05/08/2016   NA 135 10/23/2016   K 3.6 10/23/2016   CL  104 10/23/2016   CREATININE 0.69 10/23/2016   BUN 11 10/23/2016   CO2 19 (L) 10/23/2016   TSH 1.63 05/15/2016    Lab Results  Component Value Date   TSH 1.63 05/15/2016   Lab Results  Component Value Date   WBC 8.0 10/23/2016   HGB 13.7 10/23/2016   HCT 40.4 10/23/2016  MCV 93.7 10/23/2016   PLT 305 10/23/2016   Lab Results  Component Value Date   NA 135 10/23/2016   K 3.6 10/23/2016   CO2 19 (L) 10/23/2016   GLUCOSE 187 (H) 10/23/2016   BUN 11 10/23/2016   CREATININE 0.69 10/23/2016   BILITOT 0.3 05/08/2016   ALKPHOS 92 05/08/2016   AST 25 05/08/2016   ALT 39 05/08/2016   PROT 6.5 05/08/2016   ALBUMIN 3.6 05/08/2016   CALCIUM 8.8 (L) 10/23/2016   ANIONGAP 12 10/23/2016   GFR 99.95 05/12/2016   No results found for: CHOL No results found for: HDL No results found for: LDLCALC No results found for: TRIG No results found for: CHOLHDL No results found for: JXBJ4N       Assessment & Plan:   Problem List Items Addressed This Visit      Unprioritized   ADD (attention deficit disorder)    con't vyvanse No change      Relevant Medications   lisdexamfetamine (VYVANSE) 50 MG capsule   lisdexamfetamine (VYVANSE) 50 MG capsule   lisdexamfetamine (VYVANSE) 50 MG capsule   Overweight - Primary    con't contrave Pt given info for healthy weight loss and wellness program  con't exercise      Relevant Medications   Naltrexone-Bupropion HCl ER (CONTRAVE) 8-90 MG TB12    Other Visit Diagnoses    Anxiety       Relevant Medications   ALPRAZolam (XANAX) 0.5 MG tablet      I have discontinued Ms. Breault's Norethindrone-Mestranol. I am also having her maintain her rizatriptan, levocetirizine, DULoxetine, lisdexamfetamine, omeprazole, azelastine, norethindrone-ethinyl estradiol, Naltrexone-Bupropion HCl ER, lisdexamfetamine, lisdexamfetamine, lisdexamfetamine, and ALPRAZolam.  Meds ordered this encounter  Medications  . norethindrone-ethinyl estradiol  (JUNEL 1/20) 1-20 MG-MCG tablet    Sig: Take 1 tablet by mouth daily. Takes continuously  . Naltrexone-Bupropion HCl ER (CONTRAVE) 8-90 MG TB12    Sig: Start with 1 a day for 1 week then bid for 1 week then 1 in am and 2 in pm for 1 week then 2 bid    Dispense:  120 tablet    Refill:  2  . lisdexamfetamine (VYVANSE) 50 MG capsule    Sig: Take 1 capsule (50 mg total) by mouth daily.    Dispense:  30 capsule    Refill:  0  . lisdexamfetamine (VYVANSE) 50 MG capsule    Sig: Take 1 capsule (50 mg total) by mouth daily.    Dispense:  30 capsule    Refill:  0    Do not fill until September 2018  . lisdexamfetamine (VYVANSE) 50 MG capsule    Sig: Take 1 capsule (50 mg total) by mouth daily.    Dispense:  30 capsule    Refill:  0    Do not fill until Oct 2018  . ALPRAZolam (XANAX) 0.5 MG tablet    Sig: Take 1 tablet (0.5 mg total) by mouth 3 (three) times daily as needed for anxiety.    Dispense:  45 tablet    Refill:  1    CMA served as scribe during this visit. History, Physical and Plan performed by medical provider. Documentation and orders reviewed and attested to.  Donato Schultz, DO

## 2017-02-23 NOTE — Patient Instructions (Signed)

## 2017-02-25 NOTE — Assessment & Plan Note (Signed)
con't contrave Pt given info for healthy weight loss and wellness program  con't exercise

## 2017-02-25 NOTE — Assessment & Plan Note (Signed)
con't vyvanse No change

## 2017-03-06 ENCOUNTER — Encounter: Payer: Self-pay | Admitting: Family Medicine

## 2017-03-20 ENCOUNTER — Encounter: Payer: Self-pay | Admitting: Family Medicine

## 2017-03-20 ENCOUNTER — Ambulatory Visit (INDEPENDENT_AMBULATORY_CARE_PROVIDER_SITE_OTHER): Payer: 59 | Admitting: Family Medicine

## 2017-03-20 VITALS — BP 118/82 | HR 98 | Temp 98.0°F | Ht 65.5 in | Wt 175.0 lb

## 2017-03-20 DIAGNOSIS — E663 Overweight: Secondary | ICD-10-CM

## 2017-03-20 NOTE — Patient Instructions (Signed)

## 2017-03-20 NOTE — Progress Notes (Signed)
Patient ID: Jillian Green, female    DOB: [birth year]  Age: 46 y.o. MRN: [id number]    Subjective:  Subjective  HPI Jillian Green presents to discuss weight loss.  She has been going to [place] and wanted to put her on phenteramine but not with the Vyvanse.   She is reluctant to come off vyvanse because it works so well.  She has not called about the healthy wellness  She wanted to discuss the belviq and whether she should come of vyvanse to use phenteramine instead.   Review of Systems  Constitutional: Negative for activity change, appetite change, fatigue and unexpected weight change.  Respiratory: Negative for cough and shortness of breath.   Cardiovascular: Negative for chest pain and palpitations.  Psychiatric/Behavioral: Negative for behavioral problems and dysphoric mood. The patient is not nervous/anxious.     History Past Medical History:  Diagnosis Date  . ADD (attention deficit disorder)   . Anxiety   . Depression   . Fibromyalgia   . Migraine   . Unspecified eustachian tube disorder     She has a past surgical history that includes Sinus exploration; Cesarean section; and OTHER SURGICAL HISTORY.   Her family history includes ADD / ADHD in her sister; Heart disease in her father; Hypertension in her father and mother; Other in her father.She reports that she quit smoking about 10 years ago. She quit after 8.00 years of use. She has never used smokeless tobacco. She reports that she drinks about 0.6 oz of alcohol per week . She reports that she does not use drugs.  Current Outpatient Prescriptions on File Prior to Visit  Medication Sig Dispense Refill  . ALPRAZolam (XANAX) 0.5 MG tablet Take 1 tablet (0.5 mg total) by mouth 3 (three) times daily as needed for anxiety. 45 tablet 1  . azelastine (OPTIVAR) 0.05 % ophthalmic solution INSTILL 1 DROP INTO BOTH EYES TWICE A DAY 6 mL 0  . DULoxetine (CYMBALTA) 60 MG capsule Take 1 capsule (60 mg total) by mouth 2 (two)  times daily. 180 capsule 1  . levocetirizine (XYZAL) 5 MG tablet TAKE 1 TABLET BY MOUTH EVERY EVENING 90 tablet 1  . lisdexamfetamine (VYVANSE) 50 MG capsule Take 1 capsule (50 mg total) by mouth daily. 30 capsule 0  . lisdexamfetamine (VYVANSE) 50 MG capsule Take 1 capsule (50 mg total) by mouth daily. 30 capsule 0  . lisdexamfetamine (VYVANSE) 50 MG capsule Take 1 capsule (50 mg total) by mouth daily. 30 capsule 0  . lisdexamfetamine (VYVANSE) 50 MG capsule Take 1 capsule (50 mg total) by mouth daily. 30 capsule 0  . norethindrone-ethinyl estradiol (JUNEL 1/20) 1-20 MG-MCG tablet Take 1 tablet by mouth daily. Takes continuously    . omeprazole (PRILOSEC) 20 MG capsule Take 1 capsule (20 mg total) by mouth daily. 90 capsule 0  . rizatriptan (MAXALT-MLT) 10 MG disintegrating tablet Take 1 tablet (10 mg total) by mouth as needed for migraine. May repeat in 2 hours if needed 10 tablet 0   No current facility-administered medications on file prior to visit.      Objective:  Objective  Physical Exam  Constitutional: She is oriented to person, place, and time. She appears well-developed and well-nourished.  HENT:  Head: Normocephalic and atraumatic.  Eyes: Conjunctivae and EOM are normal.  Neck: Normal range of motion. Neck supple. No JVD present. Carotid bruit is not present. No thyromegaly present.  Cardiovascular: Normal rate, regular rhythm and normal heart sounds.   No  murmur heard. Pulmonary/Chest: Effort normal and breath sounds normal. No respiratory distress. She has no wheezes. She has no rales. She exhibits no tenderness.  Musculoskeletal: She exhibits no edema.  Neurological: She is alert and oriented to person, place, and time.  Psychiatric: She has a normal mood and affect. Her behavior is normal. Judgment and thought content normal.  Nursing note and vitals reviewed.  BP 118/82 (BP Location: Left Arm, Patient Position: Sitting, Cuff Size: Normal)   Pulse 98   Temp 98 F (36.7  C) (Oral)   Ht 5' 5.5" (1.664 m)   Wt 175 lb (79.4 kg)   SpO2 97%   BMI 28.68 kg/m  Wt Readings from Last 3 Encounters:  03/20/17 175 lb (79.4 kg)  02/23/17 173 lb 9.6 oz (78.7 kg)  01/08/17 171 lb (77.6 kg)     Lab Results  Component Value Date   WBC 8.0 10/23/2016   HGB 13.7 10/23/2016   HCT 40.4 10/23/2016   PLT 305 10/23/2016   GLUCOSE 187 (H) 10/23/2016   ALT 39 05/08/2016   AST 25 05/08/2016   NA 135 10/23/2016   K 3.6 10/23/2016   CL 104 10/23/2016   CREATININE 0.69 10/23/2016   BUN 11 10/23/2016   CO2 19 (L) 10/23/2016   TSH 1.63 05/15/2016    Dg Chest 2 View  Result Date: 10/23/2016 CLINICAL DATA:  Weakness and chest pain.  Diaphoresis EXAM: CHEST  2 VIEW COMPARISON:  None. FINDINGS: The heart size and mediastinal contours are within normal limits. Both lungs are clear. The visualized skeletal structures are unremarkable. IMPRESSION: No active cardiopulmonary disease. Electronically Signed   By: Signa Kell M.D.   On: 10/23/2016 14:09     Assessment & Plan:  Plan  I have discontinued Jillian Green's Naltrexone-Bupropion HCl ER. I am also having her maintain her rizatriptan, levocetirizine, DULoxetine, lisdexamfetamine, omeprazole, azelastine, norethindrone-ethinyl estradiol, lisdexamfetamine, lisdexamfetamine, lisdexamfetamine, ALPRAZolam, and Lorcaserin HCl.  Meds ordered this encounter  Medications  . Lorcaserin HCl (BELVIQ) 10 MG TABS    Sig: 1 po bid    Problem List Items Addressed This Visit      Unprioritized   Overweight - Primary   Relevant Medications   Lorcaserin HCl (BELVIQ) 10 MG TABS     she will use the belviq rx by [facility] and call about health[facility] weight and wellness program  Follow-up: Return in about 6 months (around 09/20/2017), or if symptoms worsen or fail to improve.  Donato Schultz, DO

## 2017-03-30 ENCOUNTER — Other Ambulatory Visit: Payer: Self-pay | Admitting: Family Medicine

## 2017-04-13 ENCOUNTER — Encounter: Payer: Self-pay | Admitting: Family Medicine

## 2017-04-13 DIAGNOSIS — M797 Fibromyalgia: Secondary | ICD-10-CM

## 2017-04-14 MED ORDER — DULOXETINE HCL 60 MG PO CPEP: 60.0000 mg | ORAL_CAPSULE | Freq: Two times a day (BID) | ORAL | 1 refills | Status: DC

## 2017-05-04 ENCOUNTER — Encounter: Payer: Self-pay | Admitting: Family

## 2017-05-04 ENCOUNTER — Ambulatory Visit (INDEPENDENT_AMBULATORY_CARE_PROVIDER_SITE_OTHER): Payer: BC Managed Care – PPO | Admitting: Family

## 2017-05-04 VITALS — BP 125/81 | HR 94 | Temp 97.8°F | Resp 18 | Ht 65.0 in | Wt 174.2 lb

## 2017-05-04 DIAGNOSIS — R0981 Nasal congestion: Secondary | ICD-10-CM | POA: Diagnosis not present

## 2017-05-04 MED ORDER — DOXYCYCLINE HYCLATE 100 MG PO TABS: 100.0000 mg | ORAL_TABLET | Freq: Two times a day (BID) | ORAL | 0 refills | Status: DC

## 2017-05-04 NOTE — Progress Notes (Signed)
Subjective:    Patient ID: [name], female    DOB: 10-Jun-1971, 46 y.o.   MRN: [id number]  HPI  Ms. Shela Nevin is a 46 yr old female who presents today with chief complaint of cough.  Reports associated stuffy.  Has tried mucinex with mild improvement.  Works as a Runner, broadcasting/film/video.    Review of Systems   See HPI  Past Medical History:  Diagnosis Date  . ADD (attention deficit disorder)   . Anxiety   . Depression   . Fibromyalgia   . Migraine   . Unspecified eustachian tube disorder      Social History   Social History  . Marital status: Married    Spouse name: N/A  . Number of children: 1  . Years of education: N/A   Occupational History  . [employer]   Social History Main Topics  . Smoking status: Former Smoker    Years: 8.00    Quit date: 05/15/2006  . Smokeless tobacco: Never Used     Comment: 1 cig every 4 months  . Alcohol use 0.6 oz/week    1 Glasses of wine per week  . Drug use: No  . Sexual activity: Yes    Partners: Male   Other Topics Concern  . Not on file   Social History Narrative   Exercise--  Yes --until she had stress fracture    Past Surgical History:  Procedure Laterality Date  . CESAREAN SECTION    . OTHER SURGICAL HISTORY     preforated uterine surgery  . SINUS EXPLORATION      Family History  Problem Relation Age of Onset  . Other Father        Dyslexia  . Heart disease Father   . Hypertension Father   . Hypertension Mother   . ADD / ADHD Sister     Allergies  Allergen Reactions  . Augmentin [Amoxicillin-Pot Clavulanate]     Severe yeast infections    Current Outpatient Prescriptions on File Prior to Visit  Medication Sig Dispense Refill  . ALPRAZolam (XANAX) 0.5 MG tablet Take 1 tablet (0.5 mg total) by mouth 3 (three) times daily as needed for anxiety. 45 tablet 1  . azelastine (OPTIVAR) 0.05 % ophthalmic solution Place 1 drop into both eyes 2 (two) times daily as needed. 6 mL 3    . DULoxetine (CYMBALTA) 60 MG capsule Take 1 capsule (60 mg total) by mouth 2 (two) times daily. 180 capsule 1  . levocetirizine (XYZAL) 5 MG tablet TAKE 1 TABLET BY MOUTH EVERY EVENING 90 tablet 1  . lisdexamfetamine (VYVANSE) 50 MG capsule Take 1 capsule (50 mg total) by mouth daily. 30 capsule 0  . lisdexamfetamine (VYVANSE) 50 MG capsule Take 1 capsule (50 mg total) by mouth daily. 30 capsule 0  . lisdexamfetamine (VYVANSE) 50 MG capsule Take 1 capsule (50 mg total) by mouth daily. 30 capsule 0  . lisdexamfetamine (VYVANSE) 50 MG capsule Take 1 capsule (50 mg total) by mouth daily. 30 capsule 0  . Lorcaserin HCl (BELVIQ) 10 MG TABS 1 po bid    . norethindrone-ethinyl estradiol (JUNEL 1/20) 1-20 MG-MCG tablet Take 1 tablet by mouth daily. Takes continuously    . omeprazole (PRILOSEC) 20 MG capsule Take 1 capsule (20 mg total) by mouth daily. 90 capsule 0  . rizatriptan (MAXALT-MLT) 10 MG disintegrating tablet Take 1 tablet (10 mg total) by mouth as needed for migraine. May repeat in 2 hours  if needed 10 tablet 0   No current facility-administered medications on file prior to visit.     BP 125/81 (BP Location: Right Arm, Cuff Size: Large)   Pulse 94   Temp 97.8 F (36.6 C) (Oral)   Resp 18   Ht  (1.651 m)   Wt 174 lb 3.2 oz (79 kg)   SpO2 98%   BMI 28.99 kg/m        Objective:   Physical Exam  Constitutional: She is oriented to person, place, and time. She appears well-developed and well-nourished.  HENT:  Head: Normocephalic and atraumatic.  Right Ear: Tympanic membrane and ear canal normal.  Left Ear: Tympanic membrane and ear canal normal.  Mouth/Throat: Posterior oropharyngeal erythema present. No oropharyngeal exudate or posterior oropharyngeal edema.  Cardiovascular: Normal rate, regular rhythm and normal heart sounds.   No murmur heard. Pulmonary/Chest: Effort normal and breath sounds normal. No respiratory distress. She has no wheezes.  Musculoskeletal: She  exhibits no edema.  Neurological: She is alert and oriented to person, place, and time.  Psychiatric: She has a normal mood and affect. Her behavior is normal. Judgment and thought content normal.          Assessment & Plan:  Sinus congestion-at this point I think she is still in the viral stage. Patient states that she has history of severe sinusitis and previous sinus surgery. Reports that she is prone to recurrent severe sinus infections if she does not start antibiotics early. I advised her that I do not see indication for her beginning antibodies at this time we did discuss supportive measures as follows:  Please begin claritin or zyrtec once daily. Add mucinex-D for congestion. Continue flonase 2 sprays each nostril once daily. Irrigate sinuses twice daily with nasal saline spray.  If symptoms are not improved in 3 days you may begin doxycyline. If symptoms improve please discard prescription. Please call with questions/concerns.

## 2017-05-04 NOTE — Patient Instructions (Addendum)
Please begin claritin or zyrtec once daily. Add mucinex-D for congestion. Continue flonase 2 sprays each nostril once daily. Irrigate sinuses twice daily with nasal saline spray.  If symptoms are not improved in 3 days you may begin doxycyline. If symptoms improve please discard prescription. Please call with questions/concerns.

## 2017-05-22 ENCOUNTER — Ambulatory Visit (INDEPENDENT_AMBULATORY_CARE_PROVIDER_SITE_OTHER): Payer: BC Managed Care – PPO | Admitting: Family Medicine

## 2017-05-22 ENCOUNTER — Encounter: Payer: Self-pay | Admitting: Family Medicine

## 2017-05-22 VITALS — BP 110/80 | HR 110 | Temp 98.3°F | Ht 65.0 in | Wt 172.0 lb

## 2017-05-22 DIAGNOSIS — H1013 Acute atopic conjunctivitis, bilateral: Secondary | ICD-10-CM | POA: Diagnosis not present

## 2017-05-22 NOTE — Progress Notes (Signed)
Chief Complaint  Patient presents with  . Conjunctivitis    Jillian Green is here for bilateral eye irritation.  Duration: This AM Chemical exposure? No  Recent URI? Yes  Contact lenses? No  History of allergies? Yes  Treatment to date: antihist drops  ROS:  Eyes: As noted above  Past Medical History:  Diagnosis Date  . ADD (attention deficit disorder)   . Anxiety   . Depression   . Fibromyalgia   . Migraine   . Unspecified eustachian tube disorder    Family History  Problem Relation Age of Onset  . Other Father        Dyslexia  . Heart disease Father   . Hypertension Father   . Hypertension Mother   . ADD / ADHD Sister     BP 110/80 (BP Location: Left Arm, Patient Position: Sitting, Cuff Size: Normal)   Pulse (!) 110   Temp 98.3 F (36.8 C) (Oral)   Ht 5\' 5"  (1.651 m)   Wt 172 lb (78 kg)   SpO2 98%   BMI 28.62 kg/m  Gen: Awake, alert, appears stated age Eyes: Lids neg, Sclera white, PERRLA, EOMi, no TTP to light palpation of globes with lids closed Nose: Nares patent without discharge Mouth: MMM, pharynx without erythema or exudate Psych: Age appropriate judgment and insight; mood and affect normal  Allergic conjunctivitis of both eyes  Orders as above. [facility]  Instructed to practice good hand hygiene and try not to touch face. Warm compresses and artificial tears also recommended. F/u if no improvement in 7-10 days. Pt voiced understanding and agreement to the plan.  [name], DO 05/22/17 2:41 PM

## 2017-05-22 NOTE — Patient Instructions (Addendum)
Artificial tears like Refresh and Systane may be used for comfort. Generally people use them every 2-4 hours, but you can use them as much as you want.  Let us know if you need anything.

## 2017-05-22 NOTE — Progress Notes (Signed)
Pre visit review using our clinic review tool, if applicable. No additional management support is needed unless otherwise documented below in the visit note. 

## 2017-06-17 ENCOUNTER — Other Ambulatory Visit: Payer: Self-pay | Admitting: Family Medicine

## 2017-06-17 DIAGNOSIS — F909 Attention-deficit hyperactivity disorder, unspecified type: Secondary | ICD-10-CM

## 2017-06-18 MED ORDER — LISDEXAMFETAMINE DIMESYLATE 50 MG PO CAPS: 50.0000 mg | ORAL_CAPSULE | Freq: Every day | ORAL | 0 refills | Status: DC

## 2017-06-18 NOTE — Telephone Encounter (Signed)
Pt informed that Rx's have been placed at front desk for pick up at her convenience.  

## 2017-06-18 NOTE — Telephone Encounter (Signed)
Pt is requesting refill on Vyvanse 50mg .  Last OV: 05/22/2017 w/ [facility] Last Fill: 02/23/2017 #30 and 0RF (For August, September and October) UDS: 02/23/2017 Low risk  [place]CR printed; placed on ledge.  Please advise.

## 2017-06-18 NOTE — Telephone Encounter (Signed)
Rx's printed and November, December 2018, and January 2019, awaiting DO signature.

## 2017-06-18 NOTE — Telephone Encounter (Signed)
Refill 3 months

## 2017-07-10 ENCOUNTER — Telehealth: Payer: Self-pay | Admitting: Family Medicine

## 2017-07-10 NOTE — Telephone Encounter (Signed)
Pt did not pick up their Rx for VYANSE dated 12/22/2016. Rx was shredded.

## 2017-09-21 ENCOUNTER — Encounter: Payer: Self-pay | Admitting: Family Medicine

## 2017-09-21 ENCOUNTER — Ambulatory Visit: Payer: BC Managed Care – PPO | Admitting: Family Medicine

## 2017-09-21 VITALS — BP 126/80 | HR 102 | Temp 98.8°F | Resp 16 | Ht 65.0 in | Wt 172.0 lb

## 2017-09-21 DIAGNOSIS — Z79899 Other long term (current) drug therapy: Secondary | ICD-10-CM

## 2017-09-21 DIAGNOSIS — F988 Other specified behavioral and emotional disorders with onset usually occurring in childhood and adolescence: Secondary | ICD-10-CM

## 2017-09-21 DIAGNOSIS — F909 Attention-deficit hyperactivity disorder, unspecified type: Secondary | ICD-10-CM

## 2017-09-21 DIAGNOSIS — J029 Acute pharyngitis, unspecified: Secondary | ICD-10-CM | POA: Diagnosis not present

## 2017-09-21 DIAGNOSIS — R05 Cough: Secondary | ICD-10-CM

## 2017-09-21 DIAGNOSIS — R059 Cough, unspecified: Secondary | ICD-10-CM

## 2017-09-21 LAB — POCT INFLUENZA A/B
Influenza A, POC: NEGATIVE
Influenza B, POC: NEGATIVE

## 2017-09-21 LAB — POCT RAPID STREP A (OFFICE): Rapid Strep A Screen: NEGATIVE

## 2017-09-21 MED ORDER — LISDEXAMFETAMINE DIMESYLATE 50 MG PO CAPS: 50.0000 mg | ORAL_CAPSULE | Freq: Every day | ORAL | 0 refills | Status: DC

## 2017-09-21 MED ORDER — CEFUROXIME AXETIL 500 MG PO TABS: 500.0000 mg | ORAL_TABLET | Freq: Two times a day (BID) | ORAL | 0 refills | Status: DC

## 2017-09-21 NOTE — Progress Notes (Signed)
Patient ID: Jillian Green, female    DOB: Feb 21, 1971  Age: 47 y.o. MRN: [id number]    Subjective:  Subjective  HPI Jillian Green presents for sore throat and bodyaches -- 3-4 days   Pt took Ibuprofen this am.   No fever that she knows of.  + runny nose, pnd.    Review of Systems  Constitutional: Positive for chills. Negative for fever.  HENT: Positive for congestion, postnasal drip, rhinorrhea and sore throat. Negative for sinus pressure.   Respiratory: Positive for cough, chest tightness, shortness of breath and wheezing.   Cardiovascular: Negative for chest pain, palpitations and leg swelling.  Musculoskeletal: Positive for myalgias. Negative for neck pain and neck stiffness.  Allergic/Immunologic: Negative for environmental allergies.    History Past Medical History:  Diagnosis Date  . ADD (attention deficit disorder)   . Anxiety   . Depression   . Fibromyalgia   . Migraine   . Unspecified eustachian tube disorder     She has a past surgical history that includes Sinus exploration; Cesarean section; and OTHER SURGICAL HISTORY.   Her family history includes ADD / ADHD in her sister; Heart disease in her father; Hypertension in her father and mother; Other in her father.She reports that she quit smoking about 11 years ago. She quit after 8.00 years of use. she has never used smokeless tobacco. She reports that she drinks about 0.6 oz of alcohol per week. She reports that she does not use drugs.  Current Outpatient Medications on File Prior to Visit  Medication Sig Dispense Refill  . ALPRAZolam (XANAX) 0.5 MG tablet Take 1 tablet (0.5 mg total) by mouth 3 (three) times daily as needed for anxiety. 45 tablet 1  . azelastine (OPTIVAR) 0.05 % ophthalmic solution Place 1 drop into both eyes 2 (two) times daily as needed. 6 mL 3  . DULoxetine (CYMBALTA) 60 MG capsule Take 1 capsule (60 mg total) by mouth 2 (two) times daily. 180 capsule 1  . levocetirizine (XYZAL) 5 MG  tablet TAKE 1 TABLET BY MOUTH EVERY EVENING 90 tablet 1  . Lorcaserin HCl (BELVIQ) 10 MG TABS 1 po bid    . norethindrone-ethinyl estradiol (JUNEL 1/20) 1-20 MG-MCG tablet Take 1 tablet by mouth daily. Takes continuously    . omeprazole (PRILOSEC) 20 MG capsule Take 1 capsule (20 mg total) by mouth daily. 90 capsule 0  . rizatriptan (MAXALT-MLT) 10 MG disintegrating tablet Take 1 tablet (10 mg total) by mouth as needed for migraine. May repeat in 2 hours if needed 10 tablet 0  . SAXENDA 18 MG/3ML SOPN INJECT 0.6MG X7DAYS, 1.2MG X7DAYS, 1.8MG X7DAYS, 2.4MG X7DAYS, 3MG  DAILY (PA SENT MULTIPLE TIMES)  0   No current facility-administered medications on file prior to visit.      Objective:  Objective  Physical Exam  Constitutional: She is oriented to person, place, and time. She appears well-developed and well-nourished.  HENT:  Right Ear: External ear normal.  Left Ear: External ear normal.  + PND + errythema  Eyes: Conjunctivae are normal. Right eye exhibits no discharge. Left eye exhibits no discharge.  Neck: Normal range of motion. Neck supple.  Cardiovascular: Normal rate, regular rhythm and normal heart sounds.  No murmur heard. Pulmonary/Chest: Effort normal and breath sounds normal. No respiratory distress. She has no wheezes. She has no rales. She exhibits no tenderness.  Musculoskeletal: She exhibits no edema.  Lymphadenopathy:    She has cervical adenopathy.  Neurological: She is alert and oriented to person, place, and  time.  Nursing note and vitals reviewed.  BP 126/80 (BP Location: Left Arm, Cuff Size: Normal)   Pulse (!) 102   Temp 98.8 F (37.1 C) (Oral)   Resp 16   Ht 5\' 5"  (1.651 m)   Wt 172 lb (78 kg)   SpO2 98%   BMI 28.62 kg/m  Wt Readings from Last 3 Encounters:  09/21/17 172 lb (78 kg)  05/22/17 172 lb (78 kg)  05/04/17 174 lb 3.2 oz (79 kg)     Lab Results  Component Value Date   WBC 8.0 10/23/2016   HGB 13.7 10/23/2016   HCT 40.4 10/23/2016   PLT  305 10/23/2016   GLUCOSE 187 (H) 10/23/2016   ALT 39 05/08/2016   AST 25 05/08/2016   NA 135 10/23/2016   K 3.6 10/23/2016   CL 104 10/23/2016   CREATININE 0.69 10/23/2016   BUN 11 10/23/2016   CO2 19 (L) 10/23/2016   TSH 1.63 05/15/2016    Dg Chest 2 View  Result Date: 10/23/2016 CLINICAL DATA:  Weakness and chest pain.  Diaphoresis EXAM: CHEST  2 VIEW COMPARISON:  None. FINDINGS: The heart size and mediastinal contours are within normal limits. Both lungs are clear. The visualized skeletal structures are unremarkable. IMPRESSION: No active cardiopulmonary disease. Electronically Signed   By: Signa Kell M.D.   On: 10/23/2016 14:09     Assessment & Plan:  Plan  I have discontinued Nylan L. Turi's doxycycline. I am also having her start on cefUROXime. Additionally, I am having her maintain her rizatriptan, levocetirizine, omeprazole, norethindrone-ethinyl estradiol, ALPRAZolam, Lorcaserin HCl, azelastine, DULoxetine, SAXENDA, lisdexamfetamine, lisdexamfetamine, and lisdexamfetamine.  Meds ordered this encounter  Medications  . DISCONTD: lisdexamfetamine (VYVANSE) 50 MG capsule    Sig: Take 1 capsule (50 mg total) by mouth daily.    Dispense:  30 capsule    Refill:  0  . DISCONTD: lisdexamfetamine (VYVANSE) 50 MG capsule    Sig: Take 1 capsule (50 mg total) by mouth daily.    Dispense:  30 capsule    Refill:  0    For March 2019  . DISCONTD: lisdexamfetamine (VYVANSE) 50 MG capsule    Sig: Take 1 capsule (50 mg total) by mouth daily.    Dispense:  30 capsule    Refill:  0    For April 2019  . lisdexamfetamine (VYVANSE) 50 MG capsule    Sig: Take 1 capsule (50 mg total) by mouth daily.    Dispense:  30 capsule    Refill:  0  . lisdexamfetamine (VYVANSE) 50 MG capsule    Sig: Take 1 capsule (50 mg total) by mouth daily.    Dispense:  30 capsule    Refill:  0    For March 2019  . lisdexamfetamine (VYVANSE) 50 MG capsule    Sig: Take 1 capsule (50 mg total) by  mouth daily.    Dispense:  30 capsule    Refill:  0    For April 2019  . cefUROXime (CEFTIN) 500 MG tablet    Sig: Take 1 tablet (500 mg total) by mouth 2 (two) times daily with a meal.    Dispense:  20 tablet    Refill:  0    Problem List Items Addressed This Visit      Unprioritized   ADD (attention deficit disorder) - Primary    Stable con't meds      Relevant Medications   lisdexamfetamine (VYVANSE) 50 MG capsule   lisdexamfetamine (  VYVANSE) 50 MG capsule   lisdexamfetamine (VYVANSE) 50 MG capsule   Other Relevant Orders   Pain Mgmt, Profile 8 w/Conf, U    Other Visit Diagnoses    High risk medication use       Relevant Orders   Pain Mgmt, Profile 8 w/Conf, U   Sore throat       Relevant Orders   POCT rapid strep A (Completed)   Culture, Group A Strep   Cough       Relevant Orders   POCT Influenza A/B (Completed)   Pharyngitis, unspecified etiology       Relevant Medications   cefUROXime (CEFTIN) 500 MG tablet      Follow-up: Return if symptoms worsen or fail to improve.  Donato SchultzYvonne R Lowne Chase, DO

## 2017-09-21 NOTE — Assessment & Plan Note (Signed)
Stable con't meds 

## 2017-09-21 NOTE — Patient Instructions (Signed)

## 2017-09-23 LAB — PAIN MGMT, PROFILE 8 W/CONF, U
6 Acetylmorphine: NEGATIVE ng/mL (ref <10)
ALCOHOL METABOLITES: NEGATIVE ng/mL (ref <500)
Amphetamine: 796 ng/mL — ABNORMAL HIGH (ref <250)
Amphetamines: POSITIVE ng/mL — AB (ref <500)
BENZODIAZEPINES: NEGATIVE ng/mL (ref <100)
Buprenorphine, Urine: NEGATIVE ng/mL (ref <5)
COCAINE METABOLITE: NEGATIVE ng/mL (ref <150)
Creatinine: 47.2 mg/dL
MARIJUANA METABOLITE: NEGATIVE ng/mL (ref <20)
MDMA: NEGATIVE ng/mL (ref <500)
METHAMPHETAMINE: NEGATIVE ng/mL (ref <250)
Opiates: NEGATIVE ng/mL (ref <100)
Oxidant: NEGATIVE ug/mL (ref <200)
Oxycodone: NEGATIVE ng/mL (ref <100)
pH: 7.06 (ref 4.5–9.0)

## 2017-09-23 LAB — CULTURE, GROUP A STREP
MICRO NUMBER: 90212391
SPECIMEN QUALITY: ADEQUATE

## 2017-10-06 ENCOUNTER — Encounter: Payer: Self-pay | Admitting: Family Medicine

## 2017-10-06 MED ORDER — BUPROPION HCL ER (XL) 150 MG PO TB24: 150.0000 mg | ORAL_TABLET | Freq: Every day | ORAL | 2 refills | Status: DC

## 2017-10-06 NOTE — Telephone Encounter (Signed)
Can try to add wellbutrin xl 150 mg #30 1 po qd 2 refills Ov in 1 month

## 2017-10-28 ENCOUNTER — Other Ambulatory Visit: Payer: Self-pay | Admitting: Family Medicine

## 2017-10-28 DIAGNOSIS — F909 Attention-deficit hyperactivity disorder, unspecified type: Secondary | ICD-10-CM

## 2017-10-28 NOTE — Telephone Encounter (Signed)
Last Vyvanse RX: 09/21/17, #30 Last OV: 09/21/17 Next OV: none scheduled UDS: 09/21/17, low risk; due 03/21/18 CSC: 09/21/17 CSR: No discrepancies identified

## 2017-10-29 MED ORDER — LISDEXAMFETAMINE DIMESYLATE 50 MG PO CAPS: 50.0000 mg | ORAL_CAPSULE | Freq: Every day | ORAL | 0 refills | Status: DC

## 2017-11-24 ENCOUNTER — Encounter: Payer: Self-pay | Admitting: Family Medicine

## 2017-11-24 ENCOUNTER — Other Ambulatory Visit: Payer: Self-pay | Admitting: Family Medicine

## 2017-11-24 DIAGNOSIS — F909 Attention-deficit hyperactivity disorder, unspecified type: Secondary | ICD-10-CM

## 2017-11-24 NOTE — Telephone Encounter (Signed)
Usually 150 mg is loading dose-- if you feel like it is helping enough ok to stay on it

## 2017-11-30 ENCOUNTER — Other Ambulatory Visit: Payer: Self-pay | Admitting: Family Medicine

## 2017-11-30 DIAGNOSIS — F909 Attention-deficit hyperactivity disorder, unspecified type: Secondary | ICD-10-CM

## 2017-12-01 MED ORDER — LISDEXAMFETAMINE DIMESYLATE 50 MG PO CAPS: 50.0000 mg | ORAL_CAPSULE | Freq: Every day | ORAL | 0 refills | Status: DC

## 2017-12-01 NOTE — Telephone Encounter (Signed)
See my chart message

## 2017-12-01 NOTE — Telephone Encounter (Signed)
Requesting:Vyvanse Contract:09/21/17 UDS:09/21/17 low risk Last Visit:09/21/17 Next Visit: none with pcp Last Refill:10/29/17 Data base ran and is on desk  Please Advise

## 2017-12-03 ENCOUNTER — Encounter: Payer: Self-pay | Admitting: Family Medicine

## 2017-12-03 DIAGNOSIS — S99929S Unspecified injury of unspecified foot, sequela: Secondary | ICD-10-CM

## 2017-12-03 NOTE — Telephone Encounter (Signed)
We can refer her to ortho

## 2017-12-07 ENCOUNTER — Encounter (INDEPENDENT_AMBULATORY_CARE_PROVIDER_SITE_OTHER): Payer: Self-pay | Admitting: Orthopaedic Surgery

## 2017-12-07 ENCOUNTER — Ambulatory Visit (INDEPENDENT_AMBULATORY_CARE_PROVIDER_SITE_OTHER): Payer: BC Managed Care – PPO

## 2017-12-07 ENCOUNTER — Ambulatory Visit (INDEPENDENT_AMBULATORY_CARE_PROVIDER_SITE_OTHER): Payer: BC Managed Care – PPO | Admitting: Orthopaedic Surgery

## 2017-12-07 DIAGNOSIS — M79671 Pain in right foot: Secondary | ICD-10-CM | POA: Diagnosis not present

## 2017-12-07 NOTE — Progress Notes (Signed)
Office Visit Note   Patient: Jillian Green           Date of Birth: 11-28-1970           MRN: [id number] Visit Date: 12/07/2017              Requested by: [address] [place], [place] [address] [place], [place] [address] PCP: Zola Button, Grayling Congress, DO   Assessment & Plan: Visit Diagnoses:  1. Right foot pain     Plan: We will buddy tape the first and second toe together.  I recommended a stiff soled shoe to wear for the next 4 weeks.  She will follow-up with [place] at that point for recheck.  Call with concerns or questions in the meantime.  Follow-Up Instructions: Return in about 1 month (around 01/04/2018).   Orders:  Orders Placed This Encounter  Procedures  . XR Foot Complete Right   No orders of the defined types were placed in this encounter.     Procedures: No procedures performed   Clinical Data: No additional findings.   Subjective: Chief Complaint  Patient presents with  . Right Foot - Injury  . 2nd Toe    HPI Jillian Green is a pleasant 47 year old female presents to our clinic today with concerns about her right second toe.  She notes that since January of this year, she has jammed her second toe at least twice.  She has had pain and somewhat of a deformity which is causing blisters to the medial side.  She comes in today for evaluation and treatment recommendations. Review of Systems as detailed in HPI.  All others reviewed and are negative.   Objective: Vital Signs: There were no vitals taken for this visit.  Physical Exam well-developed well-nourished female no acute distress.  Alert and oriented x3.  Ortho Exam examination of her right second toe reveals minimal swelling.  She does have a little bit of a deformity with her second toe pointing towards the third.  Full range of motion.  Minimal tenderness.  She is neurovascularly intact distally.  Specialty Comments:  No specialty comments available.  Imaging: Xr Foot Complete  Right  Result Date: 12/07/2017 X-rays of the right foot are negative for acute findings    PMFS History: Patient Active Problem List   Diagnosis Date Noted  . Right foot pain 12/07/2017  . Fibromyalgia 07/26/2015  . Right foot sprain 10/10/2014  . Acute pharyngitis 09/04/2014  . ADD (attention deficit disorder) 09/04/2014  . CTS (carpal tunnel syndrome) 09/23/2013  . Dizzy 09/23/2013  . Headache(784.0) 09/23/2013  . Left tennis elbow 10/19/2012  . Overweight 02/21/2011  . WEIGHT GAIN 07/09/2010  . ANXIETY, SITUATIONAL 01/07/2010  . HIP PAIN, LEFT 03/02/2009  . SINUSITIS- ACUTE-NOS 08/24/2008  . RHINITIS 07/12/2008  . ADD 05/22/2008  . COMMON MIGRAINE 03/26/2007  . DISORDER, EUSTACHIAN TUBE NOS 03/26/2007  . DEPRESSION 08/29/2006   Past Medical History:  Diagnosis Date  . ADD (attention deficit disorder)   . Anxiety   . Depression   . Fibromyalgia   . Migraine   . Unspecified eustachian tube disorder     Family History  Problem Relation Age of Onset  . Other Father        Dyslexia  . Heart disease Father   . Hypertension Father   . Hypertension Mother   . ADD / ADHD Sister     Past Surgical History:  Procedure Laterality Date  . CESAREAN  SECTION    . OTHER SURGICAL HISTORY     preforated uterine surgery  . SINUS EXPLORATION     Social History   Occupational History  . Occupation: middle Corporate investment banker: [employer]  Tobacco Use  . Smoking status: Former Smoker    Years: 8.00    Last attempt to quit: 05/15/2006    Years since quitting: 11.5  . Smokeless tobacco: Never Used  . Tobacco comment: 1 cig every 4 months  Substance and Sexual Activity  . Alcohol use: Yes    Alcohol/week: 0.6 oz    Types: 1 Glasses of wine per week  . Drug use: No  . Sexual activity: Yes    Partners: Male

## 2017-12-27 ENCOUNTER — Other Ambulatory Visit: Payer: Self-pay | Admitting: Family Medicine

## 2017-12-29 NOTE — Telephone Encounter (Signed)
Received refill request for buPROPion (WELLBUTRIN XL) 150 mg 24 hr tablet. Last office visit 09/21/17 and  last refill 10/06/17. Pt's refill sent to pharmacy.

## 2018-01-02 ENCOUNTER — Encounter: Payer: Self-pay | Admitting: Family Medicine

## 2018-01-05 ENCOUNTER — Other Ambulatory Visit: Payer: Self-pay | Admitting: Family Medicine

## 2018-01-05 DIAGNOSIS — F909 Attention-deficit hyperactivity disorder, unspecified type: Secondary | ICD-10-CM

## 2018-01-05 MED ORDER — LISDEXAMFETAMINE DIMESYLATE 50 MG PO CAPS: 50.0000 mg | ORAL_CAPSULE | Freq: Every day | ORAL | 0 refills | Status: DC

## 2018-01-05 NOTE — Telephone Encounter (Signed)
Requesting:Vyvanse Contract:09/21/17 UDS:09/21/17 low risk Last Visit:09/21/17 Next Visit:none with pcp Last Refill:12/01/17  No discrepancies  Please Advise

## 2018-01-06 ENCOUNTER — Ambulatory Visit (INDEPENDENT_AMBULATORY_CARE_PROVIDER_SITE_OTHER): Payer: Self-pay | Admitting: Orthopaedic Surgery

## 2018-01-07 ENCOUNTER — Encounter: Payer: Self-pay | Admitting: Family Medicine

## 2018-01-07 ENCOUNTER — Other Ambulatory Visit: Payer: Self-pay | Admitting: Family Medicine

## 2018-01-07 DIAGNOSIS — M797 Fibromyalgia: Secondary | ICD-10-CM

## 2018-01-07 NOTE — Telephone Encounter (Signed)
Did she spell med correctly?  I have not heard of it.

## 2018-01-08 NOTE — Telephone Encounter (Signed)
Its spelled right.  It pretty new.

## 2018-01-19 ENCOUNTER — Ambulatory Visit (INDEPENDENT_AMBULATORY_CARE_PROVIDER_SITE_OTHER): Payer: Self-pay | Admitting: Orthopaedic Surgery

## 2018-01-19 ENCOUNTER — Other Ambulatory Visit: Payer: Self-pay | Admitting: Family Medicine

## 2018-01-20 ENCOUNTER — Other Ambulatory Visit: Payer: Self-pay | Admitting: Family Medicine

## 2018-02-05 ENCOUNTER — Other Ambulatory Visit: Payer: Self-pay | Admitting: Family Medicine

## 2018-02-05 DIAGNOSIS — F909 Attention-deficit hyperactivity disorder, unspecified type: Secondary | ICD-10-CM

## 2018-02-08 ENCOUNTER — Encounter: Payer: Self-pay | Admitting: Family Medicine

## 2018-02-08 NOTE — Telephone Encounter (Signed)
Pt called and the pt was given the message that was left by Dr. Zola ButtonLowne-Chase

## 2018-02-08 NOTE — Telephone Encounter (Signed)
Author phoned pt. to relay Dr. Ernst SpellLowne's message that vynase, xanax, and phentermine cannot be prescribed together, so vynase rx has been denied. No answer, left VM to call back #[phone]

## 2018-02-08 NOTE — Telephone Encounter (Signed)
Pt has been scheduled.  °

## 2018-02-08 NOTE — Telephone Encounter (Signed)
Requesting: vynase 50mg  qday Contract: 09/21/17 UDS: 09/21/17 Last OV: 09/21/17 Next Ov: N/A Last refill: 01/05/18 #30, 0RF Database: discrepancy found. Report printed for Dr. Laury AxonLowne review.   Please advise.

## 2018-02-08 NOTE — Telephone Encounter (Signed)
-----   Message from Jillian SchultzYvonne R Lowne Chase, DO sent at 02/08/2018  2:25 PM EDT ----- Pt can not take vyvanse , xanax and phenteramine --- they can not be prescribed together .

## 2018-02-08 NOTE — Telephone Encounter (Signed)
Author phoned pt. to relay Dr. Ernst SpellLowne's concern in taking phentermine and vyvanse, but pt. not available. VM left for pt. To call back #[phone]. Appointment scheduled for 7/12 with Dr. Zola ButtonLowne-Chase.

## 2018-02-09 NOTE — Telephone Encounter (Signed)
Will d/w pt on Friday.

## 2018-02-12 ENCOUNTER — Ambulatory Visit: Payer: BC Managed Care – PPO | Admitting: Family Medicine

## 2018-02-12 ENCOUNTER — Encounter: Payer: Self-pay | Admitting: Family Medicine

## 2018-02-12 VITALS — BP 121/78 | HR 102 | Temp 98.4°F | Resp 16 | Ht 65.0 in | Wt 178.4 lb

## 2018-02-12 DIAGNOSIS — G43009 Migraine without aura, not intractable, without status migrainosus: Secondary | ICD-10-CM | POA: Diagnosis not present

## 2018-02-12 DIAGNOSIS — F419 Anxiety disorder, unspecified: Secondary | ICD-10-CM

## 2018-02-12 DIAGNOSIS — F909 Attention-deficit hyperactivity disorder, unspecified type: Secondary | ICD-10-CM

## 2018-02-12 DIAGNOSIS — J302 Other seasonal allergic rhinitis: Secondary | ICD-10-CM

## 2018-02-12 MED ORDER — ALPRAZOLAM 0.5 MG PO TABS: 0.5000 mg | ORAL_TABLET | Freq: Three times a day (TID) | ORAL | 1 refills | Status: DC | PRN

## 2018-02-12 MED ORDER — ALPRAZOLAM 0.5 MG PO TABS
0.5000 mg | ORAL_TABLET | Freq: Three times a day (TID) | ORAL | 1 refills | Status: DC | PRN
Start: 2018-02-12 — End: 2019-04-12

## 2018-02-12 MED ORDER — LISDEXAMFETAMINE DIMESYLATE 50 MG PO CAPS: 50.0000 mg | ORAL_CAPSULE | Freq: Every day | ORAL | 0 refills | Status: DC

## 2018-02-12 MED ORDER — LISDEXAMFETAMINE DIMESYLATE 50 MG PO CAPS
50.0000 mg | ORAL_CAPSULE | Freq: Every day | ORAL | 0 refills | Status: DC
Start: 2018-02-12 — End: 2018-06-01

## 2018-02-12 MED ORDER — RIZATRIPTAN BENZOATE 10 MG PO TBDP: 10.0000 mg | ORAL_TABLET | ORAL | 0 refills | Status: DC | PRN

## 2018-02-12 MED ORDER — LEVOCETIRIZINE DIHYDROCHLORIDE 5 MG PO TABS: 5.0000 mg | ORAL_TABLET | Freq: Every evening | ORAL | 1 refills | Status: DC

## 2018-02-12 NOTE — Progress Notes (Addendum)
Patient ID: Jillian Green, female    DOB: [birth year]  Age: 47 y.o. MRN: [id number]    Subjective:  Subjective   HPI Othel L Jakel presents for anxiety and depression and ADD.   She has had some job problems   Review of Systems  Constitutional: Negative for appetite change, diaphoresis, fatigue and unexpected weight change.  Eyes: Negative for pain, redness and visual disturbance.  Respiratory: Negative for cough, chest tightness, shortness of breath and wheezing.   Cardiovascular: Negative for chest pain, palpitations and leg swelling.  Endocrine: Negative for cold intolerance, heat intolerance, polydipsia, polyphagia and polyuria.  Genitourinary: Negative for difficulty urinating, dysuria and frequency.  Neurological: Negative for dizziness, light-headedness, numbness and headaches.  Psychiatric/Behavioral: Positive for decreased concentration, dysphoric mood and sleep disturbance. Negative for self-injury and suicidal ideas. The patient is nervous/anxious.     History Past Medical History:  Diagnosis Date  . ADD (attention deficit disorder)   . Anxiety   . Depression   . Fibromyalgia   . Migraine   . Unspecified eustachian tube disorder     She has a past surgical history that includes Sinus exploration; Cesarean section; and OTHER SURGICAL HISTORY.   Her family history includes ADD / ADHD in her sister; Heart disease in her father; Hypertension in her father and mother; Other in her father.She reports that she quit smoking about 11 years ago. She quit after 8.00 years of use. She has never used smokeless tobacco. She reports that she drinks about 0.6 oz of alcohol per week. She reports that she does not use drugs.  Current Outpatient Medications on File Prior to Visit  Medication Sig Dispense Refill  . azelastine (OPTIVAR) 0.05 % ophthalmic solution PLACE 1 DROP INTO BOTH EYES 2 (TWO) TIMES DAILY AS NEEDED. 18 mL 0  . buPROPion (WELLBUTRIN XL) 150 MG 24 hr tablet  TAKE 1 TABLET BY MOUTH EVERY DAY 90 tablet 1  . DULoxetine (CYMBALTA) 60 MG capsule Take 1 capsule (60 mg total) by mouth 2 (two) times daily. 180 capsule 1  . DULoxetine (CYMBALTA) 60 MG capsule TAKE ONE CAPSULE BY MOUTH TWICE A DAY 180 capsule 0  . norethindrone-ethinyl estradiol (JUNEL 1/20) 1-20 MG-MCG tablet Take 1 tablet by mouth daily. Takes continuously    . omeprazole (PRILOSEC) 20 MG capsule Take 1 capsule (20 mg total) by mouth daily. 90 capsule 0   No current facility-administered medications on file prior to visit.      Objective:  Objective  Physical Exam  Constitutional: She is oriented to person, place, and time. She appears well-developed and well-nourished.  HENT:  Head: Normocephalic and atraumatic.  Eyes: Conjunctivae and EOM are normal.  Neck: Normal range of motion. Neck supple. No JVD present. Carotid bruit is not present. No thyromegaly present.  Cardiovascular: Normal rate, regular rhythm and normal heart sounds.  No murmur heard. Pulmonary/Chest: Effort normal and breath sounds normal. No respiratory distress. She has no wheezes. She has no rales. She exhibits no tenderness.  Musculoskeletal: She exhibits no edema.  Neurological: She is alert and oriented to person, place, and time.  Psychiatric: Her behavior is normal. Judgment and thought content normal. Her mood appears anxious. Cognition and memory are normal. She exhibits a depressed mood.  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  BP 121/78 (BP Location: Left Arm, Cuff Size: Normal)   Pulse (!) 102   Temp 98.4 F (36.9 C) (Oral)   Resp 16   Ht 5\' 5"  (1.651 m)   Wt 178 lb 6.4 oz (80.9 kg)   SpO2 99%   BMI 29.69 kg/m  Wt Readings from Last 3 Encounters:  02/12/18 178  lb 6.4 oz (80.9 kg)  09/21/17 172 lb (78 kg)  05/22/17 172 lb (78 kg)     Lab Results  Component Value Date   WBC 8.0 10/23/2016   HGB 13.7 10/23/2016   HCT 40.4 10/23/2016   PLT 305 10/23/2016   GLUCOSE 187 (H) 10/23/2016   ALT 39 05/08/2016   AST 25 05/08/2016   NA 135 10/23/2016   K 3.6 10/23/2016   CL 104 10/23/2016   CREATININE 0.69 10/23/2016   BUN 11 10/23/2016   CO2 19 (L) 10/23/2016   TSH 1.63 05/15/2016    Dg Chest 2 View  Result Date: 10/23/2016 CLINICAL DATA:  Weakness and chest pain.  Diaphoresis EXAM: CHEST  2 VIEW COMPARISON:  None. FINDINGS: The heart size and mediastinal contours are within normal limits. Both lungs are clear. The visualized skeletal structures are unremarkable. IMPRESSION: No active cardiopulmonary disease. Electronically Signed   By: Signa Kellaylor  Stroud M.D.   On: 10/23/2016 14:09     Assessment & Plan:  Plan  I have discontinued Charlayne L. Casados's Lorcaserin HCl, SAXENDA, and cefUROXime. I have also changed her levocetirizine. Additionally, I am having her maintain her omeprazole, norethindrone-ethinyl estradiol, DULoxetine, DULoxetine, azelastine, buPROPion, lisdexamfetamine, lisdexamfetamine, lisdexamfetamine, rizatriptan, and ALPRAZolam.  Meds ordered this encounter  Medications  . DISCONTD: ALPRAZolam (XANAX) 0.5 MG tablet    Sig: Take 1 tablet (0.5 mg total) by mouth 3 (three) times daily as needed for anxiety.    Dispense:  45 tablet    Refill:  1  . lisdexamfetamine (VYVANSE) 50 MG capsule    Sig: Take 1 capsule (50 mg total) by mouth daily.    Dispense:  30 capsule    Refill:  0    For Sept 2019  . lisdexamfetamine (VYVANSE) 50 MG capsule    Sig: Take 1 capsule (50 mg total) by mouth daily.    Dispense:  30 capsule    Refill:  0    For August 2019  . lisdexamfetamine (VYVANSE) 50 MG capsule    Sig: Take 1 capsule (50 mg total) by mouth daily.    Dispense:  30 capsule    Refill:  0  . DISCONTD: ALPRAZolam (XANAX) 0.5 MG  tablet    Sig: Take 1 tablet (0.5 mg total) by mouth 3 (three) times daily as needed for anxiety.    Dispense:  45 tablet    Refill:  1  . rizatriptan (MAXALT-MLT) 10 MG disintegrating tablet    Sig: Take 1 tablet (10 mg total) by mouth as needed for migraine. May repeat in 2 hours if needed    Dispense:  10 tablet    Refill:  0  . levocetirizine (XYZAL) 5 MG tablet    Sig: Take 1 tablet (5 mg total) by mouth every evening.    Dispense:  90 tablet    Refill:  1  . ALPRAZolam (XANAX) 0.5 MG tablet    Sig: Take 1 tablet (0.5 mg total) by mouth 3 (three) times daily as needed for anxiety.    Dispense:  45 tablet    Refill:  1    Problem List Items Addressed This Visit      Unprioritized   ADD (attention deficit disorder) - Primary   Relevant Medications   lisdexamfetamine (VYVANSE) 50 MG capsule   lisdexamfetamine (VYVANSE) 50 MG capsule   lisdexamfetamine (VYVANSE) 50 MG capsule   Anxiety   Relevant Medications   ALPRAZolam (XANAX) 0.5 MG tablet   Other Relevant Orders   Pain Mgmt, Profile 8 w/Conf, U   Morbid obesity (HCC)    Refer to Dr Dalbert Garnet con't exercise      Relevant Medications   lisdexamfetamine (VYVANSE) 50 MG capsule   lisdexamfetamine (VYVANSE) 50 MG capsule   lisdexamfetamine (VYVANSE) 50 MG capsule   Other Relevant Orders   Amb Ref to Medical Weight Management   Nonintractable migraine, unspecified migraine type    con't maxalt stable      Relevant Medications   rizatriptan (MAXALT-MLT) 10 MG disintegrating tablet   Seasonal allergies   Relevant Medications   levocetirizine (XYZAL) 5 MG tablet    1. Anxiety See above - Pain Mgmt, Profile 8 w/Conf, U - ALPRAZolam (XANAX) 0.5 MG tablet; Take 1 tablet (0.5 mg total) by mouth 3 (three) times daily as needed for anxiety.  Dispense: 45 tablet; Refill: 1  2. Morbid obesity (HCC)   - Amb Ref to Medical Weight Management  3. Attention deficit hyperactivity disorder (ADHD), unspecified ADHD  type   - lisdexamfetamine (VYVANSE) 50 MG capsule; Take 1 capsule (50 mg total) by mouth daily.  Dispense: 30 capsule; Refill: 0 - lisdexamfetamine (VYVANSE) 50 MG capsule; Take 1 capsule (50 mg total) by mouth daily.  Dispense: 30 capsule; Refill: 0 - lisdexamfetamine (VYVANSE) 50 MG capsule; Take 1 capsule (50 mg total) by mouth daily.  Dispense: 30 capsule; Refill: 0  4. Nonintractable migraine, unspecified migraine type   - rizatriptan (MAXALT-MLT) 10 MG disintegrating tablet; Take 1 tablet (10 mg total) by mouth as needed for migraine. May repeat in 2 hours if needed  Dispense: 10 tablet; Refill: 0  5. Seasonal allergies   - levocetirizine (XYZAL) 5 MG tablet; Take 1 tablet (5 mg total) by mouth every evening.  Dispense: 90 tablet; Refill: 1   Follow-up: Return in about 6 months (around 08/15/2018), or if symptoms worsen or fail to improve.  Donato Schultz, DO

## 2018-02-14 DIAGNOSIS — J302 Other seasonal allergic rhinitis: Secondary | ICD-10-CM | POA: Insufficient documentation

## 2018-02-14 DIAGNOSIS — F419 Anxiety disorder, unspecified: Secondary | ICD-10-CM | POA: Insufficient documentation

## 2018-02-14 DIAGNOSIS — G43009 Migraine without aura, not intractable, without status migrainosus: Secondary | ICD-10-CM | POA: Insufficient documentation

## 2018-02-14 NOTE — Patient Instructions (Signed)

## 2018-02-14 NOTE — Assessment & Plan Note (Signed)
Pt to have counseling con't xanax prn Stress should improve with new job

## 2018-02-14 NOTE — Assessment & Plan Note (Signed)
con't maxalt stable

## 2018-02-14 NOTE — Assessment & Plan Note (Signed)
Refer to Dr Dalbert GarnetBeasley con't exercise

## 2018-02-15 LAB — PAIN MGMT, PROFILE 8 W/CONF, U
6 ACETYLMORPHINE: NEGATIVE ng/mL (ref <10)
Alcohol Metabolites: POSITIVE ng/mL — AB (ref <500)
Amphetamines: NEGATIVE ng/mL (ref <500)
BUPRENORPHINE, URINE: NEGATIVE ng/mL (ref <5)
Benzodiazepines: NEGATIVE ng/mL (ref <100)
Cocaine Metabolite: NEGATIVE ng/mL (ref <150)
Creatinine: 148.7 mg/dL
ETHYL GLUCURONIDE (ETG): 3178 ng/mL — AB (ref <500)
Ethyl Sulfate (ETS): 206 ng/mL — ABNORMAL HIGH (ref <100)
MARIJUANA METABOLITE: NEGATIVE ng/mL (ref <20)
MDMA: NEGATIVE ng/mL (ref <500)
OPIATES: NEGATIVE ng/mL (ref <100)
Oxidant: NEGATIVE ug/mL (ref <200)
Oxycodone: NEGATIVE ng/mL (ref <100)
pH: 7.43 (ref 4.5–9.0)

## 2018-02-17 ENCOUNTER — Other Ambulatory Visit: Payer: Self-pay | Admitting: Family Medicine

## 2018-02-18 ENCOUNTER — Other Ambulatory Visit: Payer: Self-pay

## 2018-02-18 MED ORDER — AZELASTINE HCL 0.05 % OP SOLN: 1.0000 [drp] | Freq: Two times a day (BID) | OPHTHALMIC | 1 refills | Status: DC | PRN

## 2018-02-18 NOTE — Telephone Encounter (Signed)
Refill request for azelastine (OPTIVAR) 0.05% ophthalmic solution.  Last OV: 02/12/18 Last Refill: 01/19/18  Refill sent to pt's pharmacy.

## 2018-03-12 ENCOUNTER — Other Ambulatory Visit: Payer: Self-pay | Admitting: Family Medicine

## 2018-03-12 ENCOUNTER — Encounter: Payer: Self-pay | Admitting: Family Medicine

## 2018-03-12 DIAGNOSIS — F909 Attention-deficit hyperactivity disorder, unspecified type: Secondary | ICD-10-CM

## 2018-03-15 ENCOUNTER — Telehealth: Payer: Self-pay | Admitting: *Deleted

## 2018-03-15 NOTE — Telephone Encounter (Signed)
See my chart message

## 2018-03-15 NOTE — Telephone Encounter (Signed)
Copied from CRM [id number]. Topic: General - Other >> Mar 12, 2018  4:03 PM Percival SpanishKennedy, Cheryl W wrote:  Pt need a med refill fup and can only come on 8//15/19 please check and see if she can be seen this day

## 2018-04-07 ENCOUNTER — Other Ambulatory Visit: Payer: Self-pay | Admitting: Family Medicine

## 2018-04-07 DIAGNOSIS — M797 Fibromyalgia: Secondary | ICD-10-CM

## 2018-04-22 ENCOUNTER — Encounter (INDEPENDENT_AMBULATORY_CARE_PROVIDER_SITE_OTHER): Payer: BC Managed Care – PPO

## 2018-04-26 ENCOUNTER — Encounter (INDEPENDENT_AMBULATORY_CARE_PROVIDER_SITE_OTHER): Payer: Self-pay | Admitting: Family Medicine

## 2018-04-29 ENCOUNTER — Encounter (INDEPENDENT_AMBULATORY_CARE_PROVIDER_SITE_OTHER): Payer: Self-pay | Admitting: Family Medicine

## 2018-04-29 ENCOUNTER — Ambulatory Visit (INDEPENDENT_AMBULATORY_CARE_PROVIDER_SITE_OTHER): Payer: BC Managed Care – PPO | Admitting: Family Medicine

## 2018-04-29 VITALS — BP 125/85 | HR 96 | Temp 97.6°F | Ht 65.0 in | Wt 184.0 lb

## 2018-04-29 DIAGNOSIS — R5383 Other fatigue: Secondary | ICD-10-CM | POA: Diagnosis not present

## 2018-04-29 DIAGNOSIS — Z1331 Encounter for screening for depression: Secondary | ICD-10-CM | POA: Diagnosis not present

## 2018-04-29 DIAGNOSIS — R0602 Shortness of breath: Secondary | ICD-10-CM

## 2018-04-29 DIAGNOSIS — R739 Hyperglycemia, unspecified: Secondary | ICD-10-CM

## 2018-04-29 DIAGNOSIS — E669 Obesity, unspecified: Secondary | ICD-10-CM

## 2018-04-29 DIAGNOSIS — Z0289 Encounter for other administrative examinations: Secondary | ICD-10-CM

## 2018-04-29 DIAGNOSIS — Z9189 Other specified personal risk factors, not elsewhere classified: Secondary | ICD-10-CM | POA: Diagnosis not present

## 2018-04-29 DIAGNOSIS — Z6830 Body mass index (BMI) 30.0-30.9, adult: Secondary | ICD-10-CM

## 2018-04-29 DIAGNOSIS — E66811 Obesity, class 1: Secondary | ICD-10-CM

## 2018-04-29 NOTE — Progress Notes (Deleted)
ecg

## 2018-04-30 LAB — CBC WITH DIFFERENTIAL
BASOS: 1 %
Basophils Absolute: 0 10*3/uL (ref 0.0–0.2)
EOS (ABSOLUTE): 0.2 10*3/uL (ref 0.0–0.4)
EOS: 3 %
HEMATOCRIT: 42.3 % (ref 34.0–46.6)
HEMOGLOBIN: 13.7 g/dL (ref 11.1–15.9)
IMMATURE GRANULOCYTES: 0 %
Immature Grans (Abs): 0 10*3/uL (ref 0.0–0.1)
Lymphocytes Absolute: 2.2 10*3/uL (ref 0.7–3.1)
Lymphs: 32 %
MCH: 30.9 pg (ref 26.6–33.0)
MCHC: 32.4 g/dL (ref 31.5–35.7)
MCV: 95 fL (ref 79–97)
MONOCYTES: 4 %
MONOS ABS: 0.3 10*3/uL (ref 0.1–0.9)
Neutrophils Absolute: 4.1 10*3/uL (ref 1.4–7.0)
Neutrophils: 60 %
RBC: 4.44 x10E6/uL (ref 3.77–5.28)
RDW: 12.8 % (ref 12.3–15.4)
WBC: 6.8 10*3/uL (ref 3.4–10.8)

## 2018-04-30 LAB — VITAMIN D 25 HYDROXY (VIT D DEFICIENCY, FRACTURES): Vit D, 25-Hydroxy: 24 ng/mL — ABNORMAL LOW (ref 30.0–100.0)

## 2018-04-30 LAB — COMPREHENSIVE METABOLIC PANEL
A/G RATIO: 1.8 (ref 1.2–2.2)
ALBUMIN: 4.4 g/dL (ref 3.5–5.5)
ALT: 20 IU/L (ref 0–32)
AST: 17 IU/L (ref 0–40)
Alkaline Phosphatase: 114 IU/L (ref 39–117)
BUN / CREAT RATIO: 14 (ref 9–23)
BUN: 9 mg/dL (ref 6–24)
Bilirubin Total: 0.3 mg/dL (ref 0.0–1.2)
CALCIUM: 9 mg/dL (ref 8.7–10.2)
CO2: 23 mmol/L (ref 20–29)
CREATININE: 0.63 mg/dL (ref 0.57–1.00)
Chloride: 98 mmol/L (ref 96–106)
GFR, EST AFRICAN AMERICAN: 125 mL/min/{1.73_m2} (ref >59)
GFR, EST NON AFRICAN AMERICAN: 109 mL/min/{1.73_m2} (ref >59)
GLOBULIN, TOTAL: 2.4 g/dL (ref 1.5–4.5)
Glucose: 132 mg/dL — ABNORMAL HIGH (ref 65–99)
POTASSIUM: 4.2 mmol/L (ref 3.5–5.2)
SODIUM: 137 mmol/L (ref 134–144)
TOTAL PROTEIN: 6.8 g/dL (ref 6.0–8.5)

## 2018-04-30 LAB — INSULIN, RANDOM: INSULIN: 18.4 u[IU]/mL (ref 2.6–24.9)

## 2018-04-30 LAB — FOLATE: Folate: 13.4 ng/mL (ref >3.0)

## 2018-04-30 LAB — HEMOGLOBIN A1C
Est. average glucose Bld gHb Est-mCnc: 174 mg/dL
HEMOGLOBIN A1C: 7.7 % — AB (ref 4.8–5.6)

## 2018-04-30 LAB — T4, FREE: Free T4: 1.03 ng/dL (ref 0.82–1.77)

## 2018-04-30 LAB — LIPID PANEL WITH LDL/HDL RATIO
Cholesterol, Total: 216 mg/dL — ABNORMAL HIGH (ref 100–199)
HDL: 61 mg/dL (ref >39)
LDL CALC: 123 mg/dL — AB (ref 0–99)
LDL/HDL RATIO: 2 ratio (ref 0.0–3.2)
TRIGLYCERIDES: 158 mg/dL — AB (ref 0–149)
VLDL CHOLESTEROL CAL: 32 mg/dL (ref 5–40)

## 2018-04-30 LAB — VITAMIN B12: VITAMIN B 12: 385 pg/mL (ref 232–1245)

## 2018-04-30 LAB — T3: T3, Total: 126 ng/dL (ref 71–180)

## 2018-04-30 LAB — TSH: TSH: 0.77 u[IU]/mL (ref 0.450–4.500)

## 2018-05-03 NOTE — Progress Notes (Addendum)
Office: [phone]  /  Fax: [phone]   Dear Dr. Zola Button,   Thank you for referring Jillian Green to our clinic. The following note includes my evaluation and treatment recommendations.  HPI:   Chief Complaint: OBESITY    Jillian Green has been referred by Donato Schultz, DO for consultation regarding her obesity and obesity related comorbidities.    Jillian Green (MR# [id number]) is a 47 y.o. female who presents on 05/03/2018 for obesity evaluation and treatment. Current BMI is Body mass index is 30.62 kg/m.[name] Jillian Green has been struggling with her weight for many years and has been unsuccessful in either losing weight, maintaining weight loss, or reaching her healthy weight goal.     Ankita attended our information session and states she is currently in the action stage of change and ready to dedicate time achieving and maintaining a healthier weight. Jillian Green is interested in becoming our patient and working on intensive lifestyle modifications including (but not limited to) diet, exercise and weight loss.    Jillian Green states her family eats meals together she thinks her family will eat healthier with  her her desired weight loss is 58 lbs she started gaining weight in the last 5 yrs her heaviest weight ever was 189 lbs. she has significant food cravings issues  she snacks frequently in the evenings she is frequently drinking liquids with calories she frequently makes poor food choices she has problems with excessive hunger  she struggles with emotional eating    Fatigue Jillian Green feels her energy is lower than it should be. This has worsened with weight gain and has not worsened recently. Jillian Green admits to daytime somnolence and admits to waking up still tired. Patient is at risk for obstructive sleep apnea. Patent has a history of symptoms of daytime fatigue, morning fatigue and morning headache. Patient generally gets 3 or 4 hours of sleep per night, and states they  generally have restless sleep. Snoring is present. Apneic episodes are not present. Epworth Sleepiness Score is 7  EKG was ordered today which shows normal sinus rhythm with abnormal T wave.  Dyspnea on exertion Jillian Green notes increasing shortness of breath with exercising and seems to be worsening over time with weight gain. She notes getting out of breath sooner with activity than she used to. This has not gotten worse recently. EKG was ordered today which shows normal sinus rhythm with abnormal T wave. Jillian Green denies orthopnea.  Hyperglycemia Jillian Green has a history of some elevated blood sugar on previous labs. without  She admits to polyphagia.  At risk for diabetes Jillian Green is at higher than average risk for developing diabetes due to her obesity and hyperglycemia. She currently denies polyuria or polydipsia.  Depression Screen Jillian Green's Food and Mood (modified PHQ-9) score was  Depression screen PHQ 2/9 04/29/2018  Decreased Interest 3  Down, Depressed, Hopeless 3  PHQ - 2 Score 6  Altered sleeping 2  Tired, decreased energy 2  Change in appetite 1  Feeling bad or failure about yourself  1  Trouble concentrating 1  Moving slowly or fidgety/restless 1  Suicidal thoughts 0  PHQ-9 Score 14  Difficult doing work/chores Somewhat difficult    ALLERGIES: Allergies  Allergen Reactions  . Augmentin [Amoxicillin-Pot Clavulanate]     Severe yeast infections    MEDICATIONS: Current Outpatient Medications on File Prior to Visit  Medication Sig Dispense Refill  . ALPRAZolam (XANAX) 0.5 MG tablet Take 1 tablet (0.5 mg total) by mouth 3 (three) times daily  as needed for anxiety. 45 tablet 1  . azelastine (OPTIVAR) 0.05 % ophthalmic solution Place 1 drop into both eyes 2 (two) times daily as needed. 18 mL 1  . buPROPion (WELLBUTRIN XL) 150 MG 24 hr tablet TAKE 1 TABLET BY MOUTH EVERY DAY 90 tablet 1  . DULoxetine (CYMBALTA) 60 MG capsule Take 1 capsule (60 mg total) by mouth 2 (two) times daily. 180  capsule 1  . DULoxetine (CYMBALTA) 60 MG capsule TAKE 1 CAPSULE BY MOUTH TWICE A DAY 180 capsule 0  . levocetirizine (XYZAL) 5 MG tablet Take 1 tablet (5 mg total) by mouth every evening. 90 tablet 1  . lisdexamfetamine (VYVANSE) 50 MG capsule Take 1 capsule (50 mg total) by mouth daily. 30 capsule 0  . lisdexamfetamine (VYVANSE) 50 MG capsule Take 1 capsule (50 mg total) by mouth daily. 30 capsule 0  . lisdexamfetamine (VYVANSE) 50 MG capsule Take 1 capsule (50 mg total) by mouth daily. 30 capsule 0  . norethindrone-ethinyl estradiol (JUNEL 1/20) 1-20 MG-MCG tablet Take 1 tablet by mouth daily. Takes continuously    . omeprazole (PRILOSEC) 20 MG capsule Take 1 capsule (20 mg total) by mouth daily. 90 capsule 0   No current facility-administered medications on file prior to visit.     PAST MEDICAL HISTORY: Past Medical History:  Diagnosis Date  . ADD (attention deficit disorder)   . Anxiety   . Back pain   . Chronic headaches   . Depression   . Fibromyalgia   . GERD (gastroesophageal reflux disease)   . Itchy skin   . Joint pain   . Lactose intolerance   . Migraine   . Sweating increase   . Unspecified eustachian tube disorder     PAST SURGICAL HISTORY: Past Surgical History:  Procedure Laterality Date  . CESAREAN SECTION    . OTHER SURGICAL HISTORY     preforated uterine surgery  . SINUS EXPLORATION      SOCIAL HISTORY: Social History   Tobacco Use  . Smoking status: Former Smoker    Years: 8.00    Last attempt to quit: 05/15/2006    Years since quitting: 11.9  . Smokeless tobacco: Never Used  . Tobacco comment: 1 cig every 4 months  Substance Use Topics  . Alcohol use: Yes    Alcohol/week: 1.0 standard drinks    Types: 1 Glasses of wine per week  . Drug use: No    FAMILY HISTORY: Family History  Problem Relation Age of Onset  . Other Father        Dyslexia  . Heart disease Father   . Hypertension Father   . Alcoholism Father   . Hypertension Mother     . Thyroid disease Mother   . Anxiety disorder Mother   . Sleep apnea Mother   . Obesity Mother   . ADD / ADHD Sister     ROS: Review of Systems  Constitutional: Positive for malaise/fatigue.  HENT: Positive for congestion (nasal stuffiness), hearing loss and sinus pain.        + Hay Fever  Eyes: Positive for redness.  Respiratory: Positive for wheezing.   Cardiovascular: Negative for orthopnea.       + Very Cold Feet or Hands  Gastrointestinal: Positive for heartburn.  Genitourinary: Negative for frequency.  Musculoskeletal: Positive for back pain and neck pain.       + Neck Stiffness + Muscle or Joint Pain + Muscle Stiffness + Red or Swollen Joints  Skin:  Positive for itching.  Neurological: Positive for weakness and headaches.  Endo/Heme/Allergies: Negative for polydipsia. Bruises/bleeds easily (bruising).       Positive for polyphagia  Psychiatric/Behavioral: Positive for depression. The patient has insomnia.        + Stress    PHYSICAL EXAM: Blood pressure 125/85, pulse 96, temperature 97.6 F (36.4 C), temperature source Oral, height 5\' 5"  (1.651 m), weight 184 lb (83.5 kg), last menstrual period 02/18/2018, SpO2 97 %. Body mass index is 30.62 kg/m. Physical Exam  Constitutional: She is oriented to person, place, and time. She appears well-developed and well-nourished.  HENT:  Head: Normocephalic and atraumatic.  Nose: Nose normal.  Eyes: EOM are normal. No scleral icterus.  Neck: Normal range of motion. Neck supple. No thyromegaly present.  Cardiovascular: Normal rate and regular rhythm.  Pulmonary/Chest: Effort normal. No respiratory distress.  Abdominal: Soft. There is no tenderness.  + Obesity  Musculoskeletal: Normal range of motion.  Range of Motion normal in all 4 extremities  Neurological: She is alert and oriented to person, place, and time. Coordination normal.  Skin: Skin is warm and dry.  Psychiatric: She has a normal mood and affect. Her behavior  is normal.  Vitals reviewed.   RECENT LABS AND TESTS: BMET    Component Value Date/Time   NA 137 04/29/2018 1311   K 4.2 04/29/2018 1311   CL 98 04/29/2018 1311   CO2 23 04/29/2018 1311   GLUCOSE 132 (H) 04/29/2018 1311   GLUCOSE 187 (H) 10/23/2016 1348   BUN 9 04/29/2018 1311   CREATININE 0.63 04/29/2018 1311   CALCIUM 9.0 04/29/2018 1311   GFRNONAA 109 04/29/2018 1311   GFRAA 125 04/29/2018 1311   Lab Results  Component Value Date   HGBA1C 7.7 (H) 04/29/2018   Lab Results  Component Value Date   INSULIN 18.4 04/29/2018   CBC    Component Value Date/Time   WBC 6.8 04/29/2018 1311   WBC 8.0 10/23/2016 1348   RBC 4.44 04/29/2018 1311   RBC 4.31 10/23/2016 1348   HGB 13.7 04/29/2018 1311   HCT 42.3 04/29/2018 1311   PLT 305 10/23/2016 1348   MCV 95 04/29/2018 1311   MCH 30.9 04/29/2018 1311   MCH 31.8 10/23/2016 1348   MCHC 32.4 04/29/2018 1311   MCHC 33.9 10/23/2016 1348   RDW 12.8 04/29/2018 1311   LYMPHSABS 2.2 04/29/2018 1311   MONOABS 0.3 09/23/2013 0950   EOSABS 0.2 04/29/2018 1311   BASOSABS 0.0 04/29/2018 1311   Iron/TIBC/Ferritin/ %Sat    Component Value Date/Time   IRON 156 (H) 09/23/2013 0950   FERRITIN 103.5 09/23/2013 0950   IRONPCTSAT 38.0 09/23/2013 0950   Lipid Panel     Component Value Date/Time   CHOL 216 (H) 04/29/2018 1311   TRIG 158 (H) 04/29/2018 1311   HDL 61 04/29/2018 1311   LDLCALC 123 (H) 04/29/2018 1311   Hepatic Function Panel     Component Value Date/Time   PROT 6.8 04/29/2018 1311   ALBUMIN 4.4 04/29/2018 1311   AST 17 04/29/2018 1311   ALT 20 04/29/2018 1311   ALKPHOS 114 04/29/2018 1311   BILITOT 0.3 04/29/2018 1311   BILIDIR 0.0 09/23/2013 0950      Component Value Date/Time   TSH 0.770 04/29/2018 1311   TSH 1.63 05/15/2016 1557   TSH 0.99 09/23/2013 0950    ECG  shows NSR with a rate of 96 BPM INDIRECT CALORIMETER done today shows a VO2 of 343 and a  REE of 2390.  Her calculated basal metabolic rate is  1565 thus her basal metabolic rate is better than expected.    ASSESSMENT AND PLAN: Other fatigue - Plan: EKG 12-Lead, Vitamin B12, CBC With Differential, Folate, Lipid Panel With LDL/HDL Ratio, T3, T4, free, TSH, VITAMIN D 25 Hydroxy (Vit-D Deficiency, Fractures)  Shortness of breath on exertion  Hyperglycemia - Plan: Comprehensive metabolic panel, Hemoglobin A1c, Insulin, random  Depression screening  At risk for diabetes mellitus  Class 1 obesity with serious comorbidity and body mass index (BMI) of 30.0 to 30.9 in adult, unspecified obesity type  PLAN: Fatigue Jillian Green was informed that her fatigue may be related to obesity, depression or many other causes. Labs will be ordered, and in the meanwhile Jillian Green has agreed to work on diet, exercise and weight loss to help with fatigue. Proper sleep hygiene was discussed including the need for 7-8 hours of quality sleep each night. A sleep study was not ordered based on symptoms and Epworth score. We will order indirect calorimetry and EKG today.  Dyspnea on exertion Kylie's shortness of breath appears to be obesity related and exercise induced. She has agreed to work on weight loss and gradually increase exercise to treat her exercise induced shortness of breath. If Tayana follows our instructions and loses weight without improvement of her shortness of breath, we will plan to refer to pulmonology. We will order indirect calorimetry, EKG and labs today. We will monitor this condition regularly. Jeananne agrees to this plan.  Hyperglycemia Fasting labs will be obtained and results with be discussed with Jillian Green in 2 weeks at her follow up visit. In the meanwhile Jillian Green was started on a lower simple carbohydrate diet and will work on weight loss efforts.  Diabetes risk counseling Jillian Green was given extended (15 minutes) diabetes prevention counseling today. She is 47 y.o. female and has risk factors for diabetes including obesity and hyperglycemia. We  discussed intensive lifestyle modifications today with an emphasis on weight loss as well as increasing exercise and decreasing simple carbohydrates in her diet.  Depression Screen Jillian Green had a moderately positive depression screening. Depression is commonly associated with obesity and often results in emotional eating behaviors. We will monitor this closely and work on CBT to help improve the non-hunger eating patterns. Referral to Psychology may be required if no improvement is seen as she continues in our clinic.  Obesity Jillian Green is currently in the action stage of change and her goal is to continue with weight loss efforts. I recommend Jillian Green begin the structured treatment plan as follows:  She has agreed to follow the Category 3 plan  Jillian Green has been instructed to eventually work up to a goal of 150 minutes of combined cardio and strengthening exercise per week for weight loss and overall health benefits. We discussed the following Behavioral Modification Strategies today: planning for success, increasing lean protein intake, increasing vegetables and work on meal planning and easy cooking plans   She was informed of the importance of frequent follow up visits to maximize her success with intensive lifestyle modifications for her multiple health conditions. She was informed we would discuss her lab results at her next visit unless there is a critical issue that needs to be addressed sooner. Jillian Green agreed to keep her next visit at the agreed upon time to discuss these results.    OBESITY BEHAVIORAL INTERVENTION VISIT  Today's visit was # 1   Starting weight: 184 lbs Starting date: 04/29/18 Today's weight : 184 lbs  Today's date: 04/29/2018 Total lbs lost to date: 0   ASK: We discussed the diagnosis of obesity with Kenetra L Carico today and Kalisi agreed to give [place] permission to discuss obesity behavioral modification therapy today.  ASSESS: Skya has the diagnosis of obesity and her BMI  today is 30.62 Desarea is in the action stage of change   ADVISE: Jenniger was educated on the multiple health risks of obesity as well as the benefit of weight loss to improve her health. She was advised of the need for long term treatment and the importance of lifestyle modifications to improve her current health and to decrease her risk of future health problems.  AGREE: Multiple dietary modification options and treatment options were discussed and  Kelci agreed to follow the recommendations documented in the above note.  ARRANGE: Adrinne was educated on the importance of frequent visits to treat obesity as outlined per CMS and USPSTF guidelines and agreed to schedule her next follow up appointment today.  I, [place] Crane, am acting as transcriptionist for Filbert Schilder, MD  I have reviewed the above documentation for accuracy and completeness, and I agree with the above. - Debbra Riding, MD

## 2018-05-13 ENCOUNTER — Ambulatory Visit (INDEPENDENT_AMBULATORY_CARE_PROVIDER_SITE_OTHER): Payer: BC Managed Care – PPO | Admitting: Family Medicine

## 2018-05-13 VITALS — BP 114/75 | HR 85 | Temp 97.9°F | Ht 65.0 in | Wt 181.0 lb

## 2018-05-13 DIAGNOSIS — E119 Type 2 diabetes mellitus without complications: Secondary | ICD-10-CM | POA: Diagnosis not present

## 2018-05-13 DIAGNOSIS — Z9189 Other specified personal risk factors, not elsewhere classified: Secondary | ICD-10-CM | POA: Diagnosis not present

## 2018-05-13 DIAGNOSIS — E7849 Other hyperlipidemia: Secondary | ICD-10-CM | POA: Diagnosis not present

## 2018-05-13 DIAGNOSIS — E559 Vitamin D deficiency, unspecified: Secondary | ICD-10-CM

## 2018-05-13 DIAGNOSIS — E66811 Obesity, class 1: Secondary | ICD-10-CM

## 2018-05-13 DIAGNOSIS — E669 Obesity, unspecified: Secondary | ICD-10-CM

## 2018-05-13 DIAGNOSIS — Z6830 Body mass index (BMI) 30.0-30.9, adult: Secondary | ICD-10-CM

## 2018-05-13 MED ORDER — LISINOPRIL 2.5 MG PO TABS: 2.5000 mg | ORAL_TABLET | Freq: Every day | ORAL | 0 refills | Status: DC

## 2018-05-13 MED ORDER — ATORVASTATIN CALCIUM 10 MG PO TABS: 10.0000 mg | ORAL_TABLET | Freq: Every day | ORAL | 0 refills | Status: DC

## 2018-05-13 MED ORDER — METFORMIN HCL 500 MG PO TABS: 500.0000 mg | ORAL_TABLET | Freq: Every day | ORAL | 0 refills | Status: DC

## 2018-05-13 MED ORDER — VITAMIN D (ERGOCALCIFEROL) 1.25 MG (50000 UNIT) PO CAPS: 50000.0000 [IU] | ORAL_CAPSULE | ORAL | 0 refills | Status: DC

## 2018-05-17 NOTE — Progress Notes (Signed)
Office: [phone]  /  Fax: [phone]   HPI:   Chief Complaint: OBESITY Jillian Green is here to discuss her progress with her obesity treatment plan. She is on the  follow the Category 3 plan and is following her eating plan approximately 85 % of the time. She states she is exercising 0 minutes 0 times per week. Jillian Green denies hunger most days. She is getting most food for the most part and mostly eating around 6 oz of meat at night.  Her weight is 181 lb (82.1 kg) today and has had a weight loss of 3 pounds over a period of 2 weeks since her last visit. She has lost 3 lbs since starting treatment with [place].  Vitamin D deficiency Jillian Green has a diagnosis of vitamin D deficiency. She is not currently taking vit D and denies nausea, vomiting or muscle weakness. She reports fatigue.   Diabetes II Jillian Green has a diagnosis of diabetes type II. Jillian Green  denies any hypoglycemic episodes. Last A1c was 7.7. She states her HgA1c has slowly increased over the years. She admits to carbohydrate cravings.  She has been working on intensive lifestyle modifications including diet, exercise, and weight loss to help control her blood glucose levels.  Hyperlipidemia Jillian Green has hyperlipidemia with an elevated LDL and has been trying to improve her cholesterol levels with intensive lifestyle modification including a low saturated fat diet, exercise and weight loss. She denies any chest pain, claudication or myalgias.  At risk for cardiovascular disease Jillian Green is at a higher than average risk for cardiovascular disease due to obesity. She currently denies any chest pain.  ALLERGIES: Allergies  Allergen Reactions  . Augmentin [Amoxicillin-Pot Clavulanate]     Severe yeast infections    MEDICATIONS: Current Outpatient Medications on File Prior to Visit  Medication Sig Dispense Refill  . ALPRAZolam (XANAX) 0.5 MG tablet Take 1 tablet (0.5 mg total) by mouth 3 (three) times daily as needed for anxiety. 45 tablet 1  .  azelastine (OPTIVAR) 0.05 % ophthalmic solution Place 1 drop into both eyes 2 (two) times daily as needed. 18 mL 1  . buPROPion (WELLBUTRIN XL) 150 MG 24 hr tablet TAKE 1 TABLET BY MOUTH EVERY DAY 90 tablet 1  . DULoxetine (CYMBALTA) 60 MG capsule Take 1 capsule (60 mg total) by mouth 2 (two) times daily. 180 capsule 1  . DULoxetine (CYMBALTA) 60 MG capsule TAKE 1 CAPSULE BY MOUTH TWICE A DAY 180 capsule 0  . levocetirizine (XYZAL) 5 MG tablet Take 1 tablet (5 mg total) by mouth every evening. 90 tablet 1  . lisdexamfetamine (VYVANSE) 50 MG capsule Take 1 capsule (50 mg total) by mouth daily. 30 capsule 0  . lisdexamfetamine (VYVANSE) 50 MG capsule Take 1 capsule (50 mg total) by mouth daily. 30 capsule 0  . lisdexamfetamine (VYVANSE) 50 MG capsule Take 1 capsule (50 mg total) by mouth daily. 30 capsule 0  . norethindrone-ethinyl estradiol (JUNEL 1/20) 1-20 MG-MCG tablet Take 1 tablet by mouth daily. Takes continuously    . omeprazole (PRILOSEC) 20 MG capsule Take 1 capsule (20 mg total) by mouth daily. 90 capsule 0   No current facility-administered medications on file prior to visit.     PAST MEDICAL HISTORY: Past Medical History:  Diagnosis Date  . ADD (attention deficit disorder)   . Anxiety   . Back pain   . Chronic headaches   . Depression   . Fibromyalgia   . GERD (gastroesophageal reflux disease)   . Itchy  skin   . Joint pain   . Lactose intolerance   . Migraine   . Sweating increase   . Unspecified eustachian tube disorder     PAST SURGICAL HISTORY: Past Surgical History:  Procedure Laterality Date  . CESAREAN SECTION    . OTHER SURGICAL HISTORY     preforated uterine surgery  . SINUS EXPLORATION      SOCIAL HISTORY: Social History   Tobacco Use  . Smoking status: Former Smoker    Years: 8.00    Last attempt to quit: 05/15/2006    Years since quitting: 12.0  . Smokeless tobacco: Never Used  . Tobacco comment: 1 cig every 4 months  Substance Use Topics  .  Alcohol use: Yes    Alcohol/week: 1.0 standard drinks    Types: 1 Glasses of wine per week  . Drug use: No    FAMILY HISTORY: Family History  Problem Relation Age of Onset  . Other Father        Dyslexia  . Heart disease Father   . Hypertension Father   . Alcoholism Father   . Hypertension Mother   . Thyroid disease Mother   . Anxiety disorder Mother   . Sleep apnea Mother   . Obesity Mother   . ADD / ADHD Sister     ROS: Review of Systems  Constitutional: Positive for malaise/fatigue and weight loss.  Cardiovascular: Negative for chest pain and claudication.  Gastrointestinal: Negative for nausea and vomiting.  Musculoskeletal: Negative for myalgias.       Negative for muscle weakness  Endo/Heme/Allergies:       Negative for hypoglycemia     PHYSICAL EXAM: Blood pressure 114/75, pulse 85, temperature 97.9 F (36.6 C), temperature source Oral, height 5\' 5"  (1.651 m), weight 181 lb (82.1 kg), SpO2 98 %. Body mass index is 30.12 kg/m. Physical Exam  Constitutional: She is oriented to person, place, and time. She appears well-developed and well-nourished.  HENT:  Head: Normocephalic.  Neck: Normal range of motion.  Cardiovascular: Normal rate.  Pulmonary/Chest: Effort normal.  Musculoskeletal: Normal range of motion.  Neurological: She is alert and oriented to person, place, and time.  Skin: Skin is warm and dry.  Psychiatric: She has a normal mood and affect. Her behavior is normal.  Vitals reviewed.   RECENT LABS AND TESTS: BMET    Component Value Date/Time   NA 137 04/29/2018 1311   K 4.2 04/29/2018 1311   CL 98 04/29/2018 1311   CO2 23 04/29/2018 1311   GLUCOSE 132 (H) 04/29/2018 1311   GLUCOSE 187 (H) 10/23/2016 1348   BUN 9 04/29/2018 1311   CREATININE 0.63 04/29/2018 1311   CALCIUM 9.0 04/29/2018 1311   GFRNONAA 109 04/29/2018 1311   GFRAA 125 04/29/2018 1311   Lab Results  Component Value Date   HGBA1C 7.7 (H) 04/29/2018   Lab Results    Component Value Date   INSULIN 18.4 04/29/2018   CBC    Component Value Date/Time   WBC 6.8 04/29/2018 1311   WBC 8.0 10/23/2016 1348   RBC 4.44 04/29/2018 1311   RBC 4.31 10/23/2016 1348   HGB 13.7 04/29/2018 1311   HCT 42.3 04/29/2018 1311   PLT 305 10/23/2016 1348   MCV 95 04/29/2018 1311   MCH 30.9 04/29/2018 1311   MCH 31.8 10/23/2016 1348   MCHC 32.4 04/29/2018 1311   MCHC 33.9 10/23/2016 1348   RDW 12.8 04/29/2018 1311   LYMPHSABS 2.2 04/29/2018 1311  MONOABS 0.3 09/23/2013 0950   EOSABS 0.2 04/29/2018 1311   BASOSABS 0.0 04/29/2018 1311   Iron/TIBC/Ferritin/ %Sat    Component Value Date/Time   IRON 156 (H) 09/23/2013 0950   FERRITIN 103.5 09/23/2013 0950   IRONPCTSAT 38.0 09/23/2013 0950   Lipid Panel     Component Value Date/Time   CHOL 216 (H) 04/29/2018 1311   TRIG 158 (H) 04/29/2018 1311   HDL 61 04/29/2018 1311   LDLCALC 123 (H) 04/29/2018 1311   Hepatic Function Panel     Component Value Date/Time   PROT 6.8 04/29/2018 1311   ALBUMIN 4.4 04/29/2018 1311   AST 17 04/29/2018 1311   ALT 20 04/29/2018 1311   ALKPHOS 114 04/29/2018 1311   BILITOT 0.3 04/29/2018 1311   BILIDIR 0.0 09/23/2013 0950      Component Value Date/Time   TSH 0.770 04/29/2018 1311   TSH 1.63 05/15/2016 1557   TSH 0.99 09/23/2013 0950    Ref. Range 04/29/2018 13:11  Vitamin D, 25-Hydroxy Latest Ref Range: 30.0 - 100.0 ng/mL 24.0 (L)    ASSESSMENT AND PLAN: Vitamin D deficiency - Plan: Vitamin D, Ergocalciferol, (DRISDOL) 50000 units CAPS capsule  Type 2 diabetes mellitus without complication, without long-term current use of insulin (HCC) - Plan: metFORMIN (GLUCOPHAGE) 500 MG tablet, lisinopril (PRINIVIL,ZESTRIL) 2.5 MG tablet  Other hyperlipidemia - Plan: atorvastatin (LIPITOR) 10 MG tablet  At risk for heart disease  Class 1 obesity with serious comorbidity and body mass index (BMI) of 30.0 to 30.9 in adult, unspecified obesity type  PLAN: Vitamin D  Deficiency Jillian Green was informed that low vitamin D levels contributes to fatigue and are associated with obesity, breast, and colon cancer. She agrees to start to take prescription Vit D @50 ,000 IU every week #4 with no refills and will follow up for routine testing of vitamin D, at least 2-3 times per year. She was informed of the risk of over-replacement of vitamin D and agrees to not increase her dose unless she discusses this with [place] first. Agrees to follow up with our clinic as directed.   Diabetes II Jillian Green has been given extensive diabetes education by myself today including ideal fasting and post-prandial blood glucose readings, individual ideal HgA1c goals  and hypoglycemia prevention. We discussed the importance of good blood sugar control to decrease the likelihood of diabetic complications such as nephropathy, neuropathy, limb loss, blindness, coronary artery disease, and death. We discussed the importance of intensive lifestyle modification including diet, exercise and weight loss as the first line treatment for diabetes. Lue agrees to start Metformin 500 mg daily #30 with no refills and Atorvastatin 10 mg daily #30 with no refills and Lisinopril 2.5 mg daily #30 with no refills. She will follow up at the agreed upon time.  Hyperlipidemia Jillian Green was informed of the American Heart Association Guidelines emphasizing intensive lifestyle modifications as the first line treatment for hyperlipidemia. We discussed many lifestyle modifications today in depth, and Jillian Green will continue to work on decreasing saturated fats such as fatty red meat, butter and many fried foods. She will also increase vegetables and lean protein in her diet and continue to work on exercise and weight loss efforts. She agrees to start Atorvastatin. Agrees to follow up with our clinic as directed.   Cardiovascular risk counseling Jillian Green was given extended (15 minutes) coronary artery disease prevention counseling today. She is 47  y.o. female and has risk factors for heart disease including obesity. We discussed intensive lifestyle modifications today with  an emphasis on specific weight loss instructions and strategies. Pt was also informed of the importance of increasing exercise and decreasing saturated fats to help prevent heart disease.  Obesity Jillian Green is currently in the action stage of change. As such, her goal is to continue with weight loss efforts She has agreed to follow the Category 3 plan Jillian Green has been instructed to work up to a goal of 150 minutes of combined cardio and strengthening exercise per week for weight loss and overall health benefits. We discussed the following Behavioral Modification Strategies today: increasing lean protein intake, increasing vegetables, better snacking choices, planning for success, and work on meal planning and easy cooking plans   Jillian Green has agreed to follow up with our clinic in 2 weeks. She was informed of the importance of frequent follow up visits to maximize her success with intensive lifestyle modifications for her multiple health conditions.   OBESITY BEHAVIORAL INTERVENTION VISIT  Today's visit was # 2   Starting weight: 184 lb Starting date: 04/29/18 Today's weight : 181 lb Today's date: 05/13/18 Total lbs lost to date: 3 lb    ASK: We discussed the diagnosis of obesity with Jillian Green today and Jillian Green agreed to give [place] permission to discuss obesity behavioral modification therapy today.  ASSESS: Jillian Green has the diagnosis of obesity and her BMI today is 30.12 Jillian Green is in the action stage of change   ADVISE: Jillian Green was educated on the multiple health risks of obesity as well as the benefit of weight loss to improve her health. She was advised of the need for long term treatment and the importance of lifestyle modifications to improve her current health and to decrease her risk of future health problems.  AGREE: Multiple dietary modification options and  treatment options were discussed and  Jillian Green agreed to follow the recommendations documented in the above note.  ARRANGE: Jillian Green was educated on the importance of frequent visits to treat obesity as outlined per CMS and USPSTF guidelines and agreed to schedule her next follow up appointment today.  I, Jeralene Peters, am acting as transcriptionist for Debbra Riding, MD   I have reviewed the above documentation for accuracy and completeness, and I agree with the above. - Debbra Riding, MD

## 2018-05-31 ENCOUNTER — Ambulatory Visit (INDEPENDENT_AMBULATORY_CARE_PROVIDER_SITE_OTHER): Payer: BC Managed Care – PPO | Admitting: Physician Assistant

## 2018-05-31 ENCOUNTER — Encounter (INDEPENDENT_AMBULATORY_CARE_PROVIDER_SITE_OTHER): Payer: Self-pay | Admitting: Physician Assistant

## 2018-05-31 VITALS — BP 108/77 | HR 98 | Temp 98.6°F | Ht 65.0 in | Wt 176.0 lb

## 2018-05-31 DIAGNOSIS — Z6830 Body mass index (BMI) 30.0-30.9, adult: Secondary | ICD-10-CM

## 2018-05-31 DIAGNOSIS — E669 Obesity, unspecified: Secondary | ICD-10-CM

## 2018-05-31 DIAGNOSIS — E119 Type 2 diabetes mellitus without complications: Secondary | ICD-10-CM | POA: Diagnosis not present

## 2018-06-01 ENCOUNTER — Other Ambulatory Visit: Payer: Self-pay | Admitting: Family Medicine

## 2018-06-01 ENCOUNTER — Encounter: Payer: Self-pay | Admitting: Family Medicine

## 2018-06-01 DIAGNOSIS — F909 Attention-deficit hyperactivity disorder, unspecified type: Secondary | ICD-10-CM

## 2018-06-01 MED ORDER — LISDEXAMFETAMINE DIMESYLATE 50 MG PO CAPS: 50.0000 mg | ORAL_CAPSULE | Freq: Every day | ORAL | 0 refills | Status: DC

## 2018-06-01 NOTE — Telephone Encounter (Signed)
Refilled x1 Is she due for uds ?

## 2018-06-01 NOTE — Telephone Encounter (Signed)
Pt is requesting refill on Vyvanse.   Last OV: 02/12/2018 Last Fill: 02/12/2018 #30 and 0RF (For July, August, September) UDS: 02/12/2018 Moderate risk

## 2018-06-01 NOTE — Progress Notes (Signed)
Office: [phone]  /  Fax: [phone]   HPI:   Chief Complaint: OBESITY Jillian Green is here to discuss her progress with her obesity treatment plan. She is on the Category 3 plan and is following her eating plan approximately 60 % of the time. She states she is exercising 0 minutes 0 times per week. Jillian Green did very well with weight loss. She reports that she ate out a lot last week on school field trips, but made good choices. She wants to start walking.  Her weight is 176 lb (79.8 kg) today and has had a weight loss of 5 pounds over a period of 2 to 3 weeks since her last visit. She has lost 8 lbs since starting treatment with [place].  Diabetes II Jillian Green has a diagnosis of diabetes type II. Jillian Green denies nausea, vomiting, or diarrhea on metformin. She denies hypoglycemia or polyphagia. Last A1c was 7.7. She has been working on intensive lifestyle modifications including diet, exercise, and weight loss to help control her blood glucose levels.  ALLERGIES: Allergies  Allergen Reactions  . Augmentin [Amoxicillin-Pot Clavulanate]     Severe yeast infections    MEDICATIONS: Current Outpatient Medications on File Prior to Visit  Medication Sig Dispense Refill  . ALPRAZolam (XANAX) 0.5 MG tablet Take 1 tablet (0.5 mg total) by mouth 3 (three) times daily as needed for anxiety. 45 tablet 1  . atorvastatin (LIPITOR) 10 MG tablet Take 1 tablet (10 mg total) by mouth daily. 30 tablet 0  . azelastine (OPTIVAR) 0.05 % ophthalmic solution Place 1 drop into both eyes 2 (two) times daily as needed. 18 mL 1  . buPROPion (WELLBUTRIN XL) 150 MG 24 hr tablet TAKE 1 TABLET BY MOUTH EVERY DAY 90 tablet 1  . DULoxetine (CYMBALTA) 60 MG capsule Take 1 capsule (60 mg total) by mouth 2 (two) times daily. 180 capsule 1  . DULoxetine (CYMBALTA) 60 MG capsule TAKE 1 CAPSULE BY MOUTH TWICE A DAY 180 capsule 0  . levocetirizine (XYZAL) 5 MG tablet Take 1 tablet (5 mg total) by mouth every evening. 90 tablet 1  .  lisinopril (PRINIVIL,ZESTRIL) 2.5 MG tablet Take 1 tablet (2.5 mg total) by mouth daily. 30 tablet 0  . metFORMIN (GLUCOPHAGE) 500 MG tablet Take 1 tablet (500 mg total) by mouth daily with breakfast. 30 tablet 0  . norethindrone-ethinyl estradiol (JUNEL 1/20) 1-20 MG-MCG tablet Take 1 tablet by mouth daily. Takes continuously    . omeprazole (PRILOSEC) 20 MG capsule Take 1 capsule (20 mg total) by mouth daily. 90 capsule 0  . Vitamin D, Ergocalciferol, (DRISDOL) 50000 units CAPS capsule Take 1 capsule (50,000 Units total) by mouth every 7 (seven) days. 4 capsule 0   No current facility-administered medications on file prior to visit.     PAST MEDICAL HISTORY: Past Medical History:  Diagnosis Date  . ADD (attention deficit disorder)   . Anxiety   . Back pain   . Chronic headaches   . Depression   . Fibromyalgia   . GERD (gastroesophageal reflux disease)   . Itchy skin   . Joint pain   . Lactose intolerance   . Migraine   . Sweating increase   . Unspecified eustachian tube disorder     PAST SURGICAL HISTORY: Past Surgical History:  Procedure Laterality Date  . CESAREAN SECTION    . OTHER SURGICAL HISTORY     preforated uterine surgery  . SINUS EXPLORATION      SOCIAL HISTORY: Social History  Tobacco Use  . Smoking status: Former Smoker    Years: 8.00    Last attempt to quit: 05/15/2006    Years since quitting: 12.0  . Smokeless tobacco: Never Used  . Tobacco comment: 1 cig every 4 months  Substance Use Topics  . Alcohol use: Yes    Alcohol/week: 1.0 standard drinks    Types: 1 Glasses of wine per week  . Drug use: No    FAMILY HISTORY: Family History  Problem Relation Age of Onset  . Other Father        Dyslexia  . Heart disease Father   . Hypertension Father   . Alcoholism Father   . Hypertension Mother   . Thyroid disease Mother   . Anxiety disorder Mother   . Sleep apnea Mother   . Obesity Mother   . ADD / ADHD Sister     ROS: Review of Systems   Constitutional: Positive for weight loss.  Gastrointestinal: Negative for diarrhea, nausea and vomiting.  Endo/Heme/Allergies:       Negative hypoglycemia Negative polyphagia    PHYSICAL EXAM: Blood pressure 108/77, pulse 98, temperature 98.6 F (37 C), temperature source Oral, height 5\' 5"  (1.651 m), weight 176 lb (79.8 kg), SpO2 94 %. Body mass index is 29.29 kg/m. Physical Exam  Constitutional: She is oriented to person, place, and time. She appears well-developed and well-nourished.  Cardiovascular: Normal rate.  Pulmonary/Chest: Effort normal.  Musculoskeletal: Normal range of motion.  Neurological: She is oriented to person, place, and time.  Skin: Skin is warm and dry.  Psychiatric: She has a normal mood and affect. Her behavior is normal.  Vitals reviewed.   RECENT LABS AND TESTS: BMET    Component Value Date/Time   NA 137 04/29/2018 1311   K 4.2 04/29/2018 1311   CL 98 04/29/2018 1311   CO2 23 04/29/2018 1311   GLUCOSE 132 (H) 04/29/2018 1311   GLUCOSE 187 (H) 10/23/2016 1348   BUN 9 04/29/2018 1311   CREATININE 0.63 04/29/2018 1311   CALCIUM 9.0 04/29/2018 1311   GFRNONAA 109 04/29/2018 1311   GFRAA 125 04/29/2018 1311   Lab Results  Component Value Date   HGBA1C 7.7 (H) 04/29/2018   Lab Results  Component Value Date   INSULIN 18.4 04/29/2018   CBC    Component Value Date/Time   WBC 6.8 04/29/2018 1311   WBC 8.0 10/23/2016 1348   RBC 4.44 04/29/2018 1311   RBC 4.31 10/23/2016 1348   HGB 13.7 04/29/2018 1311   HCT 42.3 04/29/2018 1311   PLT 305 10/23/2016 1348   MCV 95 04/29/2018 1311   MCH 30.9 04/29/2018 1311   MCH 31.8 10/23/2016 1348   MCHC 32.4 04/29/2018 1311   MCHC 33.9 10/23/2016 1348   RDW 12.8 04/29/2018 1311   LYMPHSABS 2.2 04/29/2018 1311   MONOABS 0.3 09/23/2013 0950   EOSABS 0.2 04/29/2018 1311   BASOSABS 0.0 04/29/2018 1311   Iron/TIBC/Ferritin/ %Sat    Component Value Date/Time   IRON 156 (H) 09/23/2013 0950   FERRITIN  103.5 09/23/2013 0950   IRONPCTSAT 38.0 09/23/2013 0950   Lipid Panel     Component Value Date/Time   CHOL 216 (H) 04/29/2018 1311   TRIG 158 (H) 04/29/2018 1311   HDL 61 04/29/2018 1311   LDLCALC 123 (H) 04/29/2018 1311   Hepatic Function Panel     Component Value Date/Time   PROT 6.8 04/29/2018 1311   ALBUMIN 4.4 04/29/2018 1311   AST 17  04/29/2018 1311   ALT 20 04/29/2018 1311   ALKPHOS 114 04/29/2018 1311   BILITOT 0.3 04/29/2018 1311   BILIDIR 0.0 09/23/2013 0950      Component Value Date/Time   TSH 0.770 04/29/2018 1311   TSH 1.63 05/15/2016 1557   TSH 0.99 09/23/2013 0950    ASSESSMENT AND PLAN: Type 2 diabetes mellitus without complication, without long-term current use of insulin (HCC)  Class 1 obesity with serious comorbidity and body mass index (BMI) of 30.0 to 30.9 in adult, unspecified obesity type - Starting BMI greater then 30  PLAN:  Diabetes II Jillian Green has been given extensive diabetes education by myself today including ideal fasting and post-prandial blood glucose readings, individual ideal Hgb A1c goals and hypoglycemia prevention. We discussed the importance of good blood sugar control to decrease the likelihood of diabetic complications such as nephropathy, neuropathy, limb loss, blindness, coronary artery disease, and death. We discussed the importance of intensive lifestyle modification including diet, exercise and weight loss as the first line treatment for diabetes. Jillian Green agrees to continue taking metformin, diet, and weight loss, and she agrees to follow up with our clinic in 2 weeks.  I spent > than 50% of the 15 minute visit on counseling as documented in the note.  Obesity Jillian Green is currently in the action stage of change. As such, her goal is to continue with weight loss efforts She has agreed to follow the Category 3 plan Jillian Green has been instructed to work up to a goal of 150 minutes of combined cardio and strengthening exercise per week for  weight loss and overall health benefits. We discussed the following Behavioral Modification Strategies today: work on meal planning and easy cooking plans and ways to avoid boredom eating   Jillian Green has agreed to follow up with our clinic in 2 weeks. She was informed of the importance of frequent follow up visits to maximize her success with intensive lifestyle modifications for her multiple health conditions.   OBESITY BEHAVIORAL INTERVENTION VISIT  Today's visit was # 3   Starting weight: 184 lbs Starting date: 04/29/18 Today's weight : 176 lbs  Today's date: 05/31/2018 Total lbs lost to date: 8    ASK: We discussed the diagnosis of obesity with Jillian Green today and Jillian Green agreed to give [place] permission to discuss obesity behavioral modification therapy today.  ASSESS: Jillian Green has the diagnosis of obesity and her BMI today is 29.29 Jillian Green is in the action stage of change   ADVISE: Jillian Green was educated on the multiple health risks of obesity as well as the benefit of weight loss to improve her health. She was advised of the need for long term treatment and the importance of lifestyle modifications.  AGREE: Multiple dietary modification options and treatment options were discussed and  Jillian Green agreed to the above obesity treatment plan.  Jillian Green, am acting as transcriptionist for Alois Cliche, PA-C I, Alois Cliche, PA-C have reviewed above note and agree with its content

## 2018-06-03 ENCOUNTER — Other Ambulatory Visit (INDEPENDENT_AMBULATORY_CARE_PROVIDER_SITE_OTHER): Payer: Self-pay | Admitting: Family Medicine

## 2018-06-03 DIAGNOSIS — E119 Type 2 diabetes mellitus without complications: Secondary | ICD-10-CM

## 2018-06-03 DIAGNOSIS — E7849 Other hyperlipidemia: Secondary | ICD-10-CM

## 2018-06-07 ENCOUNTER — Other Ambulatory Visit (INDEPENDENT_AMBULATORY_CARE_PROVIDER_SITE_OTHER): Payer: Self-pay | Admitting: Family Medicine

## 2018-06-07 DIAGNOSIS — E119 Type 2 diabetes mellitus without complications: Secondary | ICD-10-CM

## 2018-06-07 DIAGNOSIS — E7849 Other hyperlipidemia: Secondary | ICD-10-CM

## 2018-06-07 DIAGNOSIS — E559 Vitamin D deficiency, unspecified: Secondary | ICD-10-CM

## 2018-06-12 ENCOUNTER — Other Ambulatory Visit: Payer: Self-pay | Admitting: Family Medicine

## 2018-06-12 DIAGNOSIS — G43009 Migraine without aura, not intractable, without status migrainosus: Secondary | ICD-10-CM

## 2018-06-14 ENCOUNTER — Ambulatory Visit (INDEPENDENT_AMBULATORY_CARE_PROVIDER_SITE_OTHER): Payer: BC Managed Care – PPO | Admitting: Physician Assistant

## 2018-06-14 ENCOUNTER — Encounter (INDEPENDENT_AMBULATORY_CARE_PROVIDER_SITE_OTHER): Payer: Self-pay | Admitting: Physician Assistant

## 2018-06-14 VITALS — BP 104/71 | HR 102 | Temp 98.2°F | Ht 65.0 in | Wt 173.0 lb

## 2018-06-14 DIAGNOSIS — E669 Obesity, unspecified: Secondary | ICD-10-CM | POA: Diagnosis not present

## 2018-06-14 DIAGNOSIS — E119 Type 2 diabetes mellitus without complications: Secondary | ICD-10-CM | POA: Diagnosis not present

## 2018-06-14 DIAGNOSIS — Z6830 Body mass index (BMI) 30.0-30.9, adult: Secondary | ICD-10-CM | POA: Diagnosis not present

## 2018-06-14 DIAGNOSIS — E66811 Obesity, class 1: Secondary | ICD-10-CM

## 2018-06-15 NOTE — Progress Notes (Signed)
Office: [phone]  /  Fax: [phone]   HPI:   Chief Complaint: OBESITY Jillian Green is here to discuss her progress with her obesity treatment plan. She is on the Category 3 plan and is following her eating plan approximately 85 % of the time. She states she is exercising 0 minutes 0 times per week. Jillian Green did very well with weight loss. She reports that she has been getting all of her protein in.  Her weight is 173 lb (78.5 kg) today and has had a weight loss of 3 pounds over a period of 2 weeks since her last visit. She has lost 11 lbs since starting treatment with [place].  Diabetes II Jillian Green has a diagnosis of diabetes type II. Jillian Green states she is not checking blood sugars currently. Last A1c was 7.7. She denies hypoglycemia.  She has been working on intensive lifestyle modifications including diet, exercise, and weight loss to help control her blood glucose levels.  ALLERGIES: Allergies  Allergen Reactions  . Augmentin [Amoxicillin-Pot Clavulanate]     Severe yeast infections    MEDICATIONS: Current Outpatient Medications on File Prior to Visit  Medication Sig Dispense Refill  . ALPRAZolam (XANAX) 0.5 MG tablet Take 1 tablet (0.5 mg total) by mouth 3 (three) times daily as needed for anxiety. 45 tablet 1  . atorvastatin (LIPITOR) 10 MG tablet TAKE 1 TABLET BY MOUTH EVERY DAY 30 tablet 0  . atorvastatin (LIPITOR) 10 MG tablet TAKE 1 TABLET BY MOUTH EVERY DAY 30 tablet 0  . azelastine (OPTIVAR) 0.05 % ophthalmic solution Place 1 drop into both eyes 2 (two) times daily as needed. 18 mL 1  . buPROPion (WELLBUTRIN XL) 150 MG 24 hr tablet TAKE 1 TABLET BY MOUTH EVERY DAY 90 tablet 1  . DULoxetine (CYMBALTA) 60 MG capsule Take 1 capsule (60 mg total) by mouth 2 (two) times daily. 180 capsule 1  . DULoxetine (CYMBALTA) 60 MG capsule TAKE 1 CAPSULE BY MOUTH TWICE A DAY 180 capsule 0  . levocetirizine (XYZAL) 5 MG tablet Take 1 tablet (5 mg total) by mouth every evening. 90 tablet 1  .  lisdexamfetamine (VYVANSE) 50 MG capsule Take 1 capsule (50 mg total) by mouth daily. 30 capsule 0  . lisinopril (PRINIVIL,ZESTRIL) 2.5 MG tablet TAKE 1 TABLET BY MOUTH EVERY DAY 30 tablet 0  . metFORMIN (GLUCOPHAGE) 500 MG tablet TAKE 1 TABLET BY MOUTH EVERY DAY WITH BREAKFAST 30 tablet 0  . metFORMIN (GLUCOPHAGE) 500 MG tablet TAKE 1 TABLET BY MOUTH EVERY DAY WITH BREAKFAST 30 tablet 0  . norethindrone-ethinyl estradiol (JUNEL 1/20) 1-20 MG-MCG tablet Take 1 tablet by mouth daily. Takes continuously    . omeprazole (PRILOSEC) 20 MG capsule Take 1 capsule (20 mg total) by mouth daily. 90 capsule 0  . rizatriptan (MAXALT-MLT) 10 MG disintegrating tablet TAKE 1 TABLET (10 MG TOTAL) BY MOUTH AS NEEDED FOR MIGRAINE. MAY REPEAT IN 2 HOURS IF NEEDED 10 tablet 0  . Vitamin D, Ergocalciferol, (DRISDOL) 50000 units CAPS capsule TAKE 1 CAPSULE (50,000 UNITS TOTAL) BY MOUTH EVERY 7 (SEVEN) DAYS. 4 capsule 0   No current facility-administered medications on file prior to visit.     PAST MEDICAL HISTORY: Past Medical History:  Diagnosis Date  . ADD (attention deficit disorder)   . Anxiety   . Back pain   . Chronic headaches   . Depression   . Fibromyalgia   . GERD (gastroesophageal reflux disease)   . Itchy skin   . Joint pain   .  Lactose intolerance   . Migraine   . Sweating increase   . Unspecified eustachian tube disorder     PAST SURGICAL HISTORY: Past Surgical History:  Procedure Laterality Date  . CESAREAN SECTION    . OTHER SURGICAL HISTORY     preforated uterine surgery  . SINUS EXPLORATION      SOCIAL HISTORY: Social History   Tobacco Use  . Smoking status: Former Smoker    Years: 8.00    Last attempt to quit: 05/15/2006    Years since quitting: 12.0  . Smokeless tobacco: Never Used  . Tobacco comment: 1 cig every 4 months  Substance Use Topics  . Alcohol use: Yes    Alcohol/week: 1.0 standard drinks    Types: 1 Glasses of wine per week  . Drug use: No    FAMILY  HISTORY: Family History  Problem Relation Age of Onset  . Other Father        Dyslexia  . Heart disease Father   . Hypertension Father   . Alcoholism Father   . Hypertension Mother   . Thyroid disease Mother   . Anxiety disorder Mother   . Sleep apnea Mother   . Obesity Mother   . ADD / ADHD Sister     ROS: Review of Systems  Constitutional: Positive for weight loss.  Endo/Heme/Allergies:       Negative hypoglycemia    PHYSICAL EXAM: Blood pressure 104/71, pulse (!) 102, temperature 98.2 F (36.8 C), temperature source Oral, height 5\' 5"  (1.651 m), weight 173 lb (78.5 kg), SpO2 98 %. Body mass index is 28.79 kg/m. Physical Exam  Constitutional: She is oriented to person, place, and time. She appears well-developed and well-nourished.  Cardiovascular: Normal rate.  Pulmonary/Chest: Effort normal.  Musculoskeletal: Normal range of motion.  Neurological: She is oriented to person, place, and time.  Skin: Skin is warm and dry.  Psychiatric: She has a normal mood and affect. Her behavior is normal.  Vitals reviewed.   RECENT LABS AND TESTS: BMET    Component Value Date/Time   NA 137 04/29/2018 1311   K 4.2 04/29/2018 1311   CL 98 04/29/2018 1311   CO2 23 04/29/2018 1311   GLUCOSE 132 (H) 04/29/2018 1311   GLUCOSE 187 (H) 10/23/2016 1348   BUN 9 04/29/2018 1311   CREATININE 0.63 04/29/2018 1311   CALCIUM 9.0 04/29/2018 1311   GFRNONAA 109 04/29/2018 1311   GFRAA 125 04/29/2018 1311   Lab Results  Component Value Date   HGBA1C 7.7 (H) 04/29/2018   Lab Results  Component Value Date   INSULIN 18.4 04/29/2018   CBC    Component Value Date/Time   WBC 6.8 04/29/2018 1311   WBC 8.0 10/23/2016 1348   RBC 4.44 04/29/2018 1311   RBC 4.31 10/23/2016 1348   HGB 13.7 04/29/2018 1311   HCT 42.3 04/29/2018 1311   PLT 305 10/23/2016 1348   MCV 95 04/29/2018 1311   MCH 30.9 04/29/2018 1311   MCH 31.8 10/23/2016 1348   MCHC 32.4 04/29/2018 1311   MCHC 33.9  10/23/2016 1348   RDW 12.8 04/29/2018 1311   LYMPHSABS 2.2 04/29/2018 1311   MONOABS 0.3 09/23/2013 0950   EOSABS 0.2 04/29/2018 1311   BASOSABS 0.0 04/29/2018 1311   Iron/TIBC/Ferritin/ %Sat    Component Value Date/Time   IRON 156 (H) 09/23/2013 0950   FERRITIN 103.5 09/23/2013 0950   IRONPCTSAT 38.0 09/23/2013 0950   Lipid Panel     Component Value  Date/Time   CHOL 216 (H) 04/29/2018 1311   TRIG 158 (H) 04/29/2018 1311   HDL 61 04/29/2018 1311   LDLCALC 123 (H) 04/29/2018 1311   Hepatic Function Panel     Component Value Date/Time   PROT 6.8 04/29/2018 1311   ALBUMIN 4.4 04/29/2018 1311   AST 17 04/29/2018 1311   ALT 20 04/29/2018 1311   ALKPHOS 114 04/29/2018 1311   BILITOT 0.3 04/29/2018 1311   BILIDIR 0.0 09/23/2013 0950      Component Value Date/Time   TSH 0.770 04/29/2018 1311   TSH 1.63 05/15/2016 1557   TSH 0.99 09/23/2013 0950    ASSESSMENT AND PLAN: Type 2 diabetes mellitus without complication, without long-term current use of insulin (HCC)  Class 1 obesity with serious comorbidity and body mass index (BMI) of 30.0 to 30.9 in adult, unspecified obesity type - Starting BMI greater then 30  PLAN:  Diabetes II Jillian Green has been given extensive diabetes education by myself today including ideal fasting and post-prandial blood glucose readings, individual ideal Hgb A1c goals and hypoglycemia prevention. We discussed the importance of good blood sugar control to decrease the likelihood of diabetic complications such as nephropathy, neuropathy, limb loss, blindness, coronary artery disease, and death. We discussed the importance of intensive lifestyle modification including diet, exercise and weight loss as the first line treatment for diabetes. Jillian Green agrees to continue taking metformin, diet, and exercise, and she is to check fasting BGs in the morning and bring in her log to next visit. Jillian Green agrees to follow up with our clinic in 2 weeks.  I spent > than 50% of  the 15 minute visit on counseling as documented in the note.  Obesity Jillian Green is currently in the action stage of change. As such, her goal is to continue with weight loss efforts She has agreed to follow the Category 3 plan Jillian Green has been instructed to work up to a goal of 150 minutes of combined cardio and strengthening exercise per week for weight loss and overall health benefits. We discussed the following Behavioral Modification Strategies today: work on meal planning and easy cooking plans and planning for success   Jillian Green has agreed to follow up with our clinic in 2 weeks. She was informed of the importance of frequent follow up visits to maximize her success with intensive lifestyle modifications for her multiple health conditions.   OBESITY BEHAVIORAL INTERVENTION VISIT  Today's visit was # 4   Starting weight: 184 lbs Starting date: 04/29/18 Today's weight : 173 lbs  Today's date: 06/14/2018 Total lbs lost to date: 11    ASK: We discussed the diagnosis of obesity with Jillian Green today and Jillian Green agreed to give us permission to discuss obesity behavioral modification therapy today.  ASSESS: Jillian Green has the diagnosis of obesity and her BMI today is 28.79 Jillian Green is in the action stage of change   ADVISE: Jillian Green was educated on the multiple health risks of obesity as well as the benefit of weight loss to improve her health. She was advised of the need for long term treatment and the importance of lifestyle modifications.  AGREE: Multiple dietary modification options and treatment options were discussed and  Jillian Green agreed to the above obesity treatment plan.  Jillian McburneyI, Sharon Martin, am acting as transcriptionist for Alois Clicheracey Aleksander Edmiston, PA-C\ I, Alois Clicheracey Nora Sabey, PA-C have reviewed above note and agree with its content

## 2018-06-29 ENCOUNTER — Other Ambulatory Visit (INDEPENDENT_AMBULATORY_CARE_PROVIDER_SITE_OTHER): Payer: Self-pay | Admitting: Family Medicine

## 2018-06-29 DIAGNOSIS — E559 Vitamin D deficiency, unspecified: Secondary | ICD-10-CM

## 2018-06-30 ENCOUNTER — Ambulatory Visit (INDEPENDENT_AMBULATORY_CARE_PROVIDER_SITE_OTHER): Payer: BC Managed Care – PPO | Admitting: Physician Assistant

## 2018-06-30 ENCOUNTER — Encounter (INDEPENDENT_AMBULATORY_CARE_PROVIDER_SITE_OTHER): Payer: Self-pay | Admitting: Physician Assistant

## 2018-06-30 VITALS — BP 126/82 | HR 97 | Temp 98.1°F | Ht 65.0 in | Wt 172.0 lb

## 2018-06-30 DIAGNOSIS — E119 Type 2 diabetes mellitus without complications: Secondary | ICD-10-CM

## 2018-06-30 DIAGNOSIS — E669 Obesity, unspecified: Secondary | ICD-10-CM | POA: Diagnosis not present

## 2018-06-30 DIAGNOSIS — Z6830 Body mass index (BMI) 30.0-30.9, adult: Secondary | ICD-10-CM

## 2018-07-05 ENCOUNTER — Other Ambulatory Visit: Payer: Self-pay | Admitting: Family Medicine

## 2018-07-05 DIAGNOSIS — M797 Fibromyalgia: Secondary | ICD-10-CM

## 2018-07-07 NOTE — Progress Notes (Signed)
Office: [phone]  /  Fax: [phone]   HPI:   Chief Complaint: OBESITY Jillian Green is here to discuss her progress with her obesity treatment plan. She is following the Category 3 plan and is following her eating plan approximately 85 % of the time. She states she is exercising 0 minutes 0 times per week. Jillian Green did well with weight loss. She reports that she has been eating out at times but would like to start eating more at home. She is bored with breakfast.  Her weight is 172 lb (78 kg) today and has had a weight loss of 1 pounds over a period of 3 weeks since her last visit. She has lost 12 lbs since starting treatment with [place].  Diabetes II Jillian Green has a diagnosis of diabetes type II. Jillian Green states that she is not checking her blood sugars. She denies any hypoglycemic episodes. She is taking Metformin and she denies nausea, vomiting and diarrhea. Lisinopril is causing her to cough. Last A1c was Hemoglobin A1C Latest Ref Rng & Units 04/29/2018  HGBA1C 4.8 - 5.6 % 7.7(H)  Some recent data might be hidden    She has been working on intensive lifestyle modifications including diet, exercise, and weight loss to help control her blood glucose levels.  ALLERGIES: Allergies  Allergen Reactions  . Augmentin [Amoxicillin-Pot Clavulanate]     Severe yeast infections    MEDICATIONS: Current Outpatient Medications on File Prior to Visit  Medication Sig Dispense Refill  . ALPRAZolam (XANAX) 0.5 MG tablet Take 1 tablet (0.5 mg total) by mouth 3 (three) times daily as needed for anxiety. 45 tablet 1  . atorvastatin (LIPITOR) 10 MG tablet TAKE 1 TABLET BY MOUTH EVERY DAY 30 tablet 0  . atorvastatin (LIPITOR) 10 MG tablet TAKE 1 TABLET BY MOUTH EVERY DAY 30 tablet 0  . azelastine (OPTIVAR) 0.05 % ophthalmic solution Place 1 drop into both eyes 2 (two) times daily as needed. 18 mL 1  . buPROPion (WELLBUTRIN XL) 150 MG 24 hr tablet TAKE 1 TABLET BY MOUTH EVERY DAY 90 tablet 1  . levocetirizine  (XYZAL) 5 MG tablet Take 1 tablet (5 mg total) by mouth every evening. 90 tablet 1  . lisdexamfetamine (VYVANSE) 50 MG capsule Take 1 capsule (50 mg total) by mouth daily. 30 capsule 0  . metFORMIN (GLUCOPHAGE) 500 MG tablet TAKE 1 TABLET BY MOUTH EVERY DAY WITH BREAKFAST 30 tablet 0  . metFORMIN (GLUCOPHAGE) 500 MG tablet TAKE 1 TABLET BY MOUTH EVERY DAY WITH BREAKFAST 30 tablet 0  . norethindrone-ethinyl estradiol (JUNEL 1/20) 1-20 MG-MCG tablet Take 1 tablet by mouth daily. Takes continuously    . omeprazole (PRILOSEC) 20 MG capsule Take 1 capsule (20 mg total) by mouth daily. 90 capsule 0  . rizatriptan (MAXALT-MLT) 10 MG disintegrating tablet TAKE 1 TABLET (10 MG TOTAL) BY MOUTH AS NEEDED FOR MIGRAINE. MAY REPEAT IN 2 HOURS IF NEEDED 10 tablet 0  . Vitamin D, Ergocalciferol, (DRISDOL) 1.25 MG (50000 UT) CAPS capsule TAKE 1 CAPSULE (50,000 UNITS TOTAL) BY MOUTH EVERY 7 (SEVEN) DAYS. 4 capsule 0   No current facility-administered medications on file prior to visit.     PAST MEDICAL HISTORY: Past Medical History:  Diagnosis Date  . ADD (attention deficit disorder)   . Anxiety   . Back pain   . Chronic headaches   . Depression   . Fibromyalgia   . GERD (gastroesophageal reflux disease)   . Itchy skin   . Joint pain   .  Lactose intolerance   . Migraine   . Sweating increase   . Unspecified eustachian tube disorder     PAST SURGICAL HISTORY: Past Surgical History:  Procedure Laterality Date  . CESAREAN SECTION    . OTHER SURGICAL HISTORY     preforated uterine surgery  . SINUS EXPLORATION      SOCIAL HISTORY: Social History   Tobacco Use  . Smoking status: Former Smoker    Years: 8.00    Last attempt to quit: 05/15/2006    Years since quitting: 12.1  . Smokeless tobacco: Never Used  . Tobacco comment: 1 cig every 4 months  Substance Use Topics  . Alcohol use: Yes    Alcohol/week: 1.0 standard drinks    Types: 1 Glasses of wine per week  . Drug use: No    FAMILY  HISTORY: Family History  Problem Relation Age of Onset  . Other Father        Dyslexia  . Heart disease Father   . Hypertension Father   . Alcoholism Father   . Hypertension Mother   . Thyroid disease Mother   . Anxiety disorder Mother   . Sleep apnea Mother   . Obesity Mother   . ADD / ADHD Sister     ROS: Review of Systems  Constitutional: Positive for weight loss.  Gastrointestinal: Negative for diarrhea, nausea and vomiting.  Endo/Heme/Allergies:       Negative for hypoglycemia     PHYSICAL EXAM: Blood pressure 126/82, pulse 97, temperature 98.1 F (36.7 C), temperature source Oral, height 5\' 5"  (1.651 m), weight 172 lb (78 kg), SpO2 98 %. Body mass index is 28.62 kg/m. Physical Exam  Constitutional: She is oriented to person, place, and time. She appears well-developed and well-nourished.  Cardiovascular: Normal rate.  Pulmonary/Chest: Effort normal.  Musculoskeletal: Normal range of motion.  Neurological: She is alert and oriented to person, place, and time.  Skin: Skin is warm and dry.  Psychiatric: She has a normal mood and affect. Her behavior is normal.  Vitals reviewed.   RECENT LABS AND TESTS: BMET    Component Value Date/Time   NA 137 04/29/2018 1311   K 4.2 04/29/2018 1311   CL 98 04/29/2018 1311   CO2 23 04/29/2018 1311   GLUCOSE 132 (H) 04/29/2018 1311   GLUCOSE 187 (H) 10/23/2016 1348   BUN 9 04/29/2018 1311   CREATININE 0.63 04/29/2018 1311   CALCIUM 9.0 04/29/2018 1311   GFRNONAA 109 04/29/2018 1311   GFRAA 125 04/29/2018 1311   Lab Results  Component Value Date   HGBA1C 7.7 (H) 04/29/2018   Lab Results  Component Value Date   INSULIN 18.4 04/29/2018   CBC    Component Value Date/Time   WBC 6.8 04/29/2018 1311   WBC 8.0 10/23/2016 1348   RBC 4.44 04/29/2018 1311   RBC 4.31 10/23/2016 1348   HGB 13.7 04/29/2018 1311   HCT 42.3 04/29/2018 1311   PLT 305 10/23/2016 1348   MCV 95 04/29/2018 1311   MCH 30.9 04/29/2018 1311    MCH 31.8 10/23/2016 1348   MCHC 32.4 04/29/2018 1311   MCHC 33.9 10/23/2016 1348   RDW 12.8 04/29/2018 1311   LYMPHSABS 2.2 04/29/2018 1311   MONOABS 0.3 09/23/2013 0950   EOSABS 0.2 04/29/2018 1311   BASOSABS 0.0 04/29/2018 1311   Iron/TIBC/Ferritin/ %Sat    Component Value Date/Time   IRON 156 (H) 09/23/2013 0950   FERRITIN 103.5 09/23/2013 0950   IRONPCTSAT 38.0 09/23/2013  0950   Lipid Panel     Component Value Date/Time   CHOL 216 (H) 04/29/2018 1311   TRIG 158 (H) 04/29/2018 1311   HDL 61 04/29/2018 1311   LDLCALC 123 (H) 04/29/2018 1311   Hepatic Function Panel     Component Value Date/Time   PROT 6.8 04/29/2018 1311   ALBUMIN 4.4 04/29/2018 1311   AST 17 04/29/2018 1311   ALT 20 04/29/2018 1311   ALKPHOS 114 04/29/2018 1311   BILITOT 0.3 04/29/2018 1311   BILIDIR 0.0 09/23/2013 0950      Component Value Date/Time   TSH 0.770 04/29/2018 1311   TSH 1.63 05/15/2016 1557   TSH 0.99 09/23/2013 0950  Results for BASSHEVA, FLURY (MRN [id number]) as of 07/07/2018 13:05  Ref. Range 04/29/2018 13:11  Vitamin D, 25-Hydroxy Latest Ref Range: 30.0 - 100.0 ng/mL 24.0 (L)    ASSESSMENT AND PLAN: Type 2 diabetes mellitus without complication, without long-term current use of insulin (HCC)  Class 1 obesity with serious comorbidity and body mass index (BMI) of 30.0 to 30.9 in adult, unspecified obesity type - beginning BMI >30  PLAN: Diabetes II Jillian Green has been given extensive diabetes education by myself today including ideal fasting and post-prandial blood glucose readings, individual ideal HgA1c goals  and hypoglycemia prevention. We discussed the importance of good blood sugar control to decrease the likelihood of diabetic complications such as nephropathy, neuropathy, limb loss, blindness, coronary artery disease, and death. We discussed the importance of intensive lifestyle modification including diet, exercise and weight loss as the first line treatment for diabetes.  Jillian Green agrees to continue her diabetes medications and working on weight loss. She will stop the Lisinopril due to the cough. Jillian Green agrees to follow up in our office in 3 weeks.   Obesity Jillian Green is currently in the action stage of change. As such, her goal is to continue with weight loss efforts She has agreed to follow the Category 3 plan Jillian Green has been instructed to work up to a goal of 150 minutes of combined cardio and strengthening exercise per week for weight loss and overall health benefits. We discussed the following Behavioral Modification Stratagies today: work on meal planning and easy cooking plans and holiday eating strategies   Jillian Green has agreed to follow up with our clinic in 3 weeks. She was informed of the importance of frequent follow up visits to maximize her success with intensive lifestyle modifications for her multiple health conditions.   OBESITY BEHAVIORAL INTERVENTION VISIT  Today's visit was # 5   Starting weight: 184 lbs Starting date: 04/29/2018 Today's weight : Weight: 172 lb (78 kg)  Today's date: 06/30/2018 Total lbs lost to date: 12 lbs I spent > than 50% of the 15 minute visit on counseling as documented in the note.    ASK: We discussed the diagnosis of obesity with Jillian Green today and Jillian Green agreed to give [place] permission to discuss obesity behavioral modification therapy today.  ASSESS: Jillian Green has the diagnosis of obesity and her BMI today is 28.62 Jillian Green is in the action stage of change   ADVISE: Jillian Green was educated on the multiple health risks of obesity as well as the benefit of weight loss to improve her health. She was advised of the need for long term treatment and the importance of lifestyle modifications to improve her current health and to decrease her risk of future health problems.  AGREE: Multiple dietary modification options and treatment options were discussed and  Jillian Green  agreed to follow the recommendations documented in the above  note.  ARRANGE: Jillian Green was educated on the importance of frequent visits to treat obesity as outlined per CMS and USPSTF guidelines and agreed to schedule her next follow up appointment today.  I, Ellery Plunk, CMA, am acting as transcriptionist for Alois Cliche, PA-C I, Alois Cliche, PA-C have reviewed above note and agree with its content

## 2018-07-09 ENCOUNTER — Encounter: Payer: Self-pay | Admitting: Family Medicine

## 2018-07-12 ENCOUNTER — Telehealth: Payer: Self-pay | Admitting: Family Medicine

## 2018-07-12 ENCOUNTER — Other Ambulatory Visit: Payer: Self-pay | Admitting: Family Medicine

## 2018-07-12 DIAGNOSIS — F909 Attention-deficit hyperactivity disorder, unspecified type: Secondary | ICD-10-CM

## 2018-07-12 DIAGNOSIS — F988 Other specified behavioral and emotional disorders with onset usually occurring in childhood and adolescence: Secondary | ICD-10-CM

## 2018-07-12 MED ORDER — LISDEXAMFETAMINE DIMESYLATE 50 MG PO CAPS: 50.0000 mg | ORAL_CAPSULE | Freq: Every day | ORAL | 0 refills | Status: DC

## 2018-07-12 NOTE — Telephone Encounter (Signed)
Copied from CRM [id number]. Topic: Quick Communication - Rx Refill/Question >> Jul 12, 2018  9:13 AM Gaynelle AduPoole, Shalonda wrote: Medication: lisdexamfetamine (VYVANSE) 50 MG capsule     Has the patient contacted their pharmacy? {yes   Preferred Pharmacy (with phone number or street name): [id number] [place], [place] - [address] [phone] (Phone) [phone] (Fax)    Agent: Please be advised that RX refills may take up to 3 business days. We ask that you follow-up with your pharmacy.

## 2018-07-13 NOTE — Telephone Encounter (Signed)
Refilled yesterday. 

## 2018-07-14 ENCOUNTER — Other Ambulatory Visit (INDEPENDENT_AMBULATORY_CARE_PROVIDER_SITE_OTHER): Payer: Self-pay | Admitting: Family Medicine

## 2018-07-14 ENCOUNTER — Other Ambulatory Visit: Payer: Self-pay | Admitting: Family Medicine

## 2018-07-14 DIAGNOSIS — F909 Attention-deficit hyperactivity disorder, unspecified type: Secondary | ICD-10-CM

## 2018-07-14 DIAGNOSIS — E119 Type 2 diabetes mellitus without complications: Secondary | ICD-10-CM

## 2018-07-14 MED ORDER — LISDEXAMFETAMINE DIMESYLATE 50 MG PO CAPS: 50.0000 mg | ORAL_CAPSULE | Freq: Every day | ORAL | 0 refills | Status: DC

## 2018-07-14 NOTE — Telephone Encounter (Signed)
Can you send RX for December per pt's request below? Refill history in Epic shows 2 RXs were sent on 07/12/18 that said do not fill until January and do not fill until February. Did not look like on actually went through for December. Please advise?

## 2018-07-14 NOTE — Telephone Encounter (Signed)
[facility] states that [facility] told [facility] this morning that they only have January and February's scripts for lisdexamfetamine (VYVANSE) 50 MG capsule and not December. Please Advise.

## 2018-07-19 NOTE — Telephone Encounter (Signed)
Looks like patient was taken off of lisinopril, will need to call patient.

## 2018-07-20 ENCOUNTER — Other Ambulatory Visit: Payer: Self-pay | Admitting: Family Medicine

## 2018-07-20 NOTE — Telephone Encounter (Signed)
Left message on machine that medication was discontinued by [facility] due to side effects.  Med decline at this.  Ask patient to call if any questions.

## 2018-07-21 ENCOUNTER — Ambulatory Visit (INDEPENDENT_AMBULATORY_CARE_PROVIDER_SITE_OTHER): Payer: BC Managed Care – PPO | Admitting: Physician Assistant

## 2018-07-21 ENCOUNTER — Encounter (INDEPENDENT_AMBULATORY_CARE_PROVIDER_SITE_OTHER): Payer: Self-pay | Admitting: Physician Assistant

## 2018-07-21 ENCOUNTER — Other Ambulatory Visit (HOSPITAL_BASED_OUTPATIENT_CLINIC_OR_DEPARTMENT_OTHER): Payer: Self-pay | Admitting: Obstetrics and Gynecology

## 2018-07-21 VITALS — BP 112/75 | HR 84 | Temp 97.9°F | Ht 65.0 in | Wt 174.0 lb

## 2018-07-21 DIAGNOSIS — E7849 Other hyperlipidemia: Secondary | ICD-10-CM | POA: Diagnosis not present

## 2018-07-21 DIAGNOSIS — E119 Type 2 diabetes mellitus without complications: Secondary | ICD-10-CM

## 2018-07-21 DIAGNOSIS — Z1231 Encounter for screening mammogram for malignant neoplasm of breast: Secondary | ICD-10-CM

## 2018-07-21 DIAGNOSIS — Z6830 Body mass index (BMI) 30.0-30.9, adult: Secondary | ICD-10-CM

## 2018-07-21 DIAGNOSIS — Z9189 Other specified personal risk factors, not elsewhere classified: Secondary | ICD-10-CM

## 2018-07-21 DIAGNOSIS — E669 Obesity, unspecified: Secondary | ICD-10-CM

## 2018-07-21 MED ORDER — LANCETS MISC: 0 refills | Status: DC

## 2018-07-22 NOTE — Progress Notes (Signed)
Office: [phone]  /  Fax: [phone]   HPI:   Chief Complaint: OBESITY Jillian Green is here to discuss her progress with her obesity treatment plan. She is on the Category 3 plan and is following her eating plan approximately 75 % of the time. She states she is exercising 0 minutes 0 times per week. Jillian Green reports that she has been eating coconut or soy yogurt, which has added sugar. Her blood sugars have been higher than usual and she is feeling more fatigued than normal. Her weight is 174 lb (78.9 kg) today and has had a weight gain of 2 pounds over a period of 3 weeks since her last visit. She has lost 10 lbs since starting treatment with Jillian Green.  Diabetes II Jillian Green has a diagnosis of diabetes type II. Jillian Green reports polydipsia and polyuria and she also notes an increase in sweet cravings. Last A1c was at 7.7 She has been working on intensive lifestyle modifications including diet, exercise, and weight loss to help control her blood glucose levels.  Hyperlipidemia Jillian Green has hyperlipidemia and she is on atorvastatin. She has been trying to improve her cholesterol levels with intensive lifestyle modification including a low saturated fat diet, exercise and weight loss. She denies any chest pain.  At risk for cardiovascular disease Jillian Green is at a higher than average risk for cardiovascular disease due to obesity and hyperlipidemia. She currently denies any chest pain.  ASSESSMENT AND PLAN:  Type 2 diabetes mellitus without complication, without long-term current use of insulin (HCC) - Plan: Lancets MISC  Other hyperlipidemia  At risk for heart disease  Class 1 obesity with serious comorbidity and body mass index (BMI) of 30.0 to 30.9 in adult, unspecified obesity type - Starting BMI greater then 30  PLAN:  Diabetes II Jillian Green has been given extensive diabetes education by myself today including ideal fasting and post-prandial blood glucose readings, individual ideal Hgb A1c goals and  hypoglycemia prevention. We discussed the importance of good blood sugar control to decrease the likelihood of diabetic complications such as nephropathy, neuropathy, limb loss, blindness, coronary artery disease, and death. We discussed the importance of intensive lifestyle modification including diet, exercise and weight loss as the first line treatment for diabetes. Jillian Green agrees to check her blood sugar twice daily #60 lancets #60 strips (for One Touch) with no refills and follow up as directed.  Hyperlipidemia Jillian Green was informed of the American Heart Association Guidelines emphasizing intensive lifestyle modifications as the first line treatment for hyperlipidemia. We discussed many lifestyle modifications today in depth, and Jillian Green will continue to work on decreasing saturated fats such as fatty red meat, butter and many fried foods. She will also increase vegetables and lean protein in her diet and continue to work on exercise and weight loss efforts. Jillian Green will continue her medications as prescribed and follow up as directed.  Cardiovascular risk counseling Jillian Green was given extended (15 minutes) coronary artery disease prevention counseling today. She is 47 y.o. female and has risk factors for heart disease including obesity and hyperlipidemia. We discussed intensive lifestyle modifications today with an emphasis on specific weight loss instructions and strategies. Pt was also informed of the importance of increasing exercise and decreasing saturated fats to help prevent heart disease.  Obesity Jillian Green is currently in the action stage of change. As such, her goal is to continue with weight loss efforts She has agreed to follow the Category 3 plan Jillian Green has been instructed to work up to a goal of 150 minutes  of combined cardio and strengthening exercise per week for weight loss and overall health benefits. We discussed the following Behavioral Modification Strategies today: work on meal planning and  easy cooking plans and holiday eating strategies   Jillian Green has agreed to follow up with our clinic in 3 weeks. She was informed of the importance of frequent follow up visits to maximize her success with intensive lifestyle modifications for her multiple health conditions.  ALLERGIES: Allergies  Allergen Reactions  . Augmentin [Amoxicillin-Pot Clavulanate]     Severe yeast infections    MEDICATIONS: Current Outpatient Medications on File Prior to Visit  Medication Sig Dispense Refill  . ALPRAZolam (XANAX) 0.5 MG tablet Take 1 tablet (0.5 mg total) by mouth 3 (three) times daily as needed for anxiety. 45 tablet 1  . atorvastatin (LIPITOR) 10 MG tablet TAKE 1 TABLET BY MOUTH EVERY DAY 30 tablet 0  . atorvastatin (LIPITOR) 10 MG tablet TAKE 1 TABLET BY MOUTH EVERY DAY 30 tablet 0  . azelastine (OPTIVAR) 0.05 % ophthalmic solution Place 1 drop into both eyes 2 (two) times daily as needed. 18 mL 1  . buPROPion (WELLBUTRIN XL) 150 MG 24 hr tablet TAKE 1 TABLET BY MOUTH EVERY DAY 90 tablet 1  . DULoxetine (CYMBALTA) 60 MG capsule TAKE 1 CAPSULE BY MOUTH TWICE A DAY 180 capsule 1  . levocetirizine (XYZAL) 5 MG tablet Take 1 tablet (5 mg total) by mouth every evening. 90 tablet 1  . lisdexamfetamine (VYVANSE) 50 MG capsule Take 1 capsule (50 mg total) by mouth daily. 30 capsule 0  . lisdexamfetamine (VYVANSE) 50 MG capsule Take 1 capsule (50 mg total) by mouth daily. 30 capsule 0  . lisdexamfetamine (VYVANSE) 50 MG capsule Take 1 capsule (50 mg total) by mouth daily. 30 capsule 0  . metFORMIN (GLUCOPHAGE) 500 MG tablet TAKE 1 TABLET BY MOUTH EVERY DAY WITH BREAKFAST 30 tablet 0  . metFORMIN (GLUCOPHAGE) 500 MG tablet TAKE 1 TABLET BY MOUTH EVERY DAY WITH BREAKFAST 30 tablet 0  . norethindrone-ethinyl estradiol (JUNEL 1/20) 1-20 MG-MCG tablet Take 1 tablet by mouth daily. Takes continuously    . omeprazole (PRILOSEC) 20 MG capsule Take 1 capsule (20 mg total) by mouth daily. 90 capsule 0  .  rizatriptan (MAXALT-MLT) 10 MG disintegrating tablet TAKE 1 TABLET (10 MG TOTAL) BY MOUTH AS NEEDED FOR MIGRAINE. MAY REPEAT IN 2 HOURS IF NEEDED 10 tablet 0  . Vitamin D, Ergocalciferol, (DRISDOL) 1.25 MG (50000 UT) CAPS capsule TAKE 1 CAPSULE (50,000 UNITS TOTAL) BY MOUTH EVERY 7 (SEVEN) DAYS. 4 capsule 0   No current facility-administered medications on file prior to visit.     PAST MEDICAL HISTORY: Past Medical History:  Diagnosis Date  . ADD (attention deficit disorder)   . Anxiety   . Back pain   . Chronic headaches   . Depression   . Fibromyalgia   . GERD (gastroesophageal reflux disease)   . Itchy skin   . Joint pain   . Lactose intolerance   . Migraine   . Sweating increase   . Unspecified eustachian tube disorder     PAST SURGICAL HISTORY: Past Surgical History:  Procedure Laterality Date  . CESAREAN SECTION    . OTHER SURGICAL HISTORY     preforated uterine surgery  . SINUS EXPLORATION      SOCIAL HISTORY: Social History   Tobacco Use  . Smoking status: Former Smoker    Years: 8.00    Last attempt to quit: 05/15/2006  Years since quitting: 12.1  . Smokeless tobacco: Never Used  . Tobacco comment: 1 cig every 4 months  Substance Use Topics  . Alcohol use: Yes    Alcohol/week: 1.0 standard drinks    Types: 1 Glasses of wine per week  . Drug use: No    FAMILY HISTORY: Family History  Problem Relation Age of Onset  . Other Father        Dyslexia  . Heart disease Father   . Hypertension Father   . Alcoholism Father   . Hypertension Mother   . Thyroid disease Mother   . Anxiety disorder Mother   . Sleep apnea Mother   . Obesity Mother   . ADD / ADHD Sister     ROS: Review of Systems  Constitutional: Positive for malaise/fatigue. Negative for weight loss.  Cardiovascular: Negative for chest pain.  Genitourinary: Positive for frequency.  Endo/Heme/Allergies: Positive for polydipsia.       Positive for cravings    PHYSICAL EXAM: Blood  pressure 112/75, pulse 84, temperature 97.9 F (36.6 C), temperature source Oral, height 5\' 5"  (1.651 m), weight 174 lb (78.9 kg), SpO2 98 %. Body mass index is 28.96 kg/m. Physical Exam Vitals signs reviewed.  Constitutional:      Appearance: Normal appearance. She is well-developed. She is obese.  Cardiovascular:     Rate and Rhythm: Normal rate.  Pulmonary:     Effort: Pulmonary effort is normal.  Musculoskeletal: Normal range of motion.  Skin:    General: Skin is warm and dry.  Neurological:     Mental Status: She is alert and oriented to person, place, and time.  Psychiatric:        Mood and Affect: Mood normal.        Behavior: Behavior normal.     RECENT LABS AND TESTS: BMET    Component Value Date/Time   NA 137 04/29/2018 1311   K 4.2 04/29/2018 1311   CL 98 04/29/2018 1311   CO2 23 04/29/2018 1311   GLUCOSE 132 (H) 04/29/2018 1311   GLUCOSE 187 (H) 10/23/2016 1348   BUN 9 04/29/2018 1311   CREATININE 0.63 04/29/2018 1311   CALCIUM 9.0 04/29/2018 1311   GFRNONAA 109 04/29/2018 1311   GFRAA 125 04/29/2018 1311   Lab Results  Component Value Date   HGBA1C 7.7 (H) 04/29/2018   Lab Results  Component Value Date   INSULIN 18.4 04/29/2018   CBC    Component Value Date/Time   WBC 6.8 04/29/2018 1311   WBC 8.0 10/23/2016 1348   RBC 4.44 04/29/2018 1311   RBC 4.31 10/23/2016 1348   HGB 13.7 04/29/2018 1311   HCT 42.3 04/29/2018 1311   PLT 305 10/23/2016 1348   MCV 95 04/29/2018 1311   MCH 30.9 04/29/2018 1311   MCH 31.8 10/23/2016 1348   MCHC 32.4 04/29/2018 1311   MCHC 33.9 10/23/2016 1348   RDW 12.8 04/29/2018 1311   LYMPHSABS 2.2 04/29/2018 1311   MONOABS 0.3 09/23/2013 0950   EOSABS 0.2 04/29/2018 1311   BASOSABS 0.0 04/29/2018 1311   Iron/TIBC/Ferritin/ %Sat    Component Value Date/Time   IRON 156 (H) 09/23/2013 0950   FERRITIN 103.5 09/23/2013 0950   IRONPCTSAT 38.0 09/23/2013 0950   Lipid Panel     Component Value Date/Time   CHOL  216 (H) 04/29/2018 1311   TRIG 158 (H) 04/29/2018 1311   HDL 61 04/29/2018 1311   LDLCALC 123 (H) 04/29/2018 1311   Hepatic Function  Panel     Component Value Date/Time   PROT 6.8 04/29/2018 1311   ALBUMIN 4.4 04/29/2018 1311   AST 17 04/29/2018 1311   ALT 20 04/29/2018 1311   ALKPHOS 114 04/29/2018 1311   BILITOT 0.3 04/29/2018 1311   BILIDIR 0.0 09/23/2013 0950      Component Value Date/Time   TSH 0.770 04/29/2018 1311   TSH 1.63 05/15/2016 1557   TSH 0.99 09/23/2013 0950    Ref. Range 04/29/2018 13:11  Vitamin D, 25-Hydroxy Latest Ref Range: 30.0 - 100.0 ng/mL 24.0 (L)     OBESITY BEHAVIORAL INTERVENTION VISIT  Today's visit was # 6   Starting weight: 184 lbs Starting date: 04/29/2018 Today's weight : 174 lbs Today's date: 07/21/2018 Total lbs lost to date: 10   ASK: We discussed the diagnosis of obesity with Jillian Green today and Jillian Green agreed to give [place] permission to discuss obesity behavioral modification therapy today.  ASSESS: Jillian Green has the diagnosis of obesity and her BMI today is 28.96 Jillian Green is in the action stage of change   ADVISE: Jillian Green was educated on the multiple health risks of obesity as well as the benefit of weight loss to improve her health. She was advised of the need for long term treatment and the importance of lifestyle modifications to improve her current health and to decrease her risk of future health problems.  AGREE: Multiple dietary modification options and treatment options were discussed and  Jillian Green agreed to follow the recommendations documented in the above note.  ARRANGE: Heli was educated on the importance of frequent visits to treat obesity as outlined per CMS and USPSTF guidelines and agreed to schedule her next follow up appointment today.  Cristi Loron, am acting as transcriptionist for Alois Cliche, PA-C I, Alois Cliche, PA-C have reviewed above note and agree with its content

## 2018-07-23 ENCOUNTER — Other Ambulatory Visit: Payer: Self-pay | Admitting: Family Medicine

## 2018-07-23 DIAGNOSIS — J302 Other seasonal allergic rhinitis: Secondary | ICD-10-CM

## 2018-07-26 ENCOUNTER — Other Ambulatory Visit (INDEPENDENT_AMBULATORY_CARE_PROVIDER_SITE_OTHER): Payer: Self-pay | Admitting: Family Medicine

## 2018-07-26 ENCOUNTER — Other Ambulatory Visit: Payer: Self-pay | Admitting: Family Medicine

## 2018-07-26 DIAGNOSIS — E559 Vitamin D deficiency, unspecified: Secondary | ICD-10-CM

## 2018-07-26 MED ORDER — VITAMIN D (ERGOCALCIFEROL) 1.25 MG (50000 UNIT) PO CAPS: 50000.0000 [IU] | ORAL_CAPSULE | ORAL | 0 refills | Status: DC

## 2018-07-26 NOTE — Telephone Encounter (Signed)
Last vitamin d 04/29/18 was 24 done by [facility] and you did the last refill.  Do you want to continue.

## 2018-07-26 NOTE — Telephone Encounter (Signed)
Refill x1 and recheck vita d level--- I put order in

## 2018-08-02 ENCOUNTER — Other Ambulatory Visit (INDEPENDENT_AMBULATORY_CARE_PROVIDER_SITE_OTHER): Payer: Self-pay | Admitting: Family Medicine

## 2018-08-02 DIAGNOSIS — E7849 Other hyperlipidemia: Secondary | ICD-10-CM

## 2018-08-02 DIAGNOSIS — E119 Type 2 diabetes mellitus without complications: Secondary | ICD-10-CM

## 2018-08-05 ENCOUNTER — Ambulatory Visit (HOSPITAL_BASED_OUTPATIENT_CLINIC_OR_DEPARTMENT_OTHER)
Admission: RE | Admit: 2018-08-05 | Discharge: 2018-08-05 | Disposition: A | Discharge status: Home / Self Care | Payer: BC Managed Care – PPO | Source: Ambulatory Visit | Attending: Obstetrics and Gynecology | Admitting: Obstetrics and Gynecology

## 2018-08-05 ENCOUNTER — Encounter: Payer: Self-pay | Admitting: Family Medicine

## 2018-08-05 ENCOUNTER — Ambulatory Visit: Payer: BC Managed Care – PPO | Admitting: Family Medicine

## 2018-08-05 VITALS — BP 116/80 | HR 105 | Temp 98.4°F | Resp 16 | Ht 65.0 in | Wt 175.8 lb

## 2018-08-05 DIAGNOSIS — Z79899 Other long term (current) drug therapy: Secondary | ICD-10-CM | POA: Diagnosis not present

## 2018-08-05 DIAGNOSIS — L719 Rosacea, unspecified: Secondary | ICD-10-CM

## 2018-08-05 DIAGNOSIS — M797 Fibromyalgia: Secondary | ICD-10-CM

## 2018-08-05 DIAGNOSIS — Z1231 Encounter for screening mammogram for malignant neoplasm of breast: Secondary | ICD-10-CM | POA: Insufficient documentation

## 2018-08-05 DIAGNOSIS — F909 Attention-deficit hyperactivity disorder, unspecified type: Secondary | ICD-10-CM | POA: Diagnosis not present

## 2018-08-05 MED ORDER — NONFORMULARY OR COMPOUNDED ITEM: 5 refills | Status: DC

## 2018-08-05 MED ORDER — METRONIDAZOLE 1 % EX GEL: Freq: Every day | CUTANEOUS | 0 refills | Status: DC

## 2018-08-05 MED ORDER — HYDROXYZINE HCL 25 MG PO TABS: ORAL_TABLET | ORAL | 0 refills | Status: DC

## 2018-08-05 NOTE — Patient Instructions (Signed)
Myofascial Pain Syndrome and Fibromyalgia Myofascial pain syndrome and fibromyalgia are both pain disorders. This pain may be felt mainly in your muscles.  Myofascial pain syndrome: ? Always has tender points in the muscle that will cause pain when pressed (trigger points). The pain may come and go. ? Usually affects your neck, upper back, and shoulder areas. The pain often radiates into your arms and hands.  Fibromyalgia: ? Has muscle pains and tenderness that come and go. ? Is often associated with fatigue and sleep problems. ? Has trigger points. ? Tends to be long-lasting (chronic), but is not life-threatening. Fibromyalgia and myofascial pain syndrome are not the same. However, they often occur together. If you have both conditions, each can make the other worse. Both are common and can cause enough pain and fatigue to make day-to-day activities difficult. Both can be hard to diagnose because their symptoms are common in many other conditions. What are the causes? The exact causes of these conditions are not known. What increases the risk? You are more likely to develop this condition if:  You have a family history of the condition.  You have certain triggers, such as: ? Spine disorders. ? An injury (trauma) or other physical stressors. ? Being under a lot of stress. ? Medical conditions such as osteoarthritis, rheumatoid arthritis, or lupus. What are the signs or symptoms? Fibromyalgia The main symptom of fibromyalgia is widespread pain and tenderness in your muscles. Pain is sometimes described as stabbing, shooting, or burning. You may also have:  Tingling or numbness.  Sleep problems and fatigue.  Problems with attention and concentration (fibro fog). Other symptoms may include:  Bowel and bladder problems.  Headaches.  Visual problems.  Problems with odors and noises.  Depression or mood changes.  Painful menstrual periods (dysmenorrhea).  Dry skin or eyes.  These symptoms can vary over time. Myofascial pain syndrome Symptoms of myofascial pain syndrome include:  Tight, ropy bands of muscle.  Uncomfortable sensations in muscle areas. These may include aching, cramping, burning, numbness, tingling, and weakness.  Difficulty moving certain parts of the body freely (poor range of motion). How is this diagnosed? This condition may be diagnosed by your symptoms and medical history. You will also have a physical exam. In general:  Fibromyalgia is diagnosed if you have pain, fatigue, and other symptoms for more than 3 months, and symptoms cannot be explained by another condition.  Myofascial pain syndrome is diagnosed if you have trigger points in your muscles, and those trigger points are tender and cause pain elsewhere in your body (referred pain). How is this treated? Treatment for these conditions depends on the type that you have.  For fibromyalgia: ? Pain medicines, such as NSAIDs. ? Medicines for treating depression. ? Medicines for treating seizures. ? Medicines that relax the muscles.  For myofascial pain: ? Pain medicines, such as NSAIDs. ? Cooling and stretching of muscles. ? Trigger point injections. ? Sound wave (ultrasound) treatments to stimulate muscles. Treating these conditions often requires a team of health care providers. These may include:  Your primary care provider.  Physical therapist.  Complementary health care providers, such as massage therapists or acupuncturists.  Psychiatrist for cognitive behavioral therapy. Follow these instructions at home: Medicines  Take over-the-counter and prescription medicines only as told by your health care provider.  Do not drive or use heavy machinery while taking prescription pain medicine.  If you are taking prescription pain medicine, take actions to prevent or treat constipation. Your health care   provider may recommend that you: ? Drink enough fluid to keep your  urine pale yellow. ? Eat foods that are high in fiber, such as fresh fruits and vegetables, whole grains, and beans. ? Limit foods that are high in fat and processed sugars, such as fried or sweet foods. ? Take an over-the-counter or prescription medicine for constipation. Lifestyle   Exercise as directed by your health care provider or physical therapist.  Practice relaxation techniques to control your stress. You may want to try: ? Biofeedback. ? Visual imagery. ? Hypnosis. ? Muscle relaxation. ? Yoga. ? Meditation.  Maintain a healthy lifestyle. This includes eating a healthy diet and getting enough sleep.  Do not use any products that contain nicotine or tobacco, such as cigarettes and e-cigarettes. If you need help quitting, ask your health care provider. General instructions  Talk to your health care provider about complementary treatments, such as acupuncture or massage.  Consider joining a support group with others who are diagnosed with this condition.  Do not do activities that stress or strain your muscles. This includes repetitive motions and heavy lifting.  Keep all follow-up visits as told by your health care provider. This is important. Where to find more information  National Fibromyalgia Association: www.fmaware.org  Arthritis Foundation: www.arthritis.org  American Chronic Pain Association: www.theacpa.org Contact a health care provider if:  You have new symptoms.  Your symptoms get worse or your pain is severe.  You have side effects from your medicines.  You have trouble sleeping.  Your condition is causing depression or anxiety. Summary  Myofascial pain syndrome and fibromyalgia are pain disorders.  Myofascial pain syndrome has tender points in the muscle that will cause pain when pressed (trigger points). Fibromyalgia also has muscle pains and tenderness that come and go, but this condition is often associated with fatigue and sleep  disturbances.  Fibromyalgia and myofascial pain syndrome are not the same but often occur together, causing pain and fatigue that make day-to-day activities difficult.  Treatment for fibromyalgia includes taking medicines to relax the muscles and medicines for pain, depression, or seizures. Treatment for myofascial pain syndrome includes taking medicines for pain, cooling and stretching of muscles, and injecting medicines into trigger points.  Follow your health care provider's instructions for taking medicines and maintaining a healthy lifestyle. This information is not intended to replace advice given to you by your health care provider. Make sure you discuss any questions you have with your health care provider. Document Released: 07/21/2005 Document Revised: 08/05/2017 Document Reviewed: 08/05/2017 [employer] Interactive Patient Education  2019 [employer]  

## 2018-08-05 NOTE — Progress Notes (Signed)
Patient ID: Jillian Green, female    DOB: 1971/02/06  Age: 48 y.o. MRN: [id number]    Subjective:  Subjective  HPI1 Jillian Green presents for f/u add -- she is doing well with ADD meds.  She c/o rash around mouth and cheeks ---  Her mom usual metrogel and she was hoping this would help her as well.    She is struggling with pain from fibro and admits to not exercising like she should because she is not sure what to do.    Review of Systems  Constitutional: Negative for appetite change, diaphoresis, fatigue and unexpected weight change.  Eyes: Negative for pain, redness and visual disturbance.  Respiratory: Negative for cough, chest tightness, shortness of breath and wheezing.   Cardiovascular: Negative for chest pain, palpitations and leg swelling.  Endocrine: Negative for cold intolerance, heat intolerance, polydipsia, polyphagia and polyuria.  Genitourinary: Negative for difficulty urinating, dysuria and frequency.  Musculoskeletal: Positive for arthralgias and myalgias.  Neurological: Negative for dizziness, light-headedness, numbness and headaches.    History Past Medical History:  Diagnosis Date  . ADD (attention deficit disorder)   . Anxiety   . Back pain   . Chronic headaches   . Depression   . Fibromyalgia   . GERD (gastroesophageal reflux disease)   . Itchy skin   . Joint pain   . Lactose intolerance   . Migraine   . Sweating increase   . Unspecified eustachian tube disorder     She has a past surgical history that includes Sinus exploration; Cesarean section; and OTHER SURGICAL HISTORY.   Her family history includes ADD / ADHD in her sister; Alcoholism in her father; Anxiety disorder in her mother; Heart disease in her father; Hypertension in her father and mother; Obesity in her mother; Other in her father; Sleep apnea in her mother; Thyroid disease in her mother.She reports that she quit smoking about 12 years ago. She quit after 8.00 years of use. She  has never used smokeless tobacco. She reports current alcohol use of about 1.0 standard drinks of alcohol per week. She reports that she does not use drugs.  Current Outpatient Medications on File Prior to Visit  Medication Sig Dispense Refill  . ALPRAZolam (XANAX) 0.5 MG tablet Take 1 tablet (0.5 mg total) by mouth 3 (three) times daily as needed for anxiety. 45 tablet 1  . atorvastatin (LIPITOR) 10 MG tablet TAKE 1 TABLET BY MOUTH EVERY DAY 90 tablet 1  . azelastine (OPTIVAR) 0.05 % ophthalmic solution Place 1 drop into both eyes 2 (two) times daily as needed. 18 mL 1  . buPROPion (WELLBUTRIN XL) 150 MG 24 hr tablet TAKE 1 TABLET BY MOUTH EVERY DAY 90 tablet 1  . DULoxetine (CYMBALTA) 60 MG capsule TAKE 1 CAPSULE BY MOUTH TWICE A DAY 180 capsule 1  . levocetirizine (XYZAL) 5 MG tablet TAKE 1 TABLET BY MOUTH EVERY DAY IN THE EVENING 90 tablet 1  . lisdexamfetamine (VYVANSE) 50 MG capsule Take 1 capsule (50 mg total) by mouth daily. 30 capsule 0  . lisdexamfetamine (VYVANSE) 50 MG capsule Take 1 capsule (50 mg total) by mouth daily. 30 capsule 0  . lisdexamfetamine (VYVANSE) 50 MG capsule Take 1 capsule (50 mg total) by mouth daily. 30 capsule 0  . metFORMIN (GLUCOPHAGE) 500 MG tablet TAKE 1 TABLET BY MOUTH EVERY DAY WITH BREAKFAST 90 tablet 1  . omeprazole (PRILOSEC) 20 MG capsule Take 1 capsule (20 mg total) by mouth daily. 90 capsule  0  . rizatriptan (MAXALT-MLT) 10 MG disintegrating tablet TAKE 1 TABLET (10 MG TOTAL) BY MOUTH AS NEEDED FOR MIGRAINE. MAY REPEAT IN 2 HOURS IF NEEDED 10 tablet 0  . TRI-LO-MARZIA 0.18/0.215/0.25 MG-25 MCG tab Take 1 tablet by mouth daily.  3  . Vitamin D, Ergocalciferol, (DRISDOL) 1.25 MG (50000 UT) CAPS capsule Take 1 capsule (50,000 Units total) by mouth every 7 (seven) days. 4 capsule 0   No current facility-administered medications on file prior to visit.      Objective:  Objective  Physical Exam Vitals signs and nursing note reviewed.  Constitutional:       General: She is not in acute distress.    Appearance: She is well-developed.  HENT:     Head: Normocephalic and atraumatic.     Right Ear: External ear normal.     Left Ear: External ear normal.     Nose: Nose normal.  Eyes:     Conjunctiva/sclera: Conjunctivae normal.     Pupils: Pupils are equal, round, and reactive to light.  Neck:     Musculoskeletal: Normal range of motion and neck supple.     Thyroid: No thyromegaly.     Vascular: No carotid bruit or JVD.  Cardiovascular:     Rate and Rhythm: Normal rate and regular rhythm.     Heart sounds: Normal heart sounds. No murmur.  Pulmonary:     Effort: Pulmonary effort is normal. No respiratory distress.     Breath sounds: Normal breath sounds. No wheezing or rales.  Chest:     Chest wall: No tenderness.  Skin:    Findings: Erythema and rash present.  Neurological:     Mental Status: She is alert and oriented to person, place, and time.  Psychiatric:        Behavior: Behavior normal.        Thought Content: Thought content normal.        Judgment: Judgment normal.    BP 116/80 (BP Location: Right Arm, Cuff Size: Normal)   Pulse (!) 105   Temp 98.4 F (36.9 C) (Oral)   Resp 16   Ht 5\' 5"  (1.651 m)   Wt 175 lb 12.8 oz (79.7 kg)   SpO2 98%   BMI 29.25 kg/m  Wt Readings from Last 3 Encounters:  08/05/18 175 lb 12.8 oz (79.7 kg)  07/21/18 174 lb (78.9 kg)  06/30/18 172 lb (78 kg)     Lab Results  Component Value Date   WBC 6.8 04/29/2018   HGB 13.7 04/29/2018   HCT 42.3 04/29/2018   PLT 305 10/23/2016   GLUCOSE 132 (H) 04/29/2018   CHOL 216 (H) 04/29/2018   TRIG 158 (H) 04/29/2018   HDL 61 04/29/2018   LDLCALC 123 (H) 04/29/2018   ALT 20 04/29/2018   AST 17 04/29/2018   NA 137 04/29/2018   K 4.2 04/29/2018   CL 98 04/29/2018   CREATININE 0.63 04/29/2018   BUN 9 04/29/2018   CO2 23 04/29/2018   TSH 0.770 04/29/2018   HGBA1C 7.7 (H) 04/29/2018    Dg Chest 2 View  Result Date: 10/23/2016 CLINICAL  DATA:  Weakness and chest pain.  Diaphoresis EXAM: CHEST  2 VIEW COMPARISON:  None. FINDINGS: The heart size and mediastinal contours are within normal limits. Both lungs are clear. The visualized skeletal structures are unremarkable. IMPRESSION: No active cardiopulmonary disease. Electronically Signed   By: Signa Kellaylor  Stroud M.D.   On: 10/23/2016 14:09     Assessment &  Plan:  Plan  I have discontinued Angenette L. Cisse's norethindrone-ethinyl estradiol and Lancets. I am also having her start on metroNIDAZOLE, hydrOXYzine, and NONFORMULARY OR COMPOUNDED ITEM. Additionally, I am having her maintain her omeprazole, ALPRAZolam, azelastine, rizatriptan, DULoxetine, lisdexamfetamine, lisdexamfetamine, lisdexamfetamine, buPROPion, levocetirizine, Vitamin D (Ergocalciferol), metFORMIN, atorvastatin, and TRI-LO-MARZIA.  Meds ordered this encounter  Medications  . metroNIDAZOLE (METROGEL) 1 % gel    Sig: Apply topically daily.    Dispense:  45 g    Refill:  0  . hydrOXYzine (ATARAX/VISTARIL) 25 MG tablet    Sig: 1-2 po qhs prn sleep    Dispense:  30 tablet    Refill:  0  . NONFORMULARY OR COMPOUNDED ITEM    Sig: Tiger balm patches 1 patch daily prn    Dispense:  1 each    Refill:  5    Problem List Items Addressed This Visit      Unprioritized   ADD (attention deficit disorder) - Primary   Relevant Orders   Pain Mgmt, Profile 8 w/Conf, U   Fibromyalgia   Relevant Orders   Ambulatory referral to Physical Therapy    Other Visit Diagnoses    High risk medication use       Relevant Orders   Pain Mgmt, Profile 8 w/Conf, U   Rosacea       Relevant Medications   metroNIDAZOLE (METROGEL) 1 % gel      Follow-up: Return in about 6 months (around 02/03/2019), or if symptoms worsen or fail to improve.  Donato SchultzYvonne R Lowne Chase, DO

## 2018-08-07 LAB — PAIN MGMT, PROFILE 8 W/CONF, U
6 Acetylmorphine: NEGATIVE ng/mL (ref <10)
Alcohol Metabolites: NEGATIVE ng/mL (ref <500)
Amphetamine: 5845 ng/mL — ABNORMAL HIGH (ref <250)
Amphetamines: POSITIVE ng/mL — AB (ref <500)
Benzodiazepines: NEGATIVE ng/mL (ref <100)
Buprenorphine, Urine: NEGATIVE ng/mL (ref <5)
Cocaine Metabolite: NEGATIVE ng/mL (ref <150)
Creatinine: 70.5 mg/dL
MDMA: NEGATIVE ng/mL (ref <500)
METHAMPHETAMINE: NEGATIVE ng/mL (ref <250)
Marijuana Metabolite: NEGATIVE ng/mL (ref <20)
Opiates: NEGATIVE ng/mL (ref <100)
Oxidant: NEGATIVE ug/mL (ref <200)
Oxycodone: NEGATIVE ng/mL (ref <100)
pH: 7.24 (ref 4.5–9.0)

## 2018-08-09 ENCOUNTER — Encounter (INDEPENDENT_AMBULATORY_CARE_PROVIDER_SITE_OTHER): Payer: Self-pay | Admitting: Physician Assistant

## 2018-08-10 ENCOUNTER — Other Ambulatory Visit (INDEPENDENT_AMBULATORY_CARE_PROVIDER_SITE_OTHER): Payer: Self-pay

## 2018-08-10 DIAGNOSIS — E119 Type 2 diabetes mellitus without complications: Secondary | ICD-10-CM

## 2018-08-10 MED ORDER — GLUCOSE BLOOD VI STRP: ORAL_STRIP | 12 refills | Status: DC

## 2018-08-10 MED ORDER — NONFORMULARY OR COMPOUNDED ITEM: 5 refills | Status: DC

## 2018-08-16 ENCOUNTER — Ambulatory Visit (INDEPENDENT_AMBULATORY_CARE_PROVIDER_SITE_OTHER): Payer: BC Managed Care – PPO | Admitting: Family Medicine

## 2018-08-16 ENCOUNTER — Other Ambulatory Visit (INDEPENDENT_AMBULATORY_CARE_PROVIDER_SITE_OTHER): Payer: Self-pay

## 2018-08-16 ENCOUNTER — Encounter (INDEPENDENT_AMBULATORY_CARE_PROVIDER_SITE_OTHER): Payer: Self-pay | Admitting: Physician Assistant

## 2018-08-16 DIAGNOSIS — E119 Type 2 diabetes mellitus without complications: Secondary | ICD-10-CM

## 2018-08-16 MED ORDER — GLUCOSE BLOOD VI STRP: ORAL_STRIP | 0 refills | Status: DC

## 2018-08-16 MED ORDER — ONETOUCH DELICA LANCETS 30G MISC: 1.0000 | Freq: Every day | 0 refills | Status: DC

## 2018-08-18 ENCOUNTER — Telehealth: Payer: Self-pay | Admitting: *Deleted

## 2018-08-18 NOTE — Telephone Encounter (Signed)
Received Physician Orders from [facility]; forwarded to provider/SLS 01/15

## 2018-08-25 ENCOUNTER — Other Ambulatory Visit: Payer: Self-pay | Admitting: Family Medicine

## 2018-08-27 ENCOUNTER — Other Ambulatory Visit: Payer: Self-pay | Admitting: Family Medicine

## 2018-08-27 DIAGNOSIS — E559 Vitamin D deficiency, unspecified: Secondary | ICD-10-CM

## 2018-08-31 ENCOUNTER — Other Ambulatory Visit: Payer: Self-pay | Admitting: Family Medicine

## 2018-09-01 ENCOUNTER — Ambulatory Visit (INDEPENDENT_AMBULATORY_CARE_PROVIDER_SITE_OTHER): Payer: BC Managed Care – PPO | Admitting: Family Medicine

## 2018-09-01 ENCOUNTER — Encounter (INDEPENDENT_AMBULATORY_CARE_PROVIDER_SITE_OTHER): Payer: Self-pay

## 2018-09-13 ENCOUNTER — Ambulatory Visit (INDEPENDENT_AMBULATORY_CARE_PROVIDER_SITE_OTHER): Payer: BC Managed Care – PPO | Admitting: Bariatrics

## 2018-09-13 ENCOUNTER — Encounter (INDEPENDENT_AMBULATORY_CARE_PROVIDER_SITE_OTHER): Payer: Self-pay | Admitting: Bariatrics

## 2018-09-13 VITALS — BP 120/81 | HR 77 | Temp 98.1°F | Ht 65.0 in | Wt 172.0 lb

## 2018-09-13 DIAGNOSIS — E559 Vitamin D deficiency, unspecified: Secondary | ICD-10-CM

## 2018-09-13 DIAGNOSIS — Z9189 Other specified personal risk factors, not elsewhere classified: Secondary | ICD-10-CM | POA: Diagnosis not present

## 2018-09-13 DIAGNOSIS — E119 Type 2 diabetes mellitus without complications: Secondary | ICD-10-CM

## 2018-09-13 DIAGNOSIS — E7849 Other hyperlipidemia: Secondary | ICD-10-CM

## 2018-09-13 DIAGNOSIS — Z6830 Body mass index (BMI) 30.0-30.9, adult: Secondary | ICD-10-CM

## 2018-09-13 DIAGNOSIS — E669 Obesity, unspecified: Secondary | ICD-10-CM

## 2018-09-13 HISTORY — DX: Type 2 diabetes mellitus without complications: E11.9

## 2018-09-13 MED ORDER — ONETOUCH DELICA LANCETS 30G MISC: 1.0000 | Freq: Every day | 0 refills | Status: DC

## 2018-09-13 MED ORDER — METFORMIN HCL 500 MG PO TABS: 500.0000 mg | ORAL_TABLET | Freq: Two times a day (BID) | ORAL | 0 refills | Status: DC

## 2018-09-13 MED ORDER — GLUCOSE BLOOD VI STRP: ORAL_STRIP | 0 refills | Status: DC

## 2018-09-13 NOTE — Progress Notes (Signed)
Office: [phone]  /  Fax: [phone]   HPI:   Chief Complaint: OBESITY Jillian Green is here to discuss her progress with her obesity treatment plan. She is on the Category 3 plan and is following her eating plan approximately 60 % of the time. She states she is doing physical therapy and walking for 30 minutes 3 times per week. Jillian Green is doing well overall. She has hunger "all the time" and she is craving sweets. Her weight is 172 lb (78 kg) today and has had a weight loss of 2 pounds over a period of 7 weeks since her last visit. She has lost 12 lbs since starting treatment with [place].  Diabetes II Jillian Green has a diagnosis of diabetes type II. Jillian Green states fasting BGs range between 170 and 190's and she is not checking her 2 hour post prandial BGs. She denies any hypoglycemic episodes. Last A1c was at 7.7 She has been working on intensive lifestyle modifications including diet, exercise, and weight loss to help control her blood glucose levels.  Hyperlipidemia Jillian Green has hyperlipidemia and has he is currently taking Atorvastatin. She has been trying to improve her cholesterol levels with intensive lifestyle modification including a low saturated fat diet, exercise and weight loss. She denies myalgias.  Vitamin D deficiency Jillian Green has a diagnosis of vitamin D deficiency.  She is currently taking high dose vit D and denies nausea, vomiting or muscle weakness.  At risk for osteopenia and osteoporosis Jillian Green is at higher risk of osteopenia and osteoporosis due to vitamin D deficiency.   ASSESSMENT AND PLAN:  Type 2 diabetes mellitus without complication, without long-term current use of insulin (HCC) - Plan: Comprehensive metabolic panel, Hemoglobin A1c, Insulin, random, ONETOUCH DELICA LANCETS 30G MISC, metFORMIN (GLUCOPHAGE) 500 MG tablet, glucose blood test strip  Other hyperlipidemia - Plan: Lipid Panel With LDL/HDL Ratio  Vitamin D deficiency - Plan: VITAMIN D 25 Hydroxy (Vit-D Deficiency,  Fractures)  At risk for osteoporosis  Class 1 obesity with serious comorbidity and body mass index (BMI) of 30.0 to 30.9 in adult, unspecified obesity type - Starting BMI greater then 30  PLAN:  Diabetes II Jillian Green has been given extensive diabetes education by myself today including ideal fasting and post-prandial blood glucose readings, individual ideal Hgb A1c goals and hypoglycemia prevention. We discussed the importance of good blood sugar control to decrease the likelihood of diabetic complications such as nephropathy, neuropathy, limb loss, blindness, coronary artery disease, and death. We discussed the importance of intensive lifestyle modification including diet, exercise and weight loss as the first line treatment for diabetes. Jillian Green agrees to continue metformin 500 mg twice daily #60 with no refills, glucose blood strips (use BID) #100 strips with no refills, One Touch Delica lancets 30G using BID #100 with no refills and follow up as directed..  Hyperlipidemia Jillian Green was informed of the American Heart Association Guidelines emphasizing intensive lifestyle modifications as the first line treatment for hyperlipidemia. We discussed many lifestyle modifications today in depth, and Jillian Green will continue to work on decreasing saturated fats such as fatty red meat, butter and many fried foods. She will also increase vegetables and lean protein in her diet and continue to work on exercise and weight loss efforts. Jillian Green will continue Atorvastatin and follow up as directed.  Vitamin D Deficiency Jillian Green was informed that low vitamin D levels contributes to fatigue and are associated with obesity, breast, and colon cancer. She agrees to continue to take prescription Vit D @50 ,000 IU every week and  will follow up for routine testing of vitamin D, at least 2-3 times per year. She was informed of the risk of over-replacement of vitamin D and agrees to not increase her dose unless she discusses this with us  first. We will check vitamin D level today.  At risk for osteopenia and osteoporosis Jillian Green was given extended  (15 minutes) osteoporosis prevention counseling today. Jillian Green is at risk for osteopenia and osteoporosis due to her vitamin D deficiency. She was encouraged to take her vitamin D and follow her higher calcium diet and increase strengthening exercise to help strengthen her bones and decrease her risk of osteopenia and osteoporosis.  Obesity Jillian Green is currently in the action stage of change. As such, her goal is to continue with weight loss efforts She has agreed to follow the Category 3 plan Jillian Green has been instructed to work up to a goal of 150 minutes of combined cardio and strengthening exercise per week for weight loss and overall health benefits. We discussed the following Behavioral Modification Strategies today: increase H2O intake, no skipping meals, keeping healthy foods in the home, increasing lean protein intake, decreasing simple carbohydrates, increasing vegetables, decrease eating out and work on meal planning and easy cooking plans Additional lunch options were provided to patient today.  Jillian Green has agreed to follow up with our clinic in 2 weeks. She was informed of the importance of frequent follow up visits to maximize her success with intensive lifestyle modifications for her multiple health conditions.  ALLERGIES: Allergies  Allergen Reactions  . Augmentin [Amoxicillin-Pot Clavulanate]     Severe yeast infections    MEDICATIONS: Current Outpatient Medications on File Prior to Visit  Medication Sig Dispense Refill  . ALPRAZolam (XANAX) 0.5 MG tablet Take 1 tablet (0.5 mg total) by mouth 3 (three) times daily as needed for anxiety. 45 tablet 1  . atorvastatin (LIPITOR) 10 MG tablet TAKE 1 TABLET BY MOUTH EVERY DAY 90 tablet 1  . azelastine (OPTIVAR) 0.05 % ophthalmic solution PLACE 1 DROP INTO BOTH EYES 2 (TWO) TIMES DAILY AS NEEDED. 18 mL 1  . buPROPion (WELLBUTRIN  XL) 150 MG 24 hr tablet TAKE 1 TABLET BY MOUTH EVERY DAY 90 tablet 1  . DULoxetine (CYMBALTA) 60 MG capsule TAKE 1 CAPSULE BY MOUTH TWICE A DAY 180 capsule 1  . hydrOXYzine (ATARAX/VISTARIL) 25 MG tablet TAKE 1 TO 2 TABLETS BY MOUTH AT BEDTIME AS NEEDED FOR SLEEP 30 tablet 0  . levocetirizine (XYZAL) 5 MG tablet TAKE 1 TABLET BY MOUTH EVERY DAY IN THE EVENING 90 tablet 1  . lisdexamfetamine (VYVANSE) 50 MG capsule Take 1 capsule (50 mg total) by mouth daily. 30 capsule 0  . lisdexamfetamine (VYVANSE) 50 MG capsule Take 1 capsule (50 mg total) by mouth daily. 30 capsule 0  . lisdexamfetamine (VYVANSE) 50 MG capsule Take 1 capsule (50 mg total) by mouth daily. 30 capsule 0  . metroNIDAZOLE (METROGEL) 1 % gel Apply topically daily. 45 g 0  . NONFORMULARY OR COMPOUNDED ITEM Tiger balm patches 1 patch daily prn 30 each 5  . omeprazole (PRILOSEC) 20 MG capsule Take 1 capsule (20 mg total) by mouth daily. 90 capsule 0  . rizatriptan (MAXALT-MLT) 10 MG disintegrating tablet TAKE 1 TABLET (10 MG TOTAL) BY MOUTH AS NEEDED FOR MIGRAINE. MAY REPEAT IN 2 HOURS IF NEEDED 10 tablet 0  . TRI-LO-MARZIA 0.18/0.215/0.25 MG-25 MCG tab Take 1 tablet by mouth daily.  3  . Vitamin D, Ergocalciferol, (DRISDOL) 1.25 MG (50000 UT) CAPS  capsule TAKE 1 CAPSULE (50,000 UNITS TOTAL) BY MOUTH EVERY 7 (SEVEN) DAYS. 4 capsule 0   No current facility-administered medications on file prior to visit.     PAST MEDICAL HISTORY: Past Medical History:  Diagnosis Date  . ADD (attention deficit disorder)   . Anxiety   . Back pain   . Chronic headaches   . Depression   . Fibromyalgia   . GERD (gastroesophageal reflux disease)   . Itchy skin   . Joint pain   . Lactose intolerance   . Migraine   . Sweating increase   . Unspecified eustachian tube disorder     PAST SURGICAL HISTORY: Past Surgical History:  Procedure Laterality Date  . CESAREAN SECTION    . OTHER SURGICAL HISTORY     preforated uterine surgery  . SINUS  EXPLORATION      SOCIAL HISTORY: Social History   Tobacco Use  . Smoking status: Former Smoker    Years: 8.00    Last attempt to quit: 05/15/2006    Years since quitting: 12.3  . Smokeless tobacco: Never Used  . Tobacco comment: 1 cig every 4 months  Substance Use Topics  . Alcohol use: Yes    Alcohol/week: 1.0 standard drinks    Types: 1 Glasses of wine per week  . Drug use: No    FAMILY HISTORY: Family History  Problem Relation Age of Onset  . Other Father        Dyslexia  . Heart disease Father   . Hypertension Father   . Alcoholism Father   . Hypertension Mother   . Thyroid disease Mother   . Anxiety disorder Mother   . Sleep apnea Mother   . Obesity Mother   . ADD / ADHD Sister     ROS: Review of Systems  Constitutional: Positive for weight loss.  Gastrointestinal: Negative for nausea and vomiting.  Musculoskeletal: Negative for myalgias.       Negative for muscle weakness  Endo/Heme/Allergies:       Negative for hypoglycemia    PHYSICAL EXAM: Blood pressure 120/81, pulse 77, temperature 98.1 F (36.7 C), temperature source Oral, height 5\' 5"  (1.651 m), weight 172 lb (78 kg), SpO2 99 %. Body mass index is 28.62 kg/m. Physical Exam Vitals signs reviewed.  Constitutional:      Appearance: Normal appearance. She is well-developed. She is obese.  Cardiovascular:     Rate and Rhythm: Normal rate.  Pulmonary:     Effort: Pulmonary effort is normal.  Musculoskeletal: Normal range of motion.  Skin:    General: Skin is warm and dry.  Neurological:     Mental Status: She is alert and oriented to person, place, and time.  Psychiatric:        Mood and Affect: Mood normal.        Behavior: Behavior normal.     RECENT LABS AND TESTS: BMET    Component Value Date/Time   NA 137 04/29/2018 1311   K 4.2 04/29/2018 1311   CL 98 04/29/2018 1311   CO2 23 04/29/2018 1311   GLUCOSE 132 (H) 04/29/2018 1311   GLUCOSE 187 (H) 10/23/2016 1348   BUN 9  04/29/2018 1311   CREATININE 0.63 04/29/2018 1311   CALCIUM 9.0 04/29/2018 1311   GFRNONAA 109 04/29/2018 1311   GFRAA 125 04/29/2018 1311   Lab Results  Component Value Date   HGBA1C 7.7 (H) 04/29/2018   Lab Results  Component Value Date   INSULIN 18.4 04/29/2018  CBC    Component Value Date/Time   WBC 6.8 04/29/2018 1311   WBC 8.0 10/23/2016 1348   RBC 4.44 04/29/2018 1311   RBC 4.31 10/23/2016 1348   HGB 13.7 04/29/2018 1311   HCT 42.3 04/29/2018 1311   PLT 305 10/23/2016 1348   MCV 95 04/29/2018 1311   MCH 30.9 04/29/2018 1311   MCH 31.8 10/23/2016 1348   MCHC 32.4 04/29/2018 1311   MCHC 33.9 10/23/2016 1348   RDW 12.8 04/29/2018 1311   LYMPHSABS 2.2 04/29/2018 1311   MONOABS 0.3 09/23/2013 0950   EOSABS 0.2 04/29/2018 1311   BASOSABS 0.0 04/29/2018 1311   Iron/TIBC/Ferritin/ %Sat    Component Value Date/Time   IRON 156 (H) 09/23/2013 0950   FERRITIN 103.5 09/23/2013 0950   IRONPCTSAT 38.0 09/23/2013 0950   Lipid Panel     Component Value Date/Time   CHOL 216 (H) 04/29/2018 1311   TRIG 158 (H) 04/29/2018 1311   HDL 61 04/29/2018 1311   LDLCALC 123 (H) 04/29/2018 1311   Hepatic Function Panel     Component Value Date/Time   PROT 6.8 04/29/2018 1311   ALBUMIN 4.4 04/29/2018 1311   AST 17 04/29/2018 1311   ALT 20 04/29/2018 1311   ALKPHOS 114 04/29/2018 1311   BILITOT 0.3 04/29/2018 1311   BILIDIR 0.0 09/23/2013 0950      Component Value Date/Time   TSH 0.770 04/29/2018 1311   TSH 1.63 05/15/2016 1557   TSH 0.99 09/23/2013 0950    Ref. Range 04/29/2018 13:11  Vitamin D, 25-Hydroxy Latest Ref Range: 30.0 - 100.0 ng/mL 24.0 (L)     OBESITY BEHAVIORAL INTERVENTION VISIT  Today's visit was # 7   Starting weight: 184 lbs Starting date: 04/29/2018 Today's weight : 172 lbs Today's date: 09/13/2018 Total lbs lost to date: 12   ASK: We discussed the diagnosis of obesity with Jillian Green today and Jillian Green agreed to give [place] permission to  discuss obesity behavioral modification therapy today.  ASSESS: Jillian Green has the diagnosis of obesity and her BMI today is 28.62 Jillian Green is in the action stage of change   ADVISE: Jillian Green was educated on the multiple health risks of obesity as well as the benefit of weight loss to improve her health. She was advised of the need for long term treatment and the importance of lifestyle modifications to improve her current health and to decrease her risk of future health problems.  AGREE: Multiple dietary modification options and treatment options were discussed and  Jillian Green agreed to follow the recommendations documented in the above note.  ARRANGE: Jillian Green was educated on the importance of frequent visits to treat obesity as outlined per CMS and USPSTF guidelines and agreed to schedule her next follow up appointment today.  Cristi Loron, am acting as Energy manager for [employer]. Manson Passey, DO  I have reviewed the above documentation for accuracy and completeness, and I agree with the above. -Corinna Capra, DO  '

## 2018-09-14 LAB — COMPREHENSIVE METABOLIC PANEL
ALT: 25 IU/L (ref 0–32)
AST: 11 IU/L (ref 0–40)
Albumin/Globulin Ratio: 2 (ref 1.2–2.2)
Albumin: 4.3 g/dL (ref 3.8–4.8)
Alkaline Phosphatase: 116 IU/L (ref 39–117)
BUN/Creatinine Ratio: 14 (ref 9–23)
BUN: 10 mg/dL (ref 6–24)
Bilirubin Total: 0.3 mg/dL (ref 0.0–1.2)
CO2: 24 mmol/L (ref 20–29)
Calcium: 9.3 mg/dL (ref 8.7–10.2)
Chloride: 103 mmol/L (ref 96–106)
Creatinine, Ser: 0.7 mg/dL (ref 0.57–1.00)
GFR calc Af Amer: 120 mL/min/{1.73_m2} (ref >59)
GFR calc non Af Amer: 104 mL/min/{1.73_m2} (ref >59)
Globulin, Total: 2.2 g/dL (ref 1.5–4.5)
Glucose: 156 mg/dL — ABNORMAL HIGH (ref 65–99)
Potassium: 4.3 mmol/L (ref 3.5–5.2)
Sodium: 142 mmol/L (ref 134–144)
TOTAL PROTEIN: 6.5 g/dL (ref 6.0–8.5)

## 2018-09-14 LAB — LIPID PANEL WITH LDL/HDL RATIO
Cholesterol, Total: 150 mg/dL (ref 100–199)
HDL: 57 mg/dL (ref >39)
LDL Calculated: 69 mg/dL (ref 0–99)
LDl/HDL Ratio: 1.2 ratio (ref 0.0–3.2)
TRIGLYCERIDES: 119 mg/dL (ref 0–149)
VLDL CHOLESTEROL CAL: 24 mg/dL (ref 5–40)

## 2018-09-14 LAB — INSULIN, RANDOM: INSULIN: 15.9 u[IU]/mL (ref 2.6–24.9)

## 2018-09-14 LAB — HEMOGLOBIN A1C
Est. average glucose Bld gHb Est-mCnc: 157 mg/dL
Hgb A1c MFr Bld: 7.1 % — ABNORMAL HIGH (ref 4.8–5.6)

## 2018-09-14 LAB — VITAMIN D 25 HYDROXY (VIT D DEFICIENCY, FRACTURES): Vit D, 25-Hydroxy: 66.1 ng/mL (ref 30.0–100.0)

## 2018-09-18 ENCOUNTER — Encounter (INDEPENDENT_AMBULATORY_CARE_PROVIDER_SITE_OTHER): Payer: Self-pay | Admitting: Bariatrics

## 2018-09-20 NOTE — Telephone Encounter (Signed)
Hi Jillian Green.   Can you call the pharmacy to say that she is testing twice daily so that she can get her strips. See note.

## 2018-09-21 NOTE — Telephone Encounter (Signed)
Spoke with the pharmacist and instruction given. Greenly Rarick, CMA

## 2018-09-27 ENCOUNTER — Telehealth: Payer: Self-pay | Admitting: *Deleted

## 2018-09-27 NOTE — Telephone Encounter (Signed)
Received Physician Orders from [facility]; forwarded to provider/SLS 02/24

## 2018-09-28 ENCOUNTER — Ambulatory Visit (INDEPENDENT_AMBULATORY_CARE_PROVIDER_SITE_OTHER): Payer: BC Managed Care – PPO | Admitting: Family Medicine

## 2018-10-11 ENCOUNTER — Encounter (INDEPENDENT_AMBULATORY_CARE_PROVIDER_SITE_OTHER): Payer: Self-pay | Admitting: Physician Assistant

## 2018-10-11 ENCOUNTER — Ambulatory Visit (INDEPENDENT_AMBULATORY_CARE_PROVIDER_SITE_OTHER): Payer: BC Managed Care – PPO | Admitting: Physician Assistant

## 2018-10-11 VITALS — BP 116/80 | HR 84 | Temp 97.5°F | Ht 65.0 in | Wt 173.0 lb

## 2018-10-11 DIAGNOSIS — E669 Obesity, unspecified: Secondary | ICD-10-CM

## 2018-10-11 DIAGNOSIS — E119 Type 2 diabetes mellitus without complications: Secondary | ICD-10-CM

## 2018-10-11 DIAGNOSIS — Z9189 Other specified personal risk factors, not elsewhere classified: Secondary | ICD-10-CM

## 2018-10-11 DIAGNOSIS — E7849 Other hyperlipidemia: Secondary | ICD-10-CM | POA: Diagnosis not present

## 2018-10-11 DIAGNOSIS — E559 Vitamin D deficiency, unspecified: Secondary | ICD-10-CM

## 2018-10-11 DIAGNOSIS — Z6830 Body mass index (BMI) 30.0-30.9, adult: Secondary | ICD-10-CM

## 2018-10-11 MED ORDER — VITAMIN D (ERGOCALCIFEROL) 1.25 MG (50000 UNIT) PO CAPS: 50000.0000 [IU] | ORAL_CAPSULE | ORAL | 0 refills | Status: DC

## 2018-10-11 MED ORDER — METFORMIN HCL 500 MG PO TABS: 500.0000 mg | ORAL_TABLET | Freq: Two times a day (BID) | ORAL | 0 refills | Status: DC

## 2018-10-11 MED ORDER — ATORVASTATIN CALCIUM 10 MG PO TABS: 10.0000 mg | ORAL_TABLET | Freq: Every day | ORAL | 0 refills | Status: DC

## 2018-10-11 NOTE — Progress Notes (Signed)
Office: [phone]  /  Fax: [phone]   HPI:   Chief Complaint: OBESITY Jillian Green is here to discuss her progress with her obesity treatment plan. She is on the Category 3 plan and is following her eating plan approximately 50 % of the time. She states she is walking for 30 minutes 2 times per week. Jillian Green reports that she struggled with hunger over the last few weeks. She is trying to get all of her protein in at dinner.  Her weight is 173 lb (78.5 kg) today and has gained 1 pound since her last visit. She has lost 11 lbs since starting treatment with [place].  Diabetes II Jillian Green has a diagnosis of diabetes type II. Jillian Green states fasting BGs range between 170 and 230. She is on metformin and denies nausea, vomiting, or diarrhea. She reports polyphagia and denies hypoglycemia. She previously had a adverse reaction to [place]. Last A1c was 7.1. She has been working on intensive lifestyle modifications including diet, exercise, and weight loss to help control her blood glucose levels.  Hyperlipidemia Jillian Green has hyperlipidemia and has been trying to improve her cholesterol levels with intensive lifestyle modification including a low saturated fat diet, exercise and weight loss. She is on atorvastatin and denies any chest pain, claudication or myalgias.  At risk for cardiovascular disease Jillian Green is at a higher than average risk for cardiovascular disease due to obesity. She currently denies any chest pain.  Vitamin D Deficiency Jillian Green has a diagnosis of vitamin D deficiency. She is currently taking prescription Vit D and denies nausea, vomiting or muscle weakness.  ALLERGIES: Allergies  Allergen Reactions  . Augmentin [Amoxicillin-Pot Clavulanate]     Severe yeast infections    MEDICATIONS: Current Outpatient Medications on File Prior to Visit  Medication Sig Dispense Refill  . ALPRAZolam (XANAX) 0.5 MG tablet Take 1 tablet (0.5 mg total) by mouth 3 (three) times daily as needed for anxiety. 45  tablet 1  . azelastine (OPTIVAR) 0.05 % ophthalmic solution PLACE 1 DROP INTO BOTH EYES 2 (TWO) TIMES DAILY AS NEEDED. 18 mL 1  . buPROPion (WELLBUTRIN XL) 150 MG 24 hr tablet TAKE 1 TABLET BY MOUTH EVERY DAY 90 tablet 1  . DULoxetine (CYMBALTA) 60 MG capsule TAKE 1 CAPSULE BY MOUTH TWICE A DAY 180 capsule 1  . glucose blood test strip Use as instructed 100 each 0  . hydrOXYzine (ATARAX/VISTARIL) 25 MG tablet TAKE 1 TO 2 TABLETS BY MOUTH AT BEDTIME AS NEEDED FOR SLEEP 30 tablet 0  . levocetirizine (XYZAL) 5 MG tablet TAKE 1 TABLET BY MOUTH EVERY DAY IN THE EVENING 90 tablet 1  . lisdexamfetamine (VYVANSE) 50 MG capsule Take 1 capsule (50 mg total) by mouth daily. 30 capsule 0  . lisdexamfetamine (VYVANSE) 50 MG capsule Take 1 capsule (50 mg total) by mouth daily. 30 capsule 0  . lisdexamfetamine (VYVANSE) 50 MG capsule Take 1 capsule (50 mg total) by mouth daily. 30 capsule 0  . metroNIDAZOLE (METROGEL) 1 % gel Apply topically daily. 45 g 0  . NONFORMULARY OR COMPOUNDED ITEM Tiger balm patches 1 patch daily prn 30 each 5  . omeprazole (PRILOSEC) 20 MG capsule Take 1 capsule (20 mg total) by mouth daily. 90 capsule 0  . ONETOUCH DELICA LANCETS 30G MISC 1 Device by Does not apply route daily. 100 each 0  . rizatriptan (MAXALT-MLT) 10 MG disintegrating tablet TAKE 1 TABLET (10 MG TOTAL) BY MOUTH AS NEEDED FOR MIGRAINE. MAY REPEAT IN 2 HOURS IF NEEDED  10 tablet 0  . TRI-LO-MARZIA 0.18/0.215/0.25 MG-25 MCG tab Take 1 tablet by mouth daily.  3   No current facility-administered medications on file prior to visit.     PAST MEDICAL HISTORY: Past Medical History:  Diagnosis Date  . ADD (attention deficit disorder)   . Anxiety   . Back pain   . Chronic headaches   . Depression   . Fibromyalgia   . GERD (gastroesophageal reflux disease)   . Itchy skin   . Joint pain   . Lactose intolerance   . Migraine   . Sweating increase   . Type 2 diabetes mellitus without complication, without long-term  current use of insulin (HCC) 09/13/2018  . Unspecified eustachian tube disorder     PAST SURGICAL HISTORY: Past Surgical History:  Procedure Laterality Date  . CESAREAN SECTION    . OTHER SURGICAL HISTORY     preforated uterine surgery  . SINUS EXPLORATION      SOCIAL HISTORY: Social History   Tobacco Use  . Smoking status: Former Smoker    Years: 8.00    Last attempt to quit: 05/15/2006    Years since quitting: 12.4  . Smokeless tobacco: Never Used  . Tobacco comment: 1 cig every 4 months  Substance Use Topics  . Alcohol use: Yes    Alcohol/week: 1.0 standard drinks    Types: 1 Glasses of wine per week  . Drug use: No    FAMILY HISTORY: Family History  Problem Relation Age of Onset  . Other Father        Dyslexia  . Heart disease Father   . Hypertension Father   . Alcoholism Father   . Hypertension Mother   . Thyroid disease Mother   . Anxiety disorder Mother   . Sleep apnea Mother   . Obesity Mother   . ADD / ADHD Sister     ROS: Review of Systems  Constitutional: Negative for weight loss.  Cardiovascular: Negative for chest pain and claudication.  Gastrointestinal: Negative for diarrhea, nausea and vomiting.  Musculoskeletal: Negative for myalgias.       Negative muscle weakness  Endo/Heme/Allergies:       Positive polyphagia Negative hypoglycemia    PHYSICAL EXAM: Blood pressure 116/80, pulse 84, temperature (!) 97.5 F (36.4 C), height 5\' 5"  (1.651 m), weight 173 lb (78.5 kg), last menstrual period 09/12/2018, SpO2 98 %. Body mass index is 28.79 kg/m. Physical Exam Vitals signs reviewed.  Constitutional:      Appearance: Normal appearance. She is obese.  Cardiovascular:     Rate and Rhythm: Normal rate.     Pulses: Normal pulses.  Pulmonary:     Effort: Pulmonary effort is normal.     Breath sounds: Normal breath sounds.  Musculoskeletal: Normal range of motion.  Skin:    General: Skin is warm and dry.  Neurological:     Mental Status:  She is alert and oriented to person, place, and time.  Psychiatric:        Mood and Affect: Mood normal.        Behavior: Behavior normal.     RECENT LABS AND TESTS: BMET    Component Value Date/Time   NA 142 09/13/2018 1351   K 4.3 09/13/2018 1351   CL 103 09/13/2018 1351   CO2 24 09/13/2018 1351   GLUCOSE 156 (H) 09/13/2018 1351   GLUCOSE 187 (H) 10/23/2016 1348   BUN 10 09/13/2018 1351   CREATININE 0.70 09/13/2018 1351   CALCIUM  9.3 09/13/2018 1351   GFRNONAA 104 09/13/2018 1351   GFRAA 120 09/13/2018 1351   Lab Results  Component Value Date   HGBA1C 7.1 (H) 09/13/2018   HGBA1C 7.7 (H) 04/29/2018   Lab Results  Component Value Date   INSULIN 15.9 09/13/2018   INSULIN 18.4 04/29/2018   CBC    Component Value Date/Time   WBC 6.8 04/29/2018 1311   WBC 8.0 10/23/2016 1348   RBC 4.44 04/29/2018 1311   RBC 4.31 10/23/2016 1348   HGB 13.7 04/29/2018 1311   HCT 42.3 04/29/2018 1311   PLT 305 10/23/2016 1348   MCV 95 04/29/2018 1311   MCH 30.9 04/29/2018 1311   MCH 31.8 10/23/2016 1348   MCHC 32.4 04/29/2018 1311   MCHC 33.9 10/23/2016 1348   RDW 12.8 04/29/2018 1311   LYMPHSABS 2.2 04/29/2018 1311   MONOABS 0.3 09/23/2013 0950   EOSABS 0.2 04/29/2018 1311   BASOSABS 0.0 04/29/2018 1311   Iron/TIBC/Ferritin/ %Sat    Component Value Date/Time   IRON 156 (H) 09/23/2013 0950   FERRITIN 103.5 09/23/2013 0950   IRONPCTSAT 38.0 09/23/2013 0950   Lipid Panel     Component Value Date/Time   CHOL 150 09/13/2018 1351   TRIG 119 09/13/2018 1351   HDL 57 09/13/2018 1351   LDLCALC 69 09/13/2018 1351   Hepatic Function Panel     Component Value Date/Time   PROT 6.5 09/13/2018 1351   ALBUMIN 4.3 09/13/2018 1351   AST 11 09/13/2018 1351   ALT 25 09/13/2018 1351   ALKPHOS 116 09/13/2018 1351   BILITOT 0.3 09/13/2018 1351   BILIDIR 0.0 09/23/2013 0950      Component Value Date/Time   TSH 0.770 04/29/2018 1311   TSH 1.63 05/15/2016 1557   TSH 0.99 09/23/2013  0950    ASSESSMENT AND PLAN: Type 2 diabetes mellitus without complication, without long-term current use of insulin (HCC) - Plan: metFORMIN (GLUCOPHAGE) 500 MG tablet  Other hyperlipidemia - Plan: atorvastatin (LIPITOR) 10 MG tablet  Vitamin D deficiency - Plan: Vitamin D, Ergocalciferol, (DRISDOL) 1.25 MG (50000 UT) CAPS capsule  At risk for heart disease  Class 1 obesity with serious comorbidity and body mass index (BMI) of 30.0 to 30.9 in adult, unspecified obesity type - Starting BMI greater then 30  PLAN:  Diabetes II Jillian Green has been given extensive diabetes education by myself today including ideal fasting and post-prandial blood glucose readings, individual ideal Hgb A1c goals and hypoglycemia prevention. We discussed the importance of good blood sugar control to decrease the likelihood of diabetic complications such as nephropathy, neuropathy, limb loss, blindness, coronary artery disease, and death. We discussed the importance of intensive lifestyle modification including diet, exercise and weight loss as the first line treatment for diabetes. Aizlyn agrees to continue taking metformin 500 mg BID #60 and we will refill for 1 month. Jillian Green agrees to follow up with our clinic in 2 weeks.  Hyperlipidemia Jillian Green was informed of the American Heart Association Guidelines emphasizing intensive lifestyle modifications as the first line treatment for hyperlipidemia. We discussed many lifestyle modifications today in depth, and Jillian Green will continue to work on decreasing saturated fats such as fatty red meat, butter and many fried foods. Jillian Green agrees to continue taking atorvastatin 10 mg qd #30 and we will refill for 1 month. She will also increase vegetables and lean protein in her diet and continue to work on exercise and weight loss efforts. Jillian Green agrees to follow up with our clinic in 2  weeks.  Cardiovascular risk counseling Jillian Green was given extended (15 minutes) coronary artery disease  prevention counseling today. She is 48 y.o. female and has risk factors for heart disease including obesity, diabetes II, and hyperlipidemia. We discussed intensive lifestyle modifications today with an emphasis on specific weight loss instructions and strategies. Pt was also informed of the importance of increasing exercise and decreasing saturated fats to help prevent heart disease.  Vitamin D Deficiency Jillian Green was informed that low vitamin D levels contributes to fatigue and are associated with obesity, breast, and colon cancer. Jillian Green agrees to change prescription Vit D to 50,000 IU every 14 days #2 with no refills. She will follow up for routine testing of vitamin D, at least 2-3 times per year. She was informed of the risk of over-replacement of vitamin D and agrees to not increase her dose unless she discusses this with [place] first. Jillian Green agrees to follow up with our clinic in 2 weeks.  Obesity Jillian Green is currently in the action stage of change. As such, her goal is to continue with weight loss efforts She has agreed to change to follow the Category 3 plan + 200 calories  Jillian Green has been instructed to work up to a goal of 150 minutes of combined cardio and strengthening exercise per week for weight loss and overall health benefits. We discussed the following Behavioral Modification Strategies today: increasing lean protein intake and work on meal planning and easy cooking plans   Jillian Green has agreed to follow up with our clinic in 2 weeks. She was informed of the importance of frequent follow up visits to maximize her success with intensive lifestyle modifications for her multiple health conditions.   OBESITY BEHAVIORAL INTERVENTION VISIT  Today's visit was # 8  Starting weight: 184 lbs Starting date: 04/29/18 Today's weight : 173 lbs Today's date: 10/11/2018 Total lbs lost to date: 11    10/11/2018  Height 5\' 5"  (1.651 m)  Weight 173 lb (78.5 kg)  BMI (Calculated) 28.79  BLOOD PRESSURE -  SYSTOLIC 116  BLOOD PRESSURE - DIASTOLIC 80   Body Fat % 36.5 %  Total Body Water (lbs) 70.4 lbs     ASK: We discussed the diagnosis of obesity with Jillian Green today and Jillian Green agreed to give [place] permission to discuss obesity behavioral modification therapy today.  ASSESS: Jillian Green has the diagnosis of obesity and her BMI today is 28.79 Jillian Green is in the action stage of change   ADVISE: Jillian Green was educated on the multiple health risks of obesity as well as the benefit of weight loss to improve her health. She was advised of the need for long term treatment and the importance of lifestyle modifications.  AGREE: Multiple dietary modification options and treatment options were discussed and  Jillian Green agreed to the above obesity treatment plan.  Jillian Green, am acting as transcriptionist for Alois Cliche, PA-C I, Alois Cliche, PA-C have reviewed above note and agree with its content

## 2018-10-21 ENCOUNTER — Encounter: Payer: Self-pay | Admitting: Family Medicine

## 2018-10-22 ENCOUNTER — Other Ambulatory Visit: Payer: Self-pay

## 2018-10-22 DIAGNOSIS — F909 Attention-deficit hyperactivity disorder, unspecified type: Secondary | ICD-10-CM

## 2018-10-22 DIAGNOSIS — F988 Other specified behavioral and emotional disorders with onset usually occurring in childhood and adolescence: Secondary | ICD-10-CM

## 2018-10-22 MED ORDER — LISDEXAMFETAMINE DIMESYLATE 50 MG PO CAPS: 50.0000 mg | ORAL_CAPSULE | Freq: Every day | ORAL | 0 refills | Status: DC

## 2018-10-22 NOTE — Telephone Encounter (Signed)
Requesting: Vyvanse Contract: Yes UDS: Yes, low risk, next screen 02/03/2019 Last OV: 08/05/2018 Next OV: N/A Last Refill: 07/13/2019, #30--0 RF Database:   Please advise

## 2018-10-26 ENCOUNTER — Telehealth: Payer: Self-pay | Admitting: *Deleted

## 2018-10-26 ENCOUNTER — Encounter (INDEPENDENT_AMBULATORY_CARE_PROVIDER_SITE_OTHER): Payer: Self-pay

## 2018-10-26 NOTE — Telephone Encounter (Signed)
Received Physician Orders from [facility]; forwarded to provider/SLS 03/24

## 2018-10-31 ENCOUNTER — Other Ambulatory Visit (INDEPENDENT_AMBULATORY_CARE_PROVIDER_SITE_OTHER): Payer: Self-pay | Admitting: Physician Assistant

## 2018-10-31 DIAGNOSIS — E559 Vitamin D deficiency, unspecified: Secondary | ICD-10-CM

## 2018-11-01 ENCOUNTER — Encounter (INDEPENDENT_AMBULATORY_CARE_PROVIDER_SITE_OTHER): Payer: Self-pay | Admitting: Physician Assistant

## 2018-11-01 ENCOUNTER — Other Ambulatory Visit: Payer: Self-pay

## 2018-11-01 ENCOUNTER — Ambulatory Visit (INDEPENDENT_AMBULATORY_CARE_PROVIDER_SITE_OTHER): Payer: BC Managed Care – PPO | Admitting: Physician Assistant

## 2018-11-01 DIAGNOSIS — Z6830 Body mass index (BMI) 30.0-30.9, adult: Secondary | ICD-10-CM

## 2018-11-01 DIAGNOSIS — E669 Obesity, unspecified: Secondary | ICD-10-CM

## 2018-11-01 DIAGNOSIS — E559 Vitamin D deficiency, unspecified: Secondary | ICD-10-CM | POA: Diagnosis not present

## 2018-11-01 DIAGNOSIS — E119 Type 2 diabetes mellitus without complications: Secondary | ICD-10-CM

## 2018-11-01 DIAGNOSIS — E7849 Other hyperlipidemia: Secondary | ICD-10-CM | POA: Diagnosis not present

## 2018-11-01 DIAGNOSIS — E66811 Obesity, class 1: Secondary | ICD-10-CM

## 2018-11-01 NOTE — Progress Notes (Addendum)
Office: [phone]  /  Fax: [phone] TeleHealth Visit:  Jillian Green has consented to this TeleHealth visit today via telephone call. The patient is located at home, the provider is located at the [facility] office. The participants in this visit include the listed provider and patient and provider's assistant.   HPI:   Chief Complaint: OBESITY Jillian Green is here to discuss her progress with her obesity treatment plan. She is on the Category 3 plan + 200 calories and is following her eating plan approximately 85 % of the time. She states she is walking for 30 minutes 4-5 times per week. Jillian Green did well with weight loss. She reports being very stressed with her job currently and that her blood sugars have been elevated. She is getting in all of the food on the plan.  We were unable to weight the patient today for this TeleHealth visit. She feels as if she has lost 1 lb since her last visit. She has lost 11-12 lbs since starting treatment with [place].  Hyperlipidemia Jillian Green has hyperlipidemia and has been trying to improve her cholesterol levels with intensive lifestyle modification including a low saturated fat diet, exercise and weight loss. She is on atorvastatin and denies any chest pain, claudication or myalgias.  Vitamin D Deficiency Jillian Green has a diagnosis of vitamin D deficiency. She is currently taking prescription Vit D and denies nausea, vomiting or muscle weakness.  Diabetes II Jillian Green has a diagnosis of diabetes type II. She denies nausea, vomiting, or diarrhea on metformin. Jillian Green states fasting BGs range between 220-230. She denies hypoglycemia and reports some polyphagia. Last A1c was 7.1. She has been working on intensive lifestyle modifications including diet, exercise, and weight loss to help control her blood glucose levels.  ASSESSMENT AND PLAN:  Other hyperlipidemia - Plan: atorvastatin (LIPITOR) 10 MG tablet  Vitamin D deficiency - Plan: Vitamin D,  Ergocalciferol, (DRISDOL) 1.25 MG (50000 UT) CAPS capsule  Type 2 diabetes mellitus without complication, without long-term current use of insulin (HCC) - Plan: Insulin Pen Needle (BD PEN NEEDLE NANO 2ND GEN) 32G X 4 MM MISC, liraglutide (VICTOZA) 18 MG/3ML SOPN  Class 1 obesity with serious comorbidity and body mass index (BMI) of 30.0 to 30.9 in adult, unspecified obesity type - Starting BMI greater then 30  PLAN:  Hyperlipidemia Jillian Green was informed of the American Heart Association Guidelines emphasizing intensive lifestyle modifications as the first line treatment for hyperlipidemia. We discussed many lifestyle modifications today in depth, and Cristi will continue to work on decreasing saturated fats such as fatty red meat, butter and many fried foods. Jillian Green agrees to continue taking atorvastatin 10 mg qd #30 and we will refill for 1 month. She will also increase vegetables and lean protein in her diet and continue to work on diet, exercise and weight loss efforts. Jillian Green agrees to follow up with our clinic in 2 weeks.  Vitamin D Deficiency Jillian Green was informed that low vitamin D levels contributes to fatigue and are associated with obesity, breast, and colon cancer. Jillian Green agrees to continue taking prescription Vit D @50 ,000 IU every 14 days #2 and we will refill for 1 month. She will follow up for routine testing of vitamin D, at least 2-3 times per year. She was informed of the risk of over-replacement of vitamin D and agrees to not increase her dose unless she discusses this with [place] first. Jillian Green agrees to follow up with our clinic in 2 weeks.  Diabetes II Jillian Green has  been given extensive diabetes education by myself today including ideal fasting and post-prandial blood glucose readings, individual ideal Hgb A1c goals and hypoglycemia prevention. We discussed the importance of good blood sugar control to decrease the likelihood of diabetic complications such as nephropathy, neuropathy, limb loss,  blindness, coronary artery disease, and death. We discussed the importance of intensive lifestyle modification including diet, exercise and weight loss as the first line treatment for diabetes. Jillian Green agrees to continue her diabetes medications and she agrees to start Victoza 0.6 mg q daily #1 pen with no refills, and nano needles #100 with no refills. Jillian Green agrees to follow up with our clinic in 2 weeks.  Obesity Jillian Green is currently in the action stage of change. As such, her goal is to continue with weight loss efforts She has agreed to follow the Category 3 plan + 200 calories Jillian Green has been instructed to work up to a goal of 150 minutes of combined cardio and strengthening exercise per week for weight loss and overall health benefits. We discussed the following Behavioral Modification Strategies today: work on meal planning and easy cooking plans and keeping healthy foods in the home   Jillian Green has agreed to follow up with our clinic in 2 weeks. She was informed of the importance of frequent follow up visits to maximize her success with intensive lifestyle modifications for her multiple health conditions.  ALLERGIES: Allergies  Allergen Reactions  . Augmentin [Amoxicillin-Pot Clavulanate]     Severe yeast infections    MEDICATIONS: Current Outpatient Medications on File Prior to Visit  Medication Sig Dispense Refill  . ALPRAZolam (XANAX) 0.5 MG tablet Take 1 tablet (0.5 mg total) by mouth 3 (three) times daily as needed for anxiety. 45 tablet 1  . atorvastatin (LIPITOR) 10 MG tablet Take 1 tablet (10 mg total) by mouth daily. 30 tablet 0  . azelastine (OPTIVAR) 0.05 % ophthalmic solution PLACE 1 DROP INTO BOTH EYES 2 (TWO) TIMES DAILY AS NEEDED. 18 mL 1  . buPROPion (WELLBUTRIN XL) 150 MG 24 hr tablet TAKE 1 TABLET BY MOUTH EVERY DAY 90 tablet 1  . DULoxetine (CYMBALTA) 60 MG capsule TAKE 1 CAPSULE BY MOUTH TWICE A DAY 180 capsule 1  . glucose blood test strip Use as instructed 100 each 0   . hydrOXYzine (ATARAX/VISTARIL) 25 MG tablet TAKE 1 TO 2 TABLETS BY MOUTH AT BEDTIME AS NEEDED FOR SLEEP 30 tablet 0  . levocetirizine (XYZAL) 5 MG tablet TAKE 1 TABLET BY MOUTH EVERY DAY IN THE EVENING 90 tablet 1  . lisdexamfetamine (VYVANSE) 50 MG capsule Take 1 capsule (50 mg total) by mouth daily. 30 capsule 0  . lisdexamfetamine (VYVANSE) 50 MG capsule Take 1 capsule (50 mg total) by mouth daily. 30 capsule 0  . lisdexamfetamine (VYVANSE) 50 MG capsule Take 1 capsule (50 mg total) by mouth daily. 30 capsule 0  . metFORMIN (GLUCOPHAGE) 500 MG tablet Take 1 tablet (500 mg total) by mouth 2 (two) times daily with a meal. 60 tablet 0  . metroNIDAZOLE (METROGEL) 1 % gel Apply topically daily. 45 g 0  . NONFORMULARY OR COMPOUNDED ITEM Tiger balm patches 1 patch daily prn 30 each 5  . omeprazole (PRILOSEC) 20 MG capsule Take 1 capsule (20 mg total) by mouth daily. 90 capsule 0  . ONETOUCH DELICA LANCETS 30G MISC 1 Device by Does not apply route daily. 100 each 0  . rizatriptan (MAXALT-MLT) 10 MG disintegrating tablet TAKE 1 TABLET (10 MG TOTAL) BY MOUTH AS NEEDED  FOR MIGRAINE. MAY REPEAT IN 2 HOURS IF NEEDED 10 tablet 0  . TRI-LO-MARZIA 0.18/0.215/0.25 MG-25 MCG tab Take 1 tablet by mouth daily.  3  . Vitamin D, Ergocalciferol, (DRISDOL) 1.25 MG (50000 UT) CAPS capsule Take 1 capsule (50,000 Units total) by mouth every 14 (fourteen) days. 2 capsule 0   No current facility-administered medications on file prior to visit.     PAST MEDICAL HISTORY: Past Medical History:  Diagnosis Date  . ADD (attention deficit disorder)   . Anxiety   . Back pain   . Chronic headaches   . Depression   . Fibromyalgia   . GERD (gastroesophageal reflux disease)   . Itchy skin   . Joint pain   . Lactose intolerance   . Migraine   . Sweating increase   . Type 2 diabetes mellitus without complication, without long-term current use of insulin (HCC) 09/13/2018  . Unspecified eustachian tube disorder     PAST  SURGICAL HISTORY: Past Surgical History:  Procedure Laterality Date  . CESAREAN SECTION    . OTHER SURGICAL HISTORY     preforated uterine surgery  . SINUS EXPLORATION      SOCIAL HISTORY: Social History   Tobacco Use  . Smoking status: Former Smoker    Years: 8.00    Last attempt to quit: 05/15/2006    Years since quitting: 12.4  . Smokeless tobacco: Never Used  . Tobacco comment: 1 cig every 4 months  Substance Use Topics  . Alcohol use: Yes    Alcohol/week: 1.0 standard drinks    Types: 1 Glasses of wine per week  . Drug use: No    FAMILY HISTORY: Family History  Problem Relation Age of Onset  . Other Father        Dyslexia  . Heart disease Father   . Hypertension Father   . Alcoholism Father   . Hypertension Mother   . Thyroid disease Mother   . Anxiety disorder Mother   . Sleep apnea Mother   . Obesity Mother   . ADD / ADHD Sister     ROS: Review of Systems  Constitutional: Positive for weight loss.  Cardiovascular: Negative for chest pain and claudication.  Gastrointestinal: Negative for diarrhea, nausea and vomiting.  Musculoskeletal: Negative for myalgias.       Negative muscle weakness  Endo/Heme/Allergies:       Negative hypoglycemia Positive polyphagia    PHYSICAL EXAM: Pt in no acute distress  RECENT LABS AND TESTS: BMET    Component Value Date/Time   NA 142 09/13/2018 1351   K 4.3 09/13/2018 1351   CL 103 09/13/2018 1351   CO2 24 09/13/2018 1351   GLUCOSE 156 (H) 09/13/2018 1351   GLUCOSE 187 (H) 10/23/2016 1348   BUN 10 09/13/2018 1351   CREATININE 0.70 09/13/2018 1351   CALCIUM 9.3 09/13/2018 1351   GFRNONAA 104 09/13/2018 1351   GFRAA 120 09/13/2018 1351   Lab Results  Component Value Date   HGBA1C 7.1 (H) 09/13/2018   HGBA1C 7.7 (H) 04/29/2018   Lab Results  Component Value Date   INSULIN 15.9 09/13/2018   INSULIN 18.4 04/29/2018   CBC    Component Value Date/Time   WBC 6.8 04/29/2018 1311   WBC 8.0 10/23/2016  1348   RBC 4.44 04/29/2018 1311   RBC 4.31 10/23/2016 1348   HGB 13.7 04/29/2018 1311   HCT 42.3 04/29/2018 1311   PLT 305 10/23/2016 1348   MCV 95 04/29/2018 1311  MCH 30.9 04/29/2018 1311   MCH 31.8 10/23/2016 1348   MCHC 32.4 04/29/2018 1311   MCHC 33.9 10/23/2016 1348   RDW 12.8 04/29/2018 1311   LYMPHSABS 2.2 04/29/2018 1311   MONOABS 0.3 09/23/2013 0950   EOSABS 0.2 04/29/2018 1311   BASOSABS 0.0 04/29/2018 1311   Iron/TIBC/Ferritin/ %Sat    Component Value Date/Time   IRON 156 (H) 09/23/2013 0950   FERRITIN 103.5 09/23/2013 0950   IRONPCTSAT 38.0 09/23/2013 0950   Lipid Panel     Component Value Date/Time   CHOL 150 09/13/2018 1351   TRIG 119 09/13/2018 1351   HDL 57 09/13/2018 1351   LDLCALC 69 09/13/2018 1351   Hepatic Function Panel     Component Value Date/Time   PROT 6.5 09/13/2018 1351   ALBUMIN 4.3 09/13/2018 1351   AST 11 09/13/2018 1351   ALT 25 09/13/2018 1351   ALKPHOS 116 09/13/2018 1351   BILITOT 0.3 09/13/2018 1351   BILIDIR 0.0 09/23/2013 0950      Component Value Date/Time   TSH 0.770 04/29/2018 1311   TSH 1.63 05/15/2016 1557   TSH 0.99 09/23/2013 0950      I, Burt Knack, am acting as transcriptionist for Alois Cliche, PA-C I, Alois Cliche, PA-C have reviewed above note and agree with its content

## 2018-11-02 MED ORDER — ATORVASTATIN CALCIUM 10 MG PO TABS: 10.0000 mg | ORAL_TABLET | Freq: Every day | ORAL | 0 refills | Status: DC

## 2018-11-02 MED ORDER — LIRAGLUTIDE 18 MG/3ML ~~LOC~~ SOPN: 0.6000 mg | PEN_INJECTOR | SUBCUTANEOUS | 0 refills | Status: DC

## 2018-11-02 MED ORDER — INSULIN PEN NEEDLE 32G X 4 MM MISC: 1.0000 | Freq: Two times a day (BID) | 0 refills | Status: DC

## 2018-11-02 MED ORDER — VITAMIN D (ERGOCALCIFEROL) 1.25 MG (50000 UNIT) PO CAPS: 50000.0000 [IU] | ORAL_CAPSULE | ORAL | 0 refills | Status: DC

## 2018-11-03 ENCOUNTER — Ambulatory Visit (INDEPENDENT_AMBULATORY_CARE_PROVIDER_SITE_OTHER): Payer: BC Managed Care – PPO | Admitting: Physician Assistant

## 2018-11-07 ENCOUNTER — Other Ambulatory Visit (INDEPENDENT_AMBULATORY_CARE_PROVIDER_SITE_OTHER): Payer: Self-pay | Admitting: Physician Assistant

## 2018-11-07 DIAGNOSIS — E119 Type 2 diabetes mellitus without complications: Secondary | ICD-10-CM

## 2018-11-08 NOTE — Addendum Note (Signed)
Addended by: Alois Cliche on: 11/08/2018 01:35 PM   Modules accepted: Level of Service

## 2018-11-16 ENCOUNTER — Encounter (INDEPENDENT_AMBULATORY_CARE_PROVIDER_SITE_OTHER): Payer: Self-pay | Admitting: Physician Assistant

## 2018-11-16 ENCOUNTER — Ambulatory Visit (INDEPENDENT_AMBULATORY_CARE_PROVIDER_SITE_OTHER): Payer: BC Managed Care – PPO | Admitting: Physician Assistant

## 2018-11-16 ENCOUNTER — Other Ambulatory Visit: Payer: Self-pay

## 2018-11-16 DIAGNOSIS — E669 Obesity, unspecified: Secondary | ICD-10-CM

## 2018-11-16 DIAGNOSIS — E7849 Other hyperlipidemia: Secondary | ICD-10-CM | POA: Diagnosis not present

## 2018-11-16 DIAGNOSIS — E119 Type 2 diabetes mellitus without complications: Secondary | ICD-10-CM | POA: Diagnosis not present

## 2018-11-16 DIAGNOSIS — Z6830 Body mass index (BMI) 30.0-30.9, adult: Secondary | ICD-10-CM

## 2018-11-16 MED ORDER — LIRAGLUTIDE 18 MG/3ML ~~LOC~~ SOPN: 1.2000 mg | PEN_INJECTOR | SUBCUTANEOUS | 0 refills | Status: DC

## 2018-11-16 NOTE — Progress Notes (Signed)
Office: [phone]  /  Fax: [phone] TeleHealth Visit:  [name] has verbally consented to this TeleHealth visit today. The patient is located at home, the provider is located at the [facility] office. The participants in this visit include the listed provider and patient. The visit was conducted today via FaceTime.  HPI:   Chief Complaint: OBESITY [name] is here to discuss her progress with her obesity treatment plan. She is on the Category 3 plan + 200 calories and is following her eating plan approximately 90% of the time. She states she is walking 30 minutes 3-4 times per week. [name] reports that she has been following the plan much more closely. Her excessive hunger has decreased with the increase in her Victoza dose. She also reports her cravings have improved as well.  We were unable to weigh the patient today for this TeleHealth visit. She feels as if she has lost 1 lb since her last visit. She has lost 11 lbs since starting treatment with us.  Diabetes II [name] has a diagnosis of diabetes type II. [name] states fasting blood sugars are 136. Last A1c was 7.1 on 09/13/2018. She has been working on intensive lifestyle modifications including diet, exercise, and weight loss to help control her blood glucose levels. No nausea, vomiting, or diarrhea on Victoza and metformin.  Hyperlipidemia [name] has hyperlipidemia and has been trying to improve her cholesterol levels with intensive lifestyle modification including a low saturated fat diet, exercise and weight loss. She denies any chest pain. She is on atorvastatin.  ASSESSMENT AND PLAN:  Type 2 diabetes mellitus without complication, without long-term current use of insulin (HCC) - Plan: liraglutide (VICTOZA) 18 MG/3ML SOPN  Other hyperlipidemia  Class 1 obesity with serious comorbidity and body mass index (BMI) of 30.0 to 30.9 in adult, unspecified obesity type - Starting BMI greater then 30  PLAN:   Diabetes II [name] has been given extensive diabetes education by myself today including ideal fasting and post-prandial blood glucose readings, individual ideal HgA1c goals  and hypoglycemia prevention. We discussed the importance of good blood sugar control to decrease the likelihood of diabetic complications such as nephropathy, neuropathy, limb loss, blindness, coronary artery disease, and death. We discussed the importance of intensive lifestyle modification including diet, exercise and weight loss as the first line treatment for diabetes. [name]'s Victoza was increased to 1.2 mg and prescription was given for #2 with 0 refills. She agrees to follow-up with our clinic in 2 weeks.  Hyperlipidemia [name] was informed of the American Heart Association Guidelines emphasizing intensive lifestyle modifications as the first line treatment for hyperlipidemia. We discussed many lifestyle modifications today in depth, and [name] will continue to work on decreasing saturated fats such as fatty red meat, butter and many fried foods. She will continue her medication, increase vegetables and lean protein in her diet, and continue to work on exercise and weight loss efforts.  Obesity [name] is currently in the action stage of change. As such, her goal is to continue with weight loss efforts. She has agreed to follow the Category 3 plan + 200 calories. [name] has been instructed to work up to a goal of 150 minutes of combined cardio and strengthening exercise per week for weight loss and overall health benefits. We discussed the following Behavioral Modification Strategies today: work on meal planning, easy cooking plans, and keeping healthy foods in the home.  [name] has agreed to follow-up with our clinic in 2 weeks. She  was informed of the importance of frequent follow-up visits to maximize her success with intensive lifestyle modifications for her multiple health conditions.  ALLERGIES: Allergies  Allergen  Reactions  . Augmentin [Amoxicillin-Pot Clavulanate]     Severe yeast infections    MEDICATIONS: Current Outpatient Medications on File Prior to Visit  Medication Sig Dispense Refill  . ALPRAZolam (XANAX) 0.5 MG tablet Take 1 tablet (0.5 mg total) by mouth 3 (three) times daily as needed for anxiety. 45 tablet 1  . atorvastatin (LIPITOR) 10 MG tablet Take 1 tablet (10 mg total) by mouth daily. 30 tablet 0  . azelastine (OPTIVAR) 0.05 % ophthalmic solution PLACE 1 DROP INTO BOTH EYES 2 (TWO) TIMES DAILY AS NEEDED. 18 mL 1  . buPROPion (WELLBUTRIN XL) 150 MG 24 hr tablet TAKE 1 TABLET BY MOUTH EVERY DAY 90 tablet 1  . DULoxetine (CYMBALTA) 60 MG capsule TAKE 1 CAPSULE BY MOUTH TWICE A DAY 180 capsule 1  . glucose blood test strip Use as instructed 100 each 0  . hydrOXYzine (ATARAX/VISTARIL) 25 MG tablet TAKE 1 TO 2 TABLETS BY MOUTH AT BEDTIME AS NEEDED FOR SLEEP 30 tablet 0  . Insulin Pen Needle (BD PEN NEEDLE NANO 2ND GEN) 32G X 4 MM MISC 1 Package by Does not apply route 2 (two) times daily. 100 each 0  . levocetirizine (XYZAL) 5 MG tablet TAKE 1 TABLET BY MOUTH EVERY DAY IN THE EVENING 90 tablet 1  . lisdexamfetamine (VYVANSE) 50 MG capsule Take 1 capsule (50 mg total) by mouth daily. 30 capsule 0  . lisdexamfetamine (VYVANSE) 50 MG capsule Take 1 capsule (50 mg total) by mouth daily. 30 capsule 0  . lisdexamfetamine (VYVANSE) 50 MG capsule Take 1 capsule (50 mg total) by mouth daily. 30 capsule 0  . metFORMIN (GLUCOPHAGE) 500 MG tablet Take 1 tablet (500 mg total) by mouth 2 (two) times daily with a meal. 60 tablet 0  . metroNIDAZOLE (METROGEL) 1 % gel Apply topically daily. 45 g 0  . NONFORMULARY OR COMPOUNDED ITEM Tiger balm patches 1 patch daily prn 30 each 5  . omeprazole (PRILOSEC) 20 MG capsule Take 1 capsule (20 mg total) by mouth daily. 90 capsule 0  . ONETOUCH DELICA LANCETS 30G MISC 1 Device by Does not apply route daily. 100 each 0  . rizatriptan (MAXALT-MLT) 10 MG  disintegrating tablet TAKE 1 TABLET (10 MG TOTAL) BY MOUTH AS NEEDED FOR MIGRAINE. MAY REPEAT IN 2 HOURS IF NEEDED 10 tablet 0  . TRI-LO-MARZIA 0.18/0.215/0.25 MG-25 MCG tab Take 1 tablet by mouth daily.  3  . Vitamin D, Ergocalciferol, (DRISDOL) 1.25 MG (50000 UT) CAPS capsule Take 1 capsule (50,000 Units total) by mouth every 14 (fourteen) days. 2 capsule 0   No current facility-administered medications on file prior to visit.     PAST MEDICAL HISTORY: Past Medical History:  Diagnosis Date  . ADD (attention deficit disorder)   . Anxiety   . Back pain   . Chronic headaches   . Depression   . Fibromyalgia   . GERD (gastroesophageal reflux disease)   . Itchy skin   . Joint pain   . Lactose intolerance   . Migraine   . Sweating increase   . Type 2 diabetes mellitus without complication, without long-term current use of insulin (HCC) 09/13/2018  . Unspecified eustachian tube disorder     PAST SURGICAL HISTORY: Past Surgical History:  Procedure Laterality Date  . CESAREAN SECTION    . OTHER SURGICAL  HISTORY     preforated uterine surgery  . SINUS EXPLORATION      SOCIAL HISTORY: Social History   Tobacco Use  . Smoking status: Former Smoker    Years: 8.00    Last attempt to quit: 05/15/2006    Years since quitting: 12.5  . Smokeless tobacco: Never Used  . Tobacco comment: 1 cig every 4 months  Substance Use Topics  . Alcohol use: Yes    Alcohol/week: 1.0 standard drinks    Types: 1 Glasses of wine per week  . Drug use: No    FAMILY HISTORY: Family History  Problem Relation Age of Onset  . Other Father        Dyslexia  . Heart disease Father   . Hypertension Father   . Alcoholism Father   . Hypertension Mother   . Thyroid disease Mother   . Anxiety disorder Mother   . Sleep apnea Mother   . Obesity Mother   . ADD / ADHD Sister    ROS: Review of Systems  Cardiovascular: Negative for chest pain.  Gastrointestinal: Negative for diarrhea, nausea and  vomiting.   PHYSICAL EXAM: Pt in no acute distress  RECENT LABS AND TESTS: BMET    Component Value Date/Time   NA 142 09/13/2018 1351   K 4.3 09/13/2018 1351   CL 103 09/13/2018 1351   CO2 24 09/13/2018 1351   GLUCOSE 156 (H) 09/13/2018 1351   GLUCOSE 187 (H) 10/23/2016 1348   BUN 10 09/13/2018 1351   CREATININE 0.70 09/13/2018 1351   CALCIUM 9.3 09/13/2018 1351   GFRNONAA 104 09/13/2018 1351   GFRAA 120 09/13/2018 1351   Lab Results  Component Value Date   HGBA1C 7.1 (H) 09/13/2018   HGBA1C 7.7 (H) 04/29/2018   Lab Results  Component Value Date   INSULIN 15.9 09/13/2018   INSULIN 18.4 04/29/2018   CBC    Component Value Date/Time   WBC 6.8 04/29/2018 1311   WBC 8.0 10/23/2016 1348   RBC 4.44 04/29/2018 1311   RBC 4.31 10/23/2016 1348   HGB 13.7 04/29/2018 1311   HCT 42.3 04/29/2018 1311   PLT 305 10/23/2016 1348   MCV 95 04/29/2018 1311   MCH 30.9 04/29/2018 1311   MCH 31.8 10/23/2016 1348   MCHC 32.4 04/29/2018 1311   MCHC 33.9 10/23/2016 1348   RDW 12.8 04/29/2018 1311   LYMPHSABS 2.2 04/29/2018 1311   MONOABS 0.3 09/23/2013 0950   EOSABS 0.2 04/29/2018 1311   BASOSABS 0.0 04/29/2018 1311   Iron/TIBC/Ferritin/ %Sat    Component Value Date/Time   IRON 156 (H) 09/23/2013 0950   FERRITIN 103.5 09/23/2013 0950   IRONPCTSAT 38.0 09/23/2013 0950   Lipid Panel     Component Value Date/Time   CHOL 150 09/13/2018 1351   TRIG 119 09/13/2018 1351   HDL 57 09/13/2018 1351   LDLCALC 69 09/13/2018 1351   Hepatic Function Panel     Component Value Date/Time   PROT 6.5 09/13/2018 1351   ALBUMIN 4.3 09/13/2018 1351   AST 11 09/13/2018 1351   ALT 25 09/13/2018 1351   ALKPHOS 116 09/13/2018 1351   BILITOT 0.3 09/13/2018 1351   BILIDIR 0.0 09/23/2013 0950      Component Value Date/Time   TSH 0.770 04/29/2018 1311   TSH 1.63 05/15/2016 1557   TSH 0.99 09/23/2013 0950   Results for DAMANI, RANDO (MRN [id number]) as of 11/16/2018 16:31  Ref. Range  09/13/2018 13:51  Vitamin D, 25-Hydroxy Latest  Ref Range: 30.0 - 100.0 ng/mL 66.1   I, Marianna Payment, am acting as Energy manager for [employer], PA-C I, Alois Cliche, PA-C have reviewed above note and agree with its content

## 2018-11-29 ENCOUNTER — Other Ambulatory Visit (INDEPENDENT_AMBULATORY_CARE_PROVIDER_SITE_OTHER): Payer: Self-pay | Admitting: Physician Assistant

## 2018-11-29 DIAGNOSIS — E559 Vitamin D deficiency, unspecified: Secondary | ICD-10-CM

## 2018-11-30 ENCOUNTER — Encounter (INDEPENDENT_AMBULATORY_CARE_PROVIDER_SITE_OTHER): Payer: Self-pay | Admitting: Physician Assistant

## 2018-12-01 ENCOUNTER — Encounter (INDEPENDENT_AMBULATORY_CARE_PROVIDER_SITE_OTHER): Payer: Self-pay | Admitting: Physician Assistant

## 2018-12-01 ENCOUNTER — Ambulatory Visit (INDEPENDENT_AMBULATORY_CARE_PROVIDER_SITE_OTHER): Payer: BC Managed Care – PPO | Admitting: Physician Assistant

## 2018-12-01 ENCOUNTER — Other Ambulatory Visit: Payer: Self-pay

## 2018-12-01 DIAGNOSIS — E119 Type 2 diabetes mellitus without complications: Secondary | ICD-10-CM

## 2018-12-01 DIAGNOSIS — E669 Obesity, unspecified: Secondary | ICD-10-CM

## 2018-12-01 DIAGNOSIS — Z6830 Body mass index (BMI) 30.0-30.9, adult: Secondary | ICD-10-CM

## 2018-12-01 DIAGNOSIS — E559 Vitamin D deficiency, unspecified: Secondary | ICD-10-CM

## 2018-12-01 MED ORDER — LIRAGLUTIDE 18 MG/3ML ~~LOC~~ SOPN: 1.2000 mg | PEN_INJECTOR | SUBCUTANEOUS | 0 refills | Status: DC

## 2018-12-01 NOTE — Telephone Encounter (Signed)
We are good I responded to patient. Jeralene Peters, LPN

## 2018-12-01 NOTE — Telephone Encounter (Signed)
Please see for appt today

## 2018-12-02 NOTE — Progress Notes (Signed)
Office: [phone]  /  Fax: [phone] TeleHealth Visit:  Jillian Green has verbally consented to this TeleHealth visit today. The patient is located at home, the provider is located at the [facility] office. The participants in this visit include the listed provider and patient. The visit was conducted today via Webex.  HPI:   Chief Complaint: OBESITY Jillian Green is here to discuss her progress with her obesity treatment plan. She is on the Category 3 plan + 200 calories and is following her eating plan approximately 30% of the time. She states she is exercising 0 minutes 0 times per week. Jillian Green reports that she did her best to eat on plan but due to nausea associated with Victoza, she was unable to get in all of her food and was eating more crackers. We were unable to weigh the patient today for this TeleHealth visit. She feels as if she has lost 1 lb since her last visit. She has lost 11 lbs since starting treatment with [place].  Diabetes II Jillian Green has a diagnosis of diabetes type II and is on metformin and Victoza. She did report episodes of nausea on Victoza. Jillian Green states her fasting blood sugar was 120 this a.m. She denies any hypoglycemic episodes. Last A1c was 7.1 on 09/13/2018. She has been working on intensive lifestyle modifications including diet, exercise, and weight loss to help control her blood glucose levels. No polyphagia.  Vitamin D deficiency Jillian Green has a diagnosis of Vitamin D deficiency. She is currently taking Vit D and denies nausea, vomiting or muscle weakness.  ASSESSMENT AND PLAN:  Type 2 diabetes mellitus without complication, without long-term current use of insulin (HCC) - Plan: liraglutide (VICTOZA) 18 MG/3ML SOPN  Vitamin D deficiency  Class 1 obesity with serious comorbidity and body mass index (BMI) of 30.0 to 30.9 in adult, unspecified obesity type - Starting BMI greater then 30  PLAN:  Diabetes II Jillian Green has been given extensive diabetes  education by myself today including ideal fasting and post-prandial blood glucose readings, individual ideal HgA1c goals  and hypoglycemia prevention. We discussed the importance of good blood sugar control to decrease the likelihood of diabetic complications such as nephropathy, neuropathy, limb loss, blindness, coronary artery disease, and death. We discussed the importance of intensive lifestyle modification including diet, exercise and weight loss as the first line treatment for diabetes. Jillian Green was given a refill on her Victoza #2 with 0 refills. Her dose of Victoza will be cut back down to 0.8 for now and she agrees to follow-up with our clinic in 3 weeks.  Vitamin D Deficiency Jillian Green was informed that low Vitamin D levels contributes to fatigue and are associated with obesity, breast, and colon cancer. She agrees to continue taking Vit D and will follow-up for routine testing of Vitamin D, at least 2-3 times per year. She was informed of the risk of over-replacement of Vitamin D and agrees to not increase her dose unless she discusses this with [place] first. Jillian Green agrees to follow-up with our clinic in 3 weeks.  Obesity Jillian Green is currently in the action stage of change. As such, her goal is to continue with weight loss efforts. She has agreed to follow the Category 3 plan + 200 calories. Jillian Green has been instructed to work up to a goal of 150 minutes of combined cardio and strengthening exercise per week for weight loss and overall health benefits. We discussed the following Behavioral Modification Strategies today: work on meal planning, easy cooking  plans, and keeping healthy foods in the home.  Jillian Green has agreed to follow-up with our clinic in 3 weeks. She was informed of the importance of frequent follow-up visits to maximize her success with intensive lifestyle modifications for her multiple health conditions.  ALLERGIES: Allergies  Allergen Reactions  . Augmentin [Amoxicillin-Pot Clavulanate]      Severe yeast infections    MEDICATIONS: Current Outpatient Medications on File Prior to Visit  Medication Sig Dispense Refill  . ALPRAZolam (XANAX) 0.5 MG tablet Take 1 tablet (0.5 mg total) by mouth 3 (three) times daily as needed for anxiety. 45 tablet 1  . atorvastatin (LIPITOR) 10 MG tablet Take 1 tablet (10 mg total) by mouth daily. 30 tablet 0  . azelastine (OPTIVAR) 0.05 % ophthalmic solution PLACE 1 DROP INTO BOTH EYES 2 (TWO) TIMES DAILY AS NEEDED. 18 mL 1  . buPROPion (WELLBUTRIN XL) 150 MG 24 hr tablet TAKE 1 TABLET BY MOUTH EVERY DAY 90 tablet 1  . DULoxetine (CYMBALTA) 60 MG capsule TAKE 1 CAPSULE BY MOUTH TWICE A DAY 180 capsule 1  . glucose blood test strip Use as instructed 100 each 0  . hydrOXYzine (ATARAX/VISTARIL) 25 MG tablet TAKE 1 TO 2 TABLETS BY MOUTH AT BEDTIME AS NEEDED FOR SLEEP 30 tablet 0  . Insulin Pen Needle (BD PEN NEEDLE NANO 2ND GEN) 32G X 4 MM MISC 1 Package by Does not apply route 2 (two) times daily. 100 each 0  . levocetirizine (XYZAL) 5 MG tablet TAKE 1 TABLET BY MOUTH EVERY DAY IN THE EVENING 90 tablet 1  . lisdexamfetamine (VYVANSE) 50 MG capsule Take 1 capsule (50 mg total) by mouth daily. 30 capsule 0  . lisdexamfetamine (VYVANSE) 50 MG capsule Take 1 capsule (50 mg total) by mouth daily. 30 capsule 0  . lisdexamfetamine (VYVANSE) 50 MG capsule Take 1 capsule (50 mg total) by mouth daily. 30 capsule 0  . metFORMIN (GLUCOPHAGE) 500 MG tablet Take 1 tablet (500 mg total) by mouth 2 (two) times daily with a meal. 60 tablet 0  . metroNIDAZOLE (METROGEL) 1 % gel Apply topically daily. 45 g 0  . NONFORMULARY OR COMPOUNDED ITEM Tiger balm patches 1 patch daily prn 30 each 5  . omeprazole (PRILOSEC) 20 MG capsule Take 1 capsule (20 mg total) by mouth daily. 90 capsule 0  . ONETOUCH DELICA LANCETS 30G MISC 1 Device by Does not apply route daily. 100 each 0  . rizatriptan (MAXALT-MLT) 10 MG disintegrating tablet TAKE 1 TABLET (10 MG TOTAL) BY MOUTH AS NEEDED  FOR MIGRAINE. MAY REPEAT IN 2 HOURS IF NEEDED 10 tablet 0  . TRI-LO-MARZIA 0.18/0.215/0.25 MG-25 MCG tab Take 1 tablet by mouth daily.  3  . Vitamin D, Ergocalciferol, (DRISDOL) 1.25 MG (50000 UT) CAPS capsule Take 1 capsule (50,000 Units total) by mouth every 14 (fourteen) days. 2 capsule 0   No current facility-administered medications on file prior to visit.     PAST MEDICAL HISTORY: Past Medical History:  Diagnosis Date  . ADD (attention deficit disorder)   . Anxiety   . Back pain   . Chronic headaches   . Depression   . Fibromyalgia   . GERD (gastroesophageal reflux disease)   . Itchy skin   . Joint pain   . Lactose intolerance   . Migraine   . Sweating increase   . Type 2 diabetes mellitus without complication, without long-term current use of insulin (HCC) 09/13/2018  . Unspecified eustachian tube disorder  PAST SURGICAL HISTORY: Past Surgical History:  Procedure Laterality Date  . CESAREAN SECTION    . OTHER SURGICAL HISTORY     preforated uterine surgery  . SINUS EXPLORATION      SOCIAL HISTORY: Social History   Tobacco Use  . Smoking status: Former Smoker    Years: 8.00    Last attempt to quit: 05/15/2006    Years since quitting: 12.5  . Smokeless tobacco: Never Used  . Tobacco comment: 1 cig every 4 months  Substance Use Topics  . Alcohol use: Yes    Alcohol/week: 1.0 standard drinks    Types: 1 Glasses of wine per week  . Drug use: No    FAMILY HISTORY: Family History  Problem Relation Age of Onset  . Other Father        Dyslexia  . Heart disease Father   . Hypertension Father   . Alcoholism Father   . Hypertension Mother   . Thyroid disease Mother   . Anxiety disorder Mother   . Sleep apnea Mother   . Obesity Mother   . ADD / ADHD Sister    ROS: Review of Systems  Gastrointestinal: Negative for nausea and vomiting.  Musculoskeletal:       Negative for muscle weakness.  Endo/Heme/Allergies:       Negative for hypoglycemia.  Negative for polyphagia.   PHYSICAL EXAM: Pt in no acute distress  RECENT LABS AND TESTS: BMET    Component Value Date/Time   NA 142 09/13/2018 1351   K 4.3 09/13/2018 1351   CL 103 09/13/2018 1351   CO2 24 09/13/2018 1351   GLUCOSE 156 (H) 09/13/2018 1351   GLUCOSE 187 (H) 10/23/2016 1348   BUN 10 09/13/2018 1351   CREATININE 0.70 09/13/2018 1351   CALCIUM 9.3 09/13/2018 1351   GFRNONAA 104 09/13/2018 1351   GFRAA 120 09/13/2018 1351   Lab Results  Component Value Date   HGBA1C 7.1 (H) 09/13/2018   HGBA1C 7.7 (H) 04/29/2018   Lab Results  Component Value Date   INSULIN 15.9 09/13/2018   INSULIN 18.4 04/29/2018   CBC    Component Value Date/Time   WBC 6.8 04/29/2018 1311   WBC 8.0 10/23/2016 1348   RBC 4.44 04/29/2018 1311   RBC 4.31 10/23/2016 1348   HGB 13.7 04/29/2018 1311   HCT 42.3 04/29/2018 1311   PLT 305 10/23/2016 1348   MCV 95 04/29/2018 1311   MCH 30.9 04/29/2018 1311   MCH 31.8 10/23/2016 1348   MCHC 32.4 04/29/2018 1311   MCHC 33.9 10/23/2016 1348   RDW 12.8 04/29/2018 1311   LYMPHSABS 2.2 04/29/2018 1311   MONOABS 0.3 09/23/2013 0950   EOSABS 0.2 04/29/2018 1311   BASOSABS 0.0 04/29/2018 1311   Iron/TIBC/Ferritin/ %Sat    Component Value Date/Time   IRON 156 (H) 09/23/2013 0950   FERRITIN 103.5 09/23/2013 0950   IRONPCTSAT 38.0 09/23/2013 0950   Lipid Panel     Component Value Date/Time   CHOL 150 09/13/2018 1351   TRIG 119 09/13/2018 1351   HDL 57 09/13/2018 1351   LDLCALC 69 09/13/2018 1351   Hepatic Function Panel     Component Value Date/Time   PROT 6.5 09/13/2018 1351   ALBUMIN 4.3 09/13/2018 1351   AST 11 09/13/2018 1351   ALT 25 09/13/2018 1351   ALKPHOS 116 09/13/2018 1351   BILITOT 0.3 09/13/2018 1351   BILIDIR 0.0 09/23/2013 0950      Component Value Date/Time   TSH  0.770 04/29/2018 1311   TSH 1.63 05/15/2016 1557   TSH 0.99 09/23/2013 0950   Results for Jillian SiresGREENAWALT, Jillian Green (MRN [id number]) as of 12/02/2018  10:08  Ref. Range 09/13/2018 13:51  Vitamin D, 25-Hydroxy Latest Ref Range: 30.0 - 100.0 ng/mL 66.1   I, Marianna Paymentenise Haag, am acting as Energy managertranscriptionist for [employer] Latitia Housewright, PA-C I, Alois Clicheracey Abdulraheem Pineo, PA-C have reviewed above note and agree with its content

## 2018-12-20 ENCOUNTER — Other Ambulatory Visit: Payer: Self-pay

## 2018-12-20 ENCOUNTER — Other Ambulatory Visit: Payer: Self-pay | Admitting: *Deleted

## 2018-12-20 ENCOUNTER — Other Ambulatory Visit (INDEPENDENT_AMBULATORY_CARE_PROVIDER_SITE_OTHER): Payer: Self-pay | Admitting: Physician Assistant

## 2018-12-20 ENCOUNTER — Ambulatory Visit (INDEPENDENT_AMBULATORY_CARE_PROVIDER_SITE_OTHER): Payer: BC Managed Care – PPO | Admitting: Physician Assistant

## 2018-12-20 DIAGNOSIS — E119 Type 2 diabetes mellitus without complications: Secondary | ICD-10-CM

## 2018-12-20 DIAGNOSIS — E669 Obesity, unspecified: Secondary | ICD-10-CM | POA: Diagnosis not present

## 2018-12-20 DIAGNOSIS — Z6830 Body mass index (BMI) 30.0-30.9, adult: Secondary | ICD-10-CM

## 2018-12-20 DIAGNOSIS — E7849 Other hyperlipidemia: Secondary | ICD-10-CM

## 2018-12-20 MED ORDER — AZELASTINE HCL 0.05 % OP SOLN: 1.0000 [drp] | Freq: Two times a day (BID) | OPHTHALMIC | 1 refills | Status: DC | PRN

## 2018-12-20 MED ORDER — METFORMIN HCL 1000 MG PO TABS: 1000.0000 mg | ORAL_TABLET | Freq: Two times a day (BID) | ORAL | 0 refills | Status: DC

## 2018-12-20 NOTE — Progress Notes (Signed)
Office: [phone]  /  Fax: [phone] TeleHealth Visit:  Jillian Green has verbally consented to this TeleHealth visit today. The patient is located at home, the provider is located at the [facility] office. The participants in this visit include the listed provider and patient. The visit was conducted today via Webex.  HPI:   Chief Complaint: OBESITY Jillian Green is here to discuss her progress with her obesity treatment plan. She is on the Category 3 plan + 200 calories and is following her eating plan approximately 85% of the time. She states she is exercising 0 minutes 0 times per week. Jillian Green reports her weight yesterday was 171 lbs. She lowered her dose of Victoza as discussed, which relieved her nausea. She is enjoying journaling and would like to continue. Jillian Green reports her blood sugar to be 243 after breakfast. We were unable to weigh the patient today for this TeleHealth visit. She states her weight yesterday was 171 lbs. She has lost 11 lbs since starting treatment with [place].  Diabetes II Jillian Green has a diagnosis of diabetes type II and is on Victoza and metformin. Jillian Green states postprandial blood sugars range between 110 and 243. Last A1c was 7.1 on 09/13/2018. She has been working on intensive lifestyle modifications including diet, exercise, and weight loss to help control her blood glucose levels. No nausea, vomiting, or diarrhea.  Hyperlipidemia Jillian Green has hyperlipidemia and has been trying to improve her cholesterol levels with intensive lifestyle modification including a low saturated fat diet, exercise and weight loss. She is on atorvastatin and denies any chest pain.   ASSESSMENT AND PLAN:  Type 2 diabetes mellitus without complication, without long-term current use of insulin (HCC) - Plan: metFORMIN (GLUCOPHAGE) 1000 MG tablet  Other hyperlipidemia  Class 1 obesity with serious comorbidity and body mass index (BMI) of 30.0 to 30.9 in adult, unspecified  obesity type - Starting BMI Greater then 30  PLAN:  Diabetes II Jillian Green has been given extensive diabetes education by myself today including ideal fasting and post-prandial blood glucose readings, individual ideal HgA1c goals  and hypoglycemia prevention. We discussed the importance of good blood sugar control to decrease the likelihood of diabetic complications such as nephropathy, neuropathy, limb loss, blindness, coronary artery disease, and death. We discussed the importance of intensive lifestyle modification including diet, exercise and weight loss as the first line treatment for diabetes. [name] will increase her metformin to 1000 mg BID #60 with 0 refills and agrees to follow-up with our clinic in 2 weeks.  Hyperlipidemia Jillian Green was informed of the American Heart Association Guidelines emphasizing intensive lifestyle modifications as the first line treatment for hyperlipidemia. We discussed many lifestyle modifications today in depth, and Jillian Green will continue to work on decreasing saturated fats such as fatty red meat, butter and many fried foods. She will continue atorvastatin, increase vegetables and lean protein in her diet, and continue to work on exercise and weight loss efforts.  Obesity Jillian Green is currently in the action stage of change. As such, her goal is to continue with weight loss efforts. She has agreed to keep a food journal with 1500-1700 calories and 95 grams of protein daily. Jillian Green has been instructed to work up to a goal of 150 minutes of combined cardio and strengthening exercise per week for weight loss and overall health benefits. We discussed the following Behavioral Modification Strategies today: increasing lean protein intake, work on meal planning and easy cooking plans.  Jillian Green has agreed to follow-up with our  clinic in 2 weeks. She was informed of the importance of frequent follow-up visits to maximize her success with intensive lifestyle modifications for her multiple  health conditions.  ALLERGIES: Allergies  Allergen Reactions  . Augmentin [Amoxicillin-Pot Clavulanate]     Severe yeast infections    MEDICATIONS: Current Outpatient Medications on File Prior to Visit  Medication Sig Dispense Refill  . ALPRAZolam (XANAX) 0.5 MG tablet Take 1 tablet (0.5 mg total) by mouth 3 (three) times daily as needed for anxiety. 45 tablet 1  . atorvastatin (LIPITOR) 10 MG tablet Take 1 tablet (10 mg total) by mouth daily. 30 tablet 0  . buPROPion (WELLBUTRIN XL) 150 MG 24 hr tablet TAKE 1 TABLET BY MOUTH EVERY DAY 90 tablet 1  . DULoxetine (CYMBALTA) 60 MG capsule TAKE 1 CAPSULE BY MOUTH TWICE A DAY 180 capsule 1  . glucose blood test strip Use as instructed 100 each 0  . hydrOXYzine (ATARAX/VISTARIL) 25 MG tablet TAKE 1 TO 2 TABLETS BY MOUTH AT BEDTIME AS NEEDED FOR SLEEP 30 tablet 0  . levocetirizine (XYZAL) 5 MG tablet TAKE 1 TABLET BY MOUTH EVERY DAY IN THE EVENING 90 tablet 1  . liraglutide (VICTOZA) 18 MG/3ML SOPN Inject 0.2 mLs (1.2 mg total) into the skin every morning. 2 pen 0  . lisdexamfetamine (VYVANSE) 50 MG capsule Take 1 capsule (50 mg total) by mouth daily. 30 capsule 0  . lisdexamfetamine (VYVANSE) 50 MG capsule Take 1 capsule (50 mg total) by mouth daily. 30 capsule 0  . lisdexamfetamine (VYVANSE) 50 MG capsule Take 1 capsule (50 mg total) by mouth daily. 30 capsule 0  . metroNIDAZOLE (METROGEL) 1 % gel Apply topically daily. 45 g 0  . NONFORMULARY OR COMPOUNDED ITEM Tiger balm patches 1 patch daily prn 30 each 5  . omeprazole (PRILOSEC) 20 MG capsule Take 1 capsule (20 mg total) by mouth daily. 90 capsule 0  . ONETOUCH DELICA LANCETS 30G MISC 1 Device by Does not apply route daily. 100 each 0  . rizatriptan (MAXALT-MLT) 10 MG disintegrating tablet TAKE 1 TABLET (10 MG TOTAL) BY MOUTH AS NEEDED FOR MIGRAINE. MAY REPEAT IN 2 HOURS IF NEEDED 10 tablet 0  . TRI-LO-MARZIA 0.18/0.215/0.25 MG-25 MCG tab Take 1 tablet by mouth daily.  3  . Vitamin D,  Ergocalciferol, (DRISDOL) 1.25 MG (50000 UT) CAPS capsule Take 1 capsule (50,000 Units total) by mouth every 14 (fourteen) days. 2 capsule 0   No current facility-administered medications on file prior to visit.     PAST MEDICAL HISTORY: Past Medical History:  Diagnosis Date  . ADD (attention deficit disorder)   . Anxiety   . Back pain   . Chronic headaches   . Depression   . Fibromyalgia   . GERD (gastroesophageal reflux disease)   . Itchy skin   . Joint pain   . Lactose intolerance   . Migraine   . Sweating increase   . Type 2 diabetes mellitus without complication, without long-term current use of insulin (HCC) 09/13/2018  . Unspecified eustachian tube disorder     PAST SURGICAL HISTORY: Past Surgical History:  Procedure Laterality Date  . CESAREAN SECTION    . OTHER SURGICAL HISTORY     preforated uterine surgery  . SINUS EXPLORATION      SOCIAL HISTORY: Social History   Tobacco Use  . Smoking status: Former Smoker    Years: 8.00    Last attempt to quit: 05/15/2006    Years since quitting: 12.6  .  Smokeless tobacco: Never Used  . Tobacco comment: 1 cig every 4 months  Substance Use Topics  . Alcohol use: Yes    Alcohol/week: 1.0 standard drinks    Types: 1 Glasses of wine per week  . Drug use: No    FAMILY HISTORY: Family History  Problem Relation Age of Onset  . Other Father        Dyslexia  . Heart disease Father   . Hypertension Father   . Alcoholism Father   . Hypertension Mother   . Thyroid disease Mother   . Anxiety disorder Mother   . Sleep apnea Mother   . Obesity Mother   . ADD / ADHD Sister    ROS: Review of Systems  Cardiovascular: Negative for chest pain.  Gastrointestinal: Negative for diarrhea, nausea and vomiting.   PHYSICAL EXAM: Pt in no acute distress  RECENT LABS AND TESTS: BMET    Component Value Date/Time   NA 142 09/13/2018 1351   K 4.3 09/13/2018 1351   CL 103 09/13/2018 1351   CO2 24 09/13/2018 1351   GLUCOSE  156 (H) 09/13/2018 1351   GLUCOSE 187 (H) 10/23/2016 1348   BUN 10 09/13/2018 1351   CREATININE 0.70 09/13/2018 1351   CALCIUM 9.3 09/13/2018 1351   GFRNONAA 104 09/13/2018 1351   GFRAA 120 09/13/2018 1351   Lab Results  Component Value Date   HGBA1C 7.1 (H) 09/13/2018   HGBA1C 7.7 (H) 04/29/2018   Lab Results  Component Value Date   INSULIN 15.9 09/13/2018   INSULIN 18.4 04/29/2018   CBC    Component Value Date/Time   WBC 6.8 04/29/2018 1311   WBC 8.0 10/23/2016 1348   RBC 4.44 04/29/2018 1311   RBC 4.31 10/23/2016 1348   HGB 13.7 04/29/2018 1311   HCT 42.3 04/29/2018 1311   PLT 305 10/23/2016 1348   MCV 95 04/29/2018 1311   MCH 30.9 04/29/2018 1311   MCH 31.8 10/23/2016 1348   MCHC 32.4 04/29/2018 1311   MCHC 33.9 10/23/2016 1348   RDW 12.8 04/29/2018 1311   LYMPHSABS 2.2 04/29/2018 1311   MONOABS 0.3 09/23/2013 0950   EOSABS 0.2 04/29/2018 1311   BASOSABS 0.0 04/29/2018 1311   Iron/TIBC/Ferritin/ %Sat    Component Value Date/Time   IRON 156 (H) 09/23/2013 0950   FERRITIN 103.5 09/23/2013 0950   IRONPCTSAT 38.0 09/23/2013 0950   Lipid Panel     Component Value Date/Time   CHOL 150 09/13/2018 1351   TRIG 119 09/13/2018 1351   HDL 57 09/13/2018 1351   LDLCALC 69 09/13/2018 1351   Hepatic Function Panel     Component Value Date/Time   PROT 6.5 09/13/2018 1351   ALBUMIN 4.3 09/13/2018 1351   AST 11 09/13/2018 1351   ALT 25 09/13/2018 1351   ALKPHOS 116 09/13/2018 1351   BILITOT 0.3 09/13/2018 1351   BILIDIR 0.0 09/23/2013 0950      Component Value Date/Time   TSH 0.770 04/29/2018 1311   TSH 1.63 05/15/2016 1557   TSH 0.99 09/23/2013 0950   Results for JAWANA, WHITESCARVER (MRN [id number]) as of 12/20/2018 14:48  Ref. Range 09/13/2018 13:51  Vitamin D, 25-Hydroxy Latest Ref Range: 30.0 - 100.0 ng/mL 66.1    I, Marianna Payment, am acting as Energy manager for [employer], PA-C I, Alois Cliche, PA-C have reviewed above note and agree with its  content

## 2019-01-04 ENCOUNTER — Ambulatory Visit (INDEPENDENT_AMBULATORY_CARE_PROVIDER_SITE_OTHER): Payer: BC Managed Care – PPO | Admitting: Physician Assistant

## 2019-01-04 ENCOUNTER — Other Ambulatory Visit: Payer: Self-pay

## 2019-01-04 ENCOUNTER — Encounter (INDEPENDENT_AMBULATORY_CARE_PROVIDER_SITE_OTHER): Payer: Self-pay | Admitting: Physician Assistant

## 2019-01-04 DIAGNOSIS — E119 Type 2 diabetes mellitus without complications: Secondary | ICD-10-CM

## 2019-01-04 DIAGNOSIS — E7849 Other hyperlipidemia: Secondary | ICD-10-CM | POA: Diagnosis not present

## 2019-01-04 DIAGNOSIS — Z6830 Body mass index (BMI) 30.0-30.9, adult: Secondary | ICD-10-CM | POA: Diagnosis not present

## 2019-01-04 DIAGNOSIS — E669 Obesity, unspecified: Secondary | ICD-10-CM | POA: Diagnosis not present

## 2019-01-05 MED ORDER — EMPAGLIFLOZIN 10 MG PO TABS: 10.0000 mg | ORAL_TABLET | Freq: Every day | ORAL | 0 refills | Status: DC

## 2019-01-05 NOTE — Progress Notes (Signed)
Office: [phone]  /  Fax: [phone] TeleHealth Visit:  Jillian Green has verbally consented to this TeleHealth visit today. The patient is located at home, the provider is located at the [facility] office. The participants in this visit include the listed provider and patient. The visit was conducted today via Webex.  HPI:   Chief Complaint: OBESITY Jillian Green is here to discuss her progress with her obesity treatment plan. She is keeping a food journal with 1500-1700 calories and 95 grams of protein daily. She states she is walking 30 minutes 3-4 times per week. Tacarra reports that she continues with some intermittent nausea from the Victoza. She is unable to get all of her food in on those days.  We were unable to weigh the patient today for this TeleHealth visit. She reports her weight at home today to be 171 lbs. She has lost 11 lbs since starting treatment with [place].  Diabetes II Jillian Green has a diagnosis of diabetes type II and is on metformin and Victoza. Justis states fasting blood sugars range between 125 and 138. Last A1c was 7.1 on 09/13/2018. She has been working on intensive lifestyle modifications including diet, exercise, and weight loss to help control her blood glucose levels. No nausea, vomiting, or diarrhea. No polyphagia.  Hyperlipidemia Jillian Green has hyperlipidemia and has been trying to improve her cholesterol levels with intensive lifestyle modification including a low saturated fat diet, exercise and weight loss. She is on atorvastatin and denies any chest pain.   ASSESSMENT AND PLAN:  Type 2 diabetes mellitus without complication, without long-term current use of insulin (HCC) - Plan: Comprehensive metabolic panel, Hemoglobin A1c, Insulin, random, VITAMIN D 25 Hydroxy (Vit-D Deficiency, Fractures), empagliflozin (JARDIANCE) 10 MG TABS tablet  Other hyperlipidemia  Class 1 obesity with serious comorbidity and body mass index (BMI) of 30.0 to 30.9 in  adult, unspecified obesity type - Starting BMI greater then 30  PLAN:  Diabetes II Jillian Green has been given extensive diabetes education by myself today including ideal fasting and post-prandial blood glucose readings, individual ideal HgA1c goals  and hypoglycemia prevention. We discussed the importance of good blood sugar control to decrease the likelihood of diabetic complications such as nephropathy, neuropathy, limb loss, blindness, coronary artery disease, and death. We discussed the importance of intensive lifestyle modification including diet, exercise and weight loss as the first line treatment for diabetes. Jillian Green will discontinue Victoza and will start Jardiance 10 mg 1 PO QAM #30 with 0 refills. She agrees to follow-up with our clinic in 2 weeks.  Hyperlipidemia Jillian Green was informed of the American Heart Association Guidelines emphasizing intensive lifestyle modifications as the first line treatment for hyperlipidemia. We discussed many lifestyle modifications today in depth, and Jillian Green will continue to work on decreasing saturated fats such as fatty red meat, butter and many fried foods. She will continue atorvastatin, increase vegetables and lean protein in her diet, and continue to work on exercise and weight loss efforts.  Obesity Jillian Green is currently in the action stage of change. As such, her goal is to continue with weight loss efforts. She has agreed to follow the Category 3 plan + 200 calories. Jillian Green has been instructed to work up to a goal of 150 minutes of combined cardio and strengthening exercise per week for weight loss and overall health benefits. We discussed the following Behavioral Modification Strategies today: no skipping meals, work on meal planning and easy cooking plans.  Jillian Green has agreed to follow up with our  clinic in 2 weeks. She was informed of the importance of frequent follow up visits to maximize her success with intensive lifestyle modifications for her multiple  health conditions.  ALLERGIES: Allergies  Allergen Reactions  . Augmentin [Amoxicillin-Pot Clavulanate]     Severe yeast infections    MEDICATIONS: Current Outpatient Medications on File Prior to Visit  Medication Sig Dispense Refill  . ALPRAZolam (XANAX) 0.5 MG tablet Take 1 tablet (0.5 mg total) by mouth 3 (three) times daily as needed for anxiety. 45 tablet 1  . atorvastatin (LIPITOR) 10 MG tablet Take 1 tablet (10 mg total) by mouth daily. 30 tablet 0  . azelastine (OPTIVAR) 0.05 % ophthalmic solution Place 1 drop into both eyes 2 (two) times daily as needed. 18 mL 1  . BD PEN NEEDLE NANO U/F 32G X 4 MM MISC USE TWICE A DAY 100 each 0  . buPROPion (WELLBUTRIN XL) 150 MG 24 hr tablet TAKE 1 TABLET BY MOUTH EVERY DAY 90 tablet 1  . DULoxetine (CYMBALTA) 60 MG capsule TAKE 1 CAPSULE BY MOUTH TWICE A DAY 180 capsule 1  . glucose blood test strip Use as instructed 100 each 0  . hydrOXYzine (ATARAX/VISTARIL) 25 MG tablet TAKE 1 TO 2 TABLETS BY MOUTH AT BEDTIME AS NEEDED FOR SLEEP 30 tablet 0  . levocetirizine (XYZAL) 5 MG tablet TAKE 1 TABLET BY MOUTH EVERY DAY IN THE EVENING 90 tablet 1  . lisdexamfetamine (VYVANSE) 50 MG capsule Take 1 capsule (50 mg total) by mouth daily. 30 capsule 0  . lisdexamfetamine (VYVANSE) 50 MG capsule Take 1 capsule (50 mg total) by mouth daily. 30 capsule 0  . lisdexamfetamine (VYVANSE) 50 MG capsule Take 1 capsule (50 mg total) by mouth daily. 30 capsule 0  . metFORMIN (GLUCOPHAGE) 1000 MG tablet Take 1 tablet (1,000 mg total) by mouth 2 (two) times daily with a meal. 60 tablet 0  . metroNIDAZOLE (METROGEL) 1 % gel Apply topically daily. 45 g 0  . NONFORMULARY OR COMPOUNDED ITEM Tiger balm patches 1 patch daily prn 30 each 5  . omeprazole (PRILOSEC) 20 MG capsule Take 1 capsule (20 mg total) by mouth daily. 90 capsule 0  . ONETOUCH DELICA LANCETS 30G MISC 1 Device by Does not apply route daily. 100 each 0  . rizatriptan (MAXALT-MLT) 10 MG disintegrating  tablet TAKE 1 TABLET (10 MG TOTAL) BY MOUTH AS NEEDED FOR MIGRAINE. MAY REPEAT IN 2 HOURS IF NEEDED 10 tablet 0  . TRI-LO-MARZIA 0.18/0.215/0.25 MG-25 MCG tab Take 1 tablet by mouth daily.  3  . Vitamin D, Ergocalciferol, (DRISDOL) 1.25 MG (50000 UT) CAPS capsule Take 1 capsule (50,000 Units total) by mouth every 14 (fourteen) days. 2 capsule 0   No current facility-administered medications on file prior to visit.     PAST MEDICAL HISTORY: Past Medical History:  Diagnosis Date  . ADD (attention deficit disorder)   . Anxiety   . Back pain   . Chronic headaches   . Depression   . Fibromyalgia   . GERD (gastroesophageal reflux disease)   . Itchy skin   . Joint pain   . Lactose intolerance   . Migraine   . Sweating increase   . Type 2 diabetes mellitus without complication, without long-term current use of insulin (HCC) 09/13/2018  . Unspecified eustachian tube disorder     PAST SURGICAL HISTORY: Past Surgical History:  Procedure Laterality Date  . CESAREAN SECTION    . OTHER SURGICAL HISTORY  preforated uterine surgery  . SINUS EXPLORATION      SOCIAL HISTORY: Social History   Tobacco Use  . Smoking status: Former Smoker    Years: 8.00    Last attempt to quit: 05/15/2006    Years since quitting: 12.6  . Smokeless tobacco: Never Used  . Tobacco comment: 1 cig every 4 months  Substance Use Topics  . Alcohol use: Yes    Alcohol/week: 1.0 standard drinks    Types: 1 Glasses of wine per week  . Drug use: No    FAMILY HISTORY: Family History  Problem Relation Age of Onset  . Other Father        Dyslexia  . Heart disease Father   . Hypertension Father   . Alcoholism Father   . Hypertension Mother   . Thyroid disease Mother   . Anxiety disorder Mother   . Sleep apnea Mother   . Obesity Mother   . ADD / ADHD Sister    ROS: Review of Systems  Cardiovascular: Negative for chest pain.  Gastrointestinal: Negative for diarrhea, nausea and vomiting.   Endo/Heme/Allergies:       Negative for polyphagia.   PHYSICAL EXAM: Pt in no acute distress  RECENT LABS AND TESTS: BMET    Component Value Date/Time   NA 142 09/13/2018 1351   K 4.3 09/13/2018 1351   CL 103 09/13/2018 1351   CO2 24 09/13/2018 1351   GLUCOSE 156 (H) 09/13/2018 1351   GLUCOSE 187 (H) 10/23/2016 1348   BUN 10 09/13/2018 1351   CREATININE 0.70 09/13/2018 1351   CALCIUM 9.3 09/13/2018 1351   GFRNONAA 104 09/13/2018 1351   GFRAA 120 09/13/2018 1351   Lab Results  Component Value Date   HGBA1C 7.1 (H) 09/13/2018   HGBA1C 7.7 (H) 04/29/2018   Lab Results  Component Value Date   INSULIN 15.9 09/13/2018   INSULIN 18.4 04/29/2018   CBC    Component Value Date/Time   WBC 6.8 04/29/2018 1311   WBC 8.0 10/23/2016 1348   RBC 4.44 04/29/2018 1311   RBC 4.31 10/23/2016 1348   HGB 13.7 04/29/2018 1311   HCT 42.3 04/29/2018 1311   PLT 305 10/23/2016 1348   MCV 95 04/29/2018 1311   MCH 30.9 04/29/2018 1311   MCH 31.8 10/23/2016 1348   MCHC 32.4 04/29/2018 1311   MCHC 33.9 10/23/2016 1348   RDW 12.8 04/29/2018 1311   LYMPHSABS 2.2 04/29/2018 1311   MONOABS 0.3 09/23/2013 0950   EOSABS 0.2 04/29/2018 1311   BASOSABS 0.0 04/29/2018 1311   Iron/TIBC/Ferritin/ %Sat    Component Value Date/Time   IRON 156 (H) 09/23/2013 0950   FERRITIN 103.5 09/23/2013 0950   IRONPCTSAT 38.0 09/23/2013 0950   Lipid Panel     Component Value Date/Time   CHOL 150 09/13/2018 1351   TRIG 119 09/13/2018 1351   HDL 57 09/13/2018 1351   LDLCALC 69 09/13/2018 1351   Hepatic Function Panel     Component Value Date/Time   PROT 6.5 09/13/2018 1351   ALBUMIN 4.3 09/13/2018 1351   AST 11 09/13/2018 1351   ALT 25 09/13/2018 1351   ALKPHOS 116 09/13/2018 1351   BILITOT 0.3 09/13/2018 1351   BILIDIR 0.0 09/23/2013 0950      Component Value Date/Time   TSH 0.770 04/29/2018 1311   TSH 1.63 05/15/2016 1557   TSH 0.99 09/23/2013 0950   Results for YAELI, HERMAN (MRN  [id number]) as of 01/05/2019 11:00  Ref. Range 09/13/2018  13:51  Vitamin D, 25-Hydroxy Latest Ref Range: 30.0 - 100.0 ng/mL 66.1    I, Marianna Payment, am acting as Energy manager for [employer], PA-C I, Alois Cliche, PA-C have reviewed above note and agree with its content

## 2019-01-11 ENCOUNTER — Other Ambulatory Visit (INDEPENDENT_AMBULATORY_CARE_PROVIDER_SITE_OTHER): Payer: Self-pay | Admitting: Physician Assistant

## 2019-01-11 DIAGNOSIS — E119 Type 2 diabetes mellitus without complications: Secondary | ICD-10-CM

## 2019-01-12 ENCOUNTER — Other Ambulatory Visit: Payer: Self-pay | Admitting: *Deleted

## 2019-01-12 DIAGNOSIS — M797 Fibromyalgia: Secondary | ICD-10-CM

## 2019-01-12 MED ORDER — DULOXETINE HCL 60 MG PO CPEP: 60.0000 mg | ORAL_CAPSULE | Freq: Two times a day (BID) | ORAL | 1 refills | Status: DC

## 2019-01-13 ENCOUNTER — Other Ambulatory Visit (INDEPENDENT_AMBULATORY_CARE_PROVIDER_SITE_OTHER): Payer: Self-pay | Admitting: Physician Assistant

## 2019-01-13 DIAGNOSIS — E119 Type 2 diabetes mellitus without complications: Secondary | ICD-10-CM

## 2019-01-18 ENCOUNTER — Telehealth (INDEPENDENT_AMBULATORY_CARE_PROVIDER_SITE_OTHER): Payer: BC Managed Care – PPO | Admitting: Physician Assistant

## 2019-01-18 ENCOUNTER — Other Ambulatory Visit: Payer: Self-pay

## 2019-01-18 ENCOUNTER — Encounter (INDEPENDENT_AMBULATORY_CARE_PROVIDER_SITE_OTHER): Payer: Self-pay | Admitting: Physician Assistant

## 2019-01-18 DIAGNOSIS — E669 Obesity, unspecified: Secondary | ICD-10-CM

## 2019-01-18 DIAGNOSIS — E559 Vitamin D deficiency, unspecified: Secondary | ICD-10-CM | POA: Diagnosis not present

## 2019-01-18 DIAGNOSIS — E7849 Other hyperlipidemia: Secondary | ICD-10-CM

## 2019-01-18 DIAGNOSIS — Z6830 Body mass index (BMI) 30.0-30.9, adult: Secondary | ICD-10-CM

## 2019-01-18 DIAGNOSIS — E119 Type 2 diabetes mellitus without complications: Secondary | ICD-10-CM

## 2019-01-18 MED ORDER — JARDIANCE 25 MG PO TABS: 25.0000 mg | ORAL_TABLET | Freq: Every day | ORAL | 0 refills | Status: DC

## 2019-01-18 MED ORDER — VITAMIN D (ERGOCALCIFEROL) 1.25 MG (50000 UNIT) PO CAPS: 50000.0000 [IU] | ORAL_CAPSULE | ORAL | 0 refills | Status: DC

## 2019-01-18 MED ORDER — ATORVASTATIN CALCIUM 10 MG PO TABS: 10.0000 mg | ORAL_TABLET | Freq: Every day | ORAL | 0 refills | Status: DC

## 2019-01-19 NOTE — Progress Notes (Signed)
Office: [phone]  /  Fax: [phone] TeleHealth Visit:  Jillian Green has verbally consented to this TeleHealth visit today. The patient is located at home, the provider is located at the [facility] office. The participants in this visit include the listed provider and patient. The visit was conducted today via Webex.  HPI:   Chief Complaint: OBESITY Jillian Green is here to discuss her progress with her obesity treatment plan. She is on the Category 3 plan and is following her eating plan approximately 70% of the time. She states she is walking 30 minutes 3 times per week. Jillian Green reports her most recent weight to be 170 lbs. She reports that since being off of the Victoza her nausea has resolved and she is able to eat the food on the plan. We were unable to weigh the patient today for this TeleHealth visit. She feels as if she has lost a couple of lbs since her last visit. She has lost 11 lbs since starting treatment with [place].  Diabetes II Jillian Green has a diagnosis of diabetes type II and is on Jardiance and metformin. Jillian Green states her fasting blood sugars are in the range of 145 and 165. Last A1c was 7.1 on 09/13/2018. She has been working on intensive lifestyle modifications including diet, exercise, and weight loss to help control her blood glucose levels. No nausea, vomiting, or diarrhea.  Vitamin D deficiency Jillian Green has a diagnosis of Vitamin D deficiency. She is currently taking prescription Vit D and denies nausea, vomiting or muscle weakness.  Hyperlipidemia Jillian Green has hyperlipidemia and has been trying to improve her cholesterol levels with intensive lifestyle modification including a low saturated fat diet, exercise and weight loss. She is on atorvastatin and denies any chest pain.   ASSESSMENT AND PLAN:  Type 2 diabetes mellitus without complication, without long-term current use of insulin (HCC) - Plan: empagliflozin (JARDIANCE) 25 MG TABS tablet  Other  hyperlipidemia - Plan: atorvastatin (LIPITOR) 10 MG tablet  Vitamin D deficiency - Plan: Vitamin D, Ergocalciferol, (DRISDOL) 1.25 MG (50000 UT) CAPS capsule  PLAN:  Diabetes II Jillian Green has been given extensive diabetes education by myself today including ideal fasting and post-prandial blood glucose readings, individual ideal Hgb A1c goals  and hypoglycemia prevention. We discussed the importance of good blood sugar control to decrease the likelihood of diabetic complications such as nephropathy, neuropathy, limb loss, blindness, coronary artery disease, and death. We discussed the importance of intensive lifestyle modification including diet, exercise and weight loss as the first line treatment for diabetes. Rukaya was given a refill on her Jardiance and dose was increased to 25 mg 1 PO daily #30 with 0 refills. She agrees to follow-up with our clinic in 2 weeks.  Vitamin D Deficiency Jillian Green was informed that low Vitamin D levels contributes to fatigue and are associated with obesity, breast, and colon cancer. She agrees to continue to take prescription Vit D @ 50,000 IU every 14 days #2 with 0 refills and will follow-up for routine testing of Vitamin D, at least 2-3 times per year. She was informed of the risk of over-replacement of Vitamin D and agrees to not increase her dose unless she discusses this with [place] first. Jillian Green agrees to follow-up with our clinic in 2 weeks.  Hyperlipidemia Jillian Green was informed of the American Heart Association Guidelines emphasizing intensive lifestyle modifications as the first line treatment for hyperlipidemia. We discussed many lifestyle modifications today in depth, and Jillian Green will continue to work on decreasing  saturated fats such as fatty red meat, butter and many fried foods. Jillian Green was given a refill on her atorvastatin #30 with 0 refills and she agrees to follow-up with our clinic in 2 weeks. She will also increase vegetables and lean protein in her diet and continue  to work on exercise and weight loss efforts.  Obesity Jillian Green is currently in the action stage of change. As such, her goal is to continue with weight loss efforts. She has agreed to follow the Category 3 plan or journal 1500-1700 calories + 95 grams of protein. Jillian Green has been instructed to work up to a goal of 150 minutes of combined cardio and strengthening exercise per week for weight loss and overall health benefits. We discussed the following Behavioral Modification Strategies today: work on meal planning, easy cooking plans, and keeping healthy foods in the home.  Jillian Green has agreed to follow-up with our clinic in 2 weeks. She was informed of the importance of frequent follow-up visits to maximize her success with intensive lifestyle modifications for her multiple health conditions.  ALLERGIES: Allergies  Allergen Reactions   Augmentin [Amoxicillin-Pot Clavulanate]     Severe yeast infections    MEDICATIONS: Current Outpatient Medications on File Prior to Visit  Medication Sig Dispense Refill   ALPRAZolam (XANAX) 0.5 MG tablet Take 1 tablet (0.5 mg total) by mouth 3 (three) times daily as needed for anxiety. 45 tablet 1   azelastine (OPTIVAR) 0.05 % ophthalmic solution Place 1 drop into both eyes 2 (two) times daily as needed. 18 mL 1   BD PEN NEEDLE NANO U/F 32G X 4 MM MISC USE TWICE A DAY 100 each 0   buPROPion (WELLBUTRIN XL) 150 MG 24 hr tablet TAKE 1 TABLET BY MOUTH EVERY DAY 90 tablet 1   DULoxetine (CYMBALTA) 60 MG capsule Take 1 capsule (60 mg total) by mouth 2 (two) times daily. 180 capsule 1   glucose blood test strip Use as instructed 100 each 0   hydrOXYzine (ATARAX/VISTARIL) 25 MG tablet TAKE 1 TO 2 TABLETS BY MOUTH AT BEDTIME AS NEEDED FOR SLEEP 30 tablet 0   levocetirizine (XYZAL) 5 MG tablet TAKE 1 TABLET BY MOUTH EVERY DAY IN THE EVENING 90 tablet 1   lisdexamfetamine (VYVANSE) 50 MG capsule Take 1 capsule (50 mg total) by mouth daily. 30 capsule 0    lisdexamfetamine (VYVANSE) 50 MG capsule Take 1 capsule (50 mg total) by mouth daily. 30 capsule 0   lisdexamfetamine (VYVANSE) 50 MG capsule Take 1 capsule (50 mg total) by mouth daily. 30 capsule 0   metFORMIN (GLUCOPHAGE) 1000 MG tablet Take 1 tablet (1,000 mg total) by mouth 2 (two) times daily with a meal. 60 tablet 0   metroNIDAZOLE (METROGEL) 1 % gel Apply topically daily. 45 g 0   NONFORMULARY OR COMPOUNDED ITEM Tiger balm patches 1 patch daily prn 30 each 5   omeprazole (PRILOSEC) 20 MG capsule Take 1 capsule (20 mg total) by mouth daily. 90 capsule 0   ONETOUCH DELICA LANCETS 30G MISC 1 Device by Does not apply route daily. 100 each 0   rizatriptan (MAXALT-MLT) 10 MG disintegrating tablet TAKE 1 TABLET (10 MG TOTAL) BY MOUTH AS NEEDED FOR MIGRAINE. MAY REPEAT IN 2 HOURS IF NEEDED 10 tablet 0   TRI-LO-MARZIA 0.18/0.215/0.25 MG-25 MCG tab Take 1 tablet by mouth daily.  3   No current facility-administered medications on file prior to visit.     PAST MEDICAL HISTORY: Past Medical History:  Diagnosis Date  ADD (attention deficit disorder)    Anxiety    Back pain    Chronic headaches    Depression    Fibromyalgia    GERD (gastroesophageal reflux disease)    Itchy skin    Joint pain    Lactose intolerance    Migraine    Sweating increase    Type 2 diabetes mellitus without complication, without long-term current use of insulin (HCC) 09/13/2018   Unspecified eustachian tube disorder     PAST SURGICAL HISTORY: Past Surgical History:  Procedure Laterality Date   CESAREAN SECTION     OTHER SURGICAL HISTORY     preforated uterine surgery   SINUS EXPLORATION      SOCIAL HISTORY: Social History   Tobacco Use   Smoking status: Former Smoker    Years: 8.00    Quit date: 05/15/2006    Years since quitting: 12.6   Smokeless tobacco: Never Used   Tobacco comment: 1 cig every 4 months  Substance Use Topics   Alcohol use: Yes    Alcohol/week:  1.0 standard drinks    Types: 1 Glasses of wine per week   Drug use: No    FAMILY HISTORY: Family History  Problem Relation Age of Onset   Other Father        Dyslexia   Heart disease Father    Hypertension Father    Alcoholism Father    Hypertension Mother    Thyroid disease Mother    Anxiety disorder Mother    Sleep apnea Mother    Obesity Mother    ADD / ADHD Sister    ROS: Review of Systems  Cardiovascular: Negative for chest pain.  Gastrointestinal: Negative for diarrhea, nausea and vomiting.  Musculoskeletal:       Negative for muscle weakness.   PHYSICAL EXAM: Pt in no acute distress  RECENT LABS AND TESTS: BMET    Component Value Date/Time   NA 142 09/13/2018 1351   K 4.3 09/13/2018 1351   CL 103 09/13/2018 1351   CO2 24 09/13/2018 1351   GLUCOSE 156 (H) 09/13/2018 1351   GLUCOSE 187 (H) 10/23/2016 1348   BUN 10 09/13/2018 1351   CREATININE 0.70 09/13/2018 1351   CALCIUM 9.3 09/13/2018 1351   GFRNONAA 104 09/13/2018 1351   GFRAA 120 09/13/2018 1351   Lab Results  Component Value Date   HGBA1C 7.1 (H) 09/13/2018   HGBA1C 7.7 (H) 04/29/2018   Lab Results  Component Value Date   INSULIN 15.9 09/13/2018   INSULIN 18.4 04/29/2018   CBC    Component Value Date/Time   WBC 6.8 04/29/2018 1311   WBC 8.0 10/23/2016 1348   RBC 4.44 04/29/2018 1311   RBC 4.31 10/23/2016 1348   HGB 13.7 04/29/2018 1311   HCT 42.3 04/29/2018 1311   PLT 305 10/23/2016 1348   MCV 95 04/29/2018 1311   MCH 30.9 04/29/2018 1311   MCH 31.8 10/23/2016 1348   MCHC 32.4 04/29/2018 1311   MCHC 33.9 10/23/2016 1348   RDW 12.8 04/29/2018 1311   LYMPHSABS 2.2 04/29/2018 1311   MONOABS 0.3 09/23/2013 0950   EOSABS 0.2 04/29/2018 1311   BASOSABS 0.0 04/29/2018 1311   Iron/TIBC/Ferritin/ %Sat    Component Value Date/Time   IRON 156 (H) 09/23/2013 0950   FERRITIN 103.5 09/23/2013 0950   IRONPCTSAT 38.0 09/23/2013 0950   Lipid Panel     Component Value  Date/Time   CHOL 150 09/13/2018 1351   TRIG 119 09/13/2018 1351  HDL 57 09/13/2018 1351   LDLCALC 69 09/13/2018 1351   Hepatic Function Panel     Component Value Date/Time   PROT 6.5 09/13/2018 1351   ALBUMIN 4.3 09/13/2018 1351   AST 11 09/13/2018 1351   ALT 25 09/13/2018 1351   ALKPHOS 116 09/13/2018 1351   BILITOT 0.3 09/13/2018 1351   BILIDIR 0.0 09/23/2013 0950      Component Value Date/Time   TSH 0.770 04/29/2018 1311   TSH 1.63 05/15/2016 1557   TSH 0.99 09/23/2013 0950   Results for Carollee SiresGREENAWALT, Jillian L (MRN [id number]) as of 01/19/2019 14:16  Ref. Range 09/13/2018 13:51  Vitamin D, 25-Hydroxy Latest Ref Range: 30.0 - 100.0 ng/mL 66.1    I, Jillian Green, am acting as Energy managertranscriptionist for [employer]y Aguilar, PA-C I, Alois Clicheracey Aguilar, PA-C have reviewed above note and agree with its content

## 2019-01-25 ENCOUNTER — Other Ambulatory Visit (INDEPENDENT_AMBULATORY_CARE_PROVIDER_SITE_OTHER): Payer: Self-pay | Admitting: Physician Assistant

## 2019-01-25 DIAGNOSIS — E119 Type 2 diabetes mellitus without complications: Secondary | ICD-10-CM

## 2019-01-25 NOTE — Telephone Encounter (Signed)
I increased her dose of this med last visit so don't refill this one please

## 2019-01-25 NOTE — Telephone Encounter (Signed)
Yes ma'am! 

## 2019-01-28 ENCOUNTER — Other Ambulatory Visit (INDEPENDENT_AMBULATORY_CARE_PROVIDER_SITE_OTHER): Payer: Self-pay | Admitting: Physician Assistant

## 2019-01-28 DIAGNOSIS — E119 Type 2 diabetes mellitus without complications: Secondary | ICD-10-CM

## 2019-01-31 ENCOUNTER — Other Ambulatory Visit (INDEPENDENT_AMBULATORY_CARE_PROVIDER_SITE_OTHER): Payer: Self-pay | Admitting: Physician Assistant

## 2019-01-31 ENCOUNTER — Other Ambulatory Visit: Payer: Self-pay | Admitting: Family Medicine

## 2019-01-31 ENCOUNTER — Encounter: Payer: Self-pay | Admitting: Family Medicine

## 2019-01-31 DIAGNOSIS — E119 Type 2 diabetes mellitus without complications: Secondary | ICD-10-CM

## 2019-01-31 DIAGNOSIS — F988 Other specified behavioral and emotional disorders with onset usually occurring in childhood and adolescence: Secondary | ICD-10-CM

## 2019-01-31 DIAGNOSIS — J302 Other seasonal allergic rhinitis: Secondary | ICD-10-CM

## 2019-01-31 MED ORDER — LISDEXAMFETAMINE DIMESYLATE 50 MG PO CAPS: 50.0000 mg | ORAL_CAPSULE | Freq: Every day | ORAL | 0 refills | Status: DC

## 2019-01-31 NOTE — Telephone Encounter (Signed)
I refilled med She is due for appointment next month

## 2019-02-02 ENCOUNTER — Other Ambulatory Visit: Payer: Self-pay

## 2019-02-02 ENCOUNTER — Encounter (INDEPENDENT_AMBULATORY_CARE_PROVIDER_SITE_OTHER): Payer: Self-pay | Admitting: Physician Assistant

## 2019-02-02 ENCOUNTER — Ambulatory Visit (INDEPENDENT_AMBULATORY_CARE_PROVIDER_SITE_OTHER): Payer: BC Managed Care – PPO | Admitting: Physician Assistant

## 2019-02-02 VITALS — BP 104/73 | HR 98 | Temp 97.9°F | Ht 65.0 in | Wt 165.0 lb

## 2019-02-02 DIAGNOSIS — E669 Obesity, unspecified: Secondary | ICD-10-CM

## 2019-02-02 DIAGNOSIS — E559 Vitamin D deficiency, unspecified: Secondary | ICD-10-CM

## 2019-02-02 DIAGNOSIS — E119 Type 2 diabetes mellitus without complications: Secondary | ICD-10-CM

## 2019-02-02 DIAGNOSIS — E7849 Other hyperlipidemia: Secondary | ICD-10-CM

## 2019-02-02 DIAGNOSIS — Z6830 Body mass index (BMI) 30.0-30.9, adult: Secondary | ICD-10-CM

## 2019-02-02 DIAGNOSIS — Z9189 Other specified personal risk factors, not elsewhere classified: Secondary | ICD-10-CM

## 2019-02-02 MED ORDER — GLUCOSE BLOOD VI STRP: ORAL_STRIP | 0 refills | Status: DC

## 2019-02-02 MED ORDER — METFORMIN HCL 1000 MG PO TABS: 1000.0000 mg | ORAL_TABLET | Freq: Two times a day (BID) | ORAL | 0 refills | Status: DC

## 2019-02-02 MED ORDER — JARDIANCE 25 MG PO TABS: 25.0000 mg | ORAL_TABLET | Freq: Every day | ORAL | 0 refills | Status: DC

## 2019-02-02 MED ORDER — ONETOUCH DELICA LANCETS 30G MISC: 1.0000 | Freq: Every day | 0 refills | Status: DC

## 2019-02-02 NOTE — Progress Notes (Signed)
Office: [phone]  /  Fax: [phone]   HPI:   Chief Complaint: OBESITY Jillian Green is here to discuss her progress with her obesity treatment plan. She is on the Category 3 plan and is following her eating plan approximately 80-85% of the time. She states she is walking 2-3 times per week. Jillian Green is doing well with the plan overall. She is eating extra fruit on some days. She states she is moving for a job this week. Her weight is 165 lb (74.8 kg) today and has had a weight loss of 8 pounds over a period of 16 weeks since her last in office visit. She has lost 19 lbs since starting treatment with us.  Diabetes II Jillian Green has a diagnosis of diabetes type II. Jillian Green states fasting blood sugars are in the range of 119 and 160 on Jardiance and metformin. Last A1c was 7.1 on 09/13/2018. She has been working on intensive lifestyle modifications including diet, exercise, and weight loss to help control her blood glucose levels. No nausea, vomiting, diarrhea, or polyphagia.  Vitamin D deficiency Jillian Green has a diagnosis of Vitamin D deficiency. She is currently taking prescription Vit D every other week and denies nausea, vomiting or muscle weakness.  At risk for osteopenia and osteoporosis Jillian Green is at higher risk of osteopenia and osteoporosis due to Vitamin D deficiency.   Hyperlipidemia Jillian Green has hyperlipidemia and has been trying to improve her cholesterol levels with intensive lifestyle modification including a low saturated fat diet, exercise and weight loss. She is on atorvastatin and denies any chest pain.   ASSESSMENT AND PLAN:  Type 2 diabetes mellitus without complication, without long-term current use of insulin (HCC) - Plan: Comprehensive metabolic panel, Hemoglobin A1c, Insulin, random, metFORMIN (GLUCOPHAGE) 1000 MG tablet, empagliflozin (JARDIANCE) 25 MG TABS tablet, OneTouch Delica Lancets 30G MISC, glucose blood test strip  Vitamin D deficiency - Plan: VITAMIN D 25 Hydroxy (Vit-D  Deficiency, Fractures)  Other hyperlipidemia - Plan: Lipid Panel With LDL/HDL Ratio  At risk for osteoporosis  Class 1 obesity with serious comorbidity and body mass index (BMI) of 30.0 to 30.9 in adult, unspecified obesity type - Starting BMI greater then 30  PLAN:  Diabetes II Jillian Green has been given extensive diabetes education by myself today including ideal fasting and post-prandial blood glucose readings, individual ideal Hgb A1c goals  and hypoglycemia prevention. We discussed the importance of good blood sugar control to decrease the likelihood of diabetic complications such as nephropathy, neuropathy, limb loss, blindness, coronary artery disease, and death. We discussed the importance of intensive lifestyle modification including diet, exercise and weight loss as the first line treatment for diabetes. Jillian Green was given a refill on her metformin #60 with 0 refills, a refill on her Jardiance #30 with 0 refills, a refill on her strips #100 with 0 refills, and a refill on her lancets #100 with 0 refills. She agrees to follow-up with our clinic in 2 weeks (TeleHealth visit).  Vitamin D Deficiency Jillian Green was informed that low Vitamin D levels contributes to fatigue and are associated with obesity, breast, and colon cancer. She agrees to continue taking prescription Vit D every other week and will have routine testing of Vitamin D. She was informed of the risk of over-replacement of Vitamin D and agrees to not increase her dose unless she discusses this with us first. Jillian Green agrees to follow-up with our clinic in 2 weeks (TeleHealth visit).  At risk for osteopenia and osteoporosis Jillian Green was given extended  (15 minutes)  osteoporosis prevention counseling today. Jillian Green is at risk for osteopenia and osteoporsis due to her Vitamin D deficiency. She was encouraged to take her Vitamin D and follow her higher calcium diet and increase strengthening exercise to help strengthen her bones and decrease her risk of  osteopenia and osteoporosis.  Hyperlipidemia Jillian Green was informed of the American Heart Association Guidelines emphasizing intensive lifestyle modifications as the first line treatment for hyperlipidemia. We discussed many lifestyle modifications today in depth, and Jillian Green will continue to work on decreasing saturated fats such as fatty red meat, butter and many fried foods. She will have labs checked, increase vegetables and lean protein in her diet, and continue to work on exercise and weight loss efforts.  Obesity Jillian Green is currently in the action stage of change. As such, her goal is to continue with weight loss efforts. She has agreed to follow the Category 3 plan. Jillian Green has been instructed to work up to a goal of 150 minutes of combined cardio and strengthening exercise per week for weight loss and overall health benefits. We discussed the following Behavioral Modification Strategies today: work on meal planning, easy cooking plans, and keeping healthy foods in the home.  Jillian Green has agreed to follow-up with our clinic in 2 weeks for a TeleHealth visit. She was informed of the importance of frequent follow-up visits to maximize her success with intensive lifestyle modifications for her multiple health conditions.  ALLERGIES: Allergies  Allergen Reactions   Augmentin [Amoxicillin-Pot Clavulanate]     Severe yeast infections    MEDICATIONS: Current Outpatient Medications on File Prior to Visit  Medication Sig Dispense Refill   ALPRAZolam (XANAX) 0.5 MG tablet Take 1 tablet (0.5 mg total) by mouth 3 (three) times daily as needed for anxiety. 45 tablet 1   atorvastatin (LIPITOR) 10 MG tablet Take 1 tablet (10 mg total) by mouth daily. 30 tablet 0   azelastine (OPTIVAR) 0.05 % ophthalmic solution Place 1 drop into both eyes 2 (two) times daily as needed. 18 mL 1   BD PEN NEEDLE NANO U/F 32G X 4 MM MISC USE TWICE A DAY 100 each 0   buPROPion (WELLBUTRIN XL) 150 MG 24 hr tablet TAKE 1  TABLET BY MOUTH EVERY DAY 90 tablet 1   DULoxetine (CYMBALTA) 60 MG capsule Take 1 capsule (60 mg total) by mouth 2 (two) times daily. 180 capsule 1   hydrOXYzine (ATARAX/VISTARIL) 25 MG tablet TAKE 1 TO 2 TABLETS BY MOUTH AT BEDTIME AS NEEDED FOR SLEEP 30 tablet 0   levocetirizine (XYZAL) 5 MG tablet TAKE 1 TABLET BY MOUTH EVERY DAY IN THE EVENING 90 tablet 1   lisdexamfetamine (VYVANSE) 50 MG capsule Take 1 capsule (50 mg total) by mouth daily. 30 capsule 0   lisdexamfetamine (VYVANSE) 50 MG capsule Take 1 capsule (50 mg total) by mouth daily. 30 capsule 0   lisdexamfetamine (VYVANSE) 50 MG capsule Take 1 capsule (50 mg total) by mouth daily. 30 capsule 0   metroNIDAZOLE (METROGEL) 1 % gel Apply topically daily. 45 g 0   NONFORMULARY OR COMPOUNDED ITEM Tiger balm patches 1 patch daily prn 30 each 5   omeprazole (PRILOSEC) 20 MG capsule Take 1 capsule (20 mg total) by mouth daily. 90 capsule 0   rizatriptan (MAXALT-MLT) 10 MG disintegrating tablet TAKE 1 TABLET (10 MG TOTAL) BY MOUTH AS NEEDED FOR MIGRAINE. MAY REPEAT IN 2 HOURS IF NEEDED 10 tablet 0   TRI-LO-MARZIA 0.18/0.215/0.25 MG-25 MCG tab Take 1 tablet by mouth daily.  3   Vitamin D, Ergocalciferol, (DRISDOL) 1.25 MG (50000 UT) CAPS capsule Take 1 capsule (50,000 Units total) by mouth every 14 (fourteen) days. 2 capsule 0   No current facility-administered medications on file prior to visit.     PAST MEDICAL HISTORY: Past Medical History:  Diagnosis Date   ADD (attention deficit disorder)    Anxiety    Back pain    Chronic headaches    Depression    Fibromyalgia    GERD (gastroesophageal reflux disease)    Itchy skin    Joint pain    Lactose intolerance    Migraine    Sweating increase    Type 2 diabetes mellitus without complication, without long-term current use of insulin (HCC) 09/13/2018   Unspecified eustachian tube disorder     PAST SURGICAL HISTORY: Past Surgical History:  Procedure  Laterality Date   CESAREAN SECTION     OTHER SURGICAL HISTORY     preforated uterine surgery   SINUS EXPLORATION      SOCIAL HISTORY: Social History   Tobacco Use   Smoking status: Former Smoker    Years: 8.00    Quit date: 05/15/2006    Years since quitting: 12.7   Smokeless tobacco: Never Used   Tobacco comment: 1 cig every 4 months  Substance Use Topics   Alcohol use: Yes    Alcohol/week: 1.0 standard drinks    Types: 1 Glasses of wine per week   Drug use: No    FAMILY HISTORY: Family History  Problem Relation Age of Onset   Other Father        Dyslexia   Heart disease Father    Hypertension Father    Alcoholism Father    Hypertension Mother    Thyroid disease Mother    Anxiety disorder Mother    Sleep apnea Mother    Obesity Mother    ADD / ADHD Sister    ROS: Review of Systems  Cardiovascular: Negative for chest pain.  Gastrointestinal: Negative for diarrhea, nausea and vomiting.  Musculoskeletal:       Negative for muscle weakness.  Endo/Heme/Allergies:       Negative for polyphagia.   PHYSICAL EXAM: Blood pressure 104/73, pulse 98, temperature 97.9 F (36.6 C), height 5\' 5"  (1.651 m), weight 165 lb (74.8 kg), SpO2 96 %. Body mass index is 27.46 kg/m. Physical Exam Vitals signs reviewed.  Constitutional:      Appearance: Normal appearance. She is obese.  Cardiovascular:     Rate and Rhythm: Normal rate.     Pulses: Normal pulses.  Pulmonary:     Effort: Pulmonary effort is normal.     Breath sounds: Normal breath sounds.  Musculoskeletal: Normal range of motion.  Skin:    General: Skin is warm and dry.  Neurological:     Mental Status: She is alert and oriented to person, place, and time.  Psychiatric:        Behavior: Behavior normal.   RECENT LABS AND TESTS: BMET    Component Value Date/Time   NA 142 09/13/2018 1351   K 4.3 09/13/2018 1351   CL 103 09/13/2018 1351   CO2 24 09/13/2018 1351   GLUCOSE 156 (H)  09/13/2018 1351   GLUCOSE 187 (H) 10/23/2016 1348   BUN 10 09/13/2018 1351   CREATININE 0.70 09/13/2018 1351   CALCIUM 9.3 09/13/2018 1351   GFRNONAA 104 09/13/2018 1351   GFRAA 120 09/13/2018 1351   Lab Results  Component Value Date  HGBA1C 7.1 (H) 09/13/2018   HGBA1C 7.7 (H) 04/29/2018   Lab Results  Component Value Date   INSULIN 15.9 09/13/2018   INSULIN 18.4 04/29/2018   CBC    Component Value Date/Time   WBC 6.8 04/29/2018 1311   WBC 8.0 10/23/2016 1348   RBC 4.44 04/29/2018 1311   RBC 4.31 10/23/2016 1348   HGB 13.7 04/29/2018 1311   HCT 42.3 04/29/2018 1311   PLT 305 10/23/2016 1348   MCV 95 04/29/2018 1311   MCH 30.9 04/29/2018 1311   MCH 31.8 10/23/2016 1348   MCHC 32.4 04/29/2018 1311   MCHC 33.9 10/23/2016 1348   RDW 12.8 04/29/2018 1311   LYMPHSABS 2.2 04/29/2018 1311   MONOABS 0.3 09/23/2013 0950   EOSABS 0.2 04/29/2018 1311   BASOSABS 0.0 04/29/2018 1311   Iron/TIBC/Ferritin/ %Sat    Component Value Date/Time   IRON 156 (H) 09/23/2013 0950   FERRITIN 103.5 09/23/2013 0950   IRONPCTSAT 38.0 09/23/2013 0950   Lipid Panel     Component Value Date/Time   CHOL 150 09/13/2018 1351   TRIG 119 09/13/2018 1351   HDL 57 09/13/2018 1351   LDLCALC 69 09/13/2018 1351   Hepatic Function Panel     Component Value Date/Time   PROT 6.5 09/13/2018 1351   ALBUMIN 4.3 09/13/2018 1351   AST 11 09/13/2018 1351   ALT 25 09/13/2018 1351   ALKPHOS 116 09/13/2018 1351   BILITOT 0.3 09/13/2018 1351   BILIDIR 0.0 09/23/2013 0950      Component Value Date/Time   TSH 0.770 04/29/2018 1311   TSH 1.63 05/15/2016 1557   TSH 0.99 09/23/2013 0950   Results for Jillian Green, Jillian Green (MRN [id number]) as of 02/02/2019 15:51  Ref. Range 09/13/2018 13:51  Vitamin D, 25-Hydroxy Latest Ref Range: 30.0 - 100.0 ng/mL 66.1   OBESITY BEHAVIORAL INTERVENTION VISIT  Today's visit was #14  Starting weight: 184 lbs Starting date: 04/29/2018 Today's weight: 165 lbs  Today's  date: 02/02/2019 Total lbs lost to date: 19    02/02/2019  Height 5\' 5"  (1.651 m)  Weight 165 lb (74.8 kg)  BMI (Calculated) 27.46  BLOOD PRESSURE - SYSTOLIC 536  BLOOD PRESSURE - DIASTOLIC 73   Body Fat % 35 %  Total Body Water (lbs) 67.61 lbs   ASK: We discussed the diagnosis of obesity with Jillian Green today and Jillian Green agreed to give [place] permission to discuss obesity behavioral modification therapy today.  ASSESS: Jillian Green has the diagnosis of obesity and her BMI today is 27.6. Jillian Green is in the action stage of change.   ADVISE: Jillian Green was educated on the multiple health risks of obesity as well as the benefit of weight loss to improve her health. She was advised of the need for long term treatment and the importance of lifestyle modifications to improve her current health and to decrease her risk of future health problems.  AGREE: Multiple dietary modification options and treatment options were discussed and  Jillian Green agreed to follow the recommendations documented in the above note.  ARRANGE: Savvy was educated on the importance of frequent visits to treat obesity as outlined per CMS and USPSTF guidelines and agreed to schedule her next follow up appointment today.  Jillian Green Dk, am acting as transcriptionist for Abby Potash, PA-C I, Abby Potash, PA-C have reviewed above note and agree with its content

## 2019-02-07 ENCOUNTER — Other Ambulatory Visit (INDEPENDENT_AMBULATORY_CARE_PROVIDER_SITE_OTHER): Payer: Self-pay | Admitting: Physician Assistant

## 2019-02-07 DIAGNOSIS — E559 Vitamin D deficiency, unspecified: Secondary | ICD-10-CM

## 2019-02-19 ENCOUNTER — Other Ambulatory Visit (INDEPENDENT_AMBULATORY_CARE_PROVIDER_SITE_OTHER): Payer: Self-pay | Admitting: Family Medicine

## 2019-02-19 DIAGNOSIS — E7849 Other hyperlipidemia: Secondary | ICD-10-CM

## 2019-02-21 ENCOUNTER — Telehealth (INDEPENDENT_AMBULATORY_CARE_PROVIDER_SITE_OTHER): Payer: BC Managed Care – PPO | Admitting: Physician Assistant

## 2019-02-25 ENCOUNTER — Other Ambulatory Visit (INDEPENDENT_AMBULATORY_CARE_PROVIDER_SITE_OTHER): Payer: Self-pay | Admitting: Physician Assistant

## 2019-02-25 DIAGNOSIS — E119 Type 2 diabetes mellitus without complications: Secondary | ICD-10-CM

## 2019-02-27 ENCOUNTER — Other Ambulatory Visit (INDEPENDENT_AMBULATORY_CARE_PROVIDER_SITE_OTHER): Payer: Self-pay | Admitting: Physician Assistant

## 2019-02-27 DIAGNOSIS — E119 Type 2 diabetes mellitus without complications: Secondary | ICD-10-CM

## 2019-02-28 ENCOUNTER — Encounter: Payer: Self-pay | Admitting: Family Medicine

## 2019-02-28 NOTE — Telephone Encounter (Signed)
Which pharmacy is it?  No name.

## 2019-03-01 ENCOUNTER — Other Ambulatory Visit: Payer: Self-pay | Admitting: Family Medicine

## 2019-03-01 DIAGNOSIS — F988 Other specified behavioral and emotional disorders with onset usually occurring in childhood and adolescence: Secondary | ICD-10-CM

## 2019-03-01 DIAGNOSIS — F909 Attention-deficit hyperactivity disorder, unspecified type: Secondary | ICD-10-CM

## 2019-03-01 MED ORDER — LISDEXAMFETAMINE DIMESYLATE 50 MG PO CAPS: 50.0000 mg | ORAL_CAPSULE | Freq: Every day | ORAL | 0 refills | Status: DC

## 2019-03-01 NOTE — Telephone Encounter (Signed)
done

## 2019-03-05 ENCOUNTER — Other Ambulatory Visit (INDEPENDENT_AMBULATORY_CARE_PROVIDER_SITE_OTHER): Payer: Self-pay | Admitting: Physician Assistant

## 2019-03-05 DIAGNOSIS — E119 Type 2 diabetes mellitus without complications: Secondary | ICD-10-CM

## 2019-03-06 ENCOUNTER — Encounter (INDEPENDENT_AMBULATORY_CARE_PROVIDER_SITE_OTHER): Payer: Self-pay | Admitting: Physician Assistant

## 2019-03-06 DIAGNOSIS — E119 Type 2 diabetes mellitus without complications: Secondary | ICD-10-CM

## 2019-03-07 MED ORDER — METFORMIN HCL 1000 MG PO TABS: 1000.0000 mg | ORAL_TABLET | Freq: Two times a day (BID) | ORAL | 0 refills | Status: DC

## 2019-03-07 NOTE — Telephone Encounter (Signed)
Please advise on refill last refill 7/1 last appt 7/1 next appt 8/10

## 2019-03-07 NOTE — Telephone Encounter (Signed)
Ok to fill 

## 2019-03-11 ENCOUNTER — Ambulatory Visit: Payer: BC Managed Care – PPO | Admitting: Family Medicine

## 2019-03-11 ENCOUNTER — Other Ambulatory Visit: Payer: Self-pay

## 2019-03-11 ENCOUNTER — Encounter: Payer: Self-pay | Admitting: Family Medicine

## 2019-03-11 VITALS — BP 105/67 | HR 91 | Temp 97.8°F | Resp 18 | Ht 65.0 in | Wt 165.6 lb

## 2019-03-11 DIAGNOSIS — F988 Other specified behavioral and emotional disorders with onset usually occurring in childhood and adolescence: Secondary | ICD-10-CM

## 2019-03-11 DIAGNOSIS — F909 Attention-deficit hyperactivity disorder, unspecified type: Secondary | ICD-10-CM | POA: Diagnosis not present

## 2019-03-11 DIAGNOSIS — M797 Fibromyalgia: Secondary | ICD-10-CM | POA: Diagnosis not present

## 2019-03-11 DIAGNOSIS — L709 Acne, unspecified: Secondary | ICD-10-CM | POA: Diagnosis not present

## 2019-03-11 DIAGNOSIS — G43009 Migraine without aura, not intractable, without status migrainosus: Secondary | ICD-10-CM

## 2019-03-11 MED ORDER — SULFACETAMIDE SODIUM (ACNE) 10 % EX LOTN: TOPICAL_LOTION | CUTANEOUS | 3 refills | Status: DC

## 2019-03-11 MED ORDER — NONFORMULARY OR COMPOUNDED ITEM: 5 refills | Status: DC

## 2019-03-11 MED ORDER — LISDEXAMFETAMINE DIMESYLATE 50 MG PO CAPS: 50.0000 mg | ORAL_CAPSULE | Freq: Every day | ORAL | 0 refills | Status: DC

## 2019-03-11 MED ORDER — RIZATRIPTAN BENZOATE 10 MG PO TBDP: 10.0000 mg | ORAL_TABLET | ORAL | 0 refills | Status: DC | PRN

## 2019-03-11 MED ORDER — DULOXETINE HCL 60 MG PO CPEP: 60.0000 mg | ORAL_CAPSULE | Freq: Two times a day (BID) | ORAL | 1 refills | Status: DC

## 2019-03-11 NOTE — Patient Instructions (Signed)

## 2019-03-11 NOTE — Progress Notes (Signed)
Patient ID: Jillian Green, female    DOB: 1971-01-24  Age: 48 y.o. MRN: [id number]    Subjective:  Subjective  HPI Jillian Green presents for f/u add and anxiety.  No complaints   Review of Systems  Constitutional: Negative for chills and fever.  HENT: Negative for congestion and hearing loss.   Eyes: Negative for discharge.  Respiratory: Negative for cough and shortness of breath.   Cardiovascular: Negative for chest pain, palpitations and leg swelling.  Gastrointestinal: Negative for abdominal pain, blood in stool, constipation, diarrhea, nausea and vomiting.  Genitourinary: Negative for dysuria, frequency, hematuria and urgency.  Musculoskeletal: Negative for back pain and myalgias.  Skin: Negative for rash.  Allergic/Immunologic: Negative for environmental allergies.  Neurological: Negative for dizziness, weakness and headaches.  Hematological: Does not bruise/bleed easily.  Psychiatric/Behavioral: Negative for suicidal ideas. The patient is not nervous/anxious.     History Past Medical History:  Diagnosis Date  . ADD (attention deficit disorder)   . Anxiety   . Back pain   . Chronic headaches   . Depression   . Fibromyalgia   . GERD (gastroesophageal reflux disease)   . Itchy skin   . Joint pain   . Lactose intolerance   . Migraine   . Sweating increase   . Type 2 diabetes mellitus without complication, without long-term current use of insulin (HCC) 09/13/2018  . Unspecified eustachian tube disorder     She has a past surgical history that includes Sinus exploration; Cesarean section; and OTHER SURGICAL HISTORY.   Her family history includes ADD / ADHD in her sister; Alcoholism in her father; Anxiety disorder in her mother; Heart disease in her father; Hypertension in her father and mother; Obesity in her mother; Other in her father; Sleep apnea in her mother; Thyroid disease in her mother.She reports that she quit smoking about 12 years ago. She quit after  8.00 years of use. She has never used smokeless tobacco. She reports current alcohol use of about 1.0 standard drinks of alcohol per week. She reports that she does not use drugs.  Current Outpatient Medications on File Prior to Visit  Medication Sig Dispense Refill  . ALPRAZolam (XANAX) 0.5 MG tablet Take 1 tablet (0.5 mg total) by mouth 3 (three) times daily as needed for anxiety. 45 tablet 1  . atorvastatin (LIPITOR) 10 MG tablet TAKE 1 TABLET BY MOUTH EVERY DAY 30 tablet 0  . azelastine (OPTIVAR) 0.05 % ophthalmic solution Place 1 drop into both eyes 2 (two) times daily as needed. 18 mL 1  . BD PEN NEEDLE NANO U/F 32G X 4 MM MISC USE TWICE A DAY 100 each 0  . buPROPion (WELLBUTRIN XL) 150 MG 24 hr tablet TAKE 1 TABLET BY MOUTH EVERY DAY 90 tablet 1  . empagliflozin (JARDIANCE) 25 MG TABS tablet Take 25 mg by mouth daily. 30 tablet 0  . glucose blood test strip Use as instructed 100 each 0  . hydrOXYzine (ATARAX/VISTARIL) 25 MG tablet TAKE 1 TO 2 TABLETS BY MOUTH AT BEDTIME AS NEEDED FOR SLEEP 30 tablet 0  . levocetirizine (XYZAL) 5 MG tablet TAKE 1 TABLET BY MOUTH EVERY DAY IN THE EVENING 90 tablet 1  . metFORMIN (GLUCOPHAGE) 1000 MG tablet Take 1 tablet (1,000 mg total) by mouth 2 (two) times daily with a meal. 60 tablet 0  . metroNIDAZOLE (METROGEL) 1 % gel Apply topically daily. 45 g 0  . omeprazole (PRILOSEC) 20 MG capsule Take 1 capsule (20 mg total)  by mouth daily. 90 capsule 0  . OneTouch Delica Lancets 71G MISC 1 Device by Does not apply route daily. 100 each 0  . Vitamin D, Ergocalciferol, (DRISDOL) 1.25 MG (50000 UT) CAPS capsule Take 1 capsule (50,000 Units total) by mouth every 14 (fourteen) days. 2 capsule 0  . TRI-LO-MARZIA 0.18/0.215/0.25 MG-25 MCG tab Take 1 tablet by mouth daily.  3   No current facility-administered medications on file prior to visit.      Objective:  Objective  Physical Exam Vitals signs and nursing note reviewed.  Constitutional:      Appearance:  She is well-developed.  HENT:     Head: Normocephalic and atraumatic.  Eyes:     Conjunctiva/sclera: Conjunctivae normal.  Neck:     Musculoskeletal: Normal range of motion and neck supple.     Thyroid: No thyromegaly.     Vascular: No carotid bruit or JVD.  Cardiovascular:     Rate and Rhythm: Normal rate and regular rhythm.     Heart sounds: Normal heart sounds. No murmur.  Pulmonary:     Effort: Pulmonary effort is normal. No respiratory distress.     Breath sounds: Normal breath sounds. No wheezing or rales.  Chest:     Chest wall: No tenderness.  Neurological:     Mental Status: She is alert and oriented to person, place, and time.  Psychiatric:        Mood and Affect: Mood normal.        Behavior: Behavior normal.        Thought Content: Thought content normal.        Judgment: Judgment normal.    BP 105/67 (BP Location: Left Arm, Patient Position: Sitting, Cuff Size: Normal)   Pulse 91   Temp 97.8 F (36.6 C) (Oral)   Resp 18   Ht 5\' 5"  (1.651 m)   Wt 165 lb 9.6 oz (75.1 kg)   SpO2 99%   BMI 27.56 kg/m  Wt Readings from Last 3 Encounters:  03/11/19 165 lb 9.6 oz (75.1 kg)  02/02/19 165 lb (74.8 kg)  10/11/18 173 lb (78.5 kg)     Lab Results  Component Value Date   WBC 6.8 04/29/2018   HGB 13.7 04/29/2018   HCT 42.3 04/29/2018   PLT 305 10/23/2016   GLUCOSE 115 (H) 03/11/2019   CHOL 133 03/11/2019   TRIG 67 03/11/2019   HDL 52 03/11/2019   LDLCALC 68 03/11/2019   ALT 33 (H) 03/11/2019   AST 17 03/11/2019   NA 141 03/11/2019   K 4.2 03/11/2019   CL 102 03/11/2019   CREATININE 0.77 03/11/2019   BUN 11 03/11/2019   CO2 22 03/11/2019   TSH 0.770 04/29/2018   HGBA1C 5.8 (H) 03/11/2019    Mm 3d Screen Breast Bilateral  Result Date: 08/05/2018 CLINICAL DATA:  Screening. EXAM: DIGITAL SCREENING BILATERAL MAMMOGRAM WITH TOMO AND CAD COMPARISON:  Previous exam(s). ACR Breast Density Category b: There are scattered areas of fibroglandular density.  FINDINGS: There are no findings suspicious for malignancy. Images were processed with CAD. IMPRESSION: No mammographic evidence of malignancy. A result letter of this screening mammogram will be mailed directly to the patient. RECOMMENDATION: Screening mammogram in one year. (Code:SM-B-01Y) BI-RADS CATEGORY  1: Negative. Electronically Signed   By: Claudie Revering M.D.   On: 08/05/2018 15:00     Assessment & Plan:  Plan  I am having Kenetra L. Shere start on Sulfacetamide Sodium (Acne). I am also having her  maintain her omeprazole, ALPRAZolam, Tri-Lo-Marzia, metroNIDAZOLE, hydrOXYzine, azelastine, BD Pen Needle Nano U/F, Vitamin D (Ergocalciferol), levocetirizine, buPROPion, Jardiance, OneTouch Delica Lancets 30G, glucose blood, atorvastatin, metFORMIN, lisdexamfetamine, lisdexamfetamine, lisdexamfetamine, DULoxetine, rizatriptan, and NONFORMULARY OR COMPOUNDED ITEM.  Meds ordered this encounter  Medications  . lisdexamfetamine (VYVANSE) 50 MG capsule    Sig: Take 1 capsule (50 mg total) by mouth daily.    Dispense:  30 capsule    Refill:  0    Do not fill until oct 2020  . lisdexamfetamine (VYVANSE) 50 MG capsule    Sig: Take 1 capsule (50 mg total) by mouth daily.    Dispense:  30 capsule    Refill:  0    Do not fill until sept 2020  . lisdexamfetamine (VYVANSE) 50 MG capsule    Sig: Take 1 capsule (50 mg total) by mouth daily.    Dispense:  30 capsule    Refill:  0  . DULoxetine (CYMBALTA) 60 MG capsule    Sig: Take 1 capsule (60 mg total) by mouth 2 (two) times daily.    Dispense:  180 capsule    Refill:  1  . rizatriptan (MAXALT-MLT) 10 MG disintegrating tablet    Sig: Take 1 tablet (10 mg total) by mouth as needed for migraine. May repeat in 2 hours if needed    Dispense:  10 tablet    Refill:  0  . NONFORMULARY OR COMPOUNDED ITEM    Sig: Tiger balm patches 1 patch daily prn    Dispense:  30 each    Refill:  5  . Sulfacetamide Sodium, Acne, 10 % LOTN    Sig: Use small amount  qhs    Dispense:  118 mL    Refill:  3    Problem List Items Addressed This Visit      Unprioritized   ADD (attention deficit disorder)    Stable Refill meds       Relevant Medications   lisdexamfetamine (VYVANSE) 50 MG capsule   lisdexamfetamine (VYVANSE) 50 MG capsule   lisdexamfetamine (VYVANSE) 50 MG capsule   Fibromyalgia    Stable Refill meds      Relevant Medications   DULoxetine (CYMBALTA) 60 MG capsule   NONFORMULARY OR COMPOUNDED ITEM   Nonintractable migraine, unspecified migraine type   Relevant Medications   DULoxetine (CYMBALTA) 60 MG capsule   rizatriptan (MAXALT-MLT) 10 MG disintegrating tablet    Other Visit Diagnoses    Acne, unspecified acne type    -  Primary   Relevant Medications   Sulfacetamide Sodium, Acne, 10 % LOTN      Follow-up: Return in about 6 months (around 09/11/2019).  Donato SchultzYvonne R Lowne Chase, DO

## 2019-03-12 LAB — COMPREHENSIVE METABOLIC PANEL
ALT: 33 IU/L — ABNORMAL HIGH (ref 0–32)
AST: 17 IU/L (ref 0–40)
Albumin/Globulin Ratio: 2.5 — ABNORMAL HIGH (ref 1.2–2.2)
Albumin: 4.8 g/dL (ref 3.8–4.8)
Alkaline Phosphatase: 102 IU/L (ref 39–117)
BUN/Creatinine Ratio: 14 (ref 9–23)
BUN: 11 mg/dL (ref 6–24)
Bilirubin Total: 0.4 mg/dL (ref 0.0–1.2)
CO2: 22 mmol/L (ref 20–29)
Calcium: 9.1 mg/dL (ref 8.7–10.2)
Chloride: 102 mmol/L (ref 96–106)
Creatinine, Ser: 0.77 mg/dL (ref 0.57–1.00)
GFR calc Af Amer: 107 mL/min/{1.73_m2} (ref >59)
GFR calc non Af Amer: 93 mL/min/{1.73_m2} (ref >59)
Globulin, Total: 1.9 g/dL (ref 1.5–4.5)
Glucose: 115 mg/dL — ABNORMAL HIGH (ref 65–99)
Potassium: 4.2 mmol/L (ref 3.5–5.2)
Sodium: 141 mmol/L (ref 134–144)
Total Protein: 6.7 g/dL (ref 6.0–8.5)

## 2019-03-12 LAB — VITAMIN D 25 HYDROXY (VIT D DEFICIENCY, FRACTURES): Vit D, 25-Hydroxy: 41.2 ng/mL (ref 30.0–100.0)

## 2019-03-12 LAB — HEMOGLOBIN A1C
Est. average glucose Bld gHb Est-mCnc: 120 mg/dL
Hgb A1c MFr Bld: 5.8 % — ABNORMAL HIGH (ref 4.8–5.6)

## 2019-03-12 LAB — LIPID PANEL WITH LDL/HDL RATIO
Cholesterol, Total: 133 mg/dL (ref 100–199)
HDL: 52 mg/dL (ref >39)
LDL Calculated: 68 mg/dL (ref 0–99)
LDl/HDL Ratio: 1.3 ratio (ref 0.0–3.2)
Triglycerides: 67 mg/dL (ref 0–149)
VLDL Cholesterol Cal: 13 mg/dL (ref 5–40)

## 2019-03-12 LAB — INSULIN, RANDOM: INSULIN: 7.6 u[IU]/mL (ref 2.6–24.9)

## 2019-03-12 NOTE — Assessment & Plan Note (Signed)
Stable. Refill meds

## 2019-03-14 ENCOUNTER — Telehealth (INDEPENDENT_AMBULATORY_CARE_PROVIDER_SITE_OTHER): Payer: BC Managed Care – PPO | Admitting: Physician Assistant

## 2019-03-14 ENCOUNTER — Other Ambulatory Visit: Payer: Self-pay

## 2019-03-14 DIAGNOSIS — E669 Obesity, unspecified: Secondary | ICD-10-CM

## 2019-03-14 DIAGNOSIS — Z6830 Body mass index (BMI) 30.0-30.9, adult: Secondary | ICD-10-CM

## 2019-03-14 DIAGNOSIS — E119 Type 2 diabetes mellitus without complications: Secondary | ICD-10-CM | POA: Diagnosis not present

## 2019-03-14 DIAGNOSIS — E559 Vitamin D deficiency, unspecified: Secondary | ICD-10-CM | POA: Diagnosis not present

## 2019-03-14 MED ORDER — GLUCOSE BLOOD VI STRP: ORAL_STRIP | 0 refills | Status: DC

## 2019-03-14 MED ORDER — JARDIANCE 25 MG PO TABS: 25.0000 mg | ORAL_TABLET | Freq: Every day | ORAL | 0 refills | Status: DC

## 2019-03-15 NOTE — Progress Notes (Signed)
Office: [phone]  /  Fax: [phone] TeleHealth Visit:  Jillian Green has verbally consented to this TeleHealth visit today. The patient is located at work, the provider is located at the [facility] office. The participants in this visit include the listed provider and patient. The visit was conducted today via Webex.  HPI:   Chief Complaint: OBESITY Jillian Green is here to discuss her progress with her obesity treatment plan. She is on the Category 3 plan and is following her eating plan approximately 75% of the time. She states she is walking up and down stairs.  Jillian Green reports her most recent weight to be 162 lbs. She states her hunger is controlled. She is working hard to get all of her food in on the plan.  We were unable to weigh the patient today for this TeleHealth visit. She feels as if she has lost 3 lbs since her last visit. She has lost 19 lbs since starting treatment with us.  Diabetes II Jillian Green has a diagnosis of diabetes type II. Briyana states fasting blood sugars range between 105 and 125. Last A1c was 5.8 on 03/11/2019. She has been working on intensive lifestyle modifications including diet, exercise, and weight loss to help control her blood glucose levels. No hypoglycemia.  Vitamin D deficiency Jillian Green has a diagnosis of Vitamin D deficiency. She is currently taking Vit D every other week and denies nausea, vomiting or muscle weakness.  ASSESSMENT AND PLAN:  Type 2 diabetes mellitus without complication, without long-term current use of insulin (HCC) - Plan: empagliflozin (JARDIANCE) 25 MG TABS tablet, glucose blood test strip  Vitamin D deficiency  Class 1 obesity with serious comorbidity and body mass index (BMI) of 30.0 to 30.9 in adult, unspecified obesity type  PLAN:  Diabetes II Jillian Green has been given extensive diabetes education by myself today including ideal fasting and post-prandial blood glucose readings, individual ideal Hgb A1c goals  and  hypoglycemia prevention. We discussed the importance of good blood sugar control to decrease the likelihood of diabetic complications such as nephropathy, neuropathy, limb loss, blindness, coronary artery disease, and death. We discussed the importance of intensive lifestyle modification including diet, exercise and weight loss as the first line treatment for diabetes. Jillian Green was given a refill on her Jardiance #30 with 0 refills and a refill on her test strips #100 with 0 refills. She agrees to follow-up with our clinic in 2 weeks.  Vitamin D Deficiency Jillian Green was informed that low Vitamin D levels contributes to fatigue and are associated with obesity, breast, and colon cancer. She will change to taking OTC Vit D @ 5,000 IU every day and will follow-up for routine testing of Vitamin D, at least 2-3 times per year. She was informed of the risk of over-replacement of Vitamin D and agrees to not increase her dose unless she discusses this with us first. Jillian Green agrees to follow-up with our clinic in 2 weeks.  Obesity Jillian Green is currently in the action stage of change. As such, her goal is to continue with weight loss efforts. She has agreed to follow the Category 3 plan. Jillian Green has been instructed to work up to a goal of 150 minutes of combined cardio and strengthening exercise per week for weight loss and overall health benefits. We discussed the following Behavioral Modification Strategies today: work on meal planning and easy cooking plans, and keeping healthy foods in the home.  Jillian Green has agreed to follow-up with our clinic in 2 weeks. She  was informed of the importance of frequent follow-up visits to maximize her success with intensive lifestyle modifications for her multiple health conditions.  ALLERGIES: Allergies  Allergen Reactions   Augmentin [Amoxicillin-Pot Clavulanate]     Severe yeast infections    MEDICATIONS: Current Outpatient Medications on File Prior to Visit  Medication Sig  Dispense Refill   ALPRAZolam (XANAX) 0.5 MG tablet Take 1 tablet (0.5 mg total) by mouth 3 (three) times daily as needed for anxiety. 45 tablet 1   atorvastatin (LIPITOR) 10 MG tablet TAKE 1 TABLET BY MOUTH EVERY DAY 30 tablet 0   azelastine (OPTIVAR) 0.05 % ophthalmic solution Place 1 drop into both eyes 2 (two) times daily as needed. 18 mL 1   BD PEN NEEDLE NANO U/F 32G X 4 MM MISC USE TWICE A DAY 100 each 0   buPROPion (WELLBUTRIN XL) 150 MG 24 hr tablet TAKE 1 TABLET BY MOUTH EVERY DAY 90 tablet 1   DULoxetine (CYMBALTA) 60 MG capsule Take 1 capsule (60 mg total) by mouth 2 (two) times daily. 180 capsule 1   hydrOXYzine (ATARAX/VISTARIL) 25 MG tablet TAKE 1 TO 2 TABLETS BY MOUTH AT BEDTIME AS NEEDED FOR SLEEP 30 tablet 0   levocetirizine (XYZAL) 5 MG tablet TAKE 1 TABLET BY MOUTH EVERY DAY IN THE EVENING 90 tablet 1   lisdexamfetamine (VYVANSE) 50 MG capsule Take 1 capsule (50 mg total) by mouth daily. 30 capsule 0   lisdexamfetamine (VYVANSE) 50 MG capsule Take 1 capsule (50 mg total) by mouth daily. 30 capsule 0   lisdexamfetamine (VYVANSE) 50 MG capsule Take 1 capsule (50 mg total) by mouth daily. 30 capsule 0   metFORMIN (GLUCOPHAGE) 1000 MG tablet Take 1 tablet (1,000 mg total) by mouth 2 (two) times daily with a meal. 60 tablet 0   metroNIDAZOLE (METROGEL) 1 % gel Apply topically daily. 45 g 0   NONFORMULARY OR COMPOUNDED ITEM Tiger balm patches 1 patch daily prn 30 each 5   omeprazole (PRILOSEC) 20 MG capsule Take 1 capsule (20 mg total) by mouth daily. 90 capsule 0   OneTouch Delica Lancets 62V MISC 1 Device by Does not apply route daily. 100 each 0   rizatriptan (MAXALT-MLT) 10 MG disintegrating tablet Take 1 tablet (10 mg total) by mouth as needed for migraine. May repeat in 2 hours if needed 10 tablet 0   Sulfacetamide Sodium, Acne, 10 % LOTN Use small amount qhs 118 mL 3   TRI-LO-MARZIA 0.18/0.215/0.25 MG-25 MCG tab Take 1 tablet by mouth daily.  3   Vitamin D,  Ergocalciferol, (DRISDOL) 1.25 MG (50000 UT) CAPS capsule Take 1 capsule (50,000 Units total) by mouth every 14 (fourteen) days. 2 capsule 0   No current facility-administered medications on file prior to visit.     PAST MEDICAL HISTORY: Past Medical History:  Diagnosis Date   ADD (attention deficit disorder)    Anxiety    Back pain    Chronic headaches    Depression    Fibromyalgia    GERD (gastroesophageal reflux disease)    Itchy skin    Joint pain    Lactose intolerance    Migraine    Sweating increase    Type 2 diabetes mellitus without complication, without long-term current use of insulin ([facility]) 09/13/2018   Unspecified eustachian tube disorder     PAST SURGICAL HISTORY: Past Surgical History:  Procedure Laterality Date   CESAREAN SECTION     OTHER SURGICAL HISTORY     preforated  uterine surgery   SINUS EXPLORATION      SOCIAL HISTORY: Social History   Tobacco Use   Smoking status: Former Smoker    Years: 8.00    Quit date: 05/15/2006    Years since quitting: 12.8   Smokeless tobacco: Never Used   Tobacco comment: 1 cig every 4 months  Substance Use Topics   Alcohol use: Yes    Alcohol/week: 1.0 standard drinks    Types: 1 Glasses of wine per week   Drug use: No    FAMILY HISTORY: Family History  Problem Relation Age of Onset   Other Father        Dyslexia   Heart disease Father    Hypertension Father    Alcoholism Father    Hypertension Mother    Thyroid disease Mother    Anxiety disorder Mother    Sleep apnea Mother    Obesity Mother    ADD / ADHD Sister    ROS: Review of Systems  Gastrointestinal: Negative for nausea and vomiting.  Musculoskeletal:       Negative for muscle weakness.  Endo/Heme/Allergies:       Negative for hypoglycemia.   PHYSICAL EXAM: Pt in no acute distress  RECENT LABS AND TESTS: BMET    Component Value Date/Time   NA 141 03/11/2019 1119   K 4.2 03/11/2019 1119   CL 102  03/11/2019 1119   CO2 22 03/11/2019 1119   GLUCOSE 115 (H) 03/11/2019 1119   GLUCOSE 187 (H) 10/23/2016 1348   BUN 11 03/11/2019 1119   CREATININE 0.77 03/11/2019 1119   CALCIUM 9.1 03/11/2019 1119   GFRNONAA 93 03/11/2019 1119   GFRAA 107 03/11/2019 1119   Lab Results  Component Value Date   HGBA1C 5.8 (H) 03/11/2019   HGBA1C 7.1 (H) 09/13/2018   HGBA1C 7.7 (H) 04/29/2018   Lab Results  Component Value Date   INSULIN 7.6 03/11/2019   INSULIN 15.9 09/13/2018   INSULIN 18.4 04/29/2018   CBC    Component Value Date/Time   WBC 6.8 04/29/2018 1311   WBC 8.0 10/23/2016 1348   RBC 4.44 04/29/2018 1311   RBC 4.31 10/23/2016 1348   HGB 13.7 04/29/2018 1311   HCT 42.3 04/29/2018 1311   PLT 305 10/23/2016 1348   MCV 95 04/29/2018 1311   MCH 30.9 04/29/2018 1311   MCH 31.8 10/23/2016 1348   MCHC 32.4 04/29/2018 1311   MCHC 33.9 10/23/2016 1348   RDW 12.8 04/29/2018 1311   LYMPHSABS 2.2 04/29/2018 1311   MONOABS 0.3 09/23/2013 0950   EOSABS 0.2 04/29/2018 1311   BASOSABS 0.0 04/29/2018 1311   Iron/TIBC/Ferritin/ %Sat    Component Value Date/Time   IRON 156 (H) 09/23/2013 0950   FERRITIN 103.5 09/23/2013 0950   IRONPCTSAT 38.0 09/23/2013 0950   Lipid Panel     Component Value Date/Time   CHOL 133 03/11/2019 1119   TRIG 67 03/11/2019 1119   HDL 52 03/11/2019 1119   LDLCALC 68 03/11/2019 1119   Hepatic Function Panel     Component Value Date/Time   PROT 6.7 03/11/2019 1119   ALBUMIN 4.8 03/11/2019 1119   AST 17 03/11/2019 1119   ALT 33 (H) 03/11/2019 1119   ALKPHOS 102 03/11/2019 1119   BILITOT 0.4 03/11/2019 1119   BILIDIR 0.0 09/23/2013 0950      Component Value Date/Time   TSH 0.770 04/29/2018 1311   TSH 1.63 05/15/2016 1557   TSH 0.99 09/23/2013 0950   Results for  Jillian SiresGREENAWALT, Javionna L (MRN [id number]) as of 03/15/2019 12:09  Ref. Range 03/11/2019 11:19  Vitamin D, 25-Hydroxy Latest Ref Range: 30.0 - 100.0 ng/mL 41.2   I, Marianna Paymentenise Haag, am acting as  Energy managertranscriptionist for [employer] Aguilar, PA-C I, Alois Clicheracey Aguilar, PA-C have reviewed above note and agree with its content

## 2019-03-23 ENCOUNTER — Encounter (INDEPENDENT_AMBULATORY_CARE_PROVIDER_SITE_OTHER): Payer: Self-pay

## 2019-03-28 ENCOUNTER — Telehealth (INDEPENDENT_AMBULATORY_CARE_PROVIDER_SITE_OTHER): Payer: Self-pay | Admitting: Physician Assistant

## 2019-03-31 ENCOUNTER — Other Ambulatory Visit (INDEPENDENT_AMBULATORY_CARE_PROVIDER_SITE_OTHER): Payer: Self-pay | Admitting: Physician Assistant

## 2019-03-31 DIAGNOSIS — E119 Type 2 diabetes mellitus without complications: Secondary | ICD-10-CM

## 2019-04-04 ENCOUNTER — Other Ambulatory Visit (INDEPENDENT_AMBULATORY_CARE_PROVIDER_SITE_OTHER): Payer: Self-pay | Admitting: Physician Assistant

## 2019-04-04 DIAGNOSIS — E119 Type 2 diabetes mellitus without complications: Secondary | ICD-10-CM

## 2019-04-05 ENCOUNTER — Encounter (INDEPENDENT_AMBULATORY_CARE_PROVIDER_SITE_OTHER): Payer: Self-pay | Admitting: Physician Assistant

## 2019-04-05 DIAGNOSIS — B379 Candidiasis, unspecified: Secondary | ICD-10-CM

## 2019-04-05 DIAGNOSIS — E119 Type 2 diabetes mellitus without complications: Secondary | ICD-10-CM

## 2019-04-05 MED ORDER — JARDIANCE 25 MG PO TABS: 25.0000 mg | ORAL_TABLET | Freq: Every day | ORAL | 0 refills | Status: DC

## 2019-04-05 MED ORDER — METFORMIN HCL 1000 MG PO TABS: 1000.0000 mg | ORAL_TABLET | Freq: Two times a day (BID) | ORAL | 0 refills | Status: DC

## 2019-04-05 MED ORDER — FLUCONAZOLE 150 MG PO TABS: 150.0000 mg | ORAL_TABLET | Freq: Once | ORAL | 1 refills | Status: AC

## 2019-04-05 NOTE — Telephone Encounter (Signed)
Please advise 

## 2019-04-05 NOTE — Telephone Encounter (Signed)
Ok to send metformin and jardiance. Also send diflucan 150mg   1 tab po #1 0RF. , and you can let her know we are sending. Thanks!

## 2019-04-05 NOTE — Telephone Encounter (Signed)
Done

## 2019-04-12 ENCOUNTER — Encounter: Payer: Self-pay | Admitting: Family Medicine

## 2019-04-12 ENCOUNTER — Other Ambulatory Visit: Payer: Self-pay | Admitting: Family Medicine

## 2019-04-12 DIAGNOSIS — F419 Anxiety disorder, unspecified: Secondary | ICD-10-CM

## 2019-04-12 DIAGNOSIS — F909 Attention-deficit hyperactivity disorder, unspecified type: Secondary | ICD-10-CM

## 2019-04-12 MED ORDER — LISDEXAMFETAMINE DIMESYLATE 50 MG PO CAPS: 50.0000 mg | ORAL_CAPSULE | Freq: Every day | ORAL | 0 refills | Status: DC

## 2019-04-12 MED ORDER — ALPRAZOLAM 0.5 MG PO TABS: 0.5000 mg | ORAL_TABLET | Freq: Three times a day (TID) | ORAL | 1 refills | Status: DC | PRN

## 2019-04-12 NOTE — Telephone Encounter (Signed)
meds were sent in

## 2019-04-18 ENCOUNTER — Encounter (INDEPENDENT_AMBULATORY_CARE_PROVIDER_SITE_OTHER): Payer: Self-pay | Admitting: Physician Assistant

## 2019-04-18 ENCOUNTER — Telehealth: Payer: Self-pay | Admitting: Family Medicine

## 2019-04-18 ENCOUNTER — Other Ambulatory Visit: Payer: Self-pay

## 2019-04-18 ENCOUNTER — Telehealth (INDEPENDENT_AMBULATORY_CARE_PROVIDER_SITE_OTHER): Payer: BC Managed Care – PPO | Admitting: Physician Assistant

## 2019-04-18 ENCOUNTER — Encounter: Payer: Self-pay | Admitting: Family Medicine

## 2019-04-18 DIAGNOSIS — E119 Type 2 diabetes mellitus without complications: Secondary | ICD-10-CM

## 2019-04-18 DIAGNOSIS — E7849 Other hyperlipidemia: Secondary | ICD-10-CM

## 2019-04-18 DIAGNOSIS — Z6830 Body mass index (BMI) 30.0-30.9, adult: Secondary | ICD-10-CM | POA: Diagnosis not present

## 2019-04-18 DIAGNOSIS — E669 Obesity, unspecified: Secondary | ICD-10-CM

## 2019-04-18 NOTE — Telephone Encounter (Signed)
Hi.  My forehead is itching, enough to drive me crazy.  I have tried several things to try to clear it up but nothing is helping.  I tried the sulfate lotion i asked you to prescribed, the metrogel i asked you to prescribed, otc cortisone, and anti- fungal cream, Lotimin AF.    It's just little red bumps that continually itch.  I also changed my cleanser to Cetaphyl,     but still itching.  I went without makeup, no change...  What do I do now?  I sent a pix so you could see it. If there is something I can try, I am using [facility] on [address] in [place].  # is [phone]. Thanks!  Merleen

## 2019-04-18 NOTE — Telephone Encounter (Signed)
We can refer her to derm if she agrees

## 2019-04-19 NOTE — Telephone Encounter (Signed)
Replied via Mychart.

## 2019-04-20 NOTE — Progress Notes (Signed)
Office: [phone]  /  Fax: [phone] TeleHealth Visit:  Brendia Sacks has verbally consented to this TeleHealth visit today. The patient is located at home, the provider is located at the [facility] office. The participants in this visit include the listed provider and patient and any and all parties involved. The visit was conducted today via WebEx.  HPI:   Chief Complaint: OBESITY Emonni is here to discuss her progress with her obesity treatment plan. She is on the Category 3 plan and is following her eating plan approximately 50 % of the time. She states she is exercising 0 minutes 0 times per week. Kamee's most recent weight was 161 pounds (04/18/19). She reports being off the plan recently, and has been eating french fries and doughnuts at times. Birdell is ready to get back on track. We were unable to weigh the patient today for this TeleHealth visit. She feels as if she has gained weight since her last visit. She has lost 19 lbs since starting treatment with [place].  Diabetes II [place] has a diagnosis of diabetes type II. She is on Jardiance. Oreatha states fasting BGs range between 112 and 140, but averages between 115 and 120 and she denies any hypoglycemic episodes. Last A1c was at 5.8 She has been working on intensive lifestyle modifications including diet, exercise, and weight loss to help control her blood glucose levels.  Hyperlipidemia Yahayra has hyperlipidemia and she is on Atorvastatin. She has been trying to improve her cholesterol levels with intensive lifestyle modification including a low saturated fat diet, exercise and weight loss. She denies any chest pain.  ASSESSMENT AND PLAN:  No diagnosis found.  PLAN:  Diabetes II Miku has been given extensive diabetes education by myself today including ideal fasting and post-prandial blood glucose readings, individual ideal Hgb A1c goals and hypoglycemia prevention. We discussed the importance of good blood  sugar control to decrease the likelihood of diabetic complications such as nephropathy, neuropathy, limb loss, blindness, coronary artery disease, and death. We discussed the importance of intensive lifestyle modification including diet, exercise and weight loss as the first line treatment for diabetes. Reagen agrees to continue Jardiance 25 mg once daily #30 with no refills and follow up at the agreed upon time.  Hyperlipidemia Arlenis was informed of the American Heart Association Guidelines emphasizing intensive lifestyle modifications as the first line treatment for hyperlipidemia. We discussed many lifestyle modifications today in depth, and Bradie will continue to work on decreasing saturated fats such as fatty red meat, butter and many fried foods. She will also increase vegetables and lean protein in her diet and continue to work on exercise and weight loss efforts. Auriella agrees to continue Atorvastatin 10 mg once daily #30 with no refills and follow up as directed.  Obesity Fransisca is currently in the action stage of change. As such, her goal is to continue with weight loss efforts She has agreed to follow the Category 3 plan Kayda has been instructed to work up to a goal of 150 minutes of combined cardio and strengthening exercise per week for weight loss and overall health benefits. We discussed the following Behavioral Modification Strategies today: keeping healthy foods in the home and work on meal planning and easy cooking plans  Darchelle has agreed to follow up with our clinic in 2 weeks. She was informed of the importance of frequent follow up visits to maximize her success with intensive lifestyle modifications for her multiple health conditions.  ALLERGIES: Allergies  Allergen Reactions   Augmentin [Amoxicillin-Pot Clavulanate]     Severe yeast infections    MEDICATIONS: Current Outpatient Medications on File Prior to Visit  Medication Sig Dispense Refill   ALPRAZolam (XANAX) 0.5  MG tablet Take 1 tablet (0.5 mg total) by mouth 3 (three) times daily as needed for anxiety. 45 tablet 1   atorvastatin (LIPITOR) 10 MG tablet TAKE 1 TABLET BY MOUTH EVERY DAY 30 tablet 0   azelastine (OPTIVAR) 0.05 % ophthalmic solution Place 1 drop into both eyes 2 (two) times daily as needed. 18 mL 1   BD PEN NEEDLE NANO U/F 32G X 4 MM MISC USE TWICE A DAY 100 each 0   buPROPion (WELLBUTRIN XL) 150 MG 24 hr tablet TAKE 1 TABLET BY MOUTH EVERY DAY 90 tablet 1   DULoxetine (CYMBALTA) 60 MG capsule Take 1 capsule (60 mg total) by mouth 2 (two) times daily. 180 capsule 1   empagliflozin (JARDIANCE) 25 MG TABS tablet Take 25 mg by mouth daily. 30 tablet 0   glucose blood test strip Use as instructed 100 each 0   hydrOXYzine (ATARAX/VISTARIL) 25 MG tablet TAKE 1 TO 2 TABLETS BY MOUTH AT BEDTIME AS NEEDED FOR SLEEP 30 tablet 0   levocetirizine (XYZAL) 5 MG tablet TAKE 1 TABLET BY MOUTH EVERY DAY IN THE EVENING 90 tablet 1   lisdexamfetamine (VYVANSE) 50 MG capsule Take 1 capsule (50 mg total) by mouth daily. 30 capsule 0   lisdexamfetamine (VYVANSE) 50 MG capsule Take 1 capsule (50 mg total) by mouth daily. 30 capsule 0   lisdexamfetamine (VYVANSE) 50 MG capsule Take 1 capsule (50 mg total) by mouth daily. 30 capsule 0   metFORMIN (GLUCOPHAGE) 1000 MG tablet Take 1 tablet (1,000 mg total) by mouth 2 (two) times daily with a meal. 60 tablet 0   metroNIDAZOLE (METROGEL) 1 % gel Apply topically daily. 45 g 0   NONFORMULARY OR COMPOUNDED ITEM Tiger balm patches 1 patch daily prn 30 each 5   omeprazole (PRILOSEC) 20 MG capsule Take 1 capsule (20 mg total) by mouth daily. 90 capsule 0   OneTouch Delica Lancets 30G MISC 1 Device by Does not apply route daily. 100 each 0   rizatriptan (MAXALT-MLT) 10 MG disintegrating tablet Take 1 tablet (10 mg total) by mouth as needed for migraine. May repeat in 2 hours if needed 10 tablet 0   Sulfacetamide Sodium, Acne, 10 % LOTN Use small amount qhs  118 mL 3   TRI-LO-MARZIA 0.18/0.215/0.25 MG-25 MCG tab Take 1 tablet by mouth daily.  3   Vitamin D, Ergocalciferol, (DRISDOL) 1.25 MG (50000 UT) CAPS capsule Take 1 capsule (50,000 Units total) by mouth every 14 (fourteen) days. 2 capsule 0   No current facility-administered medications on file prior to visit.     PAST MEDICAL HISTORY: Past Medical History:  Diagnosis Date   ADD (attention deficit disorder)    Anxiety    Back pain    Chronic headaches    Depression    Fibromyalgia    GERD (gastroesophageal reflux disease)    Itchy skin    Joint pain    Lactose intolerance    Migraine    Sweating increase    Type 2 diabetes mellitus without complication, without long-term current use of insulin (HCC) 09/13/2018   Unspecified eustachian tube disorder     PAST SURGICAL HISTORY: Past Surgical History:  Procedure Laterality Date   CESAREAN SECTION     OTHER SURGICAL HISTORY  preforated uterine surgery   SINUS EXPLORATION      SOCIAL HISTORY: Social History   Tobacco Use   Smoking status: Former Smoker    Years: 8.00    Quit date: 05/15/2006    Years since quitting: 12.9   Smokeless tobacco: Never Used   Tobacco comment: 1 cig every 4 months  Substance Use Topics   Alcohol use: Yes    Alcohol/week: 1.0 standard drinks    Types: 1 Glasses of wine per week   Drug use: No    FAMILY HISTORY: Family History  Problem Relation Age of Onset   Other Father        Dyslexia   Heart disease Father    Hypertension Father    Alcoholism Father    Hypertension Mother    Thyroid disease Mother    Anxiety disorder Mother    Sleep apnea Mother    Obesity Mother    ADD / ADHD Sister     ROS: Review of Systems  Constitutional: Negative for weight loss.  Cardiovascular: Negative for chest pain.  Endo/Heme/Allergies:       Negative for hypoglycemia    PHYSICAL EXAM: Pt in no acute distress  RECENT LABS AND TESTS: BMET      Component Value Date/Time   NA 141 03/11/2019 1119   K 4.2 03/11/2019 1119   CL 102 03/11/2019 1119   CO2 22 03/11/2019 1119   GLUCOSE 115 (H) 03/11/2019 1119   GLUCOSE 187 (H) 10/23/2016 1348   BUN 11 03/11/2019 1119   CREATININE 0.77 03/11/2019 1119   CALCIUM 9.1 03/11/2019 1119   GFRNONAA 93 03/11/2019 1119   GFRAA 107 03/11/2019 1119   Lab Results  Component Value Date   HGBA1C 5.8 (H) 03/11/2019   HGBA1C 7.1 (H) 09/13/2018   HGBA1C 7.7 (H) 04/29/2018   Lab Results  Component Value Date   INSULIN 7.6 03/11/2019   INSULIN 15.9 09/13/2018   INSULIN 18.4 04/29/2018   CBC    Component Value Date/Time   WBC 6.8 04/29/2018 1311   WBC 8.0 10/23/2016 1348   RBC 4.44 04/29/2018 1311   RBC 4.31 10/23/2016 1348   HGB 13.7 04/29/2018 1311   HCT 42.3 04/29/2018 1311   PLT 305 10/23/2016 1348   MCV 95 04/29/2018 1311   MCH 30.9 04/29/2018 1311   MCH 31.8 10/23/2016 1348   MCHC 32.4 04/29/2018 1311   MCHC 33.9 10/23/2016 1348   RDW 12.8 04/29/2018 1311   LYMPHSABS 2.2 04/29/2018 1311   MONOABS 0.3 09/23/2013 0950   EOSABS 0.2 04/29/2018 1311   BASOSABS 0.0 04/29/2018 1311   Iron/TIBC/Ferritin/ %Sat    Component Value Date/Time   IRON 156 (H) 09/23/2013 0950   FERRITIN 103.5 09/23/2013 0950   IRONPCTSAT 38.0 09/23/2013 0950   Lipid Panel     Component Value Date/Time   CHOL 133 03/11/2019 1119   TRIG 67 03/11/2019 1119   HDL 52 03/11/2019 1119   LDLCALC 68 03/11/2019 1119   Hepatic Function Panel     Component Value Date/Time   PROT 6.7 03/11/2019 1119   ALBUMIN 4.8 03/11/2019 1119   AST 17 03/11/2019 1119   ALT 33 (H) 03/11/2019 1119   ALKPHOS 102 03/11/2019 1119   BILITOT 0.4 03/11/2019 1119   BILIDIR 0.0 09/23/2013 0950      Component Value Date/Time   TSH 0.770 04/29/2018 1311   TSH 1.63 05/15/2016 1557   TSH 0.99 09/23/2013 0950     Ref. Range 03/11/2019  11:19  Vitamin D, 25-Hydroxy Latest Ref Range: 30.0 - 100.0 ng/mL 41.2    I, [place] CraneJoanne  Murray, am acting as Energy managertranscriptionist for Alois Clicheracey Aguilar, PA-C I, Alois Clicheracey Aguilar, PA-C have reviewed above note and agree with its content

## 2019-04-28 ENCOUNTER — Other Ambulatory Visit (INDEPENDENT_AMBULATORY_CARE_PROVIDER_SITE_OTHER): Payer: Self-pay | Admitting: Physician Assistant

## 2019-04-28 DIAGNOSIS — E119 Type 2 diabetes mellitus without complications: Secondary | ICD-10-CM

## 2019-05-02 ENCOUNTER — Encounter (INDEPENDENT_AMBULATORY_CARE_PROVIDER_SITE_OTHER): Payer: Self-pay | Admitting: Physician Assistant

## 2019-05-02 ENCOUNTER — Other Ambulatory Visit (INDEPENDENT_AMBULATORY_CARE_PROVIDER_SITE_OTHER): Payer: Self-pay | Admitting: Physician Assistant

## 2019-05-02 ENCOUNTER — Other Ambulatory Visit: Payer: Self-pay

## 2019-05-02 ENCOUNTER — Telehealth (INDEPENDENT_AMBULATORY_CARE_PROVIDER_SITE_OTHER): Payer: BC Managed Care – PPO | Admitting: Physician Assistant

## 2019-05-02 DIAGNOSIS — Z6830 Body mass index (BMI) 30.0-30.9, adult: Secondary | ICD-10-CM | POA: Diagnosis not present

## 2019-05-02 DIAGNOSIS — E119 Type 2 diabetes mellitus without complications: Secondary | ICD-10-CM | POA: Diagnosis not present

## 2019-05-02 DIAGNOSIS — E669 Obesity, unspecified: Secondary | ICD-10-CM

## 2019-05-03 NOTE — Progress Notes (Signed)
Office: [phone]  /  Fax: [phone] TeleHealth Visit:  Jillian Green has verbally consented to this TeleHealth visit today. The patient is located at work, the provider is located at the [facility] office. The participants in this visit include the listed provider and patient. The visit was conducted today via webex.  HPI:   Chief Complaint: OBESITY Jillian Green is here to discuss her progress with her obesity treatment plan. She is on the  follow the Category 3 plan and is following her eating plan approximately 70 % of the time. She states she is exercising 0 minutes 0 times per week. Jillian Green most recent weight is at 160 lbs at home. She reports doing a better job getting in her protein. She wants to start exercising more.  We were unable to weigh the patient today for this TeleHealth visit. She feels as if she has lost weight since her last visit. She has lost 19 lbs since starting treatment with us.  Diabetes II Jillian Green has a diagnosis of diabetes type II. Jillian Green states fasting BGs range between 94 and 119 and denies any hypoglycemic episodes, UTI, or yeast infection. She is currently on Jardiance and Metformin. Last A1c was 5.8.  She has been working on intensive lifestyle modifications including diet, exercise, and weight loss to help control her blood glucose levels.  ASSESSMENT AND PLAN:  Type 2 diabetes mellitus without complication, without long-term current use of insulin (HCC)  Class 1 obesity with serious comorbidity and body mass index (BMI) of 30.0 to 30.9 in adult, unspecified obesity type  PLAN: Diabetes II Jillian Green has been given extensive diabetes education by myself today including ideal fasting and post-prandial blood glucose readings, individual ideal HgA1c goals  and hypoglycemia prevention. We discussed the importance of good blood sugar control to decrease the likelihood of diabetic complications such as nephropathy, neuropathy, limb loss, blindness,  coronary artery disease, and death. We discussed the importance of intensive lifestyle modification including diet, exercise and weight loss as the first line treatment for diabetes. Jillian Green agrees to continue her diabetes medications and will follow up at the agreed upon time.  I spent > than 50% of the 25 minute visit on counseling as documented in the note.  Obesity Jillian Green is currently in the action stage of change. As such, her goal is to continue with weight loss efforts She has agreed to keep a food journal with 1500 calories and 95g of protein daily.  Jillian Green has been instructed to work up to a goal of 150 minutes of combined cardio and strengthening exercise per week for weight loss and overall health benefits. We discussed the following Behavioral Modification Strategies today: work on meal planning and easy cooking plans and keeping healthy foods in the home.    Jillian Green has agreed to follow up with our clinic in 2 weeks. She was informed of the importance of frequent follow up visits to maximize her success with intensive lifestyle modifications for her multiple health conditions.  I spent > than 50% of the 25 minute visit on counseling as documented in the note.   ALLERGIES: Allergies  Allergen Reactions  . Augmentin [Amoxicillin-Pot Clavulanate]     Severe yeast infections    MEDICATIONS: Current Outpatient Medications on File Prior to Visit  Medication Sig Dispense Refill  . ALPRAZolam (XANAX) 0.5 MG tablet Take 1 tablet (0.5 mg total) by mouth 3 (three) times daily as needed for anxiety. 45 tablet 1  . atorvastatin (LIPITOR) 10 MG  tablet TAKE 1 TABLET BY MOUTH EVERY DAY 30 tablet 0  . azelastine (OPTIVAR) 0.05 % ophthalmic solution Place 1 drop into both eyes 2 (two) times daily as needed. 18 mL 1  . BD PEN NEEDLE NANO U/F 32G X 4 MM MISC USE TWICE A DAY 100 each 0  . buPROPion (WELLBUTRIN XL) 150 MG 24 hr tablet TAKE 1 TABLET BY MOUTH EVERY DAY 90 tablet 1  . DULoxetine  (CYMBALTA) 60 MG capsule Take 1 capsule (60 mg total) by mouth 2 (two) times daily. 180 capsule 1  . empagliflozin (JARDIANCE) 25 MG TABS tablet Take 25 mg by mouth daily. 30 tablet 0  . glucose blood test strip Use as instructed 100 each 0  . hydrOXYzine (ATARAX/VISTARIL) 25 MG tablet TAKE 1 TO 2 TABLETS BY MOUTH AT BEDTIME AS NEEDED FOR SLEEP 30 tablet 0  . levocetirizine (XYZAL) 5 MG tablet TAKE 1 TABLET BY MOUTH EVERY DAY IN THE EVENING 90 tablet 1  . lisdexamfetamine (VYVANSE) 50 MG capsule Take 1 capsule (50 mg total) by mouth daily. 30 capsule 0  . lisdexamfetamine (VYVANSE) 50 MG capsule Take 1 capsule (50 mg total) by mouth daily. 30 capsule 0  . lisdexamfetamine (VYVANSE) 50 MG capsule Take 1 capsule (50 mg total) by mouth daily. 30 capsule 0  . metFORMIN (GLUCOPHAGE) 1000 MG tablet Take 1 tablet (1,000 mg total) by mouth 2 (two) times daily with a meal. 60 tablet 0  . metroNIDAZOLE (METROGEL) 1 % gel Apply topically daily. 45 g 0  . NONFORMULARY OR COMPOUNDED ITEM Tiger balm patches 1 patch daily prn 30 each 5  . omeprazole (PRILOSEC) 20 MG capsule Take 1 capsule (20 mg total) by mouth daily. 90 capsule 0  . OneTouch Delica Lancets 85Y MISC 1 Device by Does not apply route daily. 100 each 0  . rizatriptan (MAXALT-MLT) 10 MG disintegrating tablet Take 1 tablet (10 mg total) by mouth as needed for migraine. May repeat in 2 hours if needed 10 tablet 0  . Sulfacetamide Sodium, Acne, 10 % LOTN Use small amount qhs 118 mL 3  . TRI-LO-MARZIA 0.18/0.215/0.25 MG-25 MCG tab Take 1 tablet by mouth daily.  3  . Vitamin D, Ergocalciferol, (DRISDOL) 1.25 MG (50000 UT) CAPS capsule Take 1 capsule (50,000 Units total) by mouth every 14 (fourteen) days. 2 capsule 0   No current facility-administered medications on file prior to visit.     PAST MEDICAL HISTORY: Past Medical History:  Diagnosis Date  . ADD (attention deficit disorder)   . Anxiety   . Back pain   . Chronic headaches   . Depression    . Fibromyalgia   . GERD (gastroesophageal reflux disease)   . Itchy skin   . Joint pain   . Lactose intolerance   . Migraine   . Sweating increase   . Type 2 diabetes mellitus without complication, without long-term current use of insulin ([place]) 09/13/2018  . Unspecified eustachian tube disorder     PAST SURGICAL HISTORY: Past Surgical History:  Procedure Laterality Date  . CESAREAN SECTION    . OTHER SURGICAL HISTORY     preforated uterine surgery  . SINUS EXPLORATION      SOCIAL HISTORY: Social History   Tobacco Use  . Smoking status: Former Smoker    Years: 8.00    Quit date: 05/15/2006    Years since quitting: 12.9  . Smokeless tobacco: Never Used  . Tobacco comment: 1 cig every 4 months  Substance  Use Topics  . Alcohol use: Yes    Alcohol/week: 1.0 standard drinks    Types: 1 Glasses of wine per week  . Drug use: No    FAMILY HISTORY: Family History  Problem Relation Age of Onset  . Other Father        Dyslexia  . Heart disease Father   . Hypertension Father   . Alcoholism Father   . Hypertension Mother   . Thyroid disease Mother   . Anxiety disorder Mother   . Sleep apnea Mother   . Obesity Mother   . ADD / ADHD Sister     ROS: Review of Systems  Endo/Heme/Allergies:       Negative for hypoglycemia    PHYSICAL EXAM: Pt in no acute distress  RECENT LABS AND TESTS: BMET    Component Value Date/Time   NA 141 03/11/2019 1119   K 4.2 03/11/2019 1119   CL 102 03/11/2019 1119   CO2 22 03/11/2019 1119   GLUCOSE 115 (H) 03/11/2019 1119   GLUCOSE 187 (H) 10/23/2016 1348   BUN 11 03/11/2019 1119   CREATININE 0.77 03/11/2019 1119   CALCIUM 9.1 03/11/2019 1119   GFRNONAA 93 03/11/2019 1119   GFRAA 107 03/11/2019 1119   Lab Results  Component Value Date   HGBA1C 5.8 (H) 03/11/2019   HGBA1C 7.1 (H) 09/13/2018   HGBA1C 7.7 (H) 04/29/2018   Lab Results  Component Value Date   INSULIN 7.6 03/11/2019   INSULIN 15.9 09/13/2018   INSULIN 18.4  04/29/2018   CBC    Component Value Date/Time   WBC 6.8 04/29/2018 1311   WBC 8.0 10/23/2016 1348   RBC 4.44 04/29/2018 1311   RBC 4.31 10/23/2016 1348   HGB 13.7 04/29/2018 1311   HCT 42.3 04/29/2018 1311   PLT 305 10/23/2016 1348   MCV 95 04/29/2018 1311   MCH 30.9 04/29/2018 1311   MCH 31.8 10/23/2016 1348   MCHC 32.4 04/29/2018 1311   MCHC 33.9 10/23/2016 1348   RDW 12.8 04/29/2018 1311   LYMPHSABS 2.2 04/29/2018 1311   MONOABS 0.3 09/23/2013 0950   EOSABS 0.2 04/29/2018 1311   BASOSABS 0.0 04/29/2018 1311   Iron/TIBC/Ferritin/ %Sat    Component Value Date/Time   IRON 156 (H) 09/23/2013 0950   FERRITIN 103.5 09/23/2013 0950   IRONPCTSAT 38.0 09/23/2013 0950   Lipid Panel     Component Value Date/Time   CHOL 133 03/11/2019 1119   TRIG 67 03/11/2019 1119   HDL 52 03/11/2019 1119   LDLCALC 68 03/11/2019 1119   Hepatic Function Panel     Component Value Date/Time   PROT 6.7 03/11/2019 1119   ALBUMIN 4.8 03/11/2019 1119   AST 17 03/11/2019 1119   ALT 33 (H) 03/11/2019 1119   ALKPHOS 102 03/11/2019 1119   BILITOT 0.4 03/11/2019 1119   BILIDIR 0.0 09/23/2013 0950      Component Value Date/Time   TSH 0.770 04/29/2018 1311   TSH 1.63 05/15/2016 1557   TSH 0.99 09/23/2013 0950      I, Jeralene Peters, am acting as Energy manager for Alois Cliche, PA-C  I, Alois Cliche, PA-C have reviewed above note and agree with its content

## 2019-05-09 ENCOUNTER — Encounter (INDEPENDENT_AMBULATORY_CARE_PROVIDER_SITE_OTHER): Payer: Self-pay | Admitting: Physician Assistant

## 2019-05-09 DIAGNOSIS — E119 Type 2 diabetes mellitus without complications: Secondary | ICD-10-CM

## 2019-05-10 ENCOUNTER — Encounter: Payer: Self-pay | Admitting: Family Medicine

## 2019-05-10 ENCOUNTER — Other Ambulatory Visit: Payer: Self-pay | Admitting: Family Medicine

## 2019-05-10 DIAGNOSIS — F909 Attention-deficit hyperactivity disorder, unspecified type: Secondary | ICD-10-CM

## 2019-05-10 DIAGNOSIS — F988 Other specified behavioral and emotional disorders with onset usually occurring in childhood and adolescence: Secondary | ICD-10-CM

## 2019-05-10 MED ORDER — JARDIANCE 25 MG PO TABS: 25.0000 mg | ORAL_TABLET | Freq: Every day | ORAL | 0 refills | Status: DC

## 2019-05-10 MED ORDER — LISDEXAMFETAMINE DIMESYLATE 50 MG PO CAPS: 50.0000 mg | ORAL_CAPSULE | Freq: Every day | ORAL | 0 refills | Status: DC

## 2019-05-10 MED ORDER — METFORMIN HCL 1000 MG PO TABS: 1000.0000 mg | ORAL_TABLET | Freq: Two times a day (BID) | ORAL | 0 refills | Status: DC

## 2019-05-10 NOTE — Telephone Encounter (Signed)
Requesting: Vyvanse Contract: 08/05/2018 UDS: 08/05/2018 Last OV: 03/11/2019 Next OV: N/A Last Refill: 04/12/2019, #30--0 RF Database:   Please advise

## 2019-05-23 ENCOUNTER — Telehealth (INDEPENDENT_AMBULATORY_CARE_PROVIDER_SITE_OTHER): Payer: BC Managed Care – PPO | Admitting: Physician Assistant

## 2019-05-23 ENCOUNTER — Other Ambulatory Visit: Payer: Self-pay

## 2019-05-23 ENCOUNTER — Encounter (INDEPENDENT_AMBULATORY_CARE_PROVIDER_SITE_OTHER): Payer: Self-pay | Admitting: Physician Assistant

## 2019-05-23 DIAGNOSIS — E7849 Other hyperlipidemia: Secondary | ICD-10-CM

## 2019-05-23 DIAGNOSIS — Z6830 Body mass index (BMI) 30.0-30.9, adult: Secondary | ICD-10-CM

## 2019-05-23 DIAGNOSIS — E119 Type 2 diabetes mellitus without complications: Secondary | ICD-10-CM | POA: Diagnosis not present

## 2019-05-23 DIAGNOSIS — E669 Obesity, unspecified: Secondary | ICD-10-CM | POA: Diagnosis not present

## 2019-05-23 MED ORDER — METFORMIN HCL 1000 MG PO TABS: 1000.0000 mg | ORAL_TABLET | Freq: Two times a day (BID) | ORAL | 1 refills | Status: DC

## 2019-05-23 MED ORDER — JARDIANCE 25 MG PO TABS: 25.0000 mg | ORAL_TABLET | Freq: Every day | ORAL | 1 refills | Status: DC

## 2019-05-23 MED ORDER — ATORVASTATIN CALCIUM 10 MG PO TABS: 10.0000 mg | ORAL_TABLET | Freq: Every day | ORAL | 0 refills | Status: DC

## 2019-05-25 NOTE — Progress Notes (Signed)
Office: [phone]  /  Fax: [phone] TeleHealth Visit:  Carollee Siresammy L Rauda has verbally consented to this TeleHealth visit today. The patient is located at work, the provider is located at the [facility] office. The participants in this visit include the listed provider and patient and any and all parties involved. The visit was conducted today via WebEx.  HPI:   Chief Complaint: OBESITY Jillian Green is here to discuss her progress with her obesity treatment plan. She is on the Category 3 plan and is following her eating plan approximately 80 to 85 % of the time. She states she is exercising 0 minutes 0 times per week. Taela's most recent weight is 158 pounds. She report that she is doing well on the plan and she is not having any struggles with it. We were unable to weigh the patient today for this TeleHealth visit. She feels as if she has lost weight since her last visit (weight 158 lbs 05/23/19). She has lost 19 lbs since starting treatment with us.  Diabetes II Kalasia has a diagnosis of diabetes type II. She is on Metformin and Jardiance. Ayodele states fasting BGs range between 109 and 115 and she denies hypoglycemia or polyphagia. Dima denies UTI. She has been working on intensive lifestyle modifications including diet, exercise, and weight loss to help control her blood glucose levels.  Hyperlipidemia Dezarae has hyperlipidemia and she is on Lipitor. She has been trying to improve her cholesterol levels with intensive lifestyle modification including a low saturated fat diet, exercise and weight loss. She denies chest pain..  ASSESSMENT AND PLAN:  Type 2 diabetes mellitus without complication, without long-term current use of insulin (HCC) - Plan: empagliflozin (JARDIANCE) 25 MG TABS tablet, metFORMIN (GLUCOPHAGE) 1000 MG tablet  Other hyperlipidemia - Plan: atorvastatin (LIPITOR) 10 MG tablet  Class 1 obesity with serious comorbidity and body mass index (BMI) of 30.0 to  30.9 in adult, unspecified obesity type  PLAN:  Diabetes II Sharran has been given extensive diabetes education by myself today including ideal fasting and post-prandial blood glucose readings, individual ideal Hgb A1c goals and hypoglycemia prevention. We discussed the importance of good blood sugar control to decrease the likelihood of diabetic complications such as nephropathy, neuropathy, limb loss, blindness, coronary artery disease, and death. We discussed the importance of intensive lifestyle modification including diet, exercise and weight loss as the first line treatment for diabetes. Marthena agrees to continue metformin 1,000 mg two times daily with a meal #60 with 1 refill and follow up at the agreed upon time.  Hyperlipidemia Lalita was informed of the American Heart Association Guidelines emphasizing intensive lifestyle modifications as the first line treatment for hyperlipidemia. We discussed many lifestyle modifications today in depth, and Efrat will continue to work on decreasing saturated fats such as fatty red meat, butter and many fried foods. She will also increase vegetables and lean protein in her diet and continue to work on exercise and weight loss efforts. Armida agrees to continue Lipitor 10 mg daily #30 with no refills and follow up as directed.  Obesity Domnique is currently in the action stage of change. As such, her goal is to continue with weight loss efforts She has agreed to follow the Category 3 plan Breezie has been instructed to work up to a goal of 150 minutes of combined cardio and strengthening exercise per week for weight loss and overall health benefits. We discussed the following Behavioral Modification Strategies today: keeping healthy foods in the home  and work on meal planning and easy cooking plans  Oyindamola has agreed to follow up with our clinic in 3 weeks. She was informed of the importance of frequent follow up visits to maximize her success with intensive  lifestyle modifications for her multiple health conditions.  ALLERGIES: Allergies  Allergen Reactions   Augmentin [Amoxicillin-Pot Clavulanate]     Severe yeast infections    MEDICATIONS: Current Outpatient Medications on File Prior to Visit  Medication Sig Dispense Refill   ALPRAZolam (XANAX) 0.5 MG tablet Take 1 tablet (0.5 mg total) by mouth 3 (three) times daily as needed for anxiety. 45 tablet 1   azelastine (OPTIVAR) 0.05 % ophthalmic solution Place 1 drop into both eyes 2 (two) times daily as needed. 18 mL 1   BD PEN NEEDLE NANO U/F 32G X 4 MM MISC USE TWICE A DAY 100 each 0   buPROPion (WELLBUTRIN XL) 150 MG 24 hr tablet TAKE 1 TABLET BY MOUTH EVERY DAY 90 tablet 1   DULoxetine (CYMBALTA) 60 MG capsule Take 1 capsule (60 mg total) by mouth 2 (two) times daily. 180 capsule 1   glucose blood test strip Use as instructed 100 each 0   hydrOXYzine (ATARAX/VISTARIL) 25 MG tablet TAKE 1 TO 2 TABLETS BY MOUTH AT BEDTIME AS NEEDED FOR SLEEP 30 tablet 0   levocetirizine (XYZAL) 5 MG tablet TAKE 1 TABLET BY MOUTH EVERY DAY IN THE EVENING 90 tablet 1   lisdexamfetamine (VYVANSE) 50 MG capsule Take 1 capsule (50 mg total) by mouth daily. 30 capsule 0   lisdexamfetamine (VYVANSE) 50 MG capsule Take 1 capsule (50 mg total) by mouth daily. 30 capsule 0   lisdexamfetamine (VYVANSE) 50 MG capsule Take 1 capsule (50 mg total) by mouth daily. 30 capsule 0   metroNIDAZOLE (METROGEL) 1 % gel Apply topically daily. 45 g 0   NONFORMULARY OR COMPOUNDED ITEM Tiger balm patches 1 patch daily prn 30 each 5   omeprazole (PRILOSEC) 20 MG capsule Take 1 capsule (20 mg total) by mouth daily. 90 capsule 0   OneTouch Delica Lancets 30G MISC 1 Device by Does not apply route daily. 100 each 0   rizatriptan (MAXALT-MLT) 10 MG disintegrating tablet Take 1 tablet (10 mg total) by mouth as needed for migraine. May repeat in 2 hours if needed 10 tablet 0   Sulfacetamide Sodium, Acne, 10 % LOTN Use small  amount qhs 118 mL 3   TRI-LO-MARZIA 0.18/0.215/0.25 MG-25 MCG tab Take 1 tablet by mouth daily.  3   Vitamin D, Ergocalciferol, (DRISDOL) 1.25 MG (50000 UT) CAPS capsule Take 1 capsule (50,000 Units total) by mouth every 14 (fourteen) days. 2 capsule 0   No current facility-administered medications on file prior to visit.     PAST MEDICAL HISTORY: Past Medical History:  Diagnosis Date   ADD (attention deficit disorder)    Anxiety    Back pain    Chronic headaches    Depression    Fibromyalgia    GERD (gastroesophageal reflux disease)    Itchy skin    Joint pain    Lactose intolerance    Migraine    Sweating increase    Type 2 diabetes mellitus without complication, without long-term current use of insulin (HCC) 09/13/2018   Unspecified eustachian tube disorder     PAST SURGICAL HISTORY: Past Surgical History:  Procedure Laterality Date   CESAREAN SECTION     OTHER SURGICAL HISTORY     preforated uterine surgery   SINUS EXPLORATION  SOCIAL HISTORY: Social History   Tobacco Use   Smoking status: Former Smoker    Years: 8.00    Quit date: 05/15/2006    Years since quitting: 13.0   Smokeless tobacco: Never Used   Tobacco comment: 1 cig every 4 months  Substance Use Topics   Alcohol use: Yes    Alcohol/week: 1.0 standard drinks    Types: 1 Glasses of wine per week   Drug use: No    FAMILY HISTORY: Family History  Problem Relation Age of Onset   Other Father        Dyslexia   Heart disease Father    Hypertension Father    Alcoholism Father    Hypertension Mother    Thyroid disease Mother    Anxiety disorder Mother    Sleep apnea Mother    Obesity Mother    ADD / ADHD Sister     ROS: Review of Systems  Constitutional: Positive for weight loss.  Cardiovascular: Negative for chest pain.  Genitourinary:       Negative for UTI  Endo/Heme/Allergies:       Negative for hypoglycemia Negative for polyphagia      PHYSICAL EXAM: Pt in no acute distress  RECENT LABS AND TESTS: BMET    Component Value Date/Time   NA 141 03/11/2019 1119   K 4.2 03/11/2019 1119   CL 102 03/11/2019 1119   CO2 22 03/11/2019 1119   GLUCOSE 115 (H) 03/11/2019 1119   GLUCOSE 187 (H) 10/23/2016 1348   BUN 11 03/11/2019 1119   CREATININE 0.77 03/11/2019 1119   CALCIUM 9.1 03/11/2019 1119   GFRNONAA 93 03/11/2019 1119   GFRAA 107 03/11/2019 1119   Lab Results  Component Value Date   HGBA1C 5.8 (H) 03/11/2019   HGBA1C 7.1 (H) 09/13/2018   HGBA1C 7.7 (H) 04/29/2018   Lab Results  Component Value Date   INSULIN 7.6 03/11/2019   INSULIN 15.9 09/13/2018   INSULIN 18.4 04/29/2018   CBC    Component Value Date/Time   WBC 6.8 04/29/2018 1311   WBC 8.0 10/23/2016 1348   RBC 4.44 04/29/2018 1311   RBC 4.31 10/23/2016 1348   HGB 13.7 04/29/2018 1311   HCT 42.3 04/29/2018 1311   PLT 305 10/23/2016 1348   MCV 95 04/29/2018 1311   MCH 30.9 04/29/2018 1311   MCH 31.8 10/23/2016 1348   MCHC 32.4 04/29/2018 1311   MCHC 33.9 10/23/2016 1348   RDW 12.8 04/29/2018 1311   LYMPHSABS 2.2 04/29/2018 1311   MONOABS 0.3 09/23/2013 0950   EOSABS 0.2 04/29/2018 1311   BASOSABS 0.0 04/29/2018 1311   Iron/TIBC/Ferritin/ %Sat    Component Value Date/Time   IRON 156 (H) 09/23/2013 0950   FERRITIN 103.5 09/23/2013 0950   IRONPCTSAT 38.0 09/23/2013 0950   Lipid Panel     Component Value Date/Time   CHOL 133 03/11/2019 1119   TRIG 67 03/11/2019 1119   HDL 52 03/11/2019 1119   LDLCALC 68 03/11/2019 1119   Hepatic Function Panel     Component Value Date/Time   PROT 6.7 03/11/2019 1119   ALBUMIN 4.8 03/11/2019 1119   AST 17 03/11/2019 1119   ALT 33 (H) 03/11/2019 1119   ALKPHOS 102 03/11/2019 1119   BILITOT 0.4 03/11/2019 1119   BILIDIR 0.0 09/23/2013 0950      Component Value Date/Time   TSH 0.770 04/29/2018 1311   TSH 1.63 05/15/2016 1557   TSH 0.99 09/23/2013 0950     Ref.  Range 03/11/2019 11:19   Vitamin D, 25-Hydroxy Latest Ref Range: 30.0 - 100.0 ng/mL 41.2    I, Doreene Nest, am acting as Location manager for Abby Potash, PA-C I, Abby Potash, PA-C have reviewed above note and agree with its content

## 2019-05-26 ENCOUNTER — Encounter (INDEPENDENT_AMBULATORY_CARE_PROVIDER_SITE_OTHER): Payer: Self-pay | Admitting: Physician Assistant

## 2019-05-26 NOTE — Telephone Encounter (Signed)
Please advise 

## 2019-06-01 ENCOUNTER — Other Ambulatory Visit (INDEPENDENT_AMBULATORY_CARE_PROVIDER_SITE_OTHER): Payer: Self-pay | Admitting: Physician Assistant

## 2019-06-01 DIAGNOSIS — E119 Type 2 diabetes mellitus without complications: Secondary | ICD-10-CM

## 2019-06-03 ENCOUNTER — Other Ambulatory Visit (INDEPENDENT_AMBULATORY_CARE_PROVIDER_SITE_OTHER): Payer: Self-pay | Admitting: Physician Assistant

## 2019-06-03 DIAGNOSIS — E119 Type 2 diabetes mellitus without complications: Secondary | ICD-10-CM

## 2019-06-13 ENCOUNTER — Encounter: Payer: Self-pay | Admitting: Family Medicine

## 2019-06-13 ENCOUNTER — Other Ambulatory Visit: Payer: Self-pay

## 2019-06-13 ENCOUNTER — Encounter (INDEPENDENT_AMBULATORY_CARE_PROVIDER_SITE_OTHER): Payer: Self-pay | Admitting: Physician Assistant

## 2019-06-13 ENCOUNTER — Telehealth (INDEPENDENT_AMBULATORY_CARE_PROVIDER_SITE_OTHER): Payer: BC Managed Care – PPO | Admitting: Physician Assistant

## 2019-06-13 DIAGNOSIS — E559 Vitamin D deficiency, unspecified: Secondary | ICD-10-CM | POA: Diagnosis not present

## 2019-06-13 DIAGNOSIS — E119 Type 2 diabetes mellitus without complications: Secondary | ICD-10-CM

## 2019-06-13 DIAGNOSIS — Z6830 Body mass index (BMI) 30.0-30.9, adult: Secondary | ICD-10-CM | POA: Diagnosis not present

## 2019-06-13 DIAGNOSIS — E669 Obesity, unspecified: Secondary | ICD-10-CM

## 2019-06-14 NOTE — Progress Notes (Signed)
Office: [phone]  /  Fax: [phone] TeleHealth Visit:  [name] has verbally consented to this TeleHealth visit today. The patient is located at work, the provider is located at the [facility] office. The participants in this visit include the listed provider and patient and any and all parties involved. The visit was conducted today via WebEx.  HPI:   Chief Complaint: OBESITY Maie is here to discuss her progress with her obesity treatment plan. She is on the Category 3 plan and is following her eating plan approximately 70 % of the time. She states she is exercising 0 minutes 0 times per week. Zena's most recent weight is 158 pounds (06/12/18). She has done a good job staying on plan with the exception of a couple of celebratory drinks. She wants to start working out more.  We were unable to weigh the patient today for this TeleHealth visit. She feels as if she has maintained weight since her last visit. She has lost 19 lbs since starting treatment with [place].  Diabetes II Laconya has a diagnosis of diabetes type II. She is on Jardiance and Metformin. Lakara states fasting BGs range between 110 and 120 and she denies any hypoglycemic episodes. Last A1c was at 5.8 She has been working on intensive lifestyle modifications including diet, exercise, and weight loss to help control her blood glucose levels.  Vitamin D deficiency Analise has a diagnosis of vitamin D deficiency. Tanveer is currently taking vit D and she denies nausea, vomiting or muscle weakness.  ASSESSMENT AND PLAN:  Vitamin D deficiency  Type 2 diabetes mellitus without complication, without long-term current use of insulin (HCC)  Class 1 obesity with serious comorbidity and body mass index (BMI) of 30.0 to 30.9 in adult, unspecified obesity type  PLAN:  Diabetes II Lucciana has been given extensive diabetes education by myself today including ideal fasting and post-prandial blood glucose readings,  individual ideal Hgb A1c goals and hypoglycemia prevention. We discussed the importance of good blood sugar control to decrease the likelihood of diabetic complications such as nephropathy, neuropathy, limb loss, blindness, coronary artery disease, and death. We discussed the importance of intensive lifestyle modification including diet, exercise and weight loss as the first line treatment for diabetes. Sriya agrees to continue with medications and weight loss and she will follow up at the agreed upon time.  Vitamin D Deficiency Lina was informed that low vitamin D levels contributes to fatigue and are associated with obesity, breast, and colon cancer. Poetry will continue to take prescription Vit D @50 ,000 IU every other week and she will follow up for routine testing of vitamin D, at least 2-3 times per year. She was informed of the risk of over-replacement of vitamin D and agrees to not increase her dose unless she discusses this with first. We will check labs today and Avonell will follow up as directed.  Obesity Pamlea is currently in the action stage of change. As such, her goal is to continue with weight loss efforts She has agreed to follow the Category 3 plan Ionna has been instructed to work up to a goal of 150 minutes of combined cardio and strengthening exercise per week for weight loss and overall health benefits. We discussed the following Behavioral Modification Strategies today: keeping healthy foods in the home and work on meal planning and easy cooking plans  Tess has agreed to follow up with our clinic in 3 weeks. She was informed of the importance of  frequent follow up visits to maximize her success with intensive lifestyle modifications for her multiple health conditions.  ALLERGIES: Allergies  Allergen Reactions   Augmentin [Amoxicillin-Pot Clavulanate]     Severe yeast infections    MEDICATIONS: Current Outpatient Medications on File Prior to Visit  Medication Sig  Dispense Refill   ALPRAZolam (XANAX) 0.5 MG tablet Take 1 tablet (0.5 mg total) by mouth 3 (three) times daily as needed for anxiety. 45 tablet 1   atorvastatin (LIPITOR) 10 MG tablet Take 1 tablet (10 mg total) by mouth daily. 30 tablet 0   azelastine (OPTIVAR) 0.05 % ophthalmic solution Place 1 drop into both eyes 2 (two) times daily as needed. 18 mL 1   BD PEN NEEDLE NANO U/F 32G X 4 MM MISC USE TWICE A DAY 100 each 0   buPROPion (WELLBUTRIN XL) 150 MG 24 hr tablet TAKE 1 TABLET BY MOUTH EVERY DAY 90 tablet 1   DULoxetine (CYMBALTA) 60 MG capsule Take 1 capsule (60 mg total) by mouth 2 (two) times daily. 180 capsule 1   empagliflozin (JARDIANCE) 25 MG TABS tablet Take 25 mg by mouth daily. 30 tablet 1   hydrOXYzine (ATARAX/VISTARIL) 25 MG tablet TAKE 1 TO 2 TABLETS BY MOUTH AT BEDTIME AS NEEDED FOR SLEEP 30 tablet 0   levocetirizine (XYZAL) 5 MG tablet TAKE 1 TABLET BY MOUTH EVERY DAY IN THE EVENING 90 tablet 1   lisdexamfetamine (VYVANSE) 50 MG capsule Take 1 capsule (50 mg total) by mouth daily. 30 capsule 0   lisdexamfetamine (VYVANSE) 50 MG capsule Take 1 capsule (50 mg total) by mouth daily. 30 capsule 0   lisdexamfetamine (VYVANSE) 50 MG capsule Take 1 capsule (50 mg total) by mouth daily. 30 capsule 0   metFORMIN (GLUCOPHAGE) 1000 MG tablet Take 1 tablet (1,000 mg total) by mouth 2 (two) times daily with a meal. 60 tablet 1   metroNIDAZOLE (METROGEL) 1 % gel Apply topically daily. 45 g 0   NONFORMULARY OR COMPOUNDED ITEM Tiger balm patches 1 patch daily prn 30 each 5   omeprazole (PRILOSEC) 20 MG capsule Take 1 capsule (20 mg total) by mouth daily. 90 capsule 0   OneTouch Delica Lancets 30G MISC 1 Device by Does not apply route daily. 100 each 0   ONETOUCH ULTRA test strip USE AS INSTRUCTED 100 strip 0   rizatriptan (MAXALT-MLT) 10 MG disintegrating tablet Take 1 tablet (10 mg total) by mouth as needed for migraine. May repeat in 2 hours if needed 10 tablet 0    Sulfacetamide Sodium, Acne, 10 % LOTN Use small amount qhs 118 mL 3   TRI-LO-MARZIA 0.18/0.215/0.25 MG-25 MCG tab Take 1 tablet by mouth daily.  3   Vitamin D, Ergocalciferol, (DRISDOL) 1.25 MG (50000 UT) CAPS capsule Take 1 capsule (50,000 Units total) by mouth every 14 (fourteen) days. 2 capsule 0   No current facility-administered medications on file prior to visit.     PAST MEDICAL HISTORY: Past Medical History:  Diagnosis Date   ADD (attention deficit disorder)    Anxiety    Back pain    Chronic headaches    Depression    Fibromyalgia    GERD (gastroesophageal reflux disease)    Itchy skin    Joint pain    Lactose intolerance    Migraine    Sweating increase    Type 2 diabetes mellitus without complication, without long-term current use of insulin (HCC) 09/13/2018   Unspecified eustachian tube disorder  PAST SURGICAL HISTORY: Past Surgical History:  Procedure Laterality Date   CESAREAN SECTION     OTHER SURGICAL HISTORY     preforated uterine surgery   SINUS EXPLORATION      SOCIAL HISTORY: Social History   Tobacco Use   Smoking status: Former Smoker    Years: 8.00    Quit date: 05/15/2006    Years since quitting: 13.0   Smokeless tobacco: Never Used   Tobacco comment: 1 cig every 4 months  Substance Use Topics   Alcohol use: Yes    Alcohol/week: 1.0 standard drinks    Types: 1 Glasses of wine per week   Drug use: No    FAMILY HISTORY: Family History  Problem Relation Age of Onset   Other Father        Dyslexia   Heart disease Father    Hypertension Father    Alcoholism Father    Hypertension Mother    Thyroid disease Mother    Anxiety disorder Mother    Sleep apnea Mother    Obesity Mother    ADD / ADHD Sister     ROS: Review of Systems  Constitutional: Negative for weight loss.  Gastrointestinal: Negative for nausea and vomiting.  Musculoskeletal:       Negative for muscle weakness    Endo/Heme/Allergies:       Negative for hypoglycemia    PHYSICAL EXAM: Pt in no acute distress  RECENT LABS AND TESTS: BMET    Component Value Date/Time   NA 141 03/11/2019 1119   K 4.2 03/11/2019 1119   CL 102 03/11/2019 1119   CO2 22 03/11/2019 1119   GLUCOSE 115 (H) 03/11/2019 1119   GLUCOSE 187 (H) 10/23/2016 1348   BUN 11 03/11/2019 1119   CREATININE 0.77 03/11/2019 1119   CALCIUM 9.1 03/11/2019 1119   GFRNONAA 93 03/11/2019 1119   GFRAA 107 03/11/2019 1119   Lab Results  Component Value Date   HGBA1C 5.8 (H) 03/11/2019   HGBA1C 7.1 (H) 09/13/2018   HGBA1C 7.7 (H) 04/29/2018   Lab Results  Component Value Date   INSULIN 7.6 03/11/2019   INSULIN 15.9 09/13/2018   INSULIN 18.4 04/29/2018   CBC    Component Value Date/Time   WBC 6.8 04/29/2018 1311   WBC 8.0 10/23/2016 1348   RBC 4.44 04/29/2018 1311   RBC 4.31 10/23/2016 1348   HGB 13.7 04/29/2018 1311   HCT 42.3 04/29/2018 1311   PLT 305 10/23/2016 1348   MCV 95 04/29/2018 1311   MCH 30.9 04/29/2018 1311   MCH 31.8 10/23/2016 1348   MCHC 32.4 04/29/2018 1311   MCHC 33.9 10/23/2016 1348   RDW 12.8 04/29/2018 1311   LYMPHSABS 2.2 04/29/2018 1311   MONOABS 0.3 09/23/2013 0950   EOSABS 0.2 04/29/2018 1311   BASOSABS 0.0 04/29/2018 1311   Iron/TIBC/Ferritin/ %Sat    Component Value Date/Time   IRON 156 (H) 09/23/2013 0950   FERRITIN 103.5 09/23/2013 0950   IRONPCTSAT 38.0 09/23/2013 0950   Lipid Panel     Component Value Date/Time   CHOL 133 03/11/2019 1119   TRIG 67 03/11/2019 1119   HDL 52 03/11/2019 1119   LDLCALC 68 03/11/2019 1119   Hepatic Function Panel     Component Value Date/Time   PROT 6.7 03/11/2019 1119   ALBUMIN 4.8 03/11/2019 1119   AST 17 03/11/2019 1119   ALT 33 (H) 03/11/2019 1119   ALKPHOS 102 03/11/2019 1119   BILITOT 0.4 03/11/2019 1119  BILIDIR 0.0 09/23/2013 0950      Component Value Date/Time   TSH 0.770 04/29/2018 1311   TSH 1.63 05/15/2016 1557   TSH  0.99 09/23/2013 0950     Ref. Range 03/11/2019 11:19  Vitamin D, 25-Hydroxy Latest Ref Range: 30.0 - 100.0 ng/mL 41.2    I, [place] Crane, am acting as Energy manager for Alois Cliche, PA-C I, Alois Cliche, PA-C have reviewed above note and agree with its content

## 2019-06-18 ENCOUNTER — Other Ambulatory Visit (INDEPENDENT_AMBULATORY_CARE_PROVIDER_SITE_OTHER): Payer: Self-pay | Admitting: Physician Assistant

## 2019-06-18 DIAGNOSIS — E7849 Other hyperlipidemia: Secondary | ICD-10-CM

## 2019-06-22 ENCOUNTER — Other Ambulatory Visit (INDEPENDENT_AMBULATORY_CARE_PROVIDER_SITE_OTHER): Payer: Self-pay | Admitting: Physician Assistant

## 2019-06-22 DIAGNOSIS — E7849 Other hyperlipidemia: Secondary | ICD-10-CM

## 2019-06-28 ENCOUNTER — Encounter (INDEPENDENT_AMBULATORY_CARE_PROVIDER_SITE_OTHER): Payer: Self-pay

## 2019-06-28 ENCOUNTER — Other Ambulatory Visit (INDEPENDENT_AMBULATORY_CARE_PROVIDER_SITE_OTHER): Payer: Self-pay

## 2019-06-28 ENCOUNTER — Encounter (INDEPENDENT_AMBULATORY_CARE_PROVIDER_SITE_OTHER): Payer: Self-pay | Admitting: Physician Assistant

## 2019-06-28 DIAGNOSIS — E7849 Other hyperlipidemia: Secondary | ICD-10-CM

## 2019-06-28 MED ORDER — ATORVASTATIN CALCIUM 10 MG PO TABS: 10.0000 mg | ORAL_TABLET | Freq: Every day | ORAL | 0 refills | Status: DC

## 2019-07-04 ENCOUNTER — Telehealth (INDEPENDENT_AMBULATORY_CARE_PROVIDER_SITE_OTHER): Payer: BC Managed Care – PPO | Admitting: Physician Assistant

## 2019-07-14 ENCOUNTER — Telehealth (INDEPENDENT_AMBULATORY_CARE_PROVIDER_SITE_OTHER): Payer: BC Managed Care – PPO | Admitting: Physician Assistant

## 2019-07-14 ENCOUNTER — Encounter (INDEPENDENT_AMBULATORY_CARE_PROVIDER_SITE_OTHER): Payer: Self-pay | Admitting: Physician Assistant

## 2019-07-14 ENCOUNTER — Other Ambulatory Visit: Payer: Self-pay

## 2019-07-14 DIAGNOSIS — E119 Type 2 diabetes mellitus without complications: Secondary | ICD-10-CM | POA: Diagnosis not present

## 2019-07-14 DIAGNOSIS — E669 Obesity, unspecified: Secondary | ICD-10-CM | POA: Diagnosis not present

## 2019-07-14 DIAGNOSIS — E559 Vitamin D deficiency, unspecified: Secondary | ICD-10-CM | POA: Diagnosis not present

## 2019-07-14 DIAGNOSIS — Z6830 Body mass index (BMI) 30.0-30.9, adult: Secondary | ICD-10-CM

## 2019-07-14 MED ORDER — VITAMIN D (ERGOCALCIFEROL) 1.25 MG (50000 UNIT) PO CAPS: 50000.0000 [IU] | ORAL_CAPSULE | ORAL | 0 refills | Status: DC

## 2019-07-18 ENCOUNTER — Telehealth: Payer: Self-pay

## 2019-07-18 ENCOUNTER — Encounter: Payer: Self-pay | Admitting: Family Medicine

## 2019-07-18 NOTE — Progress Notes (Signed)
Office: [phone]  /  Fax: [phone] TeleHealth Visit:  Jillian Green has verbally consented to this TeleHealth visit today. The patient is located at work, the provider is located at the [employer] [facility] office. The participants in this visit include the listed provider and patient and any and all parties involved. The visit was conducted today via WebEx.  HPI:  Chief Complaint: OBESITY [name] is here to discuss her progress with her obesity treatment plan. She is on the Category 3 plan and states she is following her eating plan approximately 80 % of the time. She states she is exercising 0 minutes 0 times per week.  [name]'s most recent weight is at 160 pounds (07/14/19). She reports that she "did terrible" for Thanksgiving and the week after. She has been back on the plan for the last week.  Vitamin D deficiency [name] has a diagnosis of vitamin D deficiency. She has been taking vit D 2,000 units OTC for the last week, because she ran out of her prescription. She is feeling fatigued.   Diabetes II [name] has a diagnosis of diabetes type II. She hasn't checked her fasting blood sugars in two weeks. She has no hypoglycemia. [name] is on Jardiance and Metformin. She has no nausea, vomiting, diarrhea or UTI's  ASSESSMENT AND PLAN:  Vitamin D deficiency - Plan: Vitamin D, Ergocalciferol, (DRISDOL) 1.25 MG (50000 UT) CAPS capsule  Type 2 diabetes mellitus without complication, without long-term current use of insulin (HCC)  Class 1 obesity with serious comorbidity and body mass index (BMI) of 30.0 to 30.9 in adult, unspecified obesity type - Starting BMI greater then 30  PLAN:  Vitamin D Deficiency Low vitamin D level contributes to fatigue and are associated with obesity, breast, and colon cancer. Disney agrees to continue to take prescription Vit D @50 ,000 IU every 14 days #2 with no refills and she will follow up for routine testing of vitamin D, at least 2-3 times per  year to avoid over-replacement. Alessandra agrees to follow up with our clinic in 3 weeks.  Diabetes II [name] has been given diabetes education by myself today. Good blood sugar control is important to decrease the likelihood of diabetic complications such as nephropathy, neuropathy, limb loss, blindness, coronary artery disease, and death. Intensive lifestyle modification including diet, exercise and weight loss were discussed as the first line treatment for diabetes. [name] will continue medications and weight loss.  Obesity [name] is currently in the action stage of change. As such, her goal is to continue with weight loss efforts She has agreed to follow the Category 3 plan [name] has been instructed to work up to a goal of 150 minutes of combined cardio and strengthening exercise per week for weight loss and overall health benefits. We discussed the following Behavioral Modification Strategies today: planning for success and keeping healthy foods in the home  [name] has agreed to follow up with our clinic in 3 weeks. She was informed of the importance of frequent follow up visits to maximize her success with intensive lifestyle modifications for her multiple health conditions.  ALLERGIES: Allergies  Allergen Reactions  . Augmentin [Amoxicillin-Pot Clavulanate]     Severe yeast infections    MEDICATIONS: Current Outpatient Medications on File Prior to Visit  Medication Sig Dispense Refill  . ALPRAZolam (XANAX) 0.5 MG tablet Take 1 tablet (0.5 mg total) by mouth 3 (three) times daily as needed for anxiety. 45 tablet 1  . atorvastatin (LIPITOR) 10 MG tablet Take  1 tablet (10 mg total) by mouth daily. 30 tablet 0  . azelastine (OPTIVAR) 0.05 % ophthalmic solution Place 1 drop into both eyes 2 (two) times daily as needed. 18 mL 1  . BD PEN NEEDLE NANO U/F 32G X 4 MM MISC USE TWICE A DAY 100 each 0  . buPROPion (WELLBUTRIN XL) 150 MG 24 hr tablet TAKE 1 TABLET BY MOUTH EVERY DAY 90 tablet 1  .  DULoxetine (CYMBALTA) 60 MG capsule Take 1 capsule (60 mg total) by mouth 2 (two) times daily. 180 capsule 1  . empagliflozin (JARDIANCE) 25 MG TABS tablet Take 25 mg by mouth daily. 30 tablet 1  . hydrOXYzine (ATARAX/VISTARIL) 25 MG tablet TAKE 1 TO 2 TABLETS BY MOUTH AT BEDTIME AS NEEDED FOR SLEEP 30 tablet 0  . levocetirizine (XYZAL) 5 MG tablet TAKE 1 TABLET BY MOUTH EVERY DAY IN THE EVENING 90 tablet 1  . lisdexamfetamine (VYVANSE) 50 MG capsule Take 1 capsule (50 mg total) by mouth daily. 30 capsule 0  . lisdexamfetamine (VYVANSE) 50 MG capsule Take 1 capsule (50 mg total) by mouth daily. 30 capsule 0  . lisdexamfetamine (VYVANSE) 50 MG capsule Take 1 capsule (50 mg total) by mouth daily. 30 capsule 0  . metFORMIN (GLUCOPHAGE) 1000 MG tablet Take 1 tablet (1,000 mg total) by mouth 2 (two) times daily with a meal. 60 tablet 1  . metroNIDAZOLE (METROGEL) 1 % gel Apply topically daily. 45 g 0  . NONFORMULARY OR COMPOUNDED ITEM Tiger balm patches 1 patch daily prn 30 each 5  . omeprazole (PRILOSEC) 20 MG capsule Take 1 capsule (20 mg total) by mouth daily. 90 capsule 0  . OneTouch Delica Lancets 30G MISC 1 Device by Does not apply route daily. 100 each 0  . ONETOUCH ULTRA test strip USE AS INSTRUCTED 100 strip 0  . rizatriptan (MAXALT-MLT) 10 MG disintegrating tablet Take 1 tablet (10 mg total) by mouth as needed for migraine. May repeat in 2 hours if needed 10 tablet 0  . Sulfacetamide Sodium, Acne, 10 % LOTN Use small amount qhs 118 mL 3  . TRI-LO-MARZIA 0.18/0.215/0.25 MG-25 MCG tab Take 1 tablet by mouth daily.  3   No current facility-administered medications on file prior to visit.    PAST MEDICAL HISTORY: Past Medical History:  Diagnosis Date  . ADD (attention deficit disorder)   . Anxiety   . Back pain   . Chronic headaches   . Depression   . Fibromyalgia   . GERD (gastroesophageal reflux disease)   . Itchy skin   . Joint pain   . Lactose intolerance   . Migraine   .  Sweating increase   . Type 2 diabetes mellitus without complication, without long-term current use of insulin (HCC) 09/13/2018  . Unspecified eustachian tube disorder     PAST SURGICAL HISTORY: Past Surgical History:  Procedure Laterality Date  . CESAREAN SECTION    . OTHER SURGICAL HISTORY     preforated uterine surgery  . SINUS EXPLORATION      SOCIAL HISTORY: Social History   Tobacco Use  . Smoking status: Former Smoker    Years: 8.00    Quit date: 05/15/2006    Years since quitting: 13.1  . Smokeless tobacco: Never Used  . Tobacco comment: 1 cig every 4 months  Substance Use Topics  . Alcohol use: Yes    Alcohol/week: 1.0 standard drinks    Types: 1 Glasses of wine per week  . Drug use:  No    FAMILY HISTORY: Family History  Problem Relation Age of Onset  . Other Father        Dyslexia  . Heart disease Father   . Hypertension Father   . Alcoholism Father   . Hypertension Mother   . Thyroid disease Mother   . Anxiety disorder Mother   . Sleep apnea Mother   . Obesity Mother   . ADD / ADHD Sister     ROS: Review of Systems  Constitutional: Positive for malaise/fatigue and weight loss.  Gastrointestinal: Negative for diarrhea, nausea and vomiting.  Genitourinary:       Negative for UTI's  Endo/Heme/Allergies:       Negative for hypoglycemia    PHYSICAL EXAM: There were no vitals taken for this visit. There is no height or weight on file to calculate BMI. Physical Exam Vitals reviewed.  Constitutional:      General: She is not in acute distress.    Appearance: Normal appearance. She is well-developed. She is obese.  Cardiovascular:     Rate and Rhythm: Normal rate.  Pulmonary:     Effort: Pulmonary effort is normal.  Musculoskeletal:        General: Normal range of motion.  Skin:    General: Skin is warm and dry.  Neurological:     Mental Status: She is alert and oriented to person, place, and time.  Psychiatric:        Mood and Affect: Mood  normal.        Behavior: Behavior normal.     RECENT LABS AND TESTS: BMET    Component Value Date/Time   NA 141 03/11/2019 1119   K 4.2 03/11/2019 1119   CL 102 03/11/2019 1119   CO2 22 03/11/2019 1119   GLUCOSE 115 (H) 03/11/2019 1119   GLUCOSE 187 (H) 10/23/2016 1348   BUN 11 03/11/2019 1119   CREATININE 0.77 03/11/2019 1119   CALCIUM 9.1 03/11/2019 1119   GFRNONAA 93 03/11/2019 1119   GFRAA 107 03/11/2019 1119   Lab Results  Component Value Date   HGBA1C 5.8 (H) 03/11/2019   HGBA1C 7.1 (H) 09/13/2018   HGBA1C 7.7 (H) 04/29/2018   Lab Results  Component Value Date   INSULIN 7.6 03/11/2019   INSULIN 15.9 09/13/2018   INSULIN 18.4 04/29/2018   CBC    Component Value Date/Time   WBC 6.8 04/29/2018 1311   WBC 8.0 10/23/2016 1348   RBC 4.44 04/29/2018 1311   RBC 4.31 10/23/2016 1348   HGB 13.7 04/29/2018 1311   HCT 42.3 04/29/2018 1311   PLT 305 10/23/2016 1348   MCV 95 04/29/2018 1311   MCH 30.9 04/29/2018 1311   MCH 31.8 10/23/2016 1348   MCHC 32.4 04/29/2018 1311   MCHC 33.9 10/23/2016 1348   RDW 12.8 04/29/2018 1311   LYMPHSABS 2.2 04/29/2018 1311   MONOABS 0.3 09/23/2013 0950   EOSABS 0.2 04/29/2018 1311   BASOSABS 0.0 04/29/2018 1311   Iron/TIBC/Ferritin/ %Sat    Component Value Date/Time   IRON 156 (H) 09/23/2013 0950   FERRITIN 103.5 09/23/2013 0950   IRONPCTSAT 38.0 09/23/2013 0950   Lipid Panel     Component Value Date/Time   CHOL 133 03/11/2019 1119   TRIG 67 03/11/2019 1119   HDL 52 03/11/2019 1119   LDLCALC 68 03/11/2019 1119   Hepatic Function Panel     Component Value Date/Time   PROT 6.7 03/11/2019 1119   ALBUMIN 4.8 03/11/2019 1119   AST  17 03/11/2019 1119   ALT 33 (H) 03/11/2019 1119   ALKPHOS 102 03/11/2019 1119   BILITOT 0.4 03/11/2019 1119   BILIDIR 0.0 09/23/2013 0950      Component Value Date/Time   TSH 0.770 04/29/2018 1311   TSH 1.63 05/15/2016 1557   TSH 0.99 09/23/2013 0950     Ref. Range 03/11/2019 11:19    Vitamin D, 25-Hydroxy Latest Ref Range: 30.0 - 100.0 ng/mL 41.2    I, [place] Crane, am acting as Energy manager for Alois Cliche, PA-C I, Alois Cliche, PA-C have reviewed above note and agree with its content

## 2019-07-18 NOTE — Telephone Encounter (Signed)
PA approved. Effective 07/18/2019 to 07/17/2022.

## 2019-07-18 NOTE — Telephone Encounter (Signed)
PA initiated via Covermymeds; KEY: [id number]. Awaiting determination.

## 2019-07-19 ENCOUNTER — Other Ambulatory Visit: Payer: Self-pay | Admitting: Family Medicine

## 2019-07-23 ENCOUNTER — Other Ambulatory Visit (INDEPENDENT_AMBULATORY_CARE_PROVIDER_SITE_OTHER): Payer: Self-pay | Admitting: Physician Assistant

## 2019-07-23 ENCOUNTER — Other Ambulatory Visit: Payer: Self-pay | Admitting: Family Medicine

## 2019-07-23 DIAGNOSIS — J302 Other seasonal allergic rhinitis: Secondary | ICD-10-CM

## 2019-07-23 DIAGNOSIS — E7849 Other hyperlipidemia: Secondary | ICD-10-CM

## 2019-08-02 ENCOUNTER — Other Ambulatory Visit (INDEPENDENT_AMBULATORY_CARE_PROVIDER_SITE_OTHER): Payer: Self-pay | Admitting: Physician Assistant

## 2019-08-02 DIAGNOSIS — E119 Type 2 diabetes mellitus without complications: Secondary | ICD-10-CM

## 2019-08-03 ENCOUNTER — Encounter: Payer: Self-pay | Admitting: Family Medicine

## 2019-08-03 ENCOUNTER — Ambulatory Visit (INDEPENDENT_AMBULATORY_CARE_PROVIDER_SITE_OTHER): Payer: BC Managed Care – PPO | Admitting: Family Medicine

## 2019-08-03 ENCOUNTER — Other Ambulatory Visit: Payer: Self-pay

## 2019-08-03 VITALS — Wt 161.0 lb

## 2019-08-03 DIAGNOSIS — F988 Other specified behavioral and emotional disorders with onset usually occurring in childhood and adolescence: Secondary | ICD-10-CM | POA: Diagnosis not present

## 2019-08-03 DIAGNOSIS — F909 Attention-deficit hyperactivity disorder, unspecified type: Secondary | ICD-10-CM

## 2019-08-03 DIAGNOSIS — M797 Fibromyalgia: Secondary | ICD-10-CM

## 2019-08-03 DIAGNOSIS — F418 Other specified anxiety disorders: Secondary | ICD-10-CM

## 2019-08-03 MED ORDER — DULOXETINE HCL 60 MG PO CPEP: 60.0000 mg | ORAL_CAPSULE | Freq: Two times a day (BID) | ORAL | 1 refills | Status: DC

## 2019-08-03 MED ORDER — LISDEXAMFETAMINE DIMESYLATE 50 MG PO CAPS: 50.0000 mg | ORAL_CAPSULE | Freq: Every day | ORAL | 0 refills | Status: DC

## 2019-08-03 MED ORDER — BUPROPION HCL ER (XL) 150 MG PO TB24: 150.0000 mg | ORAL_TABLET | Freq: Every day | ORAL | 1 refills | Status: DC

## 2019-08-03 NOTE — Telephone Encounter (Addendum)
Pt has virtual visit with PCP today.

## 2019-08-03 NOTE — Progress Notes (Signed)
Virtual Visit via Video Note  I connected with Jillian Green on 08/03/19 at  9:40 AM EST by a video enabled telemedicine application and verified that I am speaking with the correct person using two identifiers.  Location: Patient: home alone Provider: home    I discussed the limitations of evaluation and management by telemedicine and the availability of in person appointments. The patient expressed understanding and agreed to proceed.  History of Present Illness: Pt is home with no complaints She needs refills on all her meds and has had no issues with the meds   Observations/Objective: There were no vitals filed for this visit. Pt is in NAD   Assessment and Plan: 1. Attention deficit disorder (ADD) without hyperactivity Stable-- con't meds Pt has had no Side effects  No complaints - lisdexamfetamine (VYVANSE) 50 MG capsule; Take 1 capsule (50 mg total) by mouth daily.  Dispense: 30 capsule; Refill: 0 - lisdexamfetamine (VYVANSE) 50 MG capsule; Take 1 capsule (50 mg total) by mouth daily.  Dispense: 30 capsule; Refill: 0  2. Attention deficit hyperactivity disorder (ADHD), unspecified ADHD type  - lisdexamfetamine (VYVANSE) 50 MG capsule; Take 1 capsule (50 mg total) by mouth daily.  Dispense: 30 capsule; Refill: 0  3. Fibromyalgia Stable con't meds  - DULoxetine (CYMBALTA) 60 MG capsule; Take 1 capsule (60 mg total) by mouth 2 (two) times daily.  Dispense: 180 capsule; Refill: 1  4. Depression with anxiety Cont cymbalta  con't wellbutrin Symptoms stable  No complaints  - buPROPion (WELLBUTRIN XL) 150 MG 24 hr tablet; Take 1 tablet (150 mg total) by mouth daily.  Dispense: 90 tablet; Refill: 1   Follow Up Instructions:    I discussed the assessment and treatment plan with the patient. The patient was provided an opportunity to ask questions and all were answered. The patient agreed with the plan and demonstrated an understanding of the instructions.   The  patient was advised to call back or seek an in-person evaluation if the symptoms worsen or if the condition fails to improve as anticipated.  I provided 15 minutes of non-face-to-face time during this encounter.   Ann Held, DO

## 2019-08-05 ENCOUNTER — Other Ambulatory Visit (INDEPENDENT_AMBULATORY_CARE_PROVIDER_SITE_OTHER): Payer: Self-pay | Admitting: Physician Assistant

## 2019-08-05 DIAGNOSIS — E559 Vitamin D deficiency, unspecified: Secondary | ICD-10-CM

## 2019-08-06 ENCOUNTER — Encounter: Payer: Self-pay | Admitting: Family Medicine

## 2019-08-08 ENCOUNTER — Encounter (INDEPENDENT_AMBULATORY_CARE_PROVIDER_SITE_OTHER): Payer: Self-pay | Admitting: Physician Assistant

## 2019-08-08 ENCOUNTER — Other Ambulatory Visit: Payer: Self-pay

## 2019-08-08 ENCOUNTER — Telehealth (INDEPENDENT_AMBULATORY_CARE_PROVIDER_SITE_OTHER): Payer: BC Managed Care – PPO | Admitting: Physician Assistant

## 2019-08-08 ENCOUNTER — Encounter (INDEPENDENT_AMBULATORY_CARE_PROVIDER_SITE_OTHER): Payer: Self-pay

## 2019-08-08 DIAGNOSIS — E669 Obesity, unspecified: Secondary | ICD-10-CM | POA: Diagnosis not present

## 2019-08-08 DIAGNOSIS — E559 Vitamin D deficiency, unspecified: Secondary | ICD-10-CM | POA: Diagnosis not present

## 2019-08-08 DIAGNOSIS — Z6830 Body mass index (BMI) 30.0-30.9, adult: Secondary | ICD-10-CM

## 2019-08-08 DIAGNOSIS — E7849 Other hyperlipidemia: Secondary | ICD-10-CM | POA: Diagnosis not present

## 2019-08-08 DIAGNOSIS — E119 Type 2 diabetes mellitus without complications: Secondary | ICD-10-CM | POA: Diagnosis not present

## 2019-08-08 MED ORDER — METFORMIN HCL 1000 MG PO TABS: 1000.0000 mg | ORAL_TABLET | Freq: Two times a day (BID) | ORAL | 0 refills | Status: DC

## 2019-08-08 MED ORDER — VITAMIN D (ERGOCALCIFEROL) 1.25 MG (50000 UNIT) PO CAPS: 50000.0000 [IU] | ORAL_CAPSULE | ORAL | 0 refills | Status: DC

## 2019-08-08 MED ORDER — JARDIANCE 25 MG PO TABS: 25.0000 mg | ORAL_TABLET | Freq: Every day | ORAL | 0 refills | Status: DC

## 2019-08-09 NOTE — Progress Notes (Signed)
Office: [phone]  /  Fax: [phone] TeleHealth Visit:  Jillian Green has verbally consented to this TeleHealth visit today. The patient is located at work, the provider is located at the [employer] [facility] office. The participants in this visit include the listed provider and patient and any and all parties involved. The visit was conducted today via WebEx.  HPI:  Chief Complaint: OBESITY Jillian Green is here to discuss her progress with her obesity treatment plan. She is on the Category 3 Plan and states she is following her eating plan approximately 50 % of the time. She states she is walking for 30 minutes. Jillian Green's most recent weight is at 161 pounds (08/08/19). She reports that she did not follow the plan while home for the holidays. She is going to start doing yoga regularly.  Diabetes II Jillian Green has a diagnosis of diabetes. She is on Jardiance and Metformin. She forgot her glucometer over the holiday, so she did not check her blood sugars. Her blood sugar this morning was 139.  Vitamin D deficiency Jillian Green has a diagnosis of vitamin D deficiency. She is on vitamin D and she has no nausea, vomiting or muscle weakness.  Hyperlipidemia Jillian Green has a diagnosis of hyperlipidemia. She is on Atorvastatin. Jillian Green has no chest pain. She is not exercising regularly.  ASSESSMENT AND PLAN:  Type 2 diabetes mellitus without complication, without long-term current use of insulin (HCC) - Plan: Comprehensive Metabolic Panel (CMET), HgB A1c, Insulin, random, metFORMIN (GLUCOPHAGE) 1000 MG tablet, empagliflozin (JARDIANCE) 25 MG TABS tablet  Vitamin D deficiency - Plan: Vitamin D (25 hydroxy), Vitamin D, Ergocalciferol, (DRISDOL) 1.25 MG (50000 UT) CAPS capsule  Other hyperlipidemia - Plan: Lipid Panel With LDL/HDL Ratio  Class 1 obesity with serious comorbidity and body mass index (BMI) of 30.0 to 30.9 in adult, unspecified obesity type - Starting BMI greater then 30  PLAN:  Diabetes  II Jillian Green has been given diabetes education by myself today. Good blood sugar control is important to decrease the likelihood of diabetic complications such as nephropathy, neuropathy, limb loss, blindness, coronary artery disease, and death. Intensive lifestyle modification including diet, exercise and weight loss were discussed as the first line treatment for diabetes. Deisy agrees to continue Jardiance 25 mg daily #30 with no refills and Metformin 1,000 mg 2 times daily #60 with no refills and follow up with our clinic in 3 to 4 weeks.  Vitamin D deficiency Low Vitamin D level contributes to fatigue and are associated with obesity, breast, and colon cancer. Jillian Green agrees to continue to take prescription Vitamin D @50 ,000 IU every 14 days #2 with no refills and she will follow-up for routine testing of vitamin D, at least 2-3 times per year to avoid over-replacement. Jillian Green agrees to follow up with our clinic in 3 to 4 weeks.  Hyperlipidemia Cardiovascular risk and specific lipid/LDL goals reviewed.  We discussed several lifestyle modifications today and Jillian Green will continue to work on diet, exercise and weight loss efforts. Jillian Green will continue with medications and the plan and she will follow up as directed. Orders and follow up as documented in patient record.   Counseling Intensive lifestyle modifications are the first line treatment for this issue. . Dietary changes: Increase soluble fiber. Decrease simple carbohydrates. . Exercise changes: Moderate to vigorous-intensity aerobic activity 150 minutes per week if tolerated. . Lipid-lowering medications: see documented in medical record.  Obesity Jillian Green is currently in the action stage of change. As such, her goal is to continue  with weight loss efforts. She has agreed to Category 3 Plan. Jillian Green has been instructed to work up to a goal of 150 minutes of combined cardio and strengthening exercise per week for weight loss and overall health  benefits. We discussed the following Behavioral Modification Strategies today: meal planning and cooking strategies and keeping healthy foods in the home.  Jillian Green has agreed to follow-up with our clinic in 3 to 4 weeks. She was informed of the importance of frequent follow-up visits to maximize her success with intensive lifestyle modifications for her multiple health conditions.  ALLERGIES: Allergies  Allergen Reactions  . Augmentin [Amoxicillin-Pot Clavulanate]     Severe yeast infections    MEDICATIONS: Current Outpatient Medications on File Prior to Visit  Medication Sig Dispense Refill  . ALPRAZolam (XANAX) 0.5 MG tablet Take 1 tablet (0.5 mg total) by mouth 3 (three) times daily as needed for anxiety. 45 tablet 1  . atorvastatin (LIPITOR) 10 MG tablet Take 1 tablet (10 mg total) by mouth daily. 30 tablet 0  . azelastine (OPTIVAR) 0.05 % ophthalmic solution Place 1 drop into both eyes 2 (two) times daily as needed. 18 mL 1  . BD PEN NEEDLE NANO U/F 32G X 4 MM MISC USE TWICE A DAY 100 each 0  . buPROPion (WELLBUTRIN XL) 150 MG 24 hr tablet Take 1 tablet (150 mg total) by mouth daily. 90 tablet 1  . DULoxetine (CYMBALTA) 60 MG capsule Take 1 capsule (60 mg total) by mouth 2 (two) times daily. 180 capsule 1  . hydrOXYzine (ATARAX/VISTARIL) 25 MG tablet TAKE 1 TO 2 TABLETS BY MOUTH AT BEDTIME AS NEEDED FOR SLEEP 30 tablet 0  . levocetirizine (XYZAL) 5 MG tablet TAKE 1 TABLET BY MOUTH EVERY DAY IN THE EVENING 90 tablet 0  . lisdexamfetamine (VYVANSE) 50 MG capsule Take 1 capsule (50 mg total) by mouth daily. 30 capsule 0  . lisdexamfetamine (VYVANSE) 50 MG capsule Take 1 capsule (50 mg total) by mouth daily. 30 capsule 0  . lisdexamfetamine (VYVANSE) 50 MG capsule Take 1 capsule (50 mg total) by mouth daily. 30 capsule 0  . metroNIDAZOLE (METROGEL) 1 % gel Apply topically daily. 45 g 0  . NONFORMULARY OR COMPOUNDED ITEM Tiger balm patches 1 patch daily prn 30 each 5  . omeprazole  (PRILOSEC) 20 MG capsule Take 1 capsule (20 mg total) by mouth daily. 90 capsule 0  . OneTouch Delica Lancets 30G MISC 1 Device by Does not apply route daily. 100 each 0  . ONETOUCH ULTRA test strip USE AS INSTRUCTED 100 strip 0  . rizatriptan (MAXALT-MLT) 10 MG disintegrating tablet Take 1 tablet (10 mg total) by mouth as needed for migraine. May repeat in 2 hours if needed 10 tablet 0  . Sulfacetamide Sodium, Acne, 10 % LOTN Use small amount qhs 118 mL 3   No current facility-administered medications on file prior to visit.    PAST MEDICAL HISTORY: Past Medical History:  Diagnosis Date  . ADD (attention deficit disorder)   . Anxiety   . Back pain   . Chronic headaches   . Depression   . Fibromyalgia   . GERD (gastroesophageal reflux disease)   . Itchy skin   . Joint pain   . Lactose intolerance   . Migraine   . Sweating increase   . Type 2 diabetes mellitus without complication, without long-term current use of insulin (HCC) 09/13/2018  . Unspecified eustachian tube disorder     PAST SURGICAL HISTORY: Past  Surgical History:  Procedure Laterality Date  . CESAREAN SECTION    . OTHER SURGICAL HISTORY     preforated uterine surgery  . SINUS EXPLORATION      SOCIAL HISTORY: Social History   Tobacco Use  . Smoking status: Former Smoker    Years: 8.00    Quit date: 05/15/2006    Years since quitting: 13.2  . Smokeless tobacco: Never Used  . Tobacco comment: 1 cig every 4 months  Substance Use Topics  . Alcohol use: Yes    Alcohol/week: 1.0 standard drinks    Types: 1 Glasses of wine per week  . Drug use: No    FAMILY HISTORY: Family History  Problem Relation Age of Onset  . Other Father        Dyslexia  . Heart disease Father   . Hypertension Father   . Alcoholism Father   . Hypertension Mother   . Thyroid disease Mother   . Anxiety disorder Mother   . Sleep apnea Mother   . Obesity Mother   . ADD / ADHD Sister     ROS: Review of Systems   Constitutional: Negative for weight loss.  Gastrointestinal: Negative for nausea and vomiting.  Musculoskeletal:       Negative for muscle weakness    PHYSICAL EXAM: There were no vitals taken for this visit. There is no height or weight on file to calculate BMI.  RECENT LABS AND TESTS: BMET    Component Value Date/Time   NA 141 03/11/2019 1119   K 4.2 03/11/2019 1119   CL 102 03/11/2019 1119   CO2 22 03/11/2019 1119   GLUCOSE 115 (H) 03/11/2019 1119   GLUCOSE 187 (H) 10/23/2016 1348   BUN 11 03/11/2019 1119   CREATININE 0.77 03/11/2019 1119   CALCIUM 9.1 03/11/2019 1119   GFRNONAA 93 03/11/2019 1119   GFRAA 107 03/11/2019 1119   Lab Results  Component Value Date   HGBA1C 5.8 (H) 03/11/2019   HGBA1C 7.1 (H) 09/13/2018   HGBA1C 7.7 (H) 04/29/2018   Lab Results  Component Value Date   INSULIN 7.6 03/11/2019   INSULIN 15.9 09/13/2018   INSULIN 18.4 04/29/2018   CBC    Component Value Date/Time   WBC 6.8 04/29/2018 1311   WBC 8.0 10/23/2016 1348   RBC 4.44 04/29/2018 1311   RBC 4.31 10/23/2016 1348   HGB 13.7 04/29/2018 1311   HCT 42.3 04/29/2018 1311   PLT 305 10/23/2016 1348   MCV 95 04/29/2018 1311   MCH 30.9 04/29/2018 1311   MCH 31.8 10/23/2016 1348   MCHC 32.4 04/29/2018 1311   MCHC 33.9 10/23/2016 1348   RDW 12.8 04/29/2018 1311   LYMPHSABS 2.2 04/29/2018 1311   MONOABS 0.3 09/23/2013 0950   EOSABS 0.2 04/29/2018 1311   BASOSABS 0.0 04/29/2018 1311   Iron/TIBC/Ferritin/ %Sat    Component Value Date/Time   IRON 156 (H) 09/23/2013 0950   FERRITIN 103.5 09/23/2013 0950   IRONPCTSAT 38.0 09/23/2013 0950   Lipid Panel     Component Value Date/Time   CHOL 133 03/11/2019 1119   TRIG 67 03/11/2019 1119   HDL 52 03/11/2019 1119   LDLCALC 68 03/11/2019 1119   Hepatic Function Panel     Component Value Date/Time   PROT 6.7 03/11/2019 1119   ALBUMIN 4.8 03/11/2019 1119   AST 17 03/11/2019 1119   ALT 33 (H) 03/11/2019 1119   ALKPHOS 102  03/11/2019 1119   BILITOT 0.4 03/11/2019 1119  BILIDIR 0.0 09/23/2013 0950      Component Value Date/Time   TSH 0.770 04/29/2018 1311   TSH 1.63 05/15/2016 1557   TSH 0.99 09/23/2013 0950    Ref. Range 03/11/2019 11:19  Vitamin D, 25-Hydroxy Latest Ref Range: 30.0 - 100.0 ng/mL 41.2   I, [place] Crane, am acting as Energy manager for Alois Cliche, PA-C I, Alois Cliche, PA-C have reviewed above note and agree with its content

## 2019-08-22 ENCOUNTER — Encounter (INDEPENDENT_AMBULATORY_CARE_PROVIDER_SITE_OTHER): Payer: Self-pay | Admitting: Physician Assistant

## 2019-08-22 NOTE — Telephone Encounter (Signed)
Please advise 

## 2019-08-22 NOTE — Telephone Encounter (Signed)
Ok to refill atorvastatin until her next visit. If she has been using his glucometer, find out what it is so we can send the same one and strips, unless her insurance approves a certain one. Ok to schedule her a couple of Wednesdays out. Thanks

## 2019-08-22 NOTE — Telephone Encounter (Signed)
Jillian Green has been scheduled on Wed, 2/3 at 3:00.

## 2019-08-23 ENCOUNTER — Other Ambulatory Visit (INDEPENDENT_AMBULATORY_CARE_PROVIDER_SITE_OTHER): Payer: Self-pay

## 2019-08-23 DIAGNOSIS — E119 Type 2 diabetes mellitus without complications: Secondary | ICD-10-CM

## 2019-08-23 DIAGNOSIS — E7849 Other hyperlipidemia: Secondary | ICD-10-CM

## 2019-08-23 LAB — COMPREHENSIVE METABOLIC PANEL
ALT: 18 IU/L (ref 0–32)
AST: 13 IU/L (ref 0–40)
Albumin/Globulin Ratio: 2 (ref 1.2–2.2)
Albumin: 4.6 g/dL (ref 3.8–4.8)
Alkaline Phosphatase: 116 IU/L (ref 39–117)
BUN/Creatinine Ratio: 16 (ref 9–23)
BUN: 13 mg/dL (ref 6–24)
Bilirubin Total: 0.4 mg/dL (ref 0.0–1.2)
CO2: 24 mmol/L (ref 20–29)
Calcium: 9.8 mg/dL (ref 8.7–10.2)
Chloride: 103 mmol/L (ref 96–106)
Creatinine, Ser: 0.82 mg/dL (ref 0.57–1.00)
GFR calc Af Amer: 99 mL/min/{1.73_m2} (ref >59)
GFR calc non Af Amer: 85 mL/min/{1.73_m2} (ref >59)
Globulin, Total: 2.3 g/dL (ref 1.5–4.5)
Glucose: 136 mg/dL — ABNORMAL HIGH (ref 65–99)
Potassium: 4.6 mmol/L (ref 3.5–5.2)
Sodium: 140 mmol/L (ref 134–144)
Total Protein: 6.9 g/dL (ref 6.0–8.5)

## 2019-08-23 LAB — LIPID PANEL WITH LDL/HDL RATIO
Cholesterol, Total: 183 mg/dL (ref 100–199)
HDL: 66 mg/dL (ref >39)
LDL Chol Calc (NIH): 103 mg/dL — ABNORMAL HIGH (ref 0–99)
LDL/HDL Ratio: 1.6 ratio (ref 0.0–3.2)
Triglycerides: 73 mg/dL (ref 0–149)
VLDL Cholesterol Cal: 14 mg/dL (ref 5–40)

## 2019-08-23 LAB — HEMOGLOBIN A1C
Est. average glucose Bld gHb Est-mCnc: 123 mg/dL
Hgb A1c MFr Bld: 5.9 % — ABNORMAL HIGH (ref 4.8–5.6)

## 2019-08-23 LAB — INSULIN, RANDOM: INSULIN: 13.8 u[IU]/mL (ref 2.6–24.9)

## 2019-08-23 LAB — VITAMIN D 25 HYDROXY (VIT D DEFICIENCY, FRACTURES): Vit D, 25-Hydroxy: 60.4 ng/mL (ref 30.0–100.0)

## 2019-08-23 MED ORDER — BLOOD GLUCOSE METER KIT: PACK | 0 refills | Status: AC

## 2019-08-23 MED ORDER — ATORVASTATIN CALCIUM 10 MG PO TABS: 10.0000 mg | ORAL_TABLET | Freq: Every day | ORAL | 0 refills | Status: DC

## 2019-08-23 NOTE — Telephone Encounter (Signed)
Done sent in glucometer per insurance

## 2019-08-30 ENCOUNTER — Other Ambulatory Visit (INDEPENDENT_AMBULATORY_CARE_PROVIDER_SITE_OTHER): Payer: Self-pay | Admitting: Physician Assistant

## 2019-08-30 DIAGNOSIS — E7849 Other hyperlipidemia: Secondary | ICD-10-CM

## 2019-08-31 ENCOUNTER — Other Ambulatory Visit (INDEPENDENT_AMBULATORY_CARE_PROVIDER_SITE_OTHER): Payer: Self-pay | Admitting: Physician Assistant

## 2019-08-31 DIAGNOSIS — E559 Vitamin D deficiency, unspecified: Secondary | ICD-10-CM

## 2019-08-31 DIAGNOSIS — E119 Type 2 diabetes mellitus without complications: Secondary | ICD-10-CM

## 2019-09-07 ENCOUNTER — Other Ambulatory Visit (INDEPENDENT_AMBULATORY_CARE_PROVIDER_SITE_OTHER): Payer: Self-pay | Admitting: Physician Assistant

## 2019-09-07 ENCOUNTER — Ambulatory Visit (INDEPENDENT_AMBULATORY_CARE_PROVIDER_SITE_OTHER): Payer: BC Managed Care – PPO | Admitting: Physician Assistant

## 2019-09-07 ENCOUNTER — Encounter (INDEPENDENT_AMBULATORY_CARE_PROVIDER_SITE_OTHER): Payer: Self-pay | Admitting: Physician Assistant

## 2019-09-07 DIAGNOSIS — E7849 Other hyperlipidemia: Secondary | ICD-10-CM

## 2019-09-07 DIAGNOSIS — E119 Type 2 diabetes mellitus without complications: Secondary | ICD-10-CM

## 2019-09-12 MED ORDER — ATORVASTATIN CALCIUM 10 MG PO TABS: 10.0000 mg | ORAL_TABLET | Freq: Every day | ORAL | 0 refills | Status: DC

## 2019-09-12 MED ORDER — JARDIANCE 25 MG PO TABS: 25.0000 mg | ORAL_TABLET | Freq: Every day | ORAL | 0 refills | Status: DC

## 2019-09-13 ENCOUNTER — Encounter (INDEPENDENT_AMBULATORY_CARE_PROVIDER_SITE_OTHER): Payer: Self-pay | Admitting: Physician Assistant

## 2019-09-14 MED ORDER — METFORMIN HCL 1000 MG PO TABS: 1000.0000 mg | ORAL_TABLET | Freq: Two times a day (BID) | ORAL | 0 refills | Status: DC

## 2019-09-14 NOTE — Addendum Note (Signed)
Addended by: Jeralene Peters on: 09/14/2019 07:02 AM   Modules accepted: Orders

## 2019-09-18 ENCOUNTER — Other Ambulatory Visit (INDEPENDENT_AMBULATORY_CARE_PROVIDER_SITE_OTHER): Payer: Self-pay | Admitting: Physician Assistant

## 2019-09-18 DIAGNOSIS — E559 Vitamin D deficiency, unspecified: Secondary | ICD-10-CM

## 2019-09-20 IMAGING — MG DIGITAL SCREENING BILATERAL MAMMOGRAM WITH TOMO AND CAD
8 series · 8 of 24 positions shown · non-contrast
Comparison: Previous exam(s).

CLINICAL DATA: Screening.

EXAM:
DIGITAL SCREENING BILATERAL MAMMOGRAM WITH TOMO AND CAD

[L MLO synth-2D]
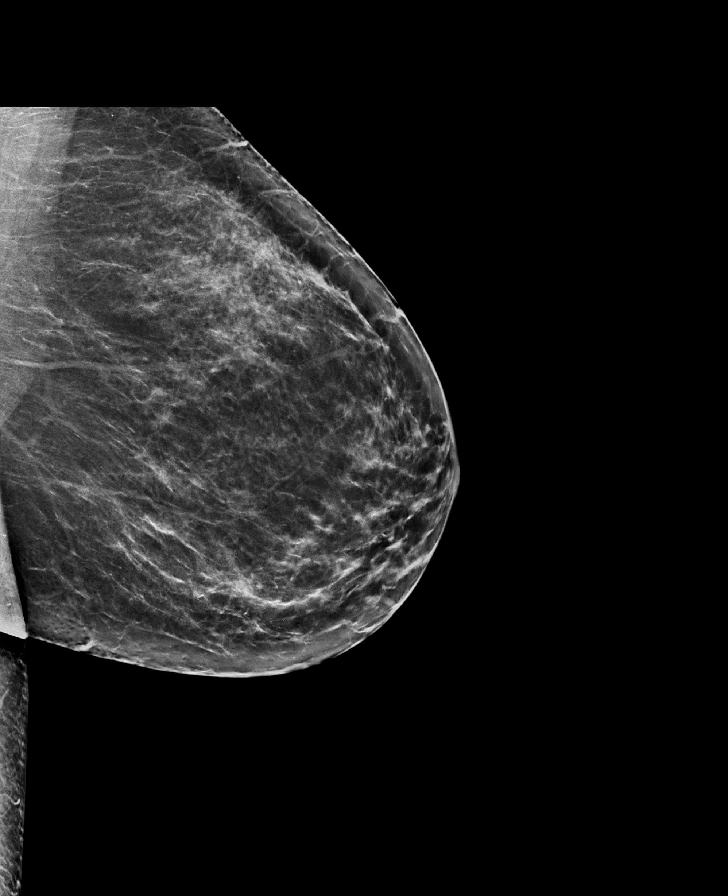

[L CC synth-2D]
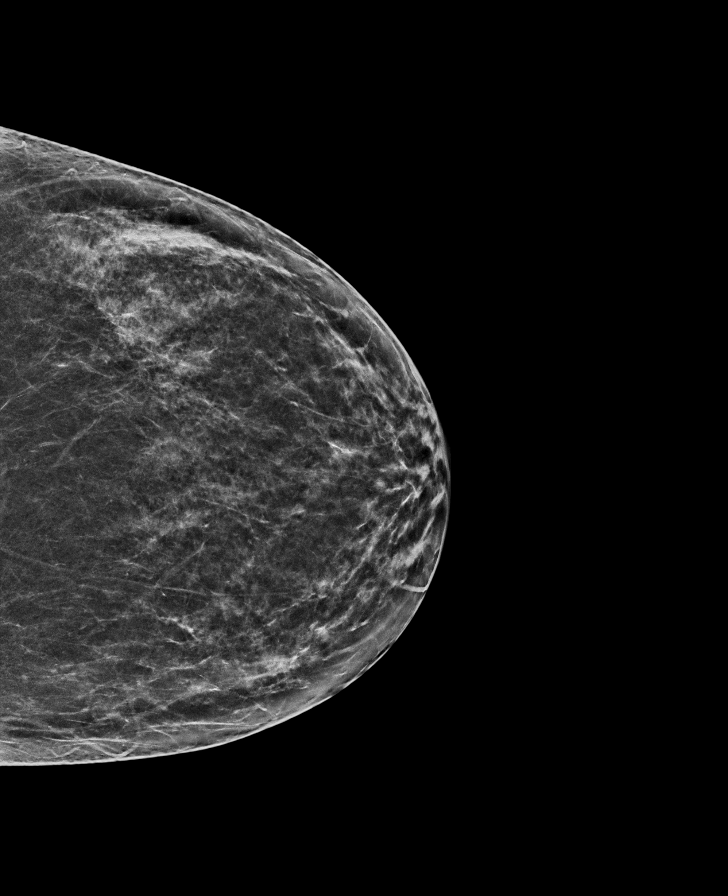

[R MLO synth-2D]
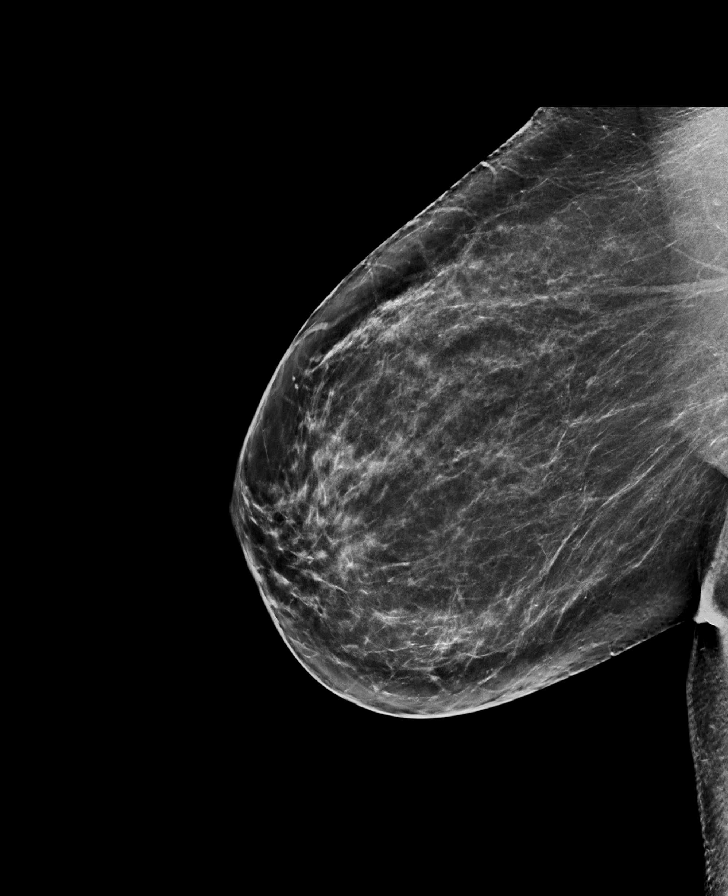

[R CC synth-2D]
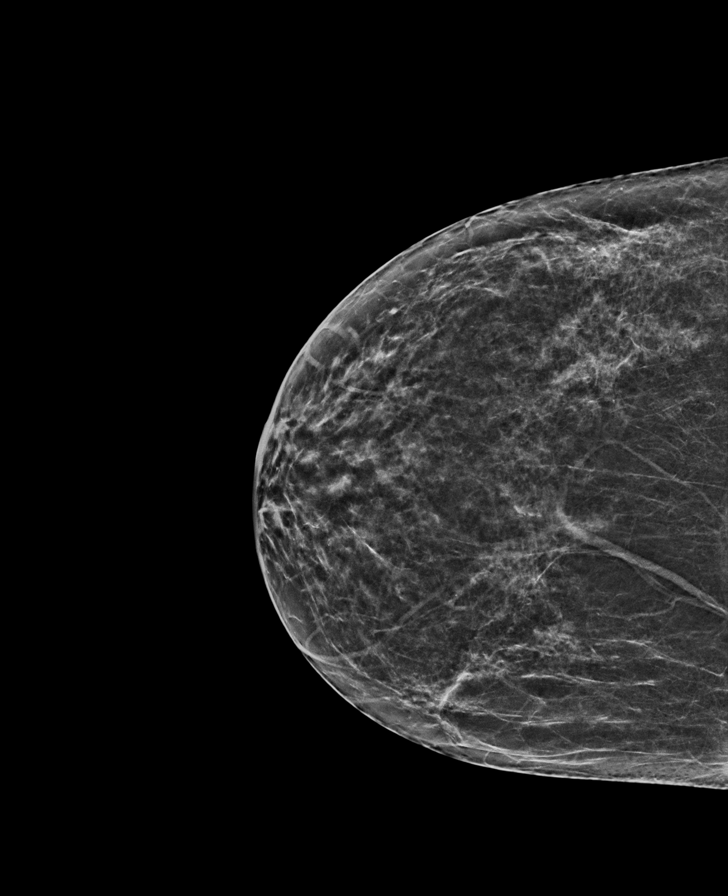

[R CC tomo · tomo slice 30/59.0]
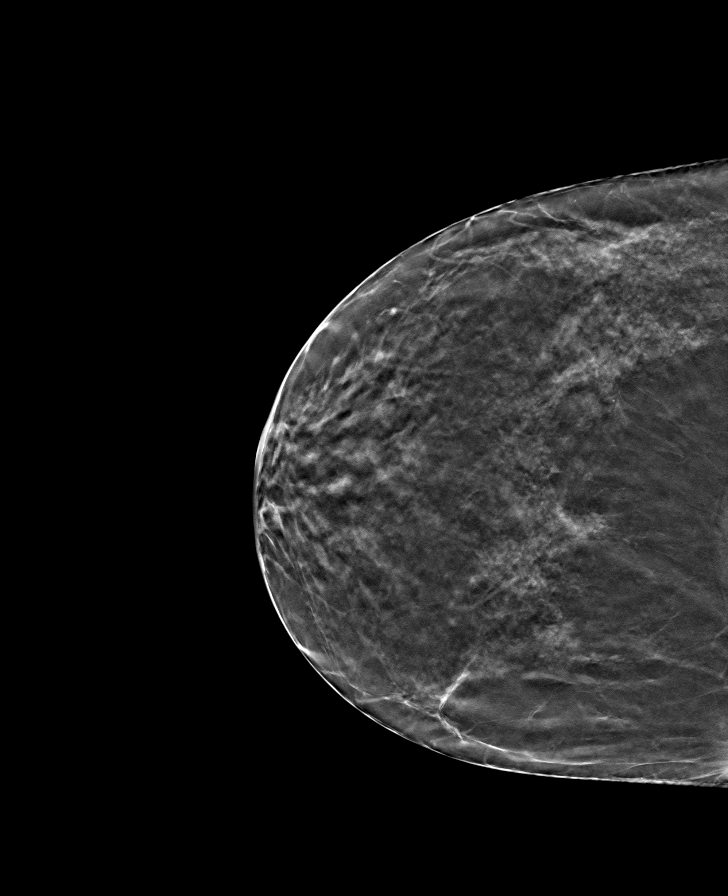

[L CC tomo · tomo slice 31/60.0]
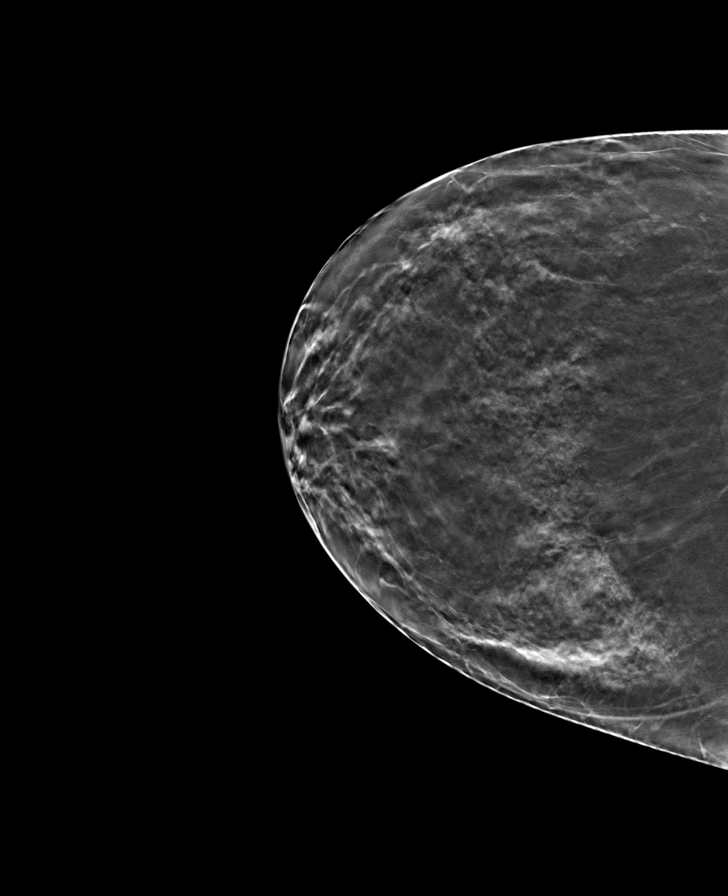

[R MLO tomo · tomo slice 39/76.0]
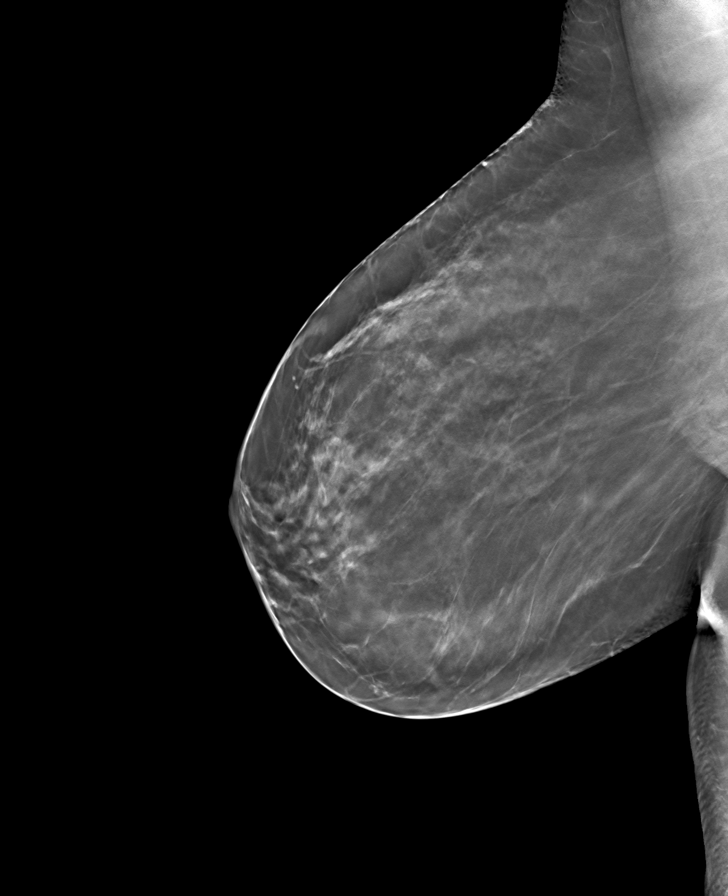

[L MLO tomo · tomo slice 36/71.0]
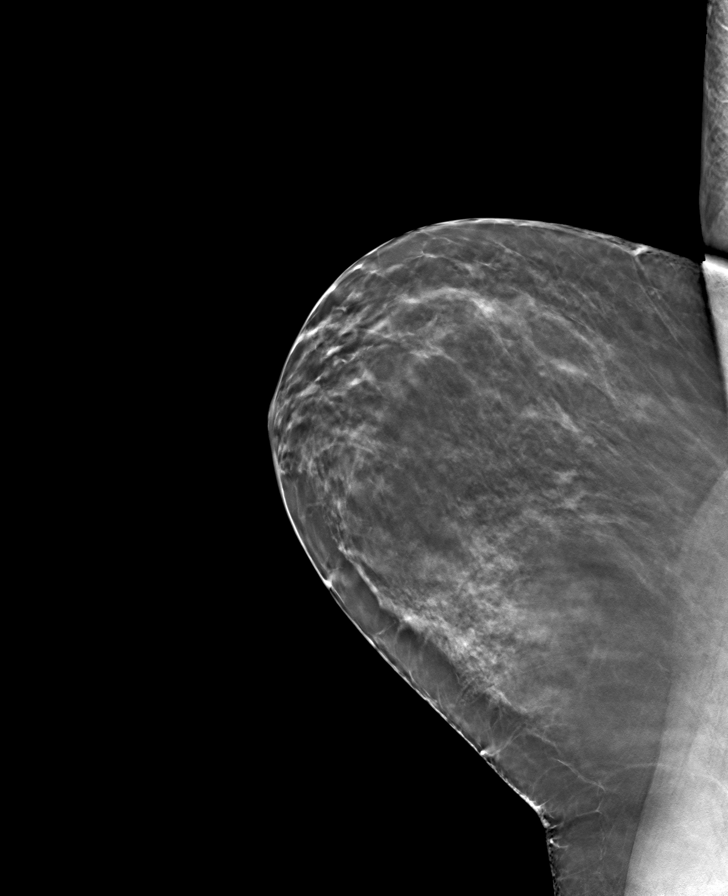

[8 of 24 positions shown; findings below may reference images not displayed]

ACR Breast Density Category b: There are scattered areas of
fibroglandular density.
FINDINGS: There are no findings suspicious for malignancy. Images were
processed with CAD.
IMPRESSION: No mammographic evidence of malignancy. A result letter of this
screening mammogram will be mailed directly to the patient.

RECOMMENDATION:
Screening mammogram in one year. (Code:[id number])

BI-RADS CATEGORY  1: Negative.

## 2019-09-21 ENCOUNTER — Encounter (INDEPENDENT_AMBULATORY_CARE_PROVIDER_SITE_OTHER): Payer: Self-pay | Admitting: Physician Assistant

## 2019-09-21 ENCOUNTER — Other Ambulatory Visit: Payer: Self-pay

## 2019-09-21 ENCOUNTER — Telehealth (INDEPENDENT_AMBULATORY_CARE_PROVIDER_SITE_OTHER): Payer: BC Managed Care – PPO | Admitting: Physician Assistant

## 2019-09-21 DIAGNOSIS — E119 Type 2 diabetes mellitus without complications: Secondary | ICD-10-CM

## 2019-09-21 DIAGNOSIS — E669 Obesity, unspecified: Secondary | ICD-10-CM

## 2019-09-21 DIAGNOSIS — B379 Candidiasis, unspecified: Secondary | ICD-10-CM | POA: Diagnosis not present

## 2019-09-21 DIAGNOSIS — E7849 Other hyperlipidemia: Secondary | ICD-10-CM

## 2019-09-21 DIAGNOSIS — Z6830 Body mass index (BMI) 30.0-30.9, adult: Secondary | ICD-10-CM

## 2019-09-21 MED ORDER — FLUCONAZOLE 150 MG PO TABS: ORAL_TABLET | ORAL | 0 refills | Status: DC

## 2019-09-21 MED ORDER — METFORMIN HCL 1000 MG PO TABS: 1000.0000 mg | ORAL_TABLET | Freq: Two times a day (BID) | ORAL | 0 refills | Status: DC

## 2019-09-21 MED ORDER — ATORVASTATIN CALCIUM 10 MG PO TABS: 10.0000 mg | ORAL_TABLET | Freq: Every day | ORAL | 0 refills | Status: DC

## 2019-09-21 MED ORDER — JARDIANCE 25 MG PO TABS: 25.0000 mg | ORAL_TABLET | Freq: Every day | ORAL | 0 refills | Status: DC

## 2019-09-21 NOTE — Progress Notes (Signed)
TeleHealth Visit:  Due to the COVID-19 pandemic, this visit was completed with telemedicine (audio/video) technology to reduce patient and provider exposure as well as to preserve personal protective equipment.   Jillian Green has verbally consented to this TeleHealth visit. The patient is located at work, the provider is located at the [employer] [facility] office. The participants in this visit include the listed provider and patient. The visit was conducted today via Webex.  Chief Complaint: OBESITY Jillian Green is here to discuss her progress with her obesity treatment plan along with follow-up of her obesity related diagnoses. Jillian Green is on the Category 3 Plan and states she is following her eating plan approximately 60% of the time. Jillian Green states she is exercising for 0 minutes 0 times per week.  Today's visit was #: 23 Starting weight: 184 lbs Starting date: 04/29/2018  Interim History: Jamecia's most recent weight is 160 pounds.  She reports that she is tired of eating many of the foods on Category 3.  Subjective:   1. Other hyperlipidemia Jillian Green has hyperlipidemia and has been trying to improve her cholesterol levels with intensive lifestyle modification including a low saturated fat diet, exercise and weight loss. She denies any chest pain, claudication or myalgias.  She is taking Lipitor.  She ran out a few weeks ago.  Lab Results  Component Value Date   ALT 18 08/22/2019   AST 13 08/22/2019   ALKPHOS 116 08/22/2019   BILITOT 0.4 08/22/2019   Lab Results  Component Value Date   CHOL 183 08/22/2019   HDL 66 08/22/2019   LDLCALC 103 (H) 08/22/2019   TRIG 73 08/22/2019   2. Type 2 diabetes mellitus without complication, without long-term current use of insulin (HCC) Jillian Green is on metformin and Jardiance.  No hypoglycemia or polyphagia.  Fasting blood sugar 99-127.  Lab Results  Component Value Date   HGBA1C 5.9 (H) 08/22/2019   HGBA1C 5.8 (H) 03/11/2019   HGBA1C 7.1 (H) 09/13/2018    Lab Results  Component Value Date   LDLCALC 103 (H) 08/22/2019   CREATININE 0.82 08/22/2019   Lab Results  Component Value Date   INSULIN 13.8 08/22/2019   INSULIN 7.6 03/11/2019   INSULIN 15.9 09/13/2018   INSULIN 18.4 04/29/2018   3. Yeast infection Potentially from Jardiance.  She also reports eating a little more sugar than normal.  Assessment/Plan:   1. Other hyperlipidemia Cardiovascular risk and specific lipid/LDL goals reviewed.  We discussed several lifestyle modifications today and Jillian Green will continue to work on diet, exercise and weight loss efforts. Orders and follow up as documented in patient record.   Counseling Intensive lifestyle modifications are the first line treatment for this issue. . Dietary changes: Increase soluble fiber. Decrease simple carbohydrates. . Exercise changes: Moderate to vigorous-intensity aerobic activity 150 minutes per week if tolerated. . Lipid-lowering medications: see documented in medical record. - atorvastatin (LIPITOR) 10 MG tablet; Take 1 tablet (10 mg total) by mouth daily.  Dispense: 30 tablet; Refill: 0  2. Type 2 diabetes mellitus without complication, without long-term current use of insulin (HCC) Good blood sugar control is important to decrease the likelihood of diabetic complications such as nephropathy, neuropathy, limb loss, blindness, coronary artery disease, and death. Intensive lifestyle modification including diet, exercise and weight loss are the first line of treatment for diabetes.  - metFORMIN (GLUCOPHAGE) 1000 MG tablet; Take 1 tablet (1,000 mg total) by mouth 2 (two) times daily with a meal.  Dispense: 60 tablet; Refill: 0 -  empagliflozin (JARDIANCE) 25 MG TABS tablet; Take 25 mg by mouth daily.  Dispense: 30 tablet; Refill: 0  3. Yeast infection Will treat with fluconazole, as below. - fluconazole (DIFLUCAN) 150 MG tablet; Take 1 tablet today and 1 tablet in 7 days  Dispense: 2 tablet; Refill: 0  4. Class 1  obesity with serious comorbidity and body mass index (BMI) of 30.0 to 30.9 in adult, unspecified obesity type Jillian Green is currently in the action stage of change. As such, her goal is to continue with weight loss efforts. She has agreed to the Category 3 Plan.   Exercise goals: For substantial health benefits, adults should do at least 150 minutes (2 hours and 30 minutes) a week of moderate-intensity, or 75 minutes (1 hour and 15 minutes) a week of vigorous-intensity aerobic physical activity, or an equivalent combination of moderate- and vigorous-intensity aerobic activity. Aerobic activity should be performed in episodes of at least 10 minutes, and preferably, it should be spread throughout the week.  Behavioral modification strategies: meal planning and cooking strategies and keeping healthy foods in the home.  Jillian Green has agreed to follow-up with our clinic in 4 weeks. She was informed of the importance of frequent follow-up visits to maximize her success with intensive lifestyle modifications for her multiple health conditions.  Objective:   VITALS: Per patient if applicable, see vitals. GENERAL: Alert and in no acute distress. CARDIOPULMONARY: No increased WOB. Speaking in clear sentences.  PSYCH: Pleasant and cooperative. Speech normal rate and rhythm. Affect is appropriate. Insight and judgement are appropriate. Attention is focused, linear, and appropriate.  NEURO: Oriented as arrived to appointment on time with no prompting.   Lab Results  Component Value Date   CREATININE 0.82 08/22/2019   BUN 13 08/22/2019   NA 140 08/22/2019   K 4.6 08/22/2019   CL 103 08/22/2019   CO2 24 08/22/2019   Lab Results  Component Value Date   ALT 18 08/22/2019   AST 13 08/22/2019   ALKPHOS 116 08/22/2019   BILITOT 0.4 08/22/2019   Lab Results  Component Value Date   HGBA1C 5.9 (H) 08/22/2019   HGBA1C 5.8 (H) 03/11/2019   HGBA1C 7.1 (H) 09/13/2018   HGBA1C 7.7 (H) 04/29/2018   Lab Results    Component Value Date   INSULIN 13.8 08/22/2019   INSULIN 7.6 03/11/2019   INSULIN 15.9 09/13/2018   INSULIN 18.4 04/29/2018   Lab Results  Component Value Date   TSH 0.770 04/29/2018   Lab Results  Component Value Date   CHOL 183 08/22/2019   HDL 66 08/22/2019   LDLCALC 103 (H) 08/22/2019   TRIG 73 08/22/2019   Lab Results  Component Value Date   WBC 6.8 04/29/2018   HGB 13.7 04/29/2018   HCT 42.3 04/29/2018   MCV 95 04/29/2018   PLT 305 10/23/2016   Lab Results  Component Value Date   IRON 156 (H) 09/23/2013   FERRITIN 103.5 09/23/2013   Attestation Statements:   Reviewed by clinician on day of visit: allergies, medications, problem list, medical history, surgical history, family history, social history, and previous encounter notes.  I, Water quality scientist, CMA, am acting as Location manager for [employer], PA-C.  I have reviewed the above documentation for accuracy and completeness, and I agree with the above. Abby Potash, PA-C

## 2019-10-03 ENCOUNTER — Telehealth (INDEPENDENT_AMBULATORY_CARE_PROVIDER_SITE_OTHER): Payer: BC Managed Care – PPO | Admitting: Physician Assistant

## 2019-10-04 ENCOUNTER — Other Ambulatory Visit (INDEPENDENT_AMBULATORY_CARE_PROVIDER_SITE_OTHER): Payer: Self-pay | Admitting: Physician Assistant

## 2019-10-04 DIAGNOSIS — E7849 Other hyperlipidemia: Secondary | ICD-10-CM

## 2019-10-14 ENCOUNTER — Other Ambulatory Visit: Payer: Self-pay | Admitting: Family Medicine

## 2019-10-14 DIAGNOSIS — M797 Fibromyalgia: Secondary | ICD-10-CM

## 2019-10-19 ENCOUNTER — Encounter (INDEPENDENT_AMBULATORY_CARE_PROVIDER_SITE_OTHER): Payer: Self-pay | Admitting: Physician Assistant

## 2019-10-19 ENCOUNTER — Telehealth (INDEPENDENT_AMBULATORY_CARE_PROVIDER_SITE_OTHER): Payer: BC Managed Care – PPO | Admitting: Physician Assistant

## 2019-10-19 ENCOUNTER — Other Ambulatory Visit: Payer: Self-pay

## 2019-10-19 DIAGNOSIS — E119 Type 2 diabetes mellitus without complications: Secondary | ICD-10-CM | POA: Diagnosis not present

## 2019-10-19 DIAGNOSIS — E669 Obesity, unspecified: Secondary | ICD-10-CM | POA: Diagnosis not present

## 2019-10-19 DIAGNOSIS — Z6830 Body mass index (BMI) 30.0-30.9, adult: Secondary | ICD-10-CM | POA: Diagnosis not present

## 2019-10-19 MED ORDER — JARDIANCE 25 MG PO TABS: 25.0000 mg | ORAL_TABLET | Freq: Every day | ORAL | 0 refills | Status: DC

## 2019-10-19 MED ORDER — METFORMIN HCL 1000 MG PO TABS: 1000.0000 mg | ORAL_TABLET | Freq: Two times a day (BID) | ORAL | 0 refills | Status: DC

## 2019-10-19 NOTE — Progress Notes (Signed)
TeleHealth Visit:  Due to the COVID-19 pandemic, this visit was completed with telemedicine (audio/video) technology to reduce patient and provider exposure as well as to preserve personal protective equipment.   Jillian Green has verbally consented to this TeleHealth visit. The patient is located at work, the provider is located at the [employer] [facility] office. The participants in this visit include the listed provider and patient. The visit was conducted today via Webex.  Chief Complaint: OBESITY Jillian Green is here to discuss her progress with her obesity treatment plan along with follow-up of her obesity related diagnoses. Yong is on the Category 3 Plan and states she is following her eating plan approximately 60% of the time. Anjenette states she is walking 30 minutes 4-5 times per week.  Today's visit was #: 24 Starting weight: 184 lbs Starting date: 04/29/2018  Interim History: Jillian Green reports struggling with cravings for sugar. She has not been exercising regularly, but plans on it as the weather gets warmer.  Subjective:   Type 2 diabetes mellitus without complication, without long-term current use of insulin ([place]). Maylani is on atorvastatin and metformin. Fasting blood sugars range between 109 and 130. No chest pain or myalgias.  Lab Results  Component Value Date   HGBA1C 5.9 (H) 08/22/2019   HGBA1C 5.8 (H) 03/11/2019   HGBA1C 7.1 (H) 09/13/2018   Lab Results  Component Value Date   LDLCALC 103 (H) 08/22/2019   CREATININE 0.82 08/22/2019   Lab Results  Component Value Date   INSULIN 13.8 08/22/2019   INSULIN 7.6 03/11/2019   INSULIN 15.9 09/13/2018   INSULIN 18.4 04/29/2018   Assessment/Plan:   Type 2 diabetes mellitus without complication, without long-term current use of insulin ([place]).  Good blood sugar control is important to decrease the likelihood of diabetic complications such as nephropathy, neuropathy, limb loss, blindness, coronary artery disease, and  death. Intensive lifestyle modification including diet, exercise and weight loss are the first line of treatment for diabetes. Jillian Green was given refills on her empagliflozin (JARDIANCE) 25 MG TABS tablet #30 with 0 refills and metFORMIN (GLUCOPHAGE) 1000 MG tablet #60 with 0 refills.  Class 1 obesity with serious comorbidity and body mass index (BMI) of 30.0 to 30.9 in adult, unspecified obesity type - starting BMI was greater than 30.  Jillian Green is currently in the action stage of change. As such, her goal is to continue with weight loss efforts. She has agreed to the Category 3 Plan.   Exercise goals: For substantial health benefits, adults should do at least 150 minutes (2 hours and 30 minutes) a week of moderate-intensity, or 75 minutes (1 hour and 15 minutes) a week of vigorous-intensity aerobic physical activity, or an equivalent combination of moderate- and vigorous-intensity aerobic activity. Aerobic activity should be performed in episodes of at least 10 minutes, and preferably, it should be spread throughout the week.  Behavioral modification strategies: meal planning and cooking strategies and better snacking choices.  Jillian Green has agreed to follow-up with our clinic in 3 weeks. She was informed of the importance of frequent follow-up visits to maximize her success with intensive lifestyle modifications for her multiple health conditions.  Objective:   VITALS: Per patient if applicable, see vitals. GENERAL: Alert and in no acute distress. CARDIOPULMONARY: No increased WOB. Speaking in clear sentences.  PSYCH: Pleasant and cooperative. Speech normal rate and rhythm. Affect is appropriate. Insight and judgement are appropriate. Attention is focused, linear, and appropriate.  NEURO: Oriented as arrived to appointment  on time with no prompting.   Lab Results  Component Value Date   CREATININE 0.82 08/22/2019   BUN 13 08/22/2019   NA 140 08/22/2019   K 4.6 08/22/2019   CL 103 08/22/2019   CO2  24 08/22/2019   Lab Results  Component Value Date   ALT 18 08/22/2019   AST 13 08/22/2019   ALKPHOS 116 08/22/2019   BILITOT 0.4 08/22/2019   Lab Results  Component Value Date   HGBA1C 5.9 (H) 08/22/2019   HGBA1C 5.8 (H) 03/11/2019   HGBA1C 7.1 (H) 09/13/2018   HGBA1C 7.7 (H) 04/29/2018   Lab Results  Component Value Date   INSULIN 13.8 08/22/2019   INSULIN 7.6 03/11/2019   INSULIN 15.9 09/13/2018   INSULIN 18.4 04/29/2018   Lab Results  Component Value Date   TSH 0.770 04/29/2018   Lab Results  Component Value Date   CHOL 183 08/22/2019   HDL 66 08/22/2019   LDLCALC 103 (H) 08/22/2019   TRIG 73 08/22/2019   Lab Results  Component Value Date   WBC 6.8 04/29/2018   HGB 13.7 04/29/2018   HCT 42.3 04/29/2018   MCV 95 04/29/2018   PLT 305 10/23/2016   Lab Results  Component Value Date   IRON 156 (H) 09/23/2013   FERRITIN 103.5 09/23/2013   Attestation Statements:   Reviewed by clinician on day of visit: allergies, medications, problem list, medical history, surgical history, family history, social history, and previous encounter notes.  IMarianna Payment, am acting as transcriptionist for Alois Cliche, PA-C   I have reviewed the above documentation for accuracy and completeness, and I agree with the above. Alois Cliche, PA-C

## 2019-10-30 ENCOUNTER — Encounter: Payer: Self-pay | Admitting: Family Medicine

## 2019-10-30 DIAGNOSIS — F988 Other specified behavioral and emotional disorders with onset usually occurring in childhood and adolescence: Secondary | ICD-10-CM

## 2019-10-30 DIAGNOSIS — F909 Attention-deficit hyperactivity disorder, unspecified type: Secondary | ICD-10-CM

## 2019-10-31 MED ORDER — LISDEXAMFETAMINE DIMESYLATE 50 MG PO CAPS: 50.0000 mg | ORAL_CAPSULE | Freq: Every day | ORAL | 0 refills | Status: DC

## 2019-10-31 NOTE — Telephone Encounter (Signed)
Requesting: [place] Contract: 08/05/18 UDS: 08/05/18 Last OV: 07/24/19 Next OV: N/A Last Refill: 10/2019, #30--0 RF Database:   Please advise

## 2019-11-01 ENCOUNTER — Other Ambulatory Visit (INDEPENDENT_AMBULATORY_CARE_PROVIDER_SITE_OTHER): Payer: Self-pay | Admitting: Physician Assistant

## 2019-11-01 DIAGNOSIS — E7849 Other hyperlipidemia: Secondary | ICD-10-CM

## 2019-11-08 ENCOUNTER — Encounter (INDEPENDENT_AMBULATORY_CARE_PROVIDER_SITE_OTHER): Payer: Self-pay | Admitting: Physician Assistant

## 2019-11-08 ENCOUNTER — Other Ambulatory Visit: Payer: Self-pay

## 2019-11-08 ENCOUNTER — Ambulatory Visit (INDEPENDENT_AMBULATORY_CARE_PROVIDER_SITE_OTHER): Payer: BC Managed Care – PPO | Admitting: Physician Assistant

## 2019-11-08 VITALS — BP 109/73 | HR 80 | Temp 97.9°F | Ht 65.0 in | Wt 159.0 lb

## 2019-11-08 DIAGNOSIS — E119 Type 2 diabetes mellitus without complications: Secondary | ICD-10-CM | POA: Diagnosis not present

## 2019-11-08 DIAGNOSIS — Z9189 Other specified personal risk factors, not elsewhere classified: Secondary | ICD-10-CM

## 2019-11-08 DIAGNOSIS — E559 Vitamin D deficiency, unspecified: Secondary | ICD-10-CM

## 2019-11-08 DIAGNOSIS — F3289 Other specified depressive episodes: Secondary | ICD-10-CM

## 2019-11-08 DIAGNOSIS — E669 Obesity, unspecified: Secondary | ICD-10-CM

## 2019-11-08 DIAGNOSIS — E7849 Other hyperlipidemia: Secondary | ICD-10-CM

## 2019-11-08 DIAGNOSIS — Z6830 Body mass index (BMI) 30.0-30.9, adult: Secondary | ICD-10-CM

## 2019-11-08 MED ORDER — ATORVASTATIN CALCIUM 10 MG PO TABS: 10.0000 mg | ORAL_TABLET | Freq: Every day | ORAL | 0 refills | Status: DC

## 2019-11-08 MED ORDER — BUPROPION HCL ER (SR) 200 MG PO TB12: 200.0000 mg | ORAL_TABLET | Freq: Every day | ORAL | 0 refills | Status: DC

## 2019-11-08 MED ORDER — ONETOUCH ULTRA VI STRP: ORAL_STRIP | 0 refills | Status: DC

## 2019-11-08 MED ORDER — ONETOUCH DELICA LANCETS 30G MISC: 1.0000 | Freq: Every day | 0 refills | Status: DC

## 2019-11-08 MED ORDER — JARDIANCE 25 MG PO TABS: 25.0000 mg | ORAL_TABLET | Freq: Every day | ORAL | 0 refills | Status: DC

## 2019-11-08 MED ORDER — VITAMIN D (ERGOCALCIFEROL) 1.25 MG (50000 UNIT) PO CAPS: 50000.0000 [IU] | ORAL_CAPSULE | ORAL | 0 refills | Status: DC

## 2019-11-08 MED ORDER — METFORMIN HCL 1000 MG PO TABS: 1000.0000 mg | ORAL_TABLET | Freq: Two times a day (BID) | ORAL | 0 refills | Status: DC

## 2019-11-08 NOTE — Progress Notes (Signed)
Chief Complaint:   OBESITY Jillian Green is here to discuss her progress with her obesity treatment plan along with follow-up of her obesity related diagnoses. Jillian Green is on the Category 3 Plan and states she is following her eating plan approximately 70% of the time. Jillian Green states she is walking for 30 minutes 5 times per week.  Today's visit was #: 25 Starting weight: 184 lbs Starting date: 04/29/2018 Today's weight: 159 lbs Today's date: 11/08/2019 Total lbs lost to date: 25 lbs Total lbs lost since last in-office visit: 6 lbs  Interim History: Jillian Green has done very well on the plan.  She notes an increase in sweet cravings, but has been stressed.  Subjective:   1. Other hyperlipidemia Zinia has hyperlipidemia and has been trying to improve her cholesterol levels with intensive lifestyle modification including a low saturated fat diet, exercise and weight loss. She denies any chest pain, claudication or myalgias.  She is taking atorvastatin.  Lab Results  Component Value Date   ALT 18 08/22/2019   AST 13 08/22/2019   ALKPHOS 116 08/22/2019   BILITOT 0.4 08/22/2019   Lab Results  Component Value Date   CHOL 183 08/22/2019   HDL 66 08/22/2019   LDLCALC 103 (H) 08/22/2019   TRIG 73 08/22/2019   2. Vitamin D deficiency Jillian Green's Vitamin D level was 60.4 on 08/22/2019. She is currently taking prescription vitamin D 50,000 IU every 2 weeks. She denies nausea, vomiting or muscle weakness.    3. Type 2 diabetes mellitus without complication, without long-term current use of insulin (HCC) Medications reviewed.  Fasting blood sugars 102-120.  No hypoglycemia.  No polyphagia.  Lab Results  Component Value Date   HGBA1C 5.9 (H) 08/22/2019   HGBA1C 5.8 (H) 03/11/2019   HGBA1C 7.1 (H) 09/13/2018   Lab Results  Component Value Date   LDLCALC 103 (H) 08/22/2019   CREATININE 0.82 08/22/2019   Lab Results  Component Value Date   INSULIN 13.8 08/22/2019   INSULIN 7.6 03/11/2019   INSULIN  15.9 09/13/2018   INSULIN 18.4 04/29/2018   4. Other depression, with emotional eating  No SI/HI.  Mood is down when she gets stressed.  She notes increased cravings for sweets.  5. At risk for hypoglycemia Keir is at increased risk for hypoglycemia due to changes in diet, diagnosis of diabetes, and/or insulin use. Jillian Green is not currently taking insulin.   Assessment/Plan:   1. Other hyperlipidemia Cardiovascular risk and specific lipid/LDL goals reviewed.  We discussed several lifestyle modifications today and Jillian Green will continue to work on diet, exercise and weight loss efforts. Orders and follow up as documented in patient record.   Counseling Intensive lifestyle modifications are the first line treatment for this issue. . Dietary changes: Increase soluble fiber. Decrease simple carbohydrates. . Exercise changes: Moderate to vigorous-intensity aerobic activity 150 minutes per week if tolerated. . Lipid-lowering medications: see documented in medical record. - atorvastatin (LIPITOR) 10 MG tablet; Take 1 tablet (10 mg total) by mouth daily.  Dispense: 90 tablet; Refill: 0  2. Vitamin D deficiency Low Vitamin D level contributes to fatigue and are associated with obesity, breast, and colon cancer. She agrees to continue to take prescription Vitamin D @50 ,000 IU every 2 weeks and will follow-up for routine testing of Vitamin D, at least 2-3 times per year to avoid over-replacement. - Vitamin D, Ergocalciferol, (DRISDOL) 1.25 MG (50000 UNIT) CAPS capsule; Take 1 capsule (50,000 Units total) by mouth every 14 (fourteen) days.  Dispense: 4 capsule; Refill: 0  3. Type 2 diabetes mellitus without complication, without long-term current use of insulin (HCC) Good blood sugar control is important to decrease the likelihood of diabetic complications such as nephropathy, neuropathy, limb loss, blindness, coronary artery disease, and death. Intensive lifestyle modification including diet, exercise and  weight loss are the first line of treatment for diabetes.  - glucose blood (ONETOUCH ULTRA) test strip; USE AS INSTRUCTED  Dispense: 100 strip; Refill: 0 - OneTouch Delica Lancets 30G MISC; 1 Device by Does not apply route daily.  Dispense: 100 each; Refill: 0 - metFORMIN (GLUCOPHAGE) 1000 MG tablet; Take 1 tablet (1,000 mg total) by mouth 2 (two) times daily with a meal.  Dispense: 180 tablet; Refill: 0 - empagliflozin (JARDIANCE) 25 MG TABS tablet; Take 25 mg by mouth daily.  Dispense: 90 tablet; Refill: 0  4. Other depression, with emotional eating  Behavior modification techniques were discussed today to help Jillian Green deal with her emotional/non-hunger eating behaviors.  Orders and follow up as documented in patient record.  - buPROPion (WELLBUTRIN SR) 200 MG 12 hr tablet; Take 1 tablet (200 mg total) by mouth daily.  Dispense: 60 tablet; Refill: 0  5. At risk for hypoglycemia Jillian Green was given approximately 15 minutes of counseling today regarding prevention of hypoglycemia. She was advised of symptoms of hypoglycemia. Jillian Green was instructed to avoid skipping meals, eat regular protein rich meals and schedule low calorie snacks as needed.   Repetitive spaced learning was employed today to elicit superior memory formation and behavioral change  6. Class 1 obesity with serious comorbidity and body mass index (BMI) of 30.0 to 30.9 in adult, unspecified obesity type Jillian Green is currently in the action stage of change. As such, her goal is to continue with weight loss efforts. She has agreed to the Category 3 Plan.   Exercise goals: As is.  Behavioral modification strategies: meal planning and cooking strategies and planning for success.  Jillian Green has agreed to follow-up with our clinic in 8 weeks. She was informed of the importance of frequent follow-up visits to maximize her success with intensive lifestyle modifications for her multiple health conditions.   Objective:   Blood pressure 109/73, pulse  80, temperature 97.9 F (36.6 C), temperature source Oral, height 5\' 5"  (1.651 m), weight 159 lb (72.1 kg), SpO2 97 %. Body mass index is 26.46 kg/m.  General: Cooperative, alert, well developed, in no acute distress. HEENT: Conjunctivae and lids unremarkable. Cardiovascular: Regular rhythm.  Lungs: Normal work of breathing. Neurologic: No focal deficits.   Lab Results  Component Value Date   CREATININE 0.82 08/22/2019   BUN 13 08/22/2019   NA 140 08/22/2019   K 4.6 08/22/2019   CL 103 08/22/2019   CO2 24 08/22/2019   Lab Results  Component Value Date   ALT 18 08/22/2019   AST 13 08/22/2019   ALKPHOS 116 08/22/2019   BILITOT 0.4 08/22/2019   Lab Results  Component Value Date   HGBA1C 5.9 (H) 08/22/2019   HGBA1C 5.8 (H) 03/11/2019   HGBA1C 7.1 (H) 09/13/2018   HGBA1C 7.7 (H) 04/29/2018   Lab Results  Component Value Date   INSULIN 13.8 08/22/2019   INSULIN 7.6 03/11/2019   INSULIN 15.9 09/13/2018   INSULIN 18.4 04/29/2018   Lab Results  Component Value Date   TSH 0.770 04/29/2018   Lab Results  Component Value Date   CHOL 183 08/22/2019   HDL 66 08/22/2019   LDLCALC 103 (H) 08/22/2019  TRIG 73 08/22/2019   Lab Results  Component Value Date   WBC 6.8 04/29/2018   HGB 13.7 04/29/2018   HCT 42.3 04/29/2018   MCV 95 04/29/2018   PLT 305 10/23/2016   Lab Results  Component Value Date   IRON 156 (H) 09/23/2013   FERRITIN 103.5 09/23/2013   Attestation Statements:   Reviewed by clinician on day of visit: allergies, medications, problem list, medical history, surgical history, family history, social history, and previous encounter notes.  I, Water quality scientist, CMA, am acting as Location manager for [employer], PA-C.  I have reviewed the above documentation for accuracy and completeness, and I agree with the above. Abby Potash, PA-C

## 2019-11-23 ENCOUNTER — Other Ambulatory Visit: Payer: Self-pay | Admitting: Family Medicine

## 2019-11-23 DIAGNOSIS — M797 Fibromyalgia: Secondary | ICD-10-CM

## 2019-11-30 ENCOUNTER — Encounter: Payer: Self-pay | Admitting: Family Medicine

## 2019-12-12 ENCOUNTER — Encounter: Payer: Self-pay | Admitting: Family Medicine

## 2019-12-12 ENCOUNTER — Encounter (INDEPENDENT_AMBULATORY_CARE_PROVIDER_SITE_OTHER): Payer: Self-pay | Admitting: Physician Assistant

## 2019-12-12 NOTE — Telephone Encounter (Signed)
Pt has sent messages prior to this one about the is concern. I will print the forms out.

## 2019-12-13 NOTE — Telephone Encounter (Signed)
Did you print it out--- I did not see it

## 2019-12-13 NOTE — Telephone Encounter (Signed)
Form printed and given to [employer]

## 2019-12-16 ENCOUNTER — Encounter: Payer: Self-pay | Admitting: Family Medicine

## 2019-12-16 NOTE — Telephone Encounter (Signed)
Pt is calling back wanting to know how the form was completed.  Please giver her a call back at [phone]

## 2019-12-20 NOTE — Telephone Encounter (Signed)
I did the form last week

## 2019-12-30 ENCOUNTER — Other Ambulatory Visit (INDEPENDENT_AMBULATORY_CARE_PROVIDER_SITE_OTHER): Payer: Self-pay | Admitting: Physician Assistant

## 2019-12-30 DIAGNOSIS — E559 Vitamin D deficiency, unspecified: Secondary | ICD-10-CM

## 2020-01-01 ENCOUNTER — Other Ambulatory Visit (INDEPENDENT_AMBULATORY_CARE_PROVIDER_SITE_OTHER): Payer: Self-pay | Admitting: Physician Assistant

## 2020-01-01 DIAGNOSIS — F3289 Other specified depressive episodes: Secondary | ICD-10-CM

## 2020-01-03 ENCOUNTER — Encounter (INDEPENDENT_AMBULATORY_CARE_PROVIDER_SITE_OTHER): Payer: Self-pay

## 2020-01-09 ENCOUNTER — Ambulatory Visit (INDEPENDENT_AMBULATORY_CARE_PROVIDER_SITE_OTHER): Payer: BC Managed Care – PPO | Admitting: Physician Assistant

## 2020-01-16 ENCOUNTER — Encounter (INDEPENDENT_AMBULATORY_CARE_PROVIDER_SITE_OTHER): Payer: Self-pay | Admitting: Physician Assistant

## 2020-01-17 ENCOUNTER — Other Ambulatory Visit (INDEPENDENT_AMBULATORY_CARE_PROVIDER_SITE_OTHER): Payer: Self-pay | Admitting: *Deleted

## 2020-01-17 DIAGNOSIS — F3289 Other specified depressive episodes: Secondary | ICD-10-CM

## 2020-01-17 MED ORDER — BUPROPION HCL ER (SR) 200 MG PO TB12: 200.0000 mg | ORAL_TABLET | Freq: Every day | ORAL | 0 refills | Status: DC

## 2020-01-23 ENCOUNTER — Ambulatory Visit (INDEPENDENT_AMBULATORY_CARE_PROVIDER_SITE_OTHER): Payer: BC Managed Care – PPO | Admitting: Physician Assistant

## 2020-01-23 ENCOUNTER — Encounter (INDEPENDENT_AMBULATORY_CARE_PROVIDER_SITE_OTHER): Payer: Self-pay | Admitting: Physician Assistant

## 2020-01-23 ENCOUNTER — Other Ambulatory Visit: Payer: Self-pay

## 2020-01-23 VITALS — BP 109/77 | HR 82 | Temp 98.0°F | Ht 65.0 in | Wt 164.0 lb

## 2020-01-23 DIAGNOSIS — E785 Hyperlipidemia, unspecified: Secondary | ICD-10-CM

## 2020-01-23 DIAGNOSIS — Z9189 Other specified personal risk factors, not elsewhere classified: Secondary | ICD-10-CM | POA: Diagnosis not present

## 2020-01-23 DIAGNOSIS — E1169 Type 2 diabetes mellitus with other specified complication: Secondary | ICD-10-CM | POA: Diagnosis not present

## 2020-01-23 DIAGNOSIS — E559 Vitamin D deficiency, unspecified: Secondary | ICD-10-CM | POA: Diagnosis not present

## 2020-01-23 DIAGNOSIS — E669 Obesity, unspecified: Secondary | ICD-10-CM

## 2020-01-23 DIAGNOSIS — Z6830 Body mass index (BMI) 30.0-30.9, adult: Secondary | ICD-10-CM

## 2020-01-23 MED ORDER — EMPAGLIFLOZIN 25 MG PO TABS: 25.0000 mg | ORAL_TABLET | Freq: Every day | ORAL | 0 refills | Status: DC

## 2020-01-23 MED ORDER — VITAMIN D (ERGOCALCIFEROL) 1.25 MG (50000 UNIT) PO CAPS: 50000.0000 [IU] | ORAL_CAPSULE | ORAL | 0 refills | Status: DC

## 2020-01-23 MED ORDER — ATORVASTATIN CALCIUM 10 MG PO TABS: 10.0000 mg | ORAL_TABLET | Freq: Every day | ORAL | 0 refills | Status: DC

## 2020-01-23 NOTE — Progress Notes (Signed)
Chief Complaint:   OBESITY Jillian Green is here to discuss her progress with her obesity treatment plan along with follow-up of her obesity related diagnoses. Jillian Green is on the Category 3 Plan and states she is following her eating plan approximately 60% of the time. Jillian Green states she is exercising 0 minutes 0 times per week.  Today's visit was #: 26 Starting weight: 184 lbs Starting date: 04/29/2018 Today's weight: 164 lbs Today's date: 01/23/2020 Total lbs lost to date: 20 Total lbs lost since last in-office visit: 0  Interim History: Jillian Green reports a lot of stress recently. She doesn't want to eat healthy things for dinner. She states she is in the process of moving.  Subjective:   Type 2 diabetes mellitus with hyperlipidemia (HCC). Fasting blood sugars range between 130 and 170, higher than normal. She is on Jardiance and metformin. No nausea, vomiting, or diarrhea. No UTI's.  Lab Results  Component Value Date   HGBA1C 5.9 (H) 08/22/2019   HGBA1C 5.8 (H) 03/11/2019   HGBA1C 7.1 (H) 09/13/2018   Lab Results  Component Value Date   LDLCALC 103 (H) 08/22/2019   CREATININE 0.82 08/22/2019   Lab Results  Component Value Date   INSULIN 13.8 08/22/2019   INSULIN 7.6 03/11/2019   INSULIN 15.9 09/13/2018   INSULIN 18.4 04/29/2018   Vitamin D deficiency. Jillian Green is on prescription Vitamin D. No nausea, vomiting, or muscle weakness. Last Vitamin D was 60.4 on 08/22/2019.  At risk for heart disease. Jillian Green is at a higher than average risk for cardiovascular disease due to obesity.   Assessment/Plan:   Type 2 diabetes mellitus with hyperlipidemia (HCC). Good blood sugar control is important to decrease the likelihood of diabetic complications such as nephropathy, neuropathy, limb loss, blindness, coronary artery disease, and death. Intensive lifestyle modification including diet, exercise and weight loss are the first line of treatment for diabetes. Refills were given for  empagliflozin (JARDIANCE) 25 MG TABS tablet #90 with 0 refills and atorvastatin (LIPITOR) 10 MG tablet #90 with 0 refills. Comprehensive metabolic panel, Hemoglobin A1c, Insulin, random, Lipid Panel With LDL/HDL Ratio labs ordered today.  Vitamin D deficiency. Low Vitamin D level contributes to fatigue and are associated with obesity, breast, and colon cancer. She was given a refill on her Vitamin D, Ergocalciferol, (DRISDOL) 1.25 MG (50000 UNIT) CAPS capsule every 14 days #6 with 0 refills and VITAMIN D 25 Hydroxy (Vit-D Deficiency, Fractures) level was ordered today.  At risk for heart disease. Jillian Green was given approximately 15 minutes of coronary artery disease prevention counseling today. She is 48 y.o. female and has risk factors for heart disease including obesity. We discussed intensive lifestyle modifications today with an emphasis on specific weight loss instructions and strategies.   Repetitive spaced learning was employed today to elicit superior memory formation and behavioral change.  Class 1 obesity with serious comorbidity and body mass index (BMI) of 30.0 to 30.9 in adult, unspecified obesity type - STarting BMI greater than 30.  Jillian Green is currently in the action stage of change. As such, her goal is to continue with weight loss efforts. She has agreed to keeping a food journal and adhering to recommended goals of 1500 calories and 95 grams of protein daily.   Exercise goals: For substantial health benefits, adults should do at least 150 minutes (2 hours and 30 minutes) a week of moderate-intensity, or 75 minutes (1 hour and 15 minutes) a week of vigorous-intensity aerobic physical activity, or an  equivalent combination of moderate- and vigorous-intensity aerobic activity. Aerobic activity should be performed in episodes of at least 10 minutes, and preferably, it should be spread throughout the week.  Behavioral modification strategies: decreasing eating out and meal planning and cooking  strategies.  Jillian Green has agreed to follow-up with our clinic in 3-4 weeks. She was informed of the importance of frequent follow-up visits to maximize her success with intensive lifestyle modifications for her multiple health conditions.   Jillian Green was informed we would discuss her lab results at her next visit unless there is a critical issue that needs to be addressed sooner. Jillian Green agreed to keep her next visit at the agreed upon time to discuss these results.  Objective:   Blood pressure 109/77, pulse 82, temperature 98 F (36.7 C), temperature source Oral, height 5\' 5"  (1.651 m), weight 164 lb (74.4 kg), SpO2 100 %. Body mass index is 27.29 kg/m.  General: Cooperative, alert, well developed, in no acute distress. HEENT: Conjunctivae and lids unremarkable. Cardiovascular: Regular rhythm.  Lungs: Normal work of breathing. Neurologic: No focal deficits.   Lab Results  Component Value Date   CREATININE 0.82 08/22/2019   BUN 13 08/22/2019   NA 140 08/22/2019   K 4.6 08/22/2019   CL 103 08/22/2019   CO2 24 08/22/2019   Lab Results  Component Value Date   ALT 18 08/22/2019   AST 13 08/22/2019   ALKPHOS 116 08/22/2019   BILITOT 0.4 08/22/2019   Lab Results  Component Value Date   HGBA1C 5.9 (H) 08/22/2019   HGBA1C 5.8 (H) 03/11/2019   HGBA1C 7.1 (H) 09/13/2018   HGBA1C 7.7 (H) 04/29/2018   Lab Results  Component Value Date   INSULIN 13.8 08/22/2019   INSULIN 7.6 03/11/2019   INSULIN 15.9 09/13/2018   INSULIN 18.4 04/29/2018   Lab Results  Component Value Date   TSH 0.770 04/29/2018   Lab Results  Component Value Date   CHOL 183 08/22/2019   HDL 66 08/22/2019   LDLCALC 103 (H) 08/22/2019   TRIG 73 08/22/2019   Lab Results  Component Value Date   WBC 6.8 04/29/2018   HGB 13.7 04/29/2018   HCT 42.3 04/29/2018   MCV 95 04/29/2018   PLT 305 10/23/2016   Lab Results  Component Value Date   IRON 156 (H) 09/23/2013   FERRITIN 103.5 09/23/2013   Attestation  Statements:   Reviewed by clinician on day of visit: allergies, medications, problem list, medical history, surgical history, family history, social history, and previous encounter notes.  IMichaelene Song, am acting as transcriptionist for Abby Potash, PA-C   I have reviewed the above documentation for accuracy and completeness, and I agree with the above. Abby Potash, PA-C

## 2020-02-07 ENCOUNTER — Encounter: Payer: Self-pay | Admitting: Family Medicine

## 2020-02-07 DIAGNOSIS — F988 Other specified behavioral and emotional disorders with onset usually occurring in childhood and adolescence: Secondary | ICD-10-CM

## 2020-02-07 MED ORDER — LISDEXAMFETAMINE DIMESYLATE 50 MG PO CAPS: 50.0000 mg | ORAL_CAPSULE | Freq: Every day | ORAL | 0 refills | Status: DC

## 2020-02-07 NOTE — Telephone Encounter (Signed)
Vyvanse refill.   Last OV: 08/03/2019 Last Fill: 10/31/2019 #30 and 0RF (For April, May, June 2021) Pt sig: 1 capsule daily UDS: 08/05/2018 Low risk

## 2020-02-18 LAB — LIPID PANEL WITH LDL/HDL RATIO
Cholesterol, Total: 155 mg/dL (ref 100–199)
HDL: 60 mg/dL (ref >39)
LDL Chol Calc (NIH): 81 mg/dL (ref 0–99)
LDL/HDL Ratio: 1.4 ratio (ref 0.0–3.2)
Triglycerides: 70 mg/dL (ref 0–149)
VLDL Cholesterol Cal: 14 mg/dL (ref 5–40)

## 2020-02-18 LAB — COMPREHENSIVE METABOLIC PANEL
ALT: 20 IU/L (ref 0–32)
AST: 16 IU/L (ref 0–40)
Albumin/Globulin Ratio: 2.2 (ref 1.2–2.2)
Albumin: 4.6 g/dL (ref 3.8–4.8)
Alkaline Phosphatase: 117 IU/L (ref 48–121)
BUN/Creatinine Ratio: 15 (ref 9–23)
BUN: 12 mg/dL (ref 6–24)
Bilirubin Total: 0.4 mg/dL (ref 0.0–1.2)
CO2: 25 mmol/L (ref 20–29)
Calcium: 9.5 mg/dL (ref 8.7–10.2)
Chloride: 103 mmol/L (ref 96–106)
Creatinine, Ser: 0.78 mg/dL (ref 0.57–1.00)
GFR calc Af Amer: 105 mL/min/{1.73_m2} (ref >59)
GFR calc non Af Amer: 91 mL/min/{1.73_m2} (ref >59)
Globulin, Total: 2.1 g/dL (ref 1.5–4.5)
Glucose: 109 mg/dL — ABNORMAL HIGH (ref 65–99)
Potassium: 4.4 mmol/L (ref 3.5–5.2)
Sodium: 141 mmol/L (ref 134–144)
Total Protein: 6.7 g/dL (ref 6.0–8.5)

## 2020-02-18 LAB — VITAMIN D 25 HYDROXY (VIT D DEFICIENCY, FRACTURES): Vit D, 25-Hydroxy: 58.8 ng/mL (ref 30.0–100.0)

## 2020-02-18 LAB — HEMOGLOBIN A1C
Est. average glucose Bld gHb Est-mCnc: 126 mg/dL
Hgb A1c MFr Bld: 6 % — ABNORMAL HIGH (ref 4.8–5.6)

## 2020-02-18 LAB — INSULIN, RANDOM: INSULIN: 9.9 u[IU]/mL (ref 2.6–24.9)

## 2020-02-23 ENCOUNTER — Ambulatory Visit: Payer: BC Managed Care – PPO | Admitting: Family Medicine

## 2020-02-23 ENCOUNTER — Ambulatory Visit (INDEPENDENT_AMBULATORY_CARE_PROVIDER_SITE_OTHER): Payer: BC Managed Care – PPO | Admitting: Physician Assistant

## 2020-02-23 ENCOUNTER — Encounter: Payer: Self-pay | Admitting: Family Medicine

## 2020-02-23 ENCOUNTER — Other Ambulatory Visit: Payer: Self-pay

## 2020-02-23 VITALS — BP 118/72 | HR 104 | Temp 98.4°F | Resp 18 | Ht 65.0 in | Wt 169.4 lb

## 2020-02-23 DIAGNOSIS — J302 Other seasonal allergic rhinitis: Secondary | ICD-10-CM

## 2020-02-23 DIAGNOSIS — G43009 Migraine without aura, not intractable, without status migrainosus: Secondary | ICD-10-CM | POA: Diagnosis not present

## 2020-02-23 DIAGNOSIS — F988 Other specified behavioral and emotional disorders with onset usually occurring in childhood and adolescence: Secondary | ICD-10-CM

## 2020-02-23 DIAGNOSIS — J019 Acute sinusitis, unspecified: Secondary | ICD-10-CM

## 2020-02-23 DIAGNOSIS — F419 Anxiety disorder, unspecified: Secondary | ICD-10-CM | POA: Diagnosis not present

## 2020-02-23 DIAGNOSIS — Z79899 Other long term (current) drug therapy: Secondary | ICD-10-CM | POA: Diagnosis not present

## 2020-02-23 MED ORDER — LISDEXAMFETAMINE DIMESYLATE 50 MG PO CAPS: 50.0000 mg | ORAL_CAPSULE | Freq: Every day | ORAL | 0 refills | Status: DC

## 2020-02-23 MED ORDER — HYDROXYZINE HCL 25 MG PO TABS: ORAL_TABLET | ORAL | 1 refills | Status: DC

## 2020-02-23 MED ORDER — ALPRAZOLAM 0.5 MG PO TABS: 0.5000 mg | ORAL_TABLET | Freq: Three times a day (TID) | ORAL | 1 refills | Status: DC | PRN

## 2020-02-23 MED ORDER — AZELASTINE HCL 0.05 % OP SOLN: 1.0000 [drp] | Freq: Two times a day (BID) | OPHTHALMIC | 1 refills | Status: DC | PRN

## 2020-02-23 MED ORDER — FLUTICASONE PROPIONATE 50 MCG/ACT NA SUSP: 2.0000 | Freq: Every day | NASAL | 6 refills | Status: DC

## 2020-02-23 MED ORDER — RIZATRIPTAN BENZOATE 10 MG PO TBDP: 10.0000 mg | ORAL_TABLET | ORAL | 0 refills | Status: DC | PRN

## 2020-02-23 NOTE — Patient Instructions (Signed)

## 2020-02-23 NOTE — Progress Notes (Signed)
Patient ID: Jillian Green, female    DOB: 10-08-1970  Age: 49 y.o. MRN: [id number]    Subjective:  Subjective  HPI Tykiera L Gatliff presents for f/u add, anxiety and migraines.   No complaints   Review of Systems  Constitutional: Negative for appetite change, diaphoresis, fatigue and unexpected weight change.  Eyes: Negative for pain, redness and visual disturbance.  Respiratory: Negative for cough, chest tightness, shortness of breath and wheezing.   Cardiovascular: Negative for chest pain, palpitations and leg swelling.  Endocrine: Negative for cold intolerance, heat intolerance, polydipsia, polyphagia and polyuria.  Genitourinary: Negative for difficulty urinating, dysuria and frequency.  Neurological: Negative for dizziness, light-headedness, numbness and headaches.    History Past Medical History:  Diagnosis Date  . ADD (attention deficit disorder)   . Anxiety   . Back pain   . Chronic headaches   . Depression   . Fibromyalgia   . GERD (gastroesophageal reflux disease)   . Itchy skin   . Joint pain   . Lactose intolerance   . Migraine   . Sweating increase   . Type 2 diabetes mellitus without complication, without long-term current use of insulin ([place]) 09/13/2018  . Unspecified eustachian tube disorder     She has a past surgical history that includes Sinus exploration; Cesarean section; and OTHER SURGICAL HISTORY.   Her family history includes ADD / ADHD in her sister; Alcoholism in her father; Anxiety disorder in her mother; Heart disease in her father; Hypertension in her father and mother; Obesity in her mother; Other in her father; Sleep apnea in her mother; Thyroid disease in her mother.She reports that she quit smoking about 13 years ago. She quit after 8.00 years of use. She has never used smokeless tobacco. She reports current alcohol use of about 1.0 standard drink of alcohol per week. She reports that she does not use drugs.  Current Outpatient  Medications on File Prior to Visit  Medication Sig Dispense Refill  . atorvastatin (LIPITOR) 10 MG tablet Take 1 tablet (10 mg total) by mouth daily. 90 tablet 0  . BD PEN NEEDLE NANO U/F 32G X 4 MM MISC USE TWICE A DAY 100 each 0  . blood glucose meter kit and supplies Dispense based on patient and insurance preference. Use up to four times daily as directed. (FOR ICD-10 E10.9, E11.9). 1 each 0  . buPROPion (WELLBUTRIN SR) 200 MG 12 hr tablet Take 1 tablet (200 mg total) by mouth daily. 60 tablet 0  . DULoxetine (CYMBALTA) 60 MG capsule TAKE 1 CAPSULE (60 MG TOTAL) BY MOUTH 2 (TWO) TIMES DAILY. 180 capsule 1  . empagliflozin (JARDIANCE) 25 MG TABS tablet Take 1 tablet (25 mg total) by mouth daily. 90 tablet 0  . glucose blood (ONETOUCH ULTRA) test strip USE AS INSTRUCTED 100 strip 0  . levocetirizine (XYZAL) 5 MG tablet TAKE 1 TABLET BY MOUTH EVERY DAY IN THE EVENING 90 tablet 0  . metFORMIN (GLUCOPHAGE) 1000 MG tablet Take 1 tablet (1,000 mg total) by mouth 2 (two) times daily with a meal. 180 tablet 0  . metroNIDAZOLE (METROGEL) 1 % gel Apply topically daily. 45 g 0  . NONFORMULARY OR COMPOUNDED ITEM Tiger balm patches 1 patch daily prn 30 each 5  . omeprazole (PRILOSEC) 20 MG capsule Take 1 capsule (20 mg total) by mouth daily. 90 capsule 0  . OneTouch Delica Lancets 58N MISC 1 Device by Does not apply route daily. 100 each 0  . Vitamin D,  Ergocalciferol, (DRISDOL) 1.25 MG (50000 UNIT) CAPS capsule Take 1 capsule (50,000 Units total) by mouth every 14 (fourteen) days. 4 capsule 0   No current facility-administered medications on file prior to visit.     Objective:  Objective  Physical Exam Vitals and nursing note reviewed.  Constitutional:      Appearance: She is well-developed.  HENT:     Head: Normocephalic and atraumatic.  Eyes:     Conjunctiva/sclera: Conjunctivae normal.  Neck:     Thyroid: No thyromegaly.     Vascular: No carotid bruit or JVD.  Cardiovascular:     Rate and  Rhythm: Normal rate and regular rhythm.     Heart sounds: Normal heart sounds. No murmur heard.   Pulmonary:     Effort: Pulmonary effort is normal. No respiratory distress.     Breath sounds: Normal breath sounds. No wheezing or rales.  Chest:     Chest wall: No tenderness.  Musculoskeletal:     Cervical back: Normal range of motion and neck supple.  Neurological:     Mental Status: She is alert and oriented to person, place, and time.    BP 118/72 (BP Location: Right Arm, Patient Position: Sitting, Cuff Size: Normal)   Pulse 104   Temp 98.4 F (36.9 C) (Oral)   Resp 18   Ht 5' 5"  (1.651 m)   Wt 169 lb 6.4 oz (76.8 kg)   SpO2 98%   BMI 28.19 kg/m  Wt Readings from Last 3 Encounters:  02/23/20 169 lb 6.4 oz (76.8 kg)  01/23/20 164 lb (74.4 kg)  11/08/19 159 lb (72.1 kg)     Lab Results  Component Value Date   WBC 6.8 04/29/2018   HGB 13.7 04/29/2018   HCT 42.3 04/29/2018   PLT 305 10/23/2016   GLUCOSE 109 (H) 02/17/2020   CHOL 155 02/17/2020   TRIG 70 02/17/2020   HDL 60 02/17/2020   LDLCALC 81 02/17/2020   ALT 20 02/17/2020   AST 16 02/17/2020   NA 141 02/17/2020   K 4.4 02/17/2020   CL 103 02/17/2020   CREATININE 0.78 02/17/2020   BUN 12 02/17/2020   CO2 25 02/17/2020   TSH 0.770 04/29/2018   HGBA1C 6.0 (H) 02/17/2020    MM 3D SCREEN BREAST BILATERAL  Result Date: 08/05/2018 CLINICAL DATA:  Screening. EXAM: DIGITAL SCREENING BILATERAL MAMMOGRAM WITH TOMO AND CAD COMPARISON:  Previous exam(s). ACR Breast Density Category b: There are scattered areas of fibroglandular density. FINDINGS: There are no findings suspicious for malignancy. Images were processed with CAD. IMPRESSION: No mammographic evidence of malignancy. A result letter of this screening mammogram will be mailed directly to the patient. RECOMMENDATION: Screening mammogram in one year. (Code:SM-B-01Y) BI-RADS CATEGORY  1: Negative. Electronically Signed   By: Claudie Revering M.D.   On: 08/05/2018 15:00       Assessment & Plan:  Plan  I am having Suzan L. Bosch start on lisdexamfetamine, lisdexamfetamine, and fluticasone. I am also having her maintain her omeprazole, metroNIDAZOLE, BD Pen Needle Nano U/F, NONFORMULARY OR COMPOUNDED ITEM, levocetirizine, blood glucose meter kit and supplies, OneTouch Ultra, OneTouch Delica Lancets 08M, metFORMIN, DULoxetine, buPROPion, empagliflozin, atorvastatin, Vitamin D (Ergocalciferol), ALPRAZolam, lisdexamfetamine, hydrOXYzine, azelastine, and rizatriptan.  Meds ordered this encounter  Medications  . ALPRAZolam (XANAX) 0.5 MG tablet    Sig: Take 1 tablet (0.5 mg total) by mouth 3 (three) times daily as needed for anxiety.    Dispense:  45 tablet    Refill:  1  . lisdexamfetamine (VYVANSE) 50 MG capsule    Sig: Take 1 capsule (50 mg total) by mouth daily.    Dispense:  30 capsule    Refill:  0  . hydrOXYzine (ATARAX/VISTARIL) 25 MG tablet    Sig: TAKE 1 TO 2 TABLETS BY MOUTH AT BEDTIME AS NEEDED FOR SLEEP    Dispense:  90 tablet    Refill:  1  . azelastine (OPTIVAR) 0.05 % ophthalmic solution    Sig: Place 1 drop into both eyes 2 (two) times daily as needed.    Dispense:  18 mL    Refill:  1  . rizatriptan (MAXALT-MLT) 10 MG disintegrating tablet    Sig: Take 1 tablet (10 mg total) by mouth as needed for migraine. May repeat in 2 hours if needed    Dispense:  10 tablet    Refill:  0  . lisdexamfetamine (VYVANSE) 50 MG capsule    Sig: Take 1 capsule (50 mg total) by mouth daily.    Dispense:  30 capsule    Refill:  0  . lisdexamfetamine (VYVANSE) 50 MG capsule    Sig: Take 1 capsule (50 mg total) by mouth daily.    Dispense:  30 capsule    Refill:  0  . fluticasone (FLONASE) 50 MCG/ACT nasal spray    Sig: Place 2 sprays into both nostrils daily.    Dispense:  16 g    Refill:  6    Problem List Items Addressed This Visit      Unprioritized   ADD (attention deficit disorder) - Primary   Relevant Medications   lisdexamfetamine  (VYVANSE) 50 MG capsule   lisdexamfetamine (VYVANSE) 50 MG capsule   lisdexamfetamine (VYVANSE) 50 MG capsule   Anxiety   Relevant Medications   ALPRAZolam (XANAX) 0.5 MG tablet   hydrOXYzine (ATARAX/VISTARIL) 25 MG tablet   Nonintractable migraine, unspecified migraine type   Relevant Medications   rizatriptan (MAXALT-MLT) 10 MG disintegrating tablet   Seasonal allergies   Relevant Medications   hydrOXYzine (ATARAX/VISTARIL) 25 MG tablet   azelastine (OPTIVAR) 0.05 % ophthalmic solution   fluticasone (FLONASE) 50 MCG/ACT nasal spray   SINUSITIS- ACUTE-NOS    Stable con't meds       Relevant Medications   fluticasone (FLONASE) 50 MCG/ACT nasal spray    Other Visit Diagnoses    High risk medication use       Relevant Orders   DRUG MONITORING, PANEL 8 WITH CONFIRMATION, URINE      Follow-up: Return in about 6 months (around 08/25/2020), or if symptoms worsen or fail to improve.  Ann Held, DO

## 2020-02-24 NOTE — Assessment & Plan Note (Signed)
Stable con't meds 

## 2020-02-24 NOTE — Assessment & Plan Note (Signed)
Stable con't meds Database reviewed uds / contract updated

## 2020-02-26 LAB — DRUG MONITORING, PANEL 8 WITH CONFIRMATION, URINE
6 Acetylmorphine: NEGATIVE ng/mL (ref <10)
Alcohol Metabolites: NEGATIVE ng/mL
Amphetamine: 6101 ng/mL — ABNORMAL HIGH (ref <250)
Amphetamines: POSITIVE ng/mL — AB (ref <500)
Benzodiazepines: NEGATIVE ng/mL (ref <100)
Buprenorphine, Urine: NEGATIVE ng/mL (ref <5)
Cocaine Metabolite: NEGATIVE ng/mL (ref <150)
Codeine: NEGATIVE ng/mL (ref <50)
Creatinine: 88.9 mg/dL
Hydrocodone: NEGATIVE ng/mL (ref <50)
Hydromorphone: NEGATIVE ng/mL (ref <50)
MDMA: NEGATIVE ng/mL (ref <500)
Marijuana Metabolite: NEGATIVE ng/mL (ref <20)
Methamphetamine: NEGATIVE ng/mL (ref <250)
Morphine: NEGATIVE ng/mL (ref <50)
Norhydrocodone: NEGATIVE ng/mL (ref <50)
Opiates: NEGATIVE ng/mL (ref <100)
Oxidant: NEGATIVE ug/mL
Oxycodone: NEGATIVE ng/mL (ref <100)
pH: 6.8 (ref 4.5–9.0)

## 2020-02-26 LAB — DM TEMPLATE

## 2020-03-09 ENCOUNTER — Other Ambulatory Visit (INDEPENDENT_AMBULATORY_CARE_PROVIDER_SITE_OTHER): Payer: Self-pay | Admitting: Family Medicine

## 2020-03-09 DIAGNOSIS — F3289 Other specified depressive episodes: Secondary | ICD-10-CM

## 2020-03-13 ENCOUNTER — Encounter (INDEPENDENT_AMBULATORY_CARE_PROVIDER_SITE_OTHER): Payer: Self-pay | Admitting: Physician Assistant

## 2020-03-14 ENCOUNTER — Other Ambulatory Visit (INDEPENDENT_AMBULATORY_CARE_PROVIDER_SITE_OTHER): Payer: Self-pay | Admitting: Family Medicine

## 2020-03-14 ENCOUNTER — Other Ambulatory Visit (INDEPENDENT_AMBULATORY_CARE_PROVIDER_SITE_OTHER): Payer: Self-pay | Admitting: Physician Assistant

## 2020-03-14 ENCOUNTER — Other Ambulatory Visit: Payer: Self-pay | Admitting: Family Medicine

## 2020-03-14 DIAGNOSIS — E119 Type 2 diabetes mellitus without complications: Secondary | ICD-10-CM

## 2020-03-14 DIAGNOSIS — J302 Other seasonal allergic rhinitis: Secondary | ICD-10-CM

## 2020-03-14 DIAGNOSIS — G43009 Migraine without aura, not intractable, without status migrainosus: Secondary | ICD-10-CM

## 2020-03-14 DIAGNOSIS — F3289 Other specified depressive episodes: Secondary | ICD-10-CM

## 2020-03-15 ENCOUNTER — Other Ambulatory Visit (INDEPENDENT_AMBULATORY_CARE_PROVIDER_SITE_OTHER): Payer: Self-pay | Admitting: Physician Assistant

## 2020-03-15 DIAGNOSIS — E559 Vitamin D deficiency, unspecified: Secondary | ICD-10-CM

## 2020-03-15 MED ORDER — METFORMIN HCL 1000 MG PO TABS: 1000.0000 mg | ORAL_TABLET | Freq: Two times a day (BID) | ORAL | 0 refills | Status: DC

## 2020-03-20 MED ORDER — RIZATRIPTAN BENZOATE 10 MG PO TBDP: 10.0000 mg | ORAL_TABLET | ORAL | 4 refills | Status: AC | PRN

## 2020-03-20 MED ORDER — LEVOCETIRIZINE DIHYDROCHLORIDE 5 MG PO TABS: 5.0000 mg | ORAL_TABLET | Freq: Every evening | ORAL | 3 refills | Status: DC

## 2020-04-02 ENCOUNTER — Other Ambulatory Visit: Payer: Self-pay | Admitting: Family Medicine

## 2020-04-02 DIAGNOSIS — J302 Other seasonal allergic rhinitis: Secondary | ICD-10-CM

## 2020-04-08 ENCOUNTER — Encounter: Payer: Self-pay | Admitting: Family Medicine

## 2020-04-16 ENCOUNTER — Other Ambulatory Visit (INDEPENDENT_AMBULATORY_CARE_PROVIDER_SITE_OTHER): Payer: Self-pay | Admitting: Physician Assistant

## 2020-04-16 DIAGNOSIS — E1169 Type 2 diabetes mellitus with other specified complication: Secondary | ICD-10-CM

## 2020-04-16 DIAGNOSIS — E785 Hyperlipidemia, unspecified: Secondary | ICD-10-CM

## 2020-05-07 ENCOUNTER — Encounter (INDEPENDENT_AMBULATORY_CARE_PROVIDER_SITE_OTHER): Payer: Self-pay | Admitting: Physician Assistant

## 2020-05-10 ENCOUNTER — Other Ambulatory Visit (INDEPENDENT_AMBULATORY_CARE_PROVIDER_SITE_OTHER): Payer: Self-pay | Admitting: Physician Assistant

## 2020-05-10 DIAGNOSIS — E559 Vitamin D deficiency, unspecified: Secondary | ICD-10-CM

## 2020-05-21 ENCOUNTER — Other Ambulatory Visit: Payer: Self-pay

## 2020-05-21 ENCOUNTER — Ambulatory Visit (INDEPENDENT_AMBULATORY_CARE_PROVIDER_SITE_OTHER): Payer: BC Managed Care – PPO | Admitting: Physician Assistant

## 2020-05-21 ENCOUNTER — Encounter (INDEPENDENT_AMBULATORY_CARE_PROVIDER_SITE_OTHER): Payer: Self-pay | Admitting: Physician Assistant

## 2020-05-21 VITALS — BP 118/74 | HR 81 | Temp 97.8°F | Ht 65.0 in | Wt 166.0 lb

## 2020-05-21 DIAGNOSIS — E669 Obesity, unspecified: Secondary | ICD-10-CM

## 2020-05-21 DIAGNOSIS — E559 Vitamin D deficiency, unspecified: Secondary | ICD-10-CM | POA: Diagnosis not present

## 2020-05-21 DIAGNOSIS — Z6830 Body mass index (BMI) 30.0-30.9, adult: Secondary | ICD-10-CM

## 2020-05-21 DIAGNOSIS — F3289 Other specified depressive episodes: Secondary | ICD-10-CM | POA: Diagnosis not present

## 2020-05-21 DIAGNOSIS — E785 Hyperlipidemia, unspecified: Secondary | ICD-10-CM

## 2020-05-21 DIAGNOSIS — E1169 Type 2 diabetes mellitus with other specified complication: Secondary | ICD-10-CM | POA: Diagnosis not present

## 2020-05-21 DIAGNOSIS — Z9189 Other specified personal risk factors, not elsewhere classified: Secondary | ICD-10-CM

## 2020-05-21 MED ORDER — VITAMIN D (ERGOCALCIFEROL) 1.25 MG (50000 UNIT) PO CAPS: 50000.0000 [IU] | ORAL_CAPSULE | ORAL | 0 refills | Status: DC

## 2020-05-21 MED ORDER — EMPAGLIFLOZIN 25 MG PO TABS: 25.0000 mg | ORAL_TABLET | Freq: Every day | ORAL | 0 refills | Status: DC

## 2020-05-21 MED ORDER — BUPROPION HCL ER (SR) 150 MG PO TB12: 150.0000 mg | ORAL_TABLET | Freq: Two times a day (BID) | ORAL | 0 refills | Status: DC

## 2020-05-21 MED ORDER — ATORVASTATIN CALCIUM 10 MG PO TABS: 10.0000 mg | ORAL_TABLET | Freq: Every day | ORAL | 0 refills | Status: DC

## 2020-05-22 ENCOUNTER — Ambulatory Visit: Payer: BC Managed Care – PPO | Admitting: Family Medicine

## 2020-05-22 ENCOUNTER — Encounter: Payer: Self-pay | Admitting: Family Medicine

## 2020-05-22 ENCOUNTER — Other Ambulatory Visit: Payer: Self-pay

## 2020-05-22 VITALS — BP 118/88 | HR 110 | Temp 98.5°F | Resp 18 | Ht 65.0 in | Wt 169.0 lb

## 2020-05-22 DIAGNOSIS — M67442 Ganglion, left hand: Secondary | ICD-10-CM

## 2020-05-22 NOTE — Progress Notes (Signed)
Chief Complaint:   OBESITY Jillian Green is here to discuss her progress with her obesity treatment plan along with follow-up of her obesity related diagnoses. Jillian Green is on the Category 3 Plan and states she is following her eating plan approximately 50% of the time. Jillian Green states she is walking 3 miles 5 times per week.  Today's visit was #: 27 Starting weight: 184 lbs Starting date: 04/29/2018 Today's weight: 166 lbs Today's date: 05/21/2020 Total lbs lost to date: 18 Total lbs lost since last in-office visit: 0  Interim History: Jillian Green feels that she has been off track recently due to increased stress. She is indulging in more carbs more often. She is ready to get back on track.   Subjective:   Hyperlipidemia associated with type 2 diabetes mellitus (HCC). Jillian Green is on atorvastatin and denies myalgias. She is walking regularly.  Lab Results  Component Value Date   CHOL 155 02/17/2020   HDL 60 02/17/2020   LDLCALC 81 02/17/2020   TRIG 70 02/17/2020   Lab Results  Component Value Date   ALT 20 02/17/2020   AST 16 02/17/2020   ALKPHOS 117 02/17/2020   BILITOT 0.4 02/17/2020   The 10-year ASCVD risk score Denman George DC Jr., et al., 2013) is: 0.9%   Values used to calculate the score:     Age: 49 years     Sex: Female     Is Non-Hispanic African American: No     Diabetic: Yes     Tobacco smoker: No     Systolic Blood Pressure: 118 mmHg     Is BP treated: No     HDL Cholesterol: 60 mg/dL     Total Cholesterol: 155 mg/dL  Vitamin D deficiency. Jillian Green is on prescription Vitamin D. She denies a lot of outdoor activity currently.   Ref. Range 02/17/2020 11:01  Vitamin D, 25-Hydroxy Latest Ref Range: 30.0 - 100.0 ng/mL 58.8   Other depression, with emotional eating. Jillian Green is struggling with emotional eating and using food for comfort to the extent that it is negatively impacting her health. She has been working on behavior modification techniques to help reduce her  emotional eating and has been somewhat successful. She shows no sign of suicidal or homicidal ideations.  Type 2 diabetes mellitus with hyperlipidemia (HCC). Postprandial blood sugars are in the range of 140-150. She states she forgets to take blood sugars. She is on Jardiance and metformin.   Lab Results  Component Value Date   HGBA1C 6.0 (H) 02/17/2020   HGBA1C 5.9 (H) 08/22/2019   HGBA1C 5.8 (H) 03/11/2019   Lab Results  Component Value Date   LDLCALC 81 02/17/2020   CREATININE 0.78 02/17/2020   Lab Results  Component Value Date   INSULIN 9.9 02/17/2020   INSULIN 13.8 08/22/2019   INSULIN 7.6 03/11/2019   INSULIN 15.9 09/13/2018   INSULIN 18.4 04/29/2018   At risk for hypoglycemia. Jillian Green is at increased risk for hypoglycemia due to changes in diet, diagnosis of diabetes, and/or insulin use.    Assessment/Plan:   Hyperlipidemia associated with type 2 diabetes mellitus (HCC). Cardiovascular risk and specific lipid/LDL goals reviewed.  We discussed several lifestyle modifications today and [name] will continue to work on diet, exercise and weight loss efforts. Orders and follow up as documented in patient record. Refill was given for atorvastatin (LIPITOR) 10 MG tablet #90 with 0 refills.  Counseling Intensive lifestyle modifications are the first line treatment for this issue.  Dietary  changes: Increase soluble fiber. Decrease simple carbohydrates.  Exercise changes: Moderate to vigorous-intensity aerobic activity 150 minutes per week if tolerated.  Lipid-lowering medications: see documented in medical record.   Vitamin D deficiency. Low Vitamin D level contributes to fatigue and are associated with obesity, breast, and colon cancer. She was given a refill on her Vitamin D, Ergocalciferol, (DRISDOL) 1.25 MG (50000 UNIT) CAPS capsule every week #4 with 0 refills and will follow-up for routine testing of Vitamin D, at least 2-3 times per year to avoid over-replacement.   Other  depression, with emotional eating. Behavior modification techniques were discussed today to help Jillian Green deal with her emotional/non-hunger eating behaviors.  Orders and follow up as documented in patient record. Jillian Green will change to buPROPion (WELLBUTRIN SR) 150 MG 12 hr tablet BID #180 with 0 refills.  Type 2 diabetes mellitus with hyperlipidemia (HCC). Good blood sugar control is important to decrease the likelihood of diabetic complications such as nephropathy, neuropathy, limb loss, blindness, coronary artery disease, and death. Intensive lifestyle modification including diet, exercise and weight loss are the first line of treatment for diabetes. Refill was given for empagliflozin (JARDIANCE) 25 MG TABS tablet #90 with 0 refills.  At risk for hypoglycemia. Jillian Green was given approximately 15 minutes of counseling today regarding prevention of hypoglycemia. She was advised of symptoms of hypoglycemia. Jillian Green was instructed to avoid skipping meals, eat regular protein rich meals and schedule low calorie snacks as needed.   Repetitive spaced learning was employed today to elicit superior memory formation and behavioral change.  Class 1 obesity with serious comorbidity and body mass index (BMI) of 30.0 to 30.9 in adult, unspecified obesity type - Starting BMI > 30.  Jillian Green is currently in the action stage of change. As such, her goal is to continue with weight loss efforts. She has agreed to the Category 3 Plan.   Labs and IC will be checked at her next visit.  Exercise goals: For substantial health benefits, adults should do at least 150 minutes (2 hours and 30 minutes) a week of moderate-intensity, or 75 minutes (1 hour and 15 minutes) a week of vigorous-intensity aerobic physical activity, or an equivalent combination of moderate- and vigorous-intensity aerobic activity. Aerobic activity should be performed in episodes of at least 10 minutes, and preferably, it should be spread throughout the  week.  Behavioral modification strategies: no skipping meals and meal planning and cooking strategies.  Jillian Green has agreed to follow-up with our clinic in 3-4 weeks. She was informed of the importance of frequent follow-up visits to maximize her success with intensive lifestyle modifications for her multiple health conditions.   Objective:   Blood pressure 118/74, pulse 81, temperature 97.8 F (36.6 C), temperature source Oral, height 5\' 5"  (1.651 m), weight 166 lb (75.3 kg), SpO2 98 %. Body mass index is 27.62 kg/m.  General: Cooperative, alert, well developed, in no acute distress. HEENT: Conjunctivae and lids unremarkable. Cardiovascular: Regular rhythm.  Lungs: Normal work of breathing. Neurologic: No focal deficits.   Lab Results  Component Value Date   CREATININE 0.78 02/17/2020   BUN 12 02/17/2020   NA 141 02/17/2020   K 4.4 02/17/2020   CL 103 02/17/2020   CO2 25 02/17/2020   Lab Results  Component Value Date   ALT 20 02/17/2020   AST 16 02/17/2020   ALKPHOS 117 02/17/2020   BILITOT 0.4 02/17/2020   Lab Results  Component Value Date   HGBA1C 6.0 (H) 02/17/2020   HGBA1C 5.9 (H)  08/22/2019   HGBA1C 5.8 (H) 03/11/2019   HGBA1C 7.1 (H) 09/13/2018   HGBA1C 7.7 (H) 04/29/2018   Lab Results  Component Value Date   INSULIN 9.9 02/17/2020   INSULIN 13.8 08/22/2019   INSULIN 7.6 03/11/2019   INSULIN 15.9 09/13/2018   INSULIN 18.4 04/29/2018   Lab Results  Component Value Date   TSH 0.770 04/29/2018   Lab Results  Component Value Date   CHOL 155 02/17/2020   HDL 60 02/17/2020   LDLCALC 81 02/17/2020   TRIG 70 02/17/2020   Lab Results  Component Value Date   WBC 6.8 04/29/2018   HGB 13.7 04/29/2018   HCT 42.3 04/29/2018   MCV 95 04/29/2018   PLT 305 10/23/2016   Lab Results  Component Value Date   IRON 156 (H) 09/23/2013   FERRITIN 103.5 09/23/2013   Attestation Statements:   Reviewed by clinician on day of visit: allergies, medications, problem  list, medical history, surgical history, family history, social history, and previous encounter notes.  IMarianna Payment, am acting as transcriptionist for Alois Cliche, PA-C   I have reviewed the above documentation for accuracy and completeness, and I agree with the above. Alois Cliche, PA-C

## 2020-05-22 NOTE — Patient Instructions (Signed)
Ganglion Cyst  A ganglion cyst is a non-cancerous, fluid-filled lump that occurs near a joint or tendon. The cyst grows out of a joint or the lining of a tendon. Ganglion cysts most often develop in the hand or wrist, but they can also develop in the shoulder, elbow, hip, knee, ankle, or foot. Ganglion cysts are ball-shaped or egg-shaped. Their size can range from the size of a pea to larger than a grape. Increased activity may cause the cyst to get bigger because more fluid starts to build up. What are the causes? The exact cause of this condition is not known, but it may be related to:  Inflammation or irritation around the joint.  An injury.  Repetitive movements or overuse.  Arthritis. What increases the risk? You are more likely to develop this condition if:  You are a woman.  You are 15-40 years old. What are the signs or symptoms? The main symptom of this condition is a lump. It most often appears on the hand or wrist. In many cases, there are no other symptoms, but a cyst can sometimes cause:  Tingling.  Pain.  Numbness.  Muscle weakness.  Weak grip.  Less range of motion in a joint. How is this diagnosed? Ganglion cysts are usually diagnosed based on a physical exam. Your health care provider will feel the lump and may shine a light next to it. If it is a ganglion cyst, the light will likely shine through it. Your health care provider may order an X-ray, ultrasound, or MRI to rule out other conditions. How is this treated? Ganglion cysts often go away on their own without treatment. If you have pain or other symptoms, treatment may be needed. Treatment is also needed if the ganglion cyst limits your movement or if it gets infected. Treatment may include:  Wearing a brace or splint on your wrist or finger.  Taking anti-inflammatory medicine.  Having fluid drained from the lump with a needle (aspiration).  Getting a steroid injected into the joint.  Having  surgery to remove the ganglion cyst.  Placing a pad on your shoe or wearing shoes that will not rub against the cyst if it is on your foot. Follow these instructions at home:  Do not press on the ganglion cyst, poke it with a needle, or hit it.  Take over-the-counter and prescription medicines only as told by your health care provider.  If you have a brace or splint: ? Wear it as told by your health care provider. ? Remove it as told by your health care provider. Ask if you need to remove it when you take a shower or a bath.  Watch your ganglion cyst for any changes.  Keep all follow-up visits as told by your health care provider. This is important. Contact a health care provider if:  Your ganglion cyst becomes larger or more painful.  You have pus coming from the lump.  You have weakness or numbness in the affected area.  You have a fever or chills. Get help right away if:  You have a fever and have any of these in the cyst area: ? Increased redness. ? Red streaks. ? Swelling. Summary  A ganglion cyst is a non-cancerous, fluid-filled lump that occurs near a joint or tendon.  Ganglion cysts most often develop in the hand or wrist, but they can also develop in the shoulder, elbow, hip, knee, ankle, or foot.  Ganglion cysts often go away on their own without treatment.   This information is not intended to replace advice given to you by your health care provider. Make sure you discuss any questions you have with your health care provider. Document Revised: 07/03/2017 Document Reviewed: 03/20/2017 Elsevier Patient Education  2020 [employer] Inc[employer]  

## 2020-05-22 NOTE — Progress Notes (Signed)
Patient ID: Jillian Green, female    DOB: [birth year]  Age: 49 y.o. MRN: [id number]    Subjective:  Subjective  HPI Jillian Green presents for knot L palmer surface forth finger x 3-4 weeks.   Growing quickly and becoming painful  No known injury  Review of Systems  Constitutional: Negative for appetite change, diaphoresis, fatigue and unexpected weight change.  Eyes: Negative for pain, redness and visual disturbance.  Respiratory: Negative for cough, chest tightness, shortness of breath and wheezing.   Cardiovascular: Negative for chest pain, palpitations and leg swelling.  Endocrine: Negative for cold intolerance, heat intolerance, polydipsia, polyphagia and polyuria.  Genitourinary: Negative for difficulty urinating, dysuria and frequency.  Musculoskeletal: Positive for joint swelling.  Skin:       L hand 4th finger -- palmer side + pea sized knot  Tender to touch    Neurological: Negative for dizziness, light-headedness, numbness and headaches.    History Past Medical History:  Diagnosis Date  . ADD (attention deficit disorder)   . Anxiety   . Back pain   . Chronic headaches   . Depression   . Fibromyalgia   . GERD (gastroesophageal reflux disease)   . Itchy skin   . Joint pain   . Lactose intolerance   . Migraine   . Sweating increase   . Type 2 diabetes mellitus without complication, without long-term current use of insulin ([place]) 09/13/2018  . Unspecified eustachian tube disorder     She has a past surgical history that includes Sinus exploration; Cesarean section; and OTHER SURGICAL HISTORY.   Her family history includes ADD / ADHD in her sister; Alcoholism in her father; Anxiety disorder in her mother; Heart disease in her father; Hypertension in her father and mother; Obesity in her mother; Other in her father; Sleep apnea in her mother; Thyroid disease in her mother.She reports that she quit smoking about 14 years ago. She quit after 8.00 years of use.  She has never used smokeless tobacco. She reports current alcohol use of about 1.0 standard drink of alcohol per week. She reports that she does not use drugs.  Current Outpatient Medications on File Prior to Visit  Medication Sig Dispense Refill  . ALPRAZolam (XANAX) 0.5 MG tablet Take 1 tablet (0.5 mg total) by mouth 3 (three) times daily as needed for anxiety. 45 tablet 1  . atorvastatin (LIPITOR) 10 MG tablet Take 1 tablet (10 mg total) by mouth daily. 90 tablet 0  . azelastine (OPTIVAR) 0.05 % ophthalmic solution Place 1 drop into both eyes 2 (two) times daily as needed. 18 mL 1  . BD PEN NEEDLE NANO U/F 32G X 4 MM MISC USE TWICE A DAY 100 each 0  . blood glucose meter kit and supplies Dispense based on patient and insurance preference. Use up to four times daily as directed. (FOR ICD-10 E10.9, E11.9). 1 each 0  . buPROPion (WELLBUTRIN SR) 150 MG 12 hr tablet Take 1 tablet (150 mg total) by mouth 2 (two) times daily. 180 tablet 0  . DULoxetine (CYMBALTA) 60 MG capsule TAKE 1 CAPSULE (60 MG TOTAL) BY MOUTH 2 (TWO) TIMES DAILY. 180 capsule 1  . empagliflozin (JARDIANCE) 25 MG TABS tablet Take 1 tablet (25 mg total) by mouth daily. 90 tablet 0  . fluticasone (FLONASE) 50 MCG/ACT nasal spray Place 2 sprays into both nostrils daily. 16 g 6  . glucose blood (ONETOUCH ULTRA) test strip USE AS INSTRUCTED 100 strip 0  . hydrOXYzine (ATARAX/VISTARIL)  25 MG tablet TAKE 1 TO 2 TABLETS BY MOUTH AT BEDTIME AS NEEDED FOR SLEEP 180 tablet 1  . levocetirizine (XYZAL) 5 MG tablet Take 1 tablet (5 mg total) by mouth every evening. 90 tablet 3  . lisdexamfetamine (VYVANSE) 50 MG capsule Take 1 capsule (50 mg total) by mouth daily. 30 capsule 0  . lisdexamfetamine (VYVANSE) 50 MG capsule Take 1 capsule (50 mg total) by mouth daily. 30 capsule 0  . lisdexamfetamine (VYVANSE) 50 MG capsule Take 1 capsule (50 mg total) by mouth daily. 30 capsule 0  . metFORMIN (GLUCOPHAGE) 1000 MG tablet Take 1 tablet (1,000 mg  total) by mouth 2 (two) times daily with a meal. 180 tablet 0  . NONFORMULARY OR COMPOUNDED ITEM Tiger balm patches 1 patch daily prn 30 each 5  . OneTouch Delica Lancets 41D MISC 1 Device by Does not apply route daily. 100 each 0  . rizatriptan (MAXALT-MLT) 10 MG disintegrating tablet Take 1 tablet (10 mg total) by mouth as needed for migraine. May repeat in 2 hours if needed 10 tablet 4  . Vitamin D, Ergocalciferol, (DRISDOL) 1.25 MG (50000 UNIT) CAPS capsule Take 1 capsule (50,000 Units total) by mouth every 14 (fourteen) days. 4 capsule 0   No current facility-administered medications on file prior to visit.     Objective:  Objective  Physical Exam BP 118/88 (BP Location: Left Arm, Patient Position: Sitting, Cuff Size: Normal)   Pulse (!) 110   Temp 98.5 F (36.9 C) (Oral)   Resp 18   Ht 5' 5"  (1.651 m)   Wt 169 lb (76.7 kg)   SpO2 96%   BMI 28.12 kg/m  Wt Readings from Last 3 Encounters:  05/22/20 169 lb (76.7 kg)  05/21/20 166 lb (75.3 kg)  02/23/20 169 lb 6.4 oz (76.8 kg)     Lab Results  Component Value Date   WBC 6.8 04/29/2018   HGB 13.7 04/29/2018   HCT 42.3 04/29/2018   PLT 305 10/23/2016   GLUCOSE 109 (H) 02/17/2020   CHOL 155 02/17/2020   TRIG 70 02/17/2020   HDL 60 02/17/2020   LDLCALC 81 02/17/2020   ALT 20 02/17/2020   AST 16 02/17/2020   NA 141 02/17/2020   K 4.4 02/17/2020   CL 103 02/17/2020   CREATININE 0.78 02/17/2020   BUN 12 02/17/2020   CO2 25 02/17/2020   TSH 0.770 04/29/2018   HGBA1C 6.0 (H) 02/17/2020    MM 3D SCREEN BREAST BILATERAL  Result Date: 08/05/2018 CLINICAL DATA:  Screening. EXAM: DIGITAL SCREENING BILATERAL MAMMOGRAM WITH TOMO AND CAD COMPARISON:  Previous exam(s). ACR Breast Density Category b: There are scattered areas of fibroglandular density. FINDINGS: There are no findings suspicious for malignancy. Images were processed with CAD. IMPRESSION: No mammographic evidence of malignancy. A result letter of this screening  mammogram will be mailed directly to the patient. RECOMMENDATION: Screening mammogram in one year. (Code:[id number]-01Y) BI-RADS CATEGORY  1: Negative. Electronically Signed   By: Claudie Revering M.D.   On: 08/05/2018 15:00     Assessment & Plan:  Plan  I am having Jillian Green maintain her BD Pen Needle Nano U/F, NONFORMULARY OR COMPOUNDED ITEM, blood glucose meter kit and supplies, OneTouch Ultra, OneTouch Delica Lancets 40C, DULoxetine, ALPRAZolam, lisdexamfetamine, azelastine, lisdexamfetamine, lisdexamfetamine, fluticasone, levocetirizine, rizatriptan, metFORMIN, hydrOXYzine, atorvastatin, Vitamin D (Ergocalciferol), buPROPion, and empagliflozin.  No orders of the defined types were placed in this encounter.   Problem List Items Addressed This Visit  None    Visit Diagnoses    Ganglion cyst of finger of left hand    -  Primary   Relevant Orders   Ambulatory referral to Orthopedic Surgery      Follow-up: Return if symptoms worsen or fail to improve.  Ann Held, DO

## 2020-05-28 ENCOUNTER — Other Ambulatory Visit (INDEPENDENT_AMBULATORY_CARE_PROVIDER_SITE_OTHER): Payer: Self-pay | Admitting: Physician Assistant

## 2020-05-28 DIAGNOSIS — E785 Hyperlipidemia, unspecified: Secondary | ICD-10-CM

## 2020-05-28 DIAGNOSIS — E1169 Type 2 diabetes mellitus with other specified complication: Secondary | ICD-10-CM

## 2020-05-30 ENCOUNTER — Encounter: Payer: Self-pay | Admitting: Family Medicine

## 2020-05-30 NOTE — Telephone Encounter (Signed)
Can we move this referral please?

## 2020-06-03 ENCOUNTER — Other Ambulatory Visit (INDEPENDENT_AMBULATORY_CARE_PROVIDER_SITE_OTHER): Payer: Self-pay | Admitting: Physician Assistant

## 2020-06-03 DIAGNOSIS — E119 Type 2 diabetes mellitus without complications: Secondary | ICD-10-CM

## 2020-06-04 NOTE — Telephone Encounter (Signed)
Jillian Green pt

## 2020-06-05 ENCOUNTER — Other Ambulatory Visit (INDEPENDENT_AMBULATORY_CARE_PROVIDER_SITE_OTHER): Payer: Self-pay

## 2020-06-05 DIAGNOSIS — F419 Anxiety disorder, unspecified: Secondary | ICD-10-CM

## 2020-06-05 NOTE — Telephone Encounter (Signed)
Would you like me to refill this rx?

## 2020-06-05 NOTE — Telephone Encounter (Signed)
Ok to refill. Thanks 

## 2020-06-05 NOTE — Telephone Encounter (Signed)
°  NP-[facility], [facility] has a pending order for this patient already.

## 2020-06-11 ENCOUNTER — Other Ambulatory Visit (INDEPENDENT_AMBULATORY_CARE_PROVIDER_SITE_OTHER): Payer: Self-pay | Admitting: Physician Assistant

## 2020-06-11 DIAGNOSIS — E119 Type 2 diabetes mellitus without complications: Secondary | ICD-10-CM

## 2020-06-11 NOTE — Telephone Encounter (Signed)
Would you like for me to refill this patients rx?

## 2020-06-11 NOTE — Telephone Encounter (Signed)
This patient was last seen by Alois Cliche, PA-C, and currently has an upcoming appt scheduled on 06/18/20 with her.

## 2020-06-13 ENCOUNTER — Other Ambulatory Visit (INDEPENDENT_AMBULATORY_CARE_PROVIDER_SITE_OTHER): Payer: Self-pay | Admitting: Physician Assistant

## 2020-06-13 ENCOUNTER — Encounter (INDEPENDENT_AMBULATORY_CARE_PROVIDER_SITE_OTHER): Payer: Self-pay | Admitting: Physician Assistant

## 2020-06-13 DIAGNOSIS — E1169 Type 2 diabetes mellitus with other specified complication: Secondary | ICD-10-CM

## 2020-06-13 DIAGNOSIS — E785 Hyperlipidemia, unspecified: Secondary | ICD-10-CM

## 2020-06-13 NOTE — Telephone Encounter (Signed)
Left pt a Print production planner message

## 2020-06-13 NOTE — Telephone Encounter (Signed)
Message to patient this morning.

## 2020-06-18 ENCOUNTER — Other Ambulatory Visit: Payer: Self-pay

## 2020-06-18 ENCOUNTER — Ambulatory Visit (INDEPENDENT_AMBULATORY_CARE_PROVIDER_SITE_OTHER): Payer: BC Managed Care – PPO | Admitting: Physician Assistant

## 2020-06-18 VITALS — BP 110/79 | HR 92 | Temp 98.1°F | Ht 65.0 in | Wt 165.0 lb

## 2020-06-18 DIAGNOSIS — E1169 Type 2 diabetes mellitus with other specified complication: Secondary | ICD-10-CM | POA: Diagnosis not present

## 2020-06-18 DIAGNOSIS — Z9189 Other specified personal risk factors, not elsewhere classified: Secondary | ICD-10-CM | POA: Diagnosis not present

## 2020-06-18 DIAGNOSIS — E559 Vitamin D deficiency, unspecified: Secondary | ICD-10-CM | POA: Diagnosis not present

## 2020-06-18 DIAGNOSIS — E119 Type 2 diabetes mellitus without complications: Secondary | ICD-10-CM

## 2020-06-18 DIAGNOSIS — E669 Obesity, unspecified: Secondary | ICD-10-CM

## 2020-06-18 DIAGNOSIS — R0602 Shortness of breath: Secondary | ICD-10-CM | POA: Diagnosis not present

## 2020-06-18 DIAGNOSIS — E785 Hyperlipidemia, unspecified: Secondary | ICD-10-CM

## 2020-06-18 DIAGNOSIS — Z6830 Body mass index (BMI) 30.0-30.9, adult: Secondary | ICD-10-CM

## 2020-06-18 MED ORDER — METFORMIN HCL 1000 MG PO TABS: 1000.0000 mg | ORAL_TABLET | Freq: Two times a day (BID) | ORAL | 0 refills | Status: DC

## 2020-06-18 NOTE — Progress Notes (Signed)
Chief Complaint:   OBESITY Jillian Green is here to discuss her progress with her obesity treatment plan along with follow-up of her obesity related diagnoses. Jillian Green is on the Category 3 Plan and states she is following her eating plan approximately 70% of the time. Jillian Green states she is walking 3 miles daily.   Today's visit was #: 28 Starting weight: 184 lbs Starting date: 04/29/2018 Today's weight: 165 lbs Today's date: 06/18/2020 Total lbs lost to date: 19 Total lbs lost since last in-office visit: 1  Interim History: Jillian Green reports that she is eating on category 3 for breakfast and lunch, except she is not drinking the milk or having the cheese stick. She is creating more recipes for dinner, which is helping break the monotony of eating the same things.  Subjective:   Shortness of breath. Jillian Green reports shortness of breath with exertion. No dizziness or lightheadedness.  Type 2 diabetes mellitus with hyperlipidemia (HCC). Jillian Green is on metformin and Jardiance. Fasting blood sugars 90-120. No polyphagia or hypoglycemia.  Lab Results  Component Value Date   HGBA1C 6.0 (H) 02/17/2020   HGBA1C 5.9 (H) 08/22/2019   HGBA1C 5.8 (H) 03/11/2019   Lab Results  Component Value Date   LDLCALC 81 02/17/2020   CREATININE 0.78 02/17/2020   Lab Results  Component Value Date   INSULIN 9.9 02/17/2020   INSULIN 13.8 08/22/2019   INSULIN 7.6 03/11/2019   INSULIN 15.9 09/13/2018   INSULIN 18.4 04/29/2018   Hyperlipidemia associated with type 2 diabetes mellitus (HCC). Jillian Green is on Lipitor. No chest pain or myalgias.   Lab Results  Component Value Date   CHOL 155 02/17/2020   HDL 60 02/17/2020   LDLCALC 81 02/17/2020   TRIG 70 02/17/2020   Lab Results  Component Value Date   ALT 20 02/17/2020   AST 16 02/17/2020   ALKPHOS 117 02/17/2020   BILITOT 0.4 02/17/2020   The 10-year ASCVD risk score Denman George DC Jr., et al., 2013) is: 0.8%   Values used to calculate the score:      Age: 49 years     Sex: Female     Is Non-Hispanic African American: No     Diabetic: Yes     Tobacco smoker: No     Systolic Blood Pressure: 110 mmHg     Is BP treated: No     HDL Cholesterol: 60 mg/dL     Total Cholesterol: 155 mg/dL  Vitamin D deficiency. [name] is on Vitamin D every 14 days. Energy is reported to be good.   Ref. Range 02/17/2020 11:01  Vitamin D, 25-Hydroxy Latest Ref Range: 30.0 - 100.0 ng/mL 58.8   At risk for heart disease. Charlena is at a higher than average risk for cardiovascular disease due to obesity.   Assessment/Plan:   Shortness of breath. IC will be checked today.  Type 2 diabetes mellitus with hyperlipidemia (HCC). Good blood sugar control is important to decrease the likelihood of diabetic complications such as nephropathy, neuropathy, limb loss, blindness, coronary artery disease, and death. Intensive lifestyle modification including diet, exercise and weight loss are the first line of treatment for diabetes. Refill was given for metformin #90 with 0 refills.  Hyperlipidemia associated with type 2 diabetes mellitus (HCC). Cardiovascular risk and specific lipid/LDL goals reviewed.  We discussed several lifestyle modifications today and Camile will continue to work on diet, exercise and weight loss efforts. Orders and follow up as documented in patient record. Sharifa will continue Lipitor  as directed. Labs will be checked today.   Counseling Intensive lifestyle modifications are the first line treatment for this issue. . Dietary changes: Increase soluble fiber. Decrease simple carbohydrates. . Exercise changes: Moderate to vigorous-intensity aerobic activity 150 minutes per week if tolerated. . Lipid-lowering medications: see documented in medical record.  Vitamin D deficiency. Low Vitamin D level contributes to fatigue and are associated with obesity, breast, and colon cancer. She agrees to continue to take Vitamin D as directed and Vitamin D level will be  checked today.   At risk for heart disease. Jillian Green was given approximately 15 minutes of coronary artery disease prevention counseling today. She is 49 y.o. female and has risk factors for heart disease including obesity. We discussed intensive lifestyle modifications today with an emphasis on specific weight loss instructions and strategies.   Repetitive spaced learning was employed today to elicit superior memory formation and behavioral change.  Class 1 obesity with serious comorbidity and body mass index (BMI) of 30.0 to 30.9 in adult, unspecified obesity type.  Jillian Green is currently in the action stage of change. As such, her goal is to continue with weight loss efforts. She has agreed to the Category 3 Plan and journal 1500 calories and 95 grams of protein daily.   Exercise goals: For substantial health benefits, adults should do at least 150 minutes (2 hours and 30 minutes) a week of moderate-intensity, or 75 minutes (1 hour and 15 minutes) a week of vigorous-intensity aerobic physical activity, or an equivalent combination of moderate- and vigorous-intensity aerobic activity. Aerobic activity should be performed in episodes of at least 10 minutes, and preferably, it should be spread throughout the week.  Behavioral modification strategies: meal planning and cooking strategies and planning for success.  Jillian Green has agreed to follow-up with our clinic in 4-5 weeks. She was informed of the importance of frequent follow-up visits to maximize her success with intensive lifestyle modifications for her multiple health conditions.   Jillian Green was informed we would discuss her lab results at her next visit unless there is a critical issue that needs to be addressed sooner. Jillian Green agreed to keep her next visit at the agreed upon time to discuss these results.  Objective:   Blood pressure 110/79, pulse 92, temperature 98.1 F (36.7 C), temperature source Oral, height 5\' 5"  (1.651 m), weight 165 lb (74.8 kg),  SpO2 97 %. Body mass index is 27.46 kg/m.  General: Cooperative, alert, well developed, in no acute distress. HEENT: Conjunctivae and lids unremarkable. Cardiovascular: Regular rhythm.  Lungs: Normal work of breathing. Neurologic: No focal deficits.   Lab Results  Component Value Date   CREATININE 0.78 02/17/2020   BUN 12 02/17/2020   NA 141 02/17/2020   K 4.4 02/17/2020   CL 103 02/17/2020   CO2 25 02/17/2020   Lab Results  Component Value Date   ALT 20 02/17/2020   AST 16 02/17/2020   ALKPHOS 117 02/17/2020   BILITOT 0.4 02/17/2020   Lab Results  Component Value Date   HGBA1C 6.0 (H) 02/17/2020   HGBA1C 5.9 (H) 08/22/2019   HGBA1C 5.8 (H) 03/11/2019   HGBA1C 7.1 (H) 09/13/2018   HGBA1C 7.7 (H) 04/29/2018   Lab Results  Component Value Date   INSULIN 9.9 02/17/2020   INSULIN 13.8 08/22/2019   INSULIN 7.6 03/11/2019   INSULIN 15.9 09/13/2018   INSULIN 18.4 04/29/2018   Lab Results  Component Value Date   TSH 0.770 04/29/2018   Lab Results  Component Value  Date   CHOL 155 02/17/2020   HDL 60 02/17/2020   LDLCALC 81 02/17/2020   TRIG 70 02/17/2020   Lab Results  Component Value Date   WBC 6.8 04/29/2018   HGB 13.7 04/29/2018   HCT 42.3 04/29/2018   MCV 95 04/29/2018   PLT 305 10/23/2016   Lab Results  Component Value Date   IRON 156 (H) 09/23/2013   FERRITIN 103.5 09/23/2013   Attestation Statements:   Reviewed by clinician on day of visit: allergies, medications, problem list, medical history, surgical history, family history, social history, and previous encounter notes.  IMarianna Payment, am acting as transcriptionist for Alois Cliche, PA-C   I have reviewed the above documentation for accuracy and completeness, and I agree with the above. Alois Cliche, PA-C

## 2020-06-19 LAB — COMPREHENSIVE METABOLIC PANEL
ALT: 26 IU/L (ref 0–32)
AST: 14 IU/L (ref 0–40)
Albumin/Globulin Ratio: 2.5 — ABNORMAL HIGH (ref 1.2–2.2)
Albumin: 5 g/dL — ABNORMAL HIGH (ref 3.8–4.8)
Alkaline Phosphatase: 106 IU/L (ref 44–121)
BUN/Creatinine Ratio: 18 (ref 9–23)
BUN: 14 mg/dL (ref 6–24)
Bilirubin Total: 0.4 mg/dL (ref 0.0–1.2)
CO2: 24 mmol/L (ref 20–29)
Calcium: 9.6 mg/dL (ref 8.7–10.2)
Chloride: 105 mmol/L (ref 96–106)
Creatinine, Ser: 0.76 mg/dL (ref 0.57–1.00)
GFR calc Af Amer: 108 mL/min/{1.73_m2} (ref >59)
GFR calc non Af Amer: 94 mL/min/{1.73_m2} (ref >59)
Globulin, Total: 2 g/dL (ref 1.5–4.5)
Glucose: 127 mg/dL — ABNORMAL HIGH (ref 65–99)
Potassium: 4.3 mmol/L (ref 3.5–5.2)
Sodium: 143 mmol/L (ref 134–144)
Total Protein: 7 g/dL (ref 6.0–8.5)

## 2020-06-19 LAB — LIPID PANEL
Chol/HDL Ratio: 2.3 ratio (ref 0.0–4.4)
Cholesterol, Total: 139 mg/dL (ref 100–199)
HDL: 60 mg/dL (ref >39)
LDL Chol Calc (NIH): 66 mg/dL (ref 0–99)
Triglycerides: 65 mg/dL (ref 0–149)
VLDL Cholesterol Cal: 13 mg/dL (ref 5–40)

## 2020-06-19 LAB — HEMOGLOBIN A1C
Est. average glucose Bld gHb Est-mCnc: 120 mg/dL
Hgb A1c MFr Bld: 5.8 % — ABNORMAL HIGH (ref 4.8–5.6)

## 2020-06-19 LAB — VITAMIN D 25 HYDROXY (VIT D DEFICIENCY, FRACTURES): Vit D, 25-Hydroxy: 86.9 ng/mL (ref 30.0–100.0)

## 2020-06-19 LAB — INSULIN, RANDOM: INSULIN: 14 u[IU]/mL (ref 2.6–24.9)

## 2020-06-22 ENCOUNTER — Encounter: Payer: Self-pay | Admitting: Family Medicine

## 2020-06-22 DIAGNOSIS — F988 Other specified behavioral and emotional disorders with onset usually occurring in childhood and adolescence: Secondary | ICD-10-CM

## 2020-06-22 MED ORDER — LISDEXAMFETAMINE DIMESYLATE 50 MG PO CAPS: 50.0000 mg | ORAL_CAPSULE | Freq: Every day | ORAL | 0 refills | Status: DC

## 2020-06-22 NOTE — Telephone Encounter (Signed)
Requesting: Vyvanse Contract: 02/23/2020 UDS: 02/2020, low risk Last OV: 05/22/20 Next OV: N/A Last Refill: 02/23/2020, #30--0 RF Database:   Please advise

## 2020-07-09 ENCOUNTER — Encounter: Payer: Self-pay | Admitting: Family Medicine

## 2020-07-09 ENCOUNTER — Other Ambulatory Visit (INDEPENDENT_AMBULATORY_CARE_PROVIDER_SITE_OTHER): Payer: Self-pay | Admitting: Physician Assistant

## 2020-07-09 DIAGNOSIS — B359 Dermatophytosis, unspecified: Secondary | ICD-10-CM

## 2020-07-09 DIAGNOSIS — E559 Vitamin D deficiency, unspecified: Secondary | ICD-10-CM

## 2020-07-09 NOTE — Telephone Encounter (Signed)
Left Pt. Voicemal

## 2020-07-09 NOTE — Telephone Encounter (Signed)
Last seen by Tracey 

## 2020-07-10 NOTE — Telephone Encounter (Signed)
We can do televisit tomorrow She can try lotrisome ultra otc in the meantime -- if she has not already

## 2020-07-11 ENCOUNTER — Telehealth (INDEPENDENT_AMBULATORY_CARE_PROVIDER_SITE_OTHER): Payer: BC Managed Care – PPO | Admitting: Family Medicine

## 2020-07-11 ENCOUNTER — Encounter: Payer: Self-pay | Admitting: Family Medicine

## 2020-07-11 ENCOUNTER — Other Ambulatory Visit: Payer: Self-pay

## 2020-07-11 ENCOUNTER — Encounter (INDEPENDENT_AMBULATORY_CARE_PROVIDER_SITE_OTHER): Payer: Self-pay | Admitting: Physician Assistant

## 2020-07-11 DIAGNOSIS — B354 Tinea corporis: Secondary | ICD-10-CM

## 2020-07-11 MED ORDER — NAFTIFINE HCL 2 % EX CREA: TOPICAL_CREAM | CUTANEOUS | 1 refills | Status: DC

## 2020-07-11 NOTE — Telephone Encounter (Signed)
I left the patient a second voicemail at 10:18 am on 07/11/20.

## 2020-07-11 NOTE — Progress Notes (Signed)
Virtual Visit via Video Note  I connected with Melanny L Mokry on 07/11/20 at  8:40 AM EST by a video enabled telemedicine application and verified that I am speaking with the correct person using two identifiers.  Location: Patient: at work alone Provider: home    I discussed the limitations of evaluation and management by telemedicine and the availability of in person appointments. The patient expressed understanding and agreed to proceed.  History of Present Illness: Pt is home c/o rash R forearm.  She has used otc creams with no relief.  It is itchy   Observations/Objective: There were no vitals filed for this visit. Pt is in nad + circular rash R forearm---little bigger than a quarter and the edges are raised   Assessment and Plan: 1. Ringworm of body Can use otc cortisone also for itching  Call or rto prn  - Naftifine HCl (NAFTIN) 2 % CREA; Apply to affected area qd  Dispense: 60 g; Refill: 1   Follow Up Instructions:    I discussed the assessment and treatment plan with the patient. The patient was provided an opportunity to ask questions and all were answered. The patient agreed with the plan and demonstrated an understanding of the instructions.   The patient was advised to call back or seek an in-person evaluation if the symptoms worsen or if the condition fails to improve as anticipated.  I provided 25 minutes of non-face-to-face time during this encounter.   Donato Schultz, DO

## 2020-07-11 NOTE — Telephone Encounter (Signed)
Please review

## 2020-07-11 NOTE — Telephone Encounter (Signed)
She does not need a prescription any longer as her vitamin D was high enough on her last labs. She can change over to vitamin d 2,000 units daily over the counter. thanks

## 2020-07-16 NOTE — Telephone Encounter (Signed)
She may want to see derm---  ok to place referral if pt agrees

## 2020-07-16 NOTE — Telephone Encounter (Signed)
Referral placed.

## 2020-07-24 ENCOUNTER — Encounter: Payer: Self-pay | Admitting: Family Medicine

## 2020-07-24 DIAGNOSIS — F988 Other specified behavioral and emotional disorders with onset usually occurring in childhood and adolescence: Secondary | ICD-10-CM

## 2020-07-25 ENCOUNTER — Encounter: Payer: Self-pay | Admitting: Family Medicine

## 2020-07-25 DIAGNOSIS — M797 Fibromyalgia: Secondary | ICD-10-CM

## 2020-07-25 MED ORDER — LISDEXAMFETAMINE DIMESYLATE 50 MG PO CAPS: 50.0000 mg | ORAL_CAPSULE | Freq: Every day | ORAL | 0 refills | Status: DC

## 2020-07-25 NOTE — Telephone Encounter (Signed)
Requesting: Vyvanse Contract: 02/2020 UDS: 02/2020, low risk Last OV: 07/11/20 Next OV:  N/A Last Refill: 06/22/2020, #30--0 RF Database:   Please advise

## 2020-07-26 MED ORDER — DULOXETINE HCL 60 MG PO CPEP: 60.0000 mg | ORAL_CAPSULE | Freq: Two times a day (BID) | ORAL | 1 refills | Status: DC

## 2020-08-06 ENCOUNTER — Encounter (INDEPENDENT_AMBULATORY_CARE_PROVIDER_SITE_OTHER): Payer: Self-pay | Admitting: Physician Assistant

## 2020-08-06 ENCOUNTER — Ambulatory Visit (INDEPENDENT_AMBULATORY_CARE_PROVIDER_SITE_OTHER): Payer: BC Managed Care – PPO | Admitting: Physician Assistant

## 2020-08-06 ENCOUNTER — Other Ambulatory Visit: Payer: Self-pay

## 2020-08-06 VITALS — BP 96/69 | HR 85 | Temp 98.5°F | Ht 65.0 in | Wt 168.0 lb

## 2020-08-06 DIAGNOSIS — E559 Vitamin D deficiency, unspecified: Secondary | ICD-10-CM | POA: Diagnosis not present

## 2020-08-06 DIAGNOSIS — F3289 Other specified depressive episodes: Secondary | ICD-10-CM | POA: Diagnosis not present

## 2020-08-06 DIAGNOSIS — Z9189 Other specified personal risk factors, not elsewhere classified: Secondary | ICD-10-CM | POA: Diagnosis not present

## 2020-08-06 DIAGNOSIS — E1169 Type 2 diabetes mellitus with other specified complication: Secondary | ICD-10-CM | POA: Diagnosis not present

## 2020-08-06 DIAGNOSIS — E669 Obesity, unspecified: Secondary | ICD-10-CM

## 2020-08-06 DIAGNOSIS — E785 Hyperlipidemia, unspecified: Secondary | ICD-10-CM

## 2020-08-06 DIAGNOSIS — E7849 Other hyperlipidemia: Secondary | ICD-10-CM

## 2020-08-06 DIAGNOSIS — Z6830 Body mass index (BMI) 30.0-30.9, adult: Secondary | ICD-10-CM

## 2020-08-06 MED ORDER — EMPAGLIFLOZIN 25 MG PO TABS: 25.0000 mg | ORAL_TABLET | Freq: Every day | ORAL | 0 refills | Status: DC

## 2020-08-06 MED ORDER — BUPROPION HCL ER (SR) 150 MG PO TB12: 150.0000 mg | ORAL_TABLET | Freq: Two times a day (BID) | ORAL | 0 refills | Status: DC

## 2020-08-06 MED ORDER — ATORVASTATIN CALCIUM 10 MG PO TABS: 10.0000 mg | ORAL_TABLET | Freq: Every day | ORAL | 0 refills | Status: DC

## 2020-08-06 MED ORDER — METFORMIN HCL 1000 MG PO TABS: 1000.0000 mg | ORAL_TABLET | Freq: Two times a day (BID) | ORAL | 0 refills | Status: DC

## 2020-08-06 NOTE — Progress Notes (Signed)
Chief Complaint:   OBESITY Jillian Green is here to discuss her progress with her obesity treatment plan along with follow-up of her obesity related diagnoses. Jillian Green is on the Category 3 Plan and states she is following her eating plan approximately 25% of the time. Jillian Green states she is exercising 0 minutes 0 times per week.  Today's visit was #: 29 Starting weight: 184 lbs Starting date: 04/29/2018 Today's weight: 168 lbs Today's date: 08/06/2020 Total lbs lost to date: 16 lbs Total lbs lost since last in-office visit: 0 lbs  Interim History: Baneen reports that she got off track with holiday eating. She states that she is ready to get back on track.  Subjective:   1. Vitamin D deficiency Jillian Green is taking OTC Vit D as well as prescription Vit D 50,000 units every 2 weeks.  Ref. Range 06/18/2020 08:18  Vitamin D, 25-Hydroxy Latest Ref Range: 30.0 - 100.0 ng/mL 86.9    2. Type 2 diabetes mellitus with hyperlipidemia (HCC) Jillian Green is on Jardiance and Metformin. She denies nausea, vomiting, and diarrhea. She has not polyphagia.   Lab Results  Component Value Date   HGBA1C 5.8 (H) 06/18/2020   HGBA1C 6.0 (H) 02/17/2020   HGBA1C 5.9 (H) 08/22/2019   Lab Results  Component Value Date   LDLCALC 66 06/18/2020   CREATININE 0.76 06/18/2020   Lab Results  Component Value Date   INSULIN 14.0 06/18/2020   INSULIN 9.9 02/17/2020   INSULIN 13.8 08/22/2019   INSULIN 7.6 03/11/2019   INSULIN 15.9 09/13/2018    3. Other hyperlipidemia Jillian Green is prescribed Lipitor. She denies chest pain or myalgias.   Lab Results  Component Value Date   ALT 26 06/18/2020   AST 14 06/18/2020   ALKPHOS 106 06/18/2020   BILITOT 0.4 06/18/2020   Lab Results  Component Value Date   CHOL 139 06/18/2020   HDL 60 06/18/2020   LDLCALC 66 06/18/2020   TRIG 65 06/18/2020   CHOLHDL 2.3 06/18/2020    4. Other depression, with emotional eating  Jillian Green is prescribed Wellbutrin 150 mg. She denies suicidal or  homicidal ideations. She has no anxiety and notes that the Wellbutrin helps with cravings.   5. At risk for hypoglycemia Jillian Green is at increased risk for hypoglycemia due to changes in diet, diagnosis of diabetes, and/or insulin use. Jillian Green is not currently taking insulin.    Assessment/Plan:   1. Vitamin D deficiency Jillian Green will discontinue prescription Vit D 50,000 units and continue OTC Vit D.  2. Type 2 diabetes mellitus with hyperlipidemia (HCC) Good blood sugar control is important to decrease the likelihood of diabetic complications such as nephropathy, neuropathy, limb loss, blindness, coronary artery disease, and death. Intensive lifestyle modification including diet, exercise and weight loss are the first line of treatment for diabetes. We will refill Jardiance for #90 and Metformin for #90.  3. Other hyperlipidemia Cardiovascular risk and specific lipid/LDL goals reviewed.  We discussed several lifestyle modifications today and Jillian Green will continue to work on diet, exercise and weight loss efforts. Orders and follow up as documented in patient record. We will refill Lipitor for #90.  Counseling Intensive lifestyle modifications are the first line treatment for this issue. . Dietary changes: Increase soluble fiber. Decrease simple carbohydrates. . Exercise changes: Moderate to vigorous-intensity aerobic activity 150 minutes per week if tolerated. . Lipid-lowering medications: see documented in medical record.   4. Other depression, with emotional eating  Behavior modification techniques were discussed today to help Jillian Green  deal with her emotional/non-hunger eating behaviors.  Orders and follow up as documented in patient record. We will refill Wellbutrin for #180.  5. At risk for hypoglycemia Jillian Green was given approximately 15 minutes of counseling today regarding prevention of hypoglycemia. She was advised of symptoms of hypoglycemia. Jillian Green was instructed to avoid skipping meals, eat  regular protein rich meals and schedule low calorie snacks as needed.   Repetitive spaced learning was employed today to elicit superior memory formation and behavioral change  6. Class 1 obesity with serious comorbidity and body mass index (BMI) of 30.0 to 30.9 in adult, unspecified obesity type Jillian Green is currently in the action stage of change. As such, her goal is to continue with weight loss efforts. She has agreed to the Category 3 Plan.   Exercise goals: As is  Behavioral modification strategies: avoiding temptations and planning for success.  Jillian Green has agreed to follow-up with our clinic in 7-8 weeks. She was informed of the importance of frequent follow-up visits to maximize her success with intensive lifestyle modifications for her multiple health conditions.   Objective:   Blood pressure 96/69, pulse 85, temperature 98.5 F (36.9 C), height 5\' 5"  (1.651 m), weight 168 lb (76.2 kg), SpO2 99 %. Body mass index is 27.96 kg/m.  General: Cooperative, alert, well developed, in no acute distress. HEENT: Conjunctivae and lids unremarkable. Cardiovascular: Regular rhythm.  Lungs: Normal work of breathing. Neurologic: No focal deficits.   Lab Results  Component Value Date   CREATININE 0.76 06/18/2020   BUN 14 06/18/2020   NA 143 06/18/2020   K 4.3 06/18/2020   CL 105 06/18/2020   CO2 24 06/18/2020   Lab Results  Component Value Date   ALT 26 06/18/2020   AST 14 06/18/2020   ALKPHOS 106 06/18/2020   BILITOT 0.4 06/18/2020   Lab Results  Component Value Date   HGBA1C 5.8 (H) 06/18/2020   HGBA1C 6.0 (H) 02/17/2020   HGBA1C 5.9 (H) 08/22/2019   HGBA1C 5.8 (H) 03/11/2019   HGBA1C 7.1 (H) 09/13/2018   Lab Results  Component Value Date   INSULIN 14.0 06/18/2020   INSULIN 9.9 02/17/2020   INSULIN 13.8 08/22/2019   INSULIN 7.6 03/11/2019   INSULIN 15.9 09/13/2018   Lab Results  Component Value Date   TSH 0.770 04/29/2018   Lab Results  Component Value Date   CHOL  139 06/18/2020   HDL 60 06/18/2020   LDLCALC 66 06/18/2020   TRIG 65 06/18/2020   CHOLHDL 2.3 06/18/2020   Lab Results  Component Value Date   WBC 6.8 04/29/2018   HGB 13.7 04/29/2018   HCT 42.3 04/29/2018   MCV 95 04/29/2018   PLT 305 10/23/2016   Lab Results  Component Value Date   IRON 156 (H) 09/23/2013   FERRITIN 103.5 09/23/2013    Attestation Statements:   Reviewed by clinician on day of visit: allergies, medications, problem list, medical history, surgical history, family history, social history, and previous encounter notes.  09/25/2013, am acting as Edmund Hilda for Energy manager, PA-C.  I have reviewed the above documentation for accuracy and completeness, and I agree with the above. [employer], PA-C

## 2020-08-26 ENCOUNTER — Encounter: Payer: Self-pay | Admitting: Family Medicine

## 2020-08-27 ENCOUNTER — Other Ambulatory Visit: Payer: Self-pay

## 2020-08-27 ENCOUNTER — Other Ambulatory Visit: Payer: Self-pay | Admitting: Family Medicine

## 2020-08-27 DIAGNOSIS — F988 Other specified behavioral and emotional disorders with onset usually occurring in childhood and adolescence: Secondary | ICD-10-CM

## 2020-08-27 MED ORDER — LISDEXAMFETAMINE DIMESYLATE 50 MG PO CAPS: 50.0000 mg | ORAL_CAPSULE | Freq: Every day | ORAL | 0 refills | Status: DC

## 2020-08-27 NOTE — Progress Notes (Unsigned)
RequestingZettie Cooley Contract: 02/2020 UDS: 02/2020 Last Visit: 05/22/2020 Next Visit: none Last Refill: 07/25/2020  Please Advise

## 2020-08-27 NOTE — Progress Notes (Signed)
Sent!

## 2020-09-26 ENCOUNTER — Encounter (INDEPENDENT_AMBULATORY_CARE_PROVIDER_SITE_OTHER): Payer: Self-pay | Admitting: Physician Assistant

## 2020-09-27 NOTE — Telephone Encounter (Signed)
Please review

## 2020-09-27 NOTE — Telephone Encounter (Signed)
You can send her in 500mg  2 tabs twice daily #120 to hold her until her next refill of her other dose. Thanks

## 2020-10-01 ENCOUNTER — Encounter (INDEPENDENT_AMBULATORY_CARE_PROVIDER_SITE_OTHER): Payer: Self-pay | Admitting: Physician Assistant

## 2020-10-01 ENCOUNTER — Other Ambulatory Visit: Payer: Self-pay

## 2020-10-01 ENCOUNTER — Other Ambulatory Visit (INDEPENDENT_AMBULATORY_CARE_PROVIDER_SITE_OTHER): Payer: Self-pay

## 2020-10-01 ENCOUNTER — Ambulatory Visit (INDEPENDENT_AMBULATORY_CARE_PROVIDER_SITE_OTHER): Payer: BC Managed Care – PPO | Admitting: Physician Assistant

## 2020-10-01 VITALS — BP 126/80 | HR 90 | Temp 97.5°F | Ht 65.0 in | Wt 171.0 lb

## 2020-10-01 DIAGNOSIS — E785 Hyperlipidemia, unspecified: Secondary | ICD-10-CM | POA: Diagnosis not present

## 2020-10-01 DIAGNOSIS — E669 Obesity, unspecified: Secondary | ICD-10-CM | POA: Diagnosis not present

## 2020-10-01 DIAGNOSIS — F3289 Other specified depressive episodes: Secondary | ICD-10-CM | POA: Diagnosis not present

## 2020-10-01 DIAGNOSIS — E1169 Type 2 diabetes mellitus with other specified complication: Secondary | ICD-10-CM

## 2020-10-01 DIAGNOSIS — Z9189 Other specified personal risk factors, not elsewhere classified: Secondary | ICD-10-CM

## 2020-10-01 DIAGNOSIS — Z6830 Body mass index (BMI) 30.0-30.9, adult: Secondary | ICD-10-CM

## 2020-10-01 MED ORDER — METFORMIN HCL 500 MG PO TABS: 500.0000 mg | ORAL_TABLET | ORAL | 0 refills | Status: DC

## 2020-10-01 MED ORDER — BUPROPION HCL ER (SR) 200 MG PO TB12: 200.0000 mg | ORAL_TABLET | Freq: Two times a day (BID) | ORAL | 0 refills | Status: DC

## 2020-10-01 NOTE — Telephone Encounter (Signed)
Prescription sent to pharmacy. Takisha Pelle, CMA

## 2020-10-02 NOTE — Progress Notes (Signed)
Chief Complaint:   OBESITY Jillian Green is here to discuss her progress with her obesity treatment plan along with follow-up of her obesity related diagnoses. Jillian Green is on the Category 3 Plan and states she is following her eating plan approximately 60% of the time. Jillian Green states she is doing 0 minutes 0 times per week.  Today's visit was #: 30 Starting weight: 184 lbs Starting date: 04/29/2018 Today's weight: 171 lbs Today's date: 10/01/2020 Total lbs lost to date: 13 Total lbs lost since last in-office visit: 0  Interim History: Jillian Green reports that she has not done well with the plan. She is having terrible sweet cravings. She feels like she needs a "reset".  Subjective:   1. Type 2 diabetes mellitus with hyperlipidemia (HCC) Jillian Green's fasting BGs range between 138 and 164. She is on Jardiance and metformin.  2. Other depression, with emotional eating  Jillian Green reports cravings. She is on bupropion 150 mg BID. She denies suicidal or homicidal ideas.  3. At risk for hypoglycemia Jillian Green is at increased risk for hypoglycemia due to changes in diet, diagnosis of diabetes, and/or insulin use.    Assessment/Plan:   1. Type 2 diabetes mellitus with hyperlipidemia (HCC) Good blood sugar control is important to decrease the likelihood of diabetic complications such as nephropathy, neuropathy, limb loss, blindness, coronary artery disease, and death. Intensive lifestyle modification including diet, exercise and weight loss are the first line of treatment for diabetes. Jillian Green will continue her medications and weight loss.  2. Other depression, with emotional eating  Behavior modification techniques were discussed today to help Jillian Green deal with her emotional/non-hunger eating behaviors. Jillian Green agreed to increase bupropion to 200 mg BID and we will refill for 90 days with no refills. Orders and follow up as documented in patient record.   3. At risk for hypoglycemia Jillian Green was given approximately 15  minutes of counseling today regarding prevention of hypoglycemia. She was advised of symptoms of hypoglycemia. Jillian Green was instructed to avoid skipping meals, eat regular protein rich meals and schedule low calorie snacks as needed.   Repetitive spaced learning was employed today to elicit superior memory formation and behavioral change  4. Class 1 obesity with serious comorbidity and body mass index (BMI) of 30.0 to 30.9 in adult, unspecified obesity type Jillian Green is currently in the action stage of change. As such, her goal is to continue with weight loss efforts. She has agreed to following a lower carbohydrate, vegetable and lean protein rich diet plan.   We will recheck fasting labs at her next visit.  Exercise goals: No exercise has been prescribed at this time.  Behavioral modification strategies: decreasing simple carbohydrates and better snacking choices.  Jillian Green has agreed to follow-up with our clinic in 7 to 8 weeks. She was informed of the importance of frequent follow-up visits to maximize her success with intensive lifestyle modifications for her multiple health conditions.   Objective:   Blood pressure 126/80, pulse 90, temperature (!) 97.5 F (36.4 C), height 5\' 5"  (1.651 m), weight 171 lb (77.6 kg), SpO2 99 %. Body mass index is 28.46 kg/m.  General: Cooperative, alert, well developed, in no acute distress. HEENT: Conjunctivae and lids unremarkable. Cardiovascular: Regular rhythm.  Lungs: Normal work of breathing. Neurologic: No focal deficits.   Lab Results  Component Value Date   CREATININE 0.76 06/18/2020   BUN 14 06/18/2020   NA 143 06/18/2020   K 4.3 06/18/2020   CL 105 06/18/2020   CO2 24 06/18/2020  Lab Results  Component Value Date   ALT 26 06/18/2020   AST 14 06/18/2020   ALKPHOS 106 06/18/2020   BILITOT 0.4 06/18/2020   Lab Results  Component Value Date   HGBA1C 5.8 (H) 06/18/2020   HGBA1C 6.0 (H) 02/17/2020   HGBA1C 5.9 (H) 08/22/2019   HGBA1C  5.8 (H) 03/11/2019   HGBA1C 7.1 (H) 09/13/2018   Lab Results  Component Value Date   INSULIN 14.0 06/18/2020   INSULIN 9.9 02/17/2020   INSULIN 13.8 08/22/2019   INSULIN 7.6 03/11/2019   INSULIN 15.9 09/13/2018   Lab Results  Component Value Date   TSH 0.770 04/29/2018   Lab Results  Component Value Date   CHOL 139 06/18/2020   HDL 60 06/18/2020   LDLCALC 66 06/18/2020   TRIG 65 06/18/2020   CHOLHDL 2.3 06/18/2020   Lab Results  Component Value Date   WBC 6.8 04/29/2018   HGB 13.7 04/29/2018   HCT 42.3 04/29/2018   MCV 95 04/29/2018   PLT 305 10/23/2016   Lab Results  Component Value Date   IRON 156 (H) 09/23/2013   FERRITIN 103.5 09/23/2013   Attestation Statements:   Reviewed by clinician on day of visit: allergies, medications, problem list, medical history, surgical history, family history, social history, and previous encounter notes.   Trude Mcburney, am acting as transcriptionist for [employer], PA-C.  I have reviewed the above documentation for accuracy and completeness, and I agree with the above. Alois Cliche, PA-C

## 2020-10-11 ENCOUNTER — Other Ambulatory Visit: Payer: Self-pay | Admitting: Family Medicine

## 2020-10-11 DIAGNOSIS — J302 Other seasonal allergic rhinitis: Secondary | ICD-10-CM

## 2020-10-26 ENCOUNTER — Other Ambulatory Visit (INDEPENDENT_AMBULATORY_CARE_PROVIDER_SITE_OTHER): Payer: Self-pay | Admitting: Physician Assistant

## 2020-10-26 DIAGNOSIS — F3289 Other specified depressive episodes: Secondary | ICD-10-CM

## 2020-10-26 DIAGNOSIS — E1169 Type 2 diabetes mellitus with other specified complication: Secondary | ICD-10-CM

## 2020-10-26 DIAGNOSIS — E785 Hyperlipidemia, unspecified: Secondary | ICD-10-CM

## 2020-11-11 ENCOUNTER — Encounter (INDEPENDENT_AMBULATORY_CARE_PROVIDER_SITE_OTHER): Payer: Self-pay

## 2020-11-12 ENCOUNTER — Ambulatory Visit (INDEPENDENT_AMBULATORY_CARE_PROVIDER_SITE_OTHER): Payer: BC Managed Care – PPO | Admitting: Physician Assistant

## 2020-11-24 ENCOUNTER — Other Ambulatory Visit (INDEPENDENT_AMBULATORY_CARE_PROVIDER_SITE_OTHER): Payer: Self-pay | Admitting: Physician Assistant

## 2020-11-24 DIAGNOSIS — E785 Hyperlipidemia, unspecified: Secondary | ICD-10-CM

## 2020-11-24 DIAGNOSIS — E1169 Type 2 diabetes mellitus with other specified complication: Secondary | ICD-10-CM

## 2020-11-26 NOTE — Telephone Encounter (Signed)
Jillian Green 

## 2020-11-27 ENCOUNTER — Encounter (INDEPENDENT_AMBULATORY_CARE_PROVIDER_SITE_OTHER): Payer: Self-pay | Admitting: Physician Assistant

## 2020-11-27 NOTE — Telephone Encounter (Signed)
Ok to send    thanks

## 2020-11-27 NOTE — Telephone Encounter (Signed)
Refill request

## 2020-11-28 ENCOUNTER — Other Ambulatory Visit (INDEPENDENT_AMBULATORY_CARE_PROVIDER_SITE_OTHER): Payer: Self-pay

## 2020-11-28 DIAGNOSIS — E785 Hyperlipidemia, unspecified: Secondary | ICD-10-CM

## 2020-11-28 DIAGNOSIS — E1169 Type 2 diabetes mellitus with other specified complication: Secondary | ICD-10-CM

## 2020-11-28 MED ORDER — ATORVASTATIN CALCIUM 10 MG PO TABS: 10.0000 mg | ORAL_TABLET | Freq: Every day | ORAL | 0 refills | Status: DC

## 2020-12-07 ENCOUNTER — Other Ambulatory Visit (INDEPENDENT_AMBULATORY_CARE_PROVIDER_SITE_OTHER): Payer: Self-pay | Admitting: Physician Assistant

## 2020-12-07 DIAGNOSIS — E1169 Type 2 diabetes mellitus with other specified complication: Secondary | ICD-10-CM

## 2020-12-07 DIAGNOSIS — E785 Hyperlipidemia, unspecified: Secondary | ICD-10-CM

## 2020-12-09 ENCOUNTER — Encounter (INDEPENDENT_AMBULATORY_CARE_PROVIDER_SITE_OTHER): Payer: Self-pay | Admitting: Physician Assistant

## 2020-12-10 NOTE — Telephone Encounter (Signed)
Pt last seen by Tracey Aguilar, PA-C.  

## 2020-12-11 NOTE — Telephone Encounter (Signed)
I just sent in a refill yesterday for jardiance. thanks

## 2020-12-23 ENCOUNTER — Encounter (INDEPENDENT_AMBULATORY_CARE_PROVIDER_SITE_OTHER): Payer: Self-pay | Admitting: Physician Assistant

## 2020-12-24 ENCOUNTER — Other Ambulatory Visit (INDEPENDENT_AMBULATORY_CARE_PROVIDER_SITE_OTHER): Payer: Self-pay | Admitting: Physician Assistant

## 2020-12-24 ENCOUNTER — Other Ambulatory Visit (INDEPENDENT_AMBULATORY_CARE_PROVIDER_SITE_OTHER): Payer: Self-pay

## 2020-12-24 ENCOUNTER — Encounter: Payer: Self-pay | Admitting: Family Medicine

## 2020-12-24 DIAGNOSIS — E1169 Type 2 diabetes mellitus with other specified complication: Secondary | ICD-10-CM

## 2020-12-24 DIAGNOSIS — E785 Hyperlipidemia, unspecified: Secondary | ICD-10-CM

## 2020-12-24 DIAGNOSIS — F988 Other specified behavioral and emotional disorders with onset usually occurring in childhood and adolescence: Secondary | ICD-10-CM

## 2020-12-24 MED ORDER — LISDEXAMFETAMINE DIMESYLATE 50 MG PO CAPS: 50.0000 mg | ORAL_CAPSULE | Freq: Every day | ORAL | 0 refills | Status: DC

## 2020-12-24 MED ORDER — METFORMIN HCL 500 MG PO TABS: 500.0000 mg | ORAL_TABLET | ORAL | 0 refills | Status: DC

## 2020-12-24 NOTE — Telephone Encounter (Signed)
Refill request, pt has appt 6/20

## 2020-12-24 NOTE — Telephone Encounter (Signed)
Ok to refill. Please find out where she wants it sent, as it looks like she has multiple pharmacies on file. Thanks

## 2020-12-24 NOTE — Telephone Encounter (Signed)
Requesting: Vyvanse  Contract: 02/02/2020 UDS: 02/02/2020 Last OV: 07/11/2020 Next OV: N/A Last Refill: 08/27/2020, #30--0RF Database:   Please advise

## 2020-12-24 NOTE — Telephone Encounter (Signed)
Pt last seen by Tracey Aguilar, PA-C.  

## 2020-12-28 ENCOUNTER — Other Ambulatory Visit (INDEPENDENT_AMBULATORY_CARE_PROVIDER_SITE_OTHER): Payer: Self-pay | Admitting: Physician Assistant

## 2020-12-28 DIAGNOSIS — F3289 Other specified depressive episodes: Secondary | ICD-10-CM

## 2021-01-01 NOTE — Telephone Encounter (Signed)
Last seen by Vernona Rieger

## 2021-01-15 ENCOUNTER — Other Ambulatory Visit (INDEPENDENT_AMBULATORY_CARE_PROVIDER_SITE_OTHER): Payer: Self-pay | Admitting: Physician Assistant

## 2021-01-15 DIAGNOSIS — E785 Hyperlipidemia, unspecified: Secondary | ICD-10-CM

## 2021-01-15 DIAGNOSIS — E1169 Type 2 diabetes mellitus with other specified complication: Secondary | ICD-10-CM

## 2021-01-20 ENCOUNTER — Encounter (INDEPENDENT_AMBULATORY_CARE_PROVIDER_SITE_OTHER): Payer: Self-pay

## 2021-01-21 ENCOUNTER — Ambulatory Visit (INDEPENDENT_AMBULATORY_CARE_PROVIDER_SITE_OTHER): Payer: BC Managed Care – PPO | Admitting: Physician Assistant

## 2021-01-23 ENCOUNTER — Encounter: Payer: Self-pay | Admitting: Family Medicine

## 2021-01-23 ENCOUNTER — Encounter (INDEPENDENT_AMBULATORY_CARE_PROVIDER_SITE_OTHER): Payer: Self-pay | Admitting: Physician Assistant

## 2021-01-23 ENCOUNTER — Other Ambulatory Visit (INDEPENDENT_AMBULATORY_CARE_PROVIDER_SITE_OTHER): Payer: Self-pay | Admitting: Physician Assistant

## 2021-01-23 DIAGNOSIS — E1169 Type 2 diabetes mellitus with other specified complication: Secondary | ICD-10-CM

## 2021-01-24 ENCOUNTER — Encounter (INDEPENDENT_AMBULATORY_CARE_PROVIDER_SITE_OTHER): Payer: Self-pay | Admitting: Physician Assistant

## 2021-01-24 NOTE — Telephone Encounter (Signed)
Please review

## 2021-01-28 MED ORDER — METFORMIN HCL 500 MG PO TABS: 500.0000 mg | ORAL_TABLET | ORAL | 0 refills | Status: DC

## 2021-01-28 NOTE — Telephone Encounter (Signed)
Pt last seen by Tracey Aguilar, PA-C.  

## 2021-01-28 NOTE — Telephone Encounter (Signed)
Ok to fill? TH ok? Appt 02/18/21  Last filled 12/24/20 A1c was 5.8 on 06/18/20

## 2021-01-28 NOTE — Telephone Encounter (Signed)
Yes, ok to RF. Thanks

## 2021-02-01 ENCOUNTER — Ambulatory Visit: Payer: BC Managed Care – PPO | Admitting: Family Medicine

## 2021-02-01 ENCOUNTER — Other Ambulatory Visit: Payer: Self-pay

## 2021-02-01 ENCOUNTER — Encounter: Payer: Self-pay | Admitting: Family Medicine

## 2021-02-01 VITALS — BP 98/60 | HR 98 | Temp 97.8°F | Resp 18 | Ht 65.0 in | Wt 169.6 lb

## 2021-02-01 DIAGNOSIS — F988 Other specified behavioral and emotional disorders with onset usually occurring in childhood and adolescence: Secondary | ICD-10-CM

## 2021-02-01 DIAGNOSIS — M545 Low back pain, unspecified: Secondary | ICD-10-CM | POA: Diagnosis not present

## 2021-02-01 DIAGNOSIS — J302 Other seasonal allergic rhinitis: Secondary | ICD-10-CM | POA: Diagnosis not present

## 2021-02-01 DIAGNOSIS — G8929 Other chronic pain: Secondary | ICD-10-CM

## 2021-02-01 DIAGNOSIS — F419 Anxiety disorder, unspecified: Secondary | ICD-10-CM

## 2021-02-01 MED ORDER — LISDEXAMFETAMINE DIMESYLATE 50 MG PO CAPS: 50.0000 mg | ORAL_CAPSULE | Freq: Every day | ORAL | 0 refills | Status: DC

## 2021-02-01 MED ORDER — ALPRAZOLAM 0.5 MG PO TABS: 0.5000 mg | ORAL_TABLET | Freq: Three times a day (TID) | ORAL | 1 refills | Status: DC | PRN

## 2021-02-01 MED ORDER — MELOXICAM 15 MG PO TABS: ORAL_TABLET | ORAL | 2 refills | Status: DC

## 2021-02-01 MED ORDER — LEVOCETIRIZINE DIHYDROCHLORIDE 5 MG PO TABS: 5.0000 mg | ORAL_TABLET | Freq: Every evening | ORAL | 3 refills | Status: DC

## 2021-02-01 NOTE — Assessment & Plan Note (Signed)
Stable con't meds F/u 6 months 

## 2021-02-01 NOTE — Progress Notes (Signed)
Patient ID: Jillian Green, female    DOB: May 19, 1971  Age: 50 y.o. MRN: [id number]    Subjective:  Subjective  HPI Jillian Green presents for an office visit and medication evaluation today. She complains of chronic back pain. She notes that she had taken an old 15 mg meloxicam for back pain and it help greatly. She notes that she is moving and is selling her house. She states that she has been packing resulting in the back pain. She endorses taking 50 mg of Vyvanse PO Daily for her dx of ADHD.  She reports taking 60 mg CYMBALTA BID Daily and 0.5 mg xanax 3 times per day for her dx of anxiety.  She denies any chest pain, SOB, fever, abdominal pain, cough, chills, sore throat, dysuria, urinary incontinence, HA, or N/V/D at this time.   Review of Systems  Constitutional:  Negative for chills, fatigue and fever.  HENT:  Negative for ear pain, rhinorrhea, sinus pressure, sinus pain, sore throat and tinnitus.   Eyes:  Negative for pain.  Respiratory:  Negative for cough, shortness of breath and wheezing.   Cardiovascular:  Negative for chest pain.  Gastrointestinal:  Negative for abdominal pain, anal bleeding, constipation, diarrhea, nausea and vomiting.  Genitourinary:  Negative for flank pain.  Musculoskeletal:  Positive for back pain (chronic). Negative for neck pain.  Skin:  Negative for rash.  Neurological:  Negative for seizures, weakness, light-headedness, numbness and headaches.   History Past Medical History:  Diagnosis Date   ADD (attention deficit disorder)    Anxiety    Back pain    Chronic headaches    Depression    Fibromyalgia    GERD (gastroesophageal reflux disease)    Itchy skin    Joint pain    Lactose intolerance    Migraine    Sweating increase    Type 2 diabetes mellitus without complication, without long-term current use of insulin ([place]) 09/13/2018   Unspecified eustachian tube disorder     She has a past surgical history that includes Sinus  exploration; Cesarean section; and OTHER SURGICAL HISTORY.   Her family history includes ADD / ADHD in her sister; Alcoholism in her father; Anxiety disorder in her mother; Heart disease in her father; Hypertension in her father and mother; Obesity in her mother; Other in her father; Sleep apnea in her mother; Thyroid disease in her mother.She reports that she quit smoking about 14 years ago. She has never used smokeless tobacco. She reports current alcohol use of about 1.0 standard drink of alcohol per week. She reports that she does not use drugs.  Current Outpatient Medications on File Prior to Visit  Medication Sig Dispense Refill   atorvastatin (LIPITOR) 10 MG tablet Take 1 tablet (10 mg total) by mouth daily. 90 tablet 0   azelastine (OPTIVAR) 0.05 % ophthalmic solution Place 1 drop into both eyes 2 (two) times daily as needed. 18 mL 1   BD PEN NEEDLE NANO U/F 32G X 4 MM MISC USE TWICE A DAY 100 each 0   blood glucose meter kit and supplies Dispense based on patient and insurance preference. Use up to four times daily as directed. (FOR ICD-10 E10.9, E11.9). 1 each 0   buPROPion (WELLBUTRIN SR) 200 MG 12 hr tablet TAKE 1 TABLET BY MOUTH TWICE A DAY 180 tablet 0   DULoxetine (CYMBALTA) 60 MG capsule Take 1 capsule (60 mg total) by mouth 2 (two) times daily. 180 capsule 1   fluticasone (FLONASE)  50 MCG/ACT nasal spray Place 2 sprays into both nostrils daily. 16 g 6   glucose blood (ONETOUCH ULTRA) test strip USE AS INSTRUCTED 100 strip 0   hydrOXYzine (ATARAX/VISTARIL) 25 MG tablet TAKE 1 TO 2 TABLETS BY MOUTH AT BEDTIME AS NEEDED FOR SLEEP 180 tablet 1   JARDIANCE 25 MG TABS tablet TAKE 1 TABLET (25 MG TOTAL) BY MOUTH DAILY. 90 tablet 0   lisdexamfetamine (VYVANSE) 50 MG capsule Take 1 capsule (50 mg total) by mouth daily. 30 capsule 0   metFORMIN (GLUCOPHAGE) 500 MG tablet Take 1 tablet (500 mg total) by mouth as directed. Take two tabs by mouth twice daily 120 tablet 0   Naftifine HCl  (NAFTIN) 2 % CREA Apply to affected area qd 60 g 1   NONFORMULARY OR COMPOUNDED ITEM Tiger balm patches 1 patch daily prn 30 each 5   OneTouch Delica Lancets 57Q MISC 1 Device by Does not apply route daily. 100 each 0   rizatriptan (MAXALT-MLT) 10 MG disintegrating tablet Take 1 tablet (10 mg total) by mouth as needed for migraine. May repeat in 2 hours if needed 10 tablet 4   No current facility-administered medications on file prior to visit.     Objective:  Objective  Physical Exam Vitals and nursing note reviewed.  Constitutional:      General: She is not in acute distress.    Appearance: Normal appearance. She is well-developed. She is not ill-appearing.  HENT:     Head: Normocephalic and atraumatic.     Right Ear: External ear normal.     Left Ear: External ear normal.     Nose: Nose normal.  Eyes:     General:        Right eye: No discharge.        Left eye: No discharge.     Extraocular Movements: Extraocular movements intact.     Pupils: Pupils are equal, round, and reactive to light.  Cardiovascular:     Rate and Rhythm: Normal rate and regular rhythm.     Pulses: Normal pulses.     Heart sounds: Normal heart sounds. No murmur heard.   No friction rub. No gallop.  Pulmonary:     Effort: Pulmonary effort is normal. No respiratory distress.     Breath sounds: Normal breath sounds. No stridor. No wheezing, rhonchi or rales.  Chest:     Chest wall: No tenderness.  Abdominal:     General: Bowel sounds are normal. There is no distension.     Palpations: Abdomen is soft. There is no mass.     Tenderness: There is no abdominal tenderness. There is no guarding or rebound.     Hernia: No hernia is present.  Musculoskeletal:        General: Normal range of motion.     Cervical back: Normal range of motion and neck supple.     Right lower leg: No edema.     Left lower leg: No edema.  Skin:    General: Skin is warm and dry.  Neurological:     Mental Status: She is alert  and oriented to person, place, and time.  Psychiatric:        Behavior: Behavior normal.        Thought Content: Thought content normal.   BP 98/60 (BP Location: Right Arm, Patient Position: Sitting, Cuff Size: Normal)   Pulse 98   Temp 97.8 F (36.6 C) (Oral)   Resp 18   Ht  5' 5"  (1.651 m)   Wt 169 lb 9.6 oz (76.9 kg)   SpO2 98%   BMI 28.22 kg/m  Wt Readings from Last 3 Encounters:  02/01/21 169 lb 9.6 oz (76.9 kg)  10/01/20 171 lb (77.6 kg)  08/06/20 168 lb (76.2 kg)     Lab Results  Component Value Date   WBC 6.8 04/29/2018   HGB 13.7 04/29/2018   HCT 42.3 04/29/2018   PLT 305 10/23/2016   GLUCOSE 127 (H) 06/18/2020   CHOL 139 06/18/2020   TRIG 65 06/18/2020   HDL 60 06/18/2020   LDLCALC 66 06/18/2020   ALT 26 06/18/2020   AST 14 06/18/2020   NA 143 06/18/2020   K 4.3 06/18/2020   CL 105 06/18/2020   CREATININE 0.76 06/18/2020   BUN 14 06/18/2020   CO2 24 06/18/2020   TSH 0.770 04/29/2018   HGBA1C 5.8 (H) 06/18/2020    MM 3D SCREEN BREAST BILATERAL  Result Date: 08/05/2018 CLINICAL DATA:  Screening. EXAM: DIGITAL SCREENING BILATERAL MAMMOGRAM WITH TOMO AND CAD COMPARISON:  Previous exam(s). ACR Breast Density Category b: There are scattered areas of fibroglandular density. FINDINGS: There are no findings suspicious for malignancy. Images were processed with CAD. IMPRESSION: No mammographic evidence of malignancy. A result letter of this screening mammogram will be mailed directly to the patient. RECOMMENDATION: Screening mammogram in one year. (Code:SM-B-01Y) BI-RADS CATEGORY  1: Negative. Electronically Signed   By: Claudie Revering M.D.   On: 08/05/2018 15:00     Assessment & Plan:  Plan   Meds ordered this encounter  Medications   ALPRAZolam (XANAX) 0.5 MG tablet    Sig: Take 1 tablet (0.5 mg total) by mouth 3 (three) times daily as needed for anxiety.    Dispense:  45 tablet    Refill:  1   levocetirizine (XYZAL) 5 MG tablet    Sig: Take 1 tablet (5 mg  total) by mouth every evening.    Dispense:  90 tablet    Refill:  3   lisdexamfetamine (VYVANSE) 50 MG capsule    Sig: Take 1 capsule (50 mg total) by mouth daily.    Dispense:  30 capsule    Refill:  0    Do not fill until sept 2022   lisdexamfetamine (VYVANSE) 50 MG capsule    Sig: Take 1 capsule (50 mg total) by mouth daily.    Dispense:  30 capsule    Refill:  0    Do not fill until August 2022   lisdexamfetamine (VYVANSE) 50 MG capsule    Sig: Take 1 capsule (50 mg total) by mouth daily.    Dispense:  30 capsule    Refill:  0   meloxicam (MOBIC) 15 MG tablet    Sig: 1/2 -1 po qd prn    Dispense:  30 tablet    Refill:  2    Problem List Items Addressed This Visit       Unprioritized   Anxiety   Relevant Medications   ALPRAZolam (XANAX) 0.5 MG tablet   Other Relevant Orders   DRUG MONITORING, PANEL 8 WITH CONFIRMATION, URINE   Seasonal allergies   Relevant Medications   levocetirizine (XYZAL) 5 MG tablet   Attention deficit disorder   Relevant Medications   lisdexamfetamine (VYVANSE) 50 MG capsule   lisdexamfetamine (VYVANSE) 50 MG capsule   lisdexamfetamine (VYVANSE) 50 MG capsule   Other Relevant Orders   DRUG MONITORING, PANEL 8 WITH CONFIRMATION, URINE   ADD (  attention deficit disorder)    Stable con't meds F/u 6 months        Relevant Medications   lisdexamfetamine (VYVANSE) 50 MG capsule   lisdexamfetamine (VYVANSE) 50 MG capsule   lisdexamfetamine (VYVANSE) 50 MG capsule   Other Relevant Orders   DRUG MONITORING, PANEL 8 WITH CONFIRMATION, URINE   Other Visit Diagnoses     Chronic low back pain, unspecified back pain laterality, unspecified whether sciatica present    -  Primary   Relevant Medications   meloxicam (MOBIC) 15 MG tablet   Other Relevant Orders   DRUG MONITORING, PANEL 8 WITH CONFIRMATION, URINE       Follow-up: Return in about 6 months (around 08/04/2021), or if symptoms worsen or fail to improve.   I,Gordon Zheng,acting as  a Education administrator for [employer], DO.,have documented all relevant documentation on the behalf of Ann Held, DO,as directed by  Ann Held, DO while in the presence of [place], DO, have reviewed all documentation for this visit. The documentation on 02/01/21 for the exam, diagnosis, procedures, and orders are all accurate and complete.

## 2021-02-01 NOTE — Patient Instructions (Signed)
Attention Deficit Hyperactivity Disorder, Adult Attention deficit hyperactivity disorder (ADHD) is a mental health disorder that starts during childhood (neurodevelopmental disorder). For many people with ADHD, the disorder continues into the adult years.Treatment can help you manage your symptoms. What are the causes? The exact cause of ADHD is not known. Most experts believe genetics andenvironmental factors contribute to ADHD. What increases the risk? The following factors may make you more likely to develop this condition: Having a family history of ADHD. Being female. Being born to a mother who smoked or drank alcohol during pregnancy. Being exposed to lead or other toxins in the womb or early in life. Being born before 37 weeks of pregnancy (prematurely) or at a low birth weight. Having experienced a brain injury. What are the signs or symptoms? Symptoms of this condition depend on the type of ADHD. The two main types are inattentive and hyperactive-impulsive. Some people may have symptoms of bothtypes. Symptoms of the inattentive type include: Difficulty paying attention. Making careless mistakes. Not following instructions. Being disorganized. Avoiding tasks that require time and attention. Losing and forgetting things. Being easily distracted. Symptoms of the hyperactive-impulsive type include: Restlessness. Talking too much. Interrupting. Difficulty with: Sitting still. Feeling motivated. Relaxing. Waiting in line or waiting for a turn. In adults, this condition may lead to certain problems, such as: Keeping jobs. Performing tasks at work. Having stable relationships. Being on time or keeping to a schedule. How is this diagnosed? This condition is diagnosed based on your current symptoms and your history of symptoms. The diagnosis can be made by a health care provider such as a primarycare provider or a mental health care specialist. Your health care provider may use a  symptom checklist or a behavior rating scale to evaluate your symptoms. He or she may also want to talk with peoplewho have observed your behaviors throughout your life. How is this treated? This condition can be treated with medicines and behavior therapy. Medicines may be the best option to reduce impulsive behaviors and improve attention. Your health care provider may recommend: Stimulant medicines. These are the most common medicines used for adult ADHD. They affect certain chemicals in the brain (neurotransmitters) and improve your ability to control your symptoms. A non-stimulant medicine for adult ADHD (atomoxetine). This medicine increases a neurotransmitter called norepinephrine. It may take weeks to months to see effects from this medicine. Counseling and behavioral management are also important for treating ADHD. Counseling is often used along with medicine. Your health care provider may suggest: Cognitive behavioral therapy (CBT). This type of therapy teaches you to replace negative thoughts and actions with positive thoughts and actions. When used as part of ADHD treatment, this therapy may also include: Coping strategies for organization, time management, impulse control, and stress reduction. Mindfulness and meditation training. Behavioral management. You may work with a coach who is specially trained to help people with ADHD manage and organize activities and function more effectively. Follow these instructions at home: Medicines  Take over-the-counter and prescription medicines only as told by your health care provider. Talk with your health care provider about the possible side effects of your medicines and how to manage them.  Lifestyle  Do not use drugs. Do not drink alcohol if: Your health care provider tells you not to drink. You are pregnant, may be pregnant, or are planning to become pregnant. If you drink alcohol: Limit how much you use to: 0-1 drink a day for  women. 0-2 drinks a day for men. Be aware   of how much alcohol is in your drink. In the [place] one drink equals one 12 oz bottle of beer (355 mL), one 5 oz glass of wine (148 mL), or one 1 oz glass of hard liquor (44 mL). Get enough sleep. Eat a healthy diet. Exercise regularly. Exercise can help to reduce stress and anxiety.  General instructions Learn as much as you can about adult ADHD, and work closely with your health care providers to find the treatments that work best for you. Follow the same schedule each day. Use reminder devices like notes, calendars, and phone apps to stay on time and organized. Keep all follow-up visits as told by your health care provider and therapist. This is important. Where to find more information A health care provider may be able to recommend resources that are available online or over the phone. You could start with: Attention Deficit Disorder Association (ADDA): www.add.org National Institute of Mental Health (NIMH): www.nimh.nih.gov Contact a health care provider if: Your symptoms continue to cause problems. You have side effects from your medicine, such as: Repeated muscle twitches, coughing, or speech outbursts. Sleep problems. Loss of appetite. Dizziness. Unusually fast heartbeat. Stomach pains. Headaches. You are struggling with anxiety, depression, or substance abuse. Get help right away if you: Have a severe reaction to a medicine. If you ever feel like you may hurt yourself or others, or have thoughts about taking your own life, get help right away. You can go to the nearest emergency department or call: Your local emergency services (911 in the [place] A suicide crisis helpline, such as the National Suicide Prevention Lifeline at 1-800-273-8255. This is open 24 hours a day. Summary ADHD is a mental health disorder that starts during childhood (neurodevelopmental disorder) and often continues into the adult years. The exact cause of ADHD  is not known. Most experts believe genetics and environmental factors contribute to ADHD. There is no cure for ADHD, but treatment with medicine, cognitive behavioral therapy, or behavioral management can help you manage your condition. This information is not intended to replace advice given to you by your health care provider. Make sure you discuss any questions you have with your healthcare provider. Document Revised: 12/13/2018 Document Reviewed: 12/13/2018 Elsevier Patient Education  2022 Elsevier Inc.  

## 2021-02-02 ENCOUNTER — Other Ambulatory Visit: Payer: Self-pay | Admitting: Family Medicine

## 2021-02-02 DIAGNOSIS — J302 Other seasonal allergic rhinitis: Secondary | ICD-10-CM

## 2021-02-13 LAB — DRUG MONITORING, PANEL 8 WITH CONFIRMATION, URINE
6 Acetylmorphine: NEGATIVE ng/mL (ref <10)
Alcohol Metabolites: POSITIVE ng/mL — AB (ref <500)
Amphetamine: 331 ng/mL — ABNORMAL HIGH (ref <250)
Amphetamines: POSITIVE ng/mL — AB (ref <500)
Benzodiazepines: NEGATIVE ng/mL (ref <100)
Buprenorphine, Urine: NEGATIVE ng/mL (ref <5)
Cocaine Metabolite: NEGATIVE ng/mL (ref <150)
Creatinine: 63.5 mg/dL (ref >=20.0)
Ethyl Glucuronide (ETG): 2871 ng/mL — ABNORMAL HIGH (ref <500)
Ethyl Sulfate (ETS): 848 ng/mL — ABNORMAL HIGH (ref <100)
MDMA: NEGATIVE ng/mL (ref <500)
Marijuana Metabolite: NEGATIVE ng/mL (ref <20)
Methamphetamine: NEGATIVE ng/mL (ref <250)
Opiates: NEGATIVE ng/mL (ref <100)
Oxidant: NEGATIVE ug/mL (ref <200)
Oxycodone: NEGATIVE ng/mL (ref <100)
pH: 6 (ref 4.5–9.0)

## 2021-02-13 LAB — DM TEMPLATE

## 2021-02-18 ENCOUNTER — Encounter (INDEPENDENT_AMBULATORY_CARE_PROVIDER_SITE_OTHER): Payer: Self-pay | Admitting: Physician Assistant

## 2021-02-18 ENCOUNTER — Other Ambulatory Visit: Payer: Self-pay

## 2021-02-18 ENCOUNTER — Ambulatory Visit (INDEPENDENT_AMBULATORY_CARE_PROVIDER_SITE_OTHER): Payer: BC Managed Care – PPO | Admitting: Physician Assistant

## 2021-02-18 VITALS — BP 111/72 | HR 87 | Temp 98.0°F | Ht 65.0 in | Wt 167.0 lb

## 2021-02-18 DIAGNOSIS — Z9189 Other specified personal risk factors, not elsewhere classified: Secondary | ICD-10-CM | POA: Diagnosis not present

## 2021-02-18 DIAGNOSIS — E785 Hyperlipidemia, unspecified: Secondary | ICD-10-CM

## 2021-02-18 DIAGNOSIS — E669 Obesity, unspecified: Secondary | ICD-10-CM | POA: Diagnosis not present

## 2021-02-18 DIAGNOSIS — E1169 Type 2 diabetes mellitus with other specified complication: Secondary | ICD-10-CM | POA: Diagnosis not present

## 2021-02-18 DIAGNOSIS — E7849 Other hyperlipidemia: Secondary | ICD-10-CM

## 2021-02-18 DIAGNOSIS — Z6830 Body mass index (BMI) 30.0-30.9, adult: Secondary | ICD-10-CM

## 2021-02-18 DIAGNOSIS — E559 Vitamin D deficiency, unspecified: Secondary | ICD-10-CM

## 2021-02-18 MED ORDER — METFORMIN HCL 500 MG PO TABS: 500.0000 mg | ORAL_TABLET | ORAL | 0 refills | Status: DC

## 2021-02-18 MED ORDER — EMPAGLIFLOZIN 25 MG PO TABS: 25.0000 mg | ORAL_TABLET | Freq: Every day | ORAL | 0 refills | Status: DC

## 2021-02-18 MED ORDER — ATORVASTATIN CALCIUM 10 MG PO TABS: 10.0000 mg | ORAL_TABLET | Freq: Every day | ORAL | 0 refills | Status: DC

## 2021-02-19 ENCOUNTER — Other Ambulatory Visit (INDEPENDENT_AMBULATORY_CARE_PROVIDER_SITE_OTHER): Payer: Self-pay | Admitting: Physician Assistant

## 2021-02-19 DIAGNOSIS — E1169 Type 2 diabetes mellitus with other specified complication: Secondary | ICD-10-CM

## 2021-02-19 LAB — COMPREHENSIVE METABOLIC PANEL
ALT: 19 IU/L (ref 0–32)
AST: 15 IU/L (ref 0–40)
Albumin/Globulin Ratio: 2.1 (ref 1.2–2.2)
Albumin: 4.4 g/dL (ref 3.8–4.8)
Alkaline Phosphatase: 127 IU/L — ABNORMAL HIGH (ref 44–121)
BUN/Creatinine Ratio: 15 (ref 9–23)
BUN: 12 mg/dL (ref 6–24)
Bilirubin Total: 0.3 mg/dL (ref 0.0–1.2)
CO2: 26 mmol/L (ref 20–29)
Calcium: 9.4 mg/dL (ref 8.7–10.2)
Chloride: 102 mmol/L (ref 96–106)
Creatinine, Ser: 0.81 mg/dL (ref 0.57–1.00)
Globulin, Total: 2.1 g/dL (ref 1.5–4.5)
Glucose: 136 mg/dL — ABNORMAL HIGH (ref 65–99)
Potassium: 4.5 mmol/L (ref 3.5–5.2)
Sodium: 142 mmol/L (ref 134–144)
Total Protein: 6.5 g/dL (ref 6.0–8.5)
eGFR: 89 mL/min/{1.73_m2} (ref >59)

## 2021-02-19 LAB — LIPID PANEL
Chol/HDL Ratio: 2.8 ratio (ref 0.0–4.4)
Cholesterol, Total: 178 mg/dL (ref 100–199)
HDL: 64 mg/dL (ref >39)
LDL Chol Calc (NIH): 96 mg/dL (ref 0–99)
Triglycerides: 100 mg/dL (ref 0–149)
VLDL Cholesterol Cal: 18 mg/dL (ref 5–40)

## 2021-02-19 LAB — HEMOGLOBIN A1C
Est. average glucose Bld gHb Est-mCnc: 128 mg/dL
Hgb A1c MFr Bld: 6.1 % — ABNORMAL HIGH (ref 4.8–5.6)

## 2021-02-19 LAB — VITAMIN D 25 HYDROXY (VIT D DEFICIENCY, FRACTURES): Vit D, 25-Hydroxy: 41.1 ng/mL (ref 30.0–100.0)

## 2021-02-19 LAB — INSULIN, RANDOM: INSULIN: 7.6 u[IU]/mL (ref 2.6–24.9)

## 2021-02-26 NOTE — Progress Notes (Signed)
Chief Complaint:   OBESITY Jillian Green is here to discuss her progress with her obesity treatment plan along with follow-up of her obesity related diagnoses. Jillian Green is on following a lower carbohydrate, vegetable and lean protein rich diet plan and states she is following her eating plan approximately 80% of the time. [name] states she is walking for 15-20 minutes 5 times per week.  Today's visit was #: 31 Starting weight: 184 lbs Starting date: 04/29/2018 Today's weight: 167 lbs Today's date: 02/18/2021 Total lbs lost to date: 17 Total lbs lost since last in-office visit: 4  Interim History: Jillian Green did not like the Low carbohydrate plan and she stopped after 2 weeks. She has done a good job decreasing her sugar intake. She is following Category 3 and also journaling. She is having a hysterectomy next month. She is exceeding 1500 calories some days.  Subjective:   1. Other hyperlipidemia Lyann is on Lipitor, and she denies myalgias or chest pain.  2. Type 2 diabetes mellitus with hyperlipidemia (HCC) Jillian Green is on Jardiance and metformin. Last A1c with her primary care physician in 11/2020 was 5.7.  3. Vitamin D deficiency Jillian Green is not on Vit D, and she reports fatigue.  4. At risk for heart disease Jillian Green is at a higher than average risk for cardiovascular disease due to obesity.   Assessment/Plan:   1. Other hyperlipidemia Cardiovascular risk and specific lipid/LDL goals reviewed. We discussed several lifestyle modifications today. We will check labs today, and we will refill Lipitor for 90 days with no refills. [name] will continue to work on diet, exercise and weight loss efforts. Orders and follow up as documented in patient record.   Counseling Intensive lifestyle modifications are the first line treatment for this issue. Dietary changes: Increase soluble fiber. Decrease simple carbohydrates. Exercise changes: Moderate to vigorous-intensity aerobic activity 150 minutes per week if  tolerated. Lipid-lowering medications: see documented in medical record.   - Lipid panel - atorvastatin (LIPITOR) 10 MG tablet; Take 1 tablet (10 mg total) by mouth daily.  Dispense: 90 tablet; Refill: 0  2. Type 2 diabetes mellitus with hyperlipidemia (HCC) We will check labs today. We will refill Jardiance and metformin for 90 days with no refills. Good blood sugar control is important to decrease the likelihood of diabetic complications such as nephropathy, neuropathy, limb loss, blindness, coronary artery disease, and death. Intensive lifestyle modification including diet, exercise and weight loss are the first line of treatment for diabetes.   - empagliflozin (JARDIANCE) 25 MG TABS tablet; Take 1 tablet (25 mg total) by mouth daily.  Dispense: 90 tablet; Refill: 0 - metFORMIN (GLUCOPHAGE) 500 MG tablet; Take 1 tablet (500 mg total) by mouth as directed. Take two tabs by mouth twice daily  Dispense: 120 tablet; Refill: 0  - Comprehensive metabolic panel - Hemoglobin A1c - Insulin, random  3. Vitamin D deficiency Low Vitamin D level contributes to fatigue and are associated with obesity, breast, and colon cancer. We will check labs today. [name] will follow-up for routine testing of Vitamin D, at least 2-3 times per year to avoid over-replacement.  - VITAMIN D 25 Hydroxy (Vit-D Deficiency, Fractures)  4. At risk for heart disease [name] was given approximately 15 minutes of coronary artery disease prevention counseling today. She is 50 y.o. female and has risk factors for heart disease including obesity. We discussed intensive lifestyle modifications today with an emphasis on specific weight loss instructions and strategies.   Repetitive spaced learning was employed today  to elicit superior memory formation and behavioral change.  5. Class 1 obesity with serious comorbidity and body mass index (BMI) of 30.0 to 30.9 in adult, unspecified obesity type, current bmi 27.79 Jillian Green is currently  in the action stage of change. As such, her goal is to continue with weight loss efforts. She has agreed to keeping a food journal and adhering to recommended goals of 1400-1500 calories and 95 grams of protein daily.   Exercise goals: As is.  Behavioral modification strategies: meal planning and cooking strategies and keeping healthy foods in the home.  Jillian Green has agreed to follow-up with our clinic in 3 weeks. She was informed of the importance of frequent follow-up visits to maximize her success with intensive lifestyle modifications for her multiple health conditions.   Jillian Green was informed we would discuss her lab results at her next visit unless there is a critical issue that needs to be addressed sooner. [name] agreed to keep her next visit at the agreed upon time to discuss these results.  Objective:   Blood pressure 111/72, pulse 87, temperature 98 F (36.7 C), height 5\' 5"  (1.651 m), weight 167 lb (75.8 kg), SpO2 98 %. Body mass index is 27.79 kg/m.  General: Cooperative, alert, well developed, in no acute distress. HEENT: Conjunctivae and lids unremarkable. Cardiovascular: Regular rhythm.  Lungs: Normal work of breathing. Neurologic: No focal deficits.   Lab Results  Component Value Date   CREATININE 0.81 02/18/2021   BUN 12 02/18/2021   NA 142 02/18/2021   K 4.5 02/18/2021   CL 102 02/18/2021   CO2 26 02/18/2021   Lab Results  Component Value Date   ALT 19 02/18/2021   AST 15 02/18/2021   ALKPHOS 127 (H) 02/18/2021   BILITOT 0.3 02/18/2021   Lab Results  Component Value Date   HGBA1C 6.1 (H) 02/18/2021   HGBA1C 5.8 (H) 06/18/2020   HGBA1C 6.0 (H) 02/17/2020   HGBA1C 5.9 (H) 08/22/2019   HGBA1C 5.8 (H) 03/11/2019   Lab Results  Component Value Date   INSULIN 7.6 02/18/2021   INSULIN 14.0 06/18/2020   INSULIN 9.9 02/17/2020   INSULIN 13.8 08/22/2019   INSULIN 7.6 03/11/2019   Lab Results  Component Value Date   TSH 0.770 04/29/2018   Lab Results   Component Value Date   CHOL 178 02/18/2021   HDL 64 02/18/2021   LDLCALC 96 02/18/2021   TRIG 100 02/18/2021   CHOLHDL 2.8 02/18/2021   Lab Results  Component Value Date   VD25OH 41.1 02/18/2021   VD25OH 86.9 06/18/2020   VD25OH 58.8 02/17/2020   Lab Results  Component Value Date   WBC 6.8 04/29/2018   HGB 13.7 04/29/2018   HCT 42.3 04/29/2018   MCV 95 04/29/2018   PLT 305 10/23/2016   Lab Results  Component Value Date   IRON 156 (H) 09/23/2013   FERRITIN 103.5 09/23/2013   Attestation Statements:   Reviewed by clinician on day of visit: allergies, medications, problem list, medical history, surgical history, family history, social history, and previous encounter notes.   09/25/2013, am acting as transcriptionist for Trude Mcburney, PA-C.  I have reviewed the above documentation for accuracy and completeness, and I agree with the above. [employer], PA-C

## 2021-03-14 ENCOUNTER — Ambulatory Visit (INDEPENDENT_AMBULATORY_CARE_PROVIDER_SITE_OTHER): Payer: BC Managed Care – PPO | Admitting: Physician Assistant

## 2021-03-17 ENCOUNTER — Other Ambulatory Visit (INDEPENDENT_AMBULATORY_CARE_PROVIDER_SITE_OTHER): Payer: Self-pay | Admitting: Physician Assistant

## 2021-03-17 DIAGNOSIS — E1169 Type 2 diabetes mellitus with other specified complication: Secondary | ICD-10-CM

## 2021-03-17 DIAGNOSIS — E785 Hyperlipidemia, unspecified: Secondary | ICD-10-CM

## 2021-03-18 ENCOUNTER — Encounter (INDEPENDENT_AMBULATORY_CARE_PROVIDER_SITE_OTHER): Payer: Self-pay

## 2021-03-20 HISTORY — PX: ABDOMINAL HYSTERECTOMY: SHX81

## 2021-04-01 ENCOUNTER — Encounter (INDEPENDENT_AMBULATORY_CARE_PROVIDER_SITE_OTHER): Payer: Self-pay

## 2021-04-01 ENCOUNTER — Other Ambulatory Visit (INDEPENDENT_AMBULATORY_CARE_PROVIDER_SITE_OTHER): Payer: Self-pay | Admitting: Physician Assistant

## 2021-04-01 ENCOUNTER — Ambulatory Visit (INDEPENDENT_AMBULATORY_CARE_PROVIDER_SITE_OTHER): Payer: BC Managed Care – PPO | Admitting: Physician Assistant

## 2021-04-01 DIAGNOSIS — F3289 Other specified depressive episodes: Secondary | ICD-10-CM

## 2021-04-01 NOTE — Telephone Encounter (Signed)
MyChart message sent to pt to find out if they have enough medication to get them through until next appt.   

## 2021-04-07 ENCOUNTER — Other Ambulatory Visit (INDEPENDENT_AMBULATORY_CARE_PROVIDER_SITE_OTHER): Payer: Self-pay | Admitting: Physician Assistant

## 2021-04-07 DIAGNOSIS — E785 Hyperlipidemia, unspecified: Secondary | ICD-10-CM

## 2021-04-07 DIAGNOSIS — E1169 Type 2 diabetes mellitus with other specified complication: Secondary | ICD-10-CM

## 2021-04-09 NOTE — Telephone Encounter (Signed)
Jillian Green 

## 2021-04-09 NOTE — Telephone Encounter (Signed)
LAST APPOINTMENT DATE: 02/18/2021 NEXT APPOINTMENT DATE: 04/22/2021   [id number] - [place] - [address] [place] [address] Phone: [phone] Fax: [phone]  [id number] - [place], [place] - [address] [place] [address] Phone: [phone] Fax: [phone]  Patient is requesting a refill of the following medications: Requested Prescriptions   Pending Prescriptions Disp Refills   metFORMIN (GLUCOPHAGE) 500 MG tablet 120 tablet 0    Sig: Take 1 tablet (500 mg total) by mouth as directed. Take two tabs by mouth twice daily    Date last filled: 02/23/2021 Previously prescribed by [facility]  Lab Results  Component Value Date   HGBA1C 6.1 (H) 02/18/2021   HGBA1C 5.8 (H) 06/18/2020   HGBA1C 6.0 (H) 02/17/2020   Lab Results  Component Value Date   LDLCALC 96 02/18/2021   CREATININE 0.81 02/18/2021   Lab Results  Component Value Date   VD25OH 41.1 02/18/2021   VD25OH 86.9 06/18/2020   VD25OH 58.8 02/17/2020    BP Readings from Last 3 Encounters:  02/18/21 111/72  02/01/21 98/60  10/01/20 126/80

## 2021-04-10 MED ORDER — METFORMIN HCL 500 MG PO TABS: 500.0000 mg | ORAL_TABLET | ORAL | 0 refills | Status: DC

## 2021-04-22 ENCOUNTER — Encounter (INDEPENDENT_AMBULATORY_CARE_PROVIDER_SITE_OTHER): Payer: Self-pay | Admitting: Physician Assistant

## 2021-04-22 ENCOUNTER — Other Ambulatory Visit: Payer: Self-pay

## 2021-04-22 ENCOUNTER — Ambulatory Visit (INDEPENDENT_AMBULATORY_CARE_PROVIDER_SITE_OTHER): Payer: BC Managed Care – PPO | Admitting: Physician Assistant

## 2021-04-22 VITALS — BP 130/84 | HR 78 | Temp 97.7°F | Ht 65.0 in | Wt 168.0 lb

## 2021-04-22 DIAGNOSIS — F3289 Other specified depressive episodes: Secondary | ICD-10-CM

## 2021-04-22 DIAGNOSIS — Z9189 Other specified personal risk factors, not elsewhere classified: Secondary | ICD-10-CM

## 2021-04-22 DIAGNOSIS — E7849 Other hyperlipidemia: Secondary | ICD-10-CM | POA: Diagnosis not present

## 2021-04-22 DIAGNOSIS — E669 Obesity, unspecified: Secondary | ICD-10-CM

## 2021-04-22 DIAGNOSIS — E1169 Type 2 diabetes mellitus with other specified complication: Secondary | ICD-10-CM

## 2021-04-22 DIAGNOSIS — Z6830 Body mass index (BMI) 30.0-30.9, adult: Secondary | ICD-10-CM

## 2021-04-22 DIAGNOSIS — E785 Hyperlipidemia, unspecified: Secondary | ICD-10-CM

## 2021-04-22 MED ORDER — BUPROPION HCL ER (XL) 300 MG PO TB24: 300.0000 mg | ORAL_TABLET | Freq: Every day | ORAL | 0 refills | Status: DC

## 2021-04-22 MED ORDER — EMPAGLIFLOZIN 25 MG PO TABS: 25.0000 mg | ORAL_TABLET | Freq: Every day | ORAL | 0 refills | Status: DC

## 2021-04-22 MED ORDER — ATORVASTATIN CALCIUM 10 MG PO TABS: 10.0000 mg | ORAL_TABLET | Freq: Every day | ORAL | 0 refills | Status: DC

## 2021-04-22 MED ORDER — METFORMIN HCL 500 MG PO TABS: 500.0000 mg | ORAL_TABLET | ORAL | 0 refills | Status: DC

## 2021-04-22 NOTE — Progress Notes (Signed)
Chief Complaint:   OBESITY Jillian Green is here to discuss her progress with her obesity treatment plan along with follow-up of her obesity related diagnoses. Jillian Green is on the Category 3 Plan and keeping a food journal and adhering to recommended goals of 1400-1500 calories and 95 grams of protein and states she is following her eating plan approximately 50% of the time. Jillian Green states she is doing 0 minutes 0 times per week.  Today's visit was #: 32 Starting weight: 184 lbs Starting date: 04/29/2018 Today's weight: 168 lbs Today's date: 04/22/2021 Total lbs lost to date: 16 lbs Total lbs lost since last in-office visit: 0  Interim History: Jillian Green had a hysterectomy about 3 weeks ago and did not eat on plan right after surgery. She got back on plan last week. She has had extra stress with moving houses. She is overeating her calories by 200-300 daily.  Subjective:   1. Type 2 diabetes mellitus with hyperlipidemia (HCC) Jillian Green's last A1C level was 6.1. She is on Jardiance and Metformin. She is tolerating it well.  2. Other hyperlipidemia Jillian Green is on Atorvastatin and tolerating it well.  3. Other depression, with emotional eating  Jillian Green's Gyn changed her to XL version. She feels better overall and less cravings.   4. At risk for hypoglycemia Jillian Green is at risk for hypoglycemia due to diabetes mellitus.  Assessment/Plan:   1. Type 2 diabetes mellitus with hyperlipidemia (HCC) Good blood sugar control is important to decrease the likelihood of diabetic complications such as nephropathy, neuropathy, limb loss, blindness, coronary artery disease, and death. Intensive lifestyle modification including diet, exercise and weight loss are the first line of treatment for diabetes. We will refill Metformin for 1 month with no refills. We will refill Jardiance for 3 months with no refills.  - metFORMIN (GLUCOPHAGE) 500 MG tablet; Take 1 tablet (500 mg total) by mouth as directed. Take two tabs by mouth  twice daily  Dispense: 120 tablet; Refill: 0  - empagliflozin (JARDIANCE) 25 MG TABS tablet; Take 1 tablet (25 mg total) by mouth daily.  Dispense: 90 tablet; Refill: 0   2. Other hyperlipidemia Cardiovascular risk and specific lipid/LDL goals reviewed.  We discussed several lifestyle modifications today and Jillian Green will continue to work on diet, exercise and weight loss efforts. We will refill Atorvastatin for 3 month with no refills. Orders and follow up as documented in patient record.   Counseling Intensive lifestyle modifications are the first line treatment for this issue. Dietary changes: Increase soluble fiber. Decrease simple carbohydrates. Exercise changes: Moderate to vigorous-intensity aerobic activity 150 minutes per week if tolerated. Lipid-lowering medications: see documented in medical record.  - atorvastatin (LIPITOR) 10 MG tablet; Take 1 tablet (10 mg total) by mouth daily.  Dispense: 90 tablet; Refill: 0  3. Other depression, with emotional eating  Behavior modification techniques were discussed today to help Jillian Green deal with her emotional/non-hunger eating behaviors.  We will refill Wellbutrin XL 300 mg once daily for 3 months with no refills. Orders and follow up as documented in patient record.    - buPROPion (WELLBUTRIN XL) 300 MG 24 hr tablet; Take 1 tablet (300 mg total) by mouth daily.  Dispense: 90 tablet; Refill: 0  4. At risk for hypoglycemia Fasting labs will be obtained and results with be discussed with Jillian Green in 2 weeks at her follow up visit. In the meanwhile Jillian Green was started on a lower simple carbohydrate diet and will work on weight loss efforts.  5. Obesity with current BMI 27.96 Jillian Green is currently in the action stage of change. As such, her goal is to continue with weight loss efforts. She has agreed to keeping a food journal and adhering to recommended goals of 1500 calories and 100 grams of  protein daily.  Jillian Green will Pre-journal.  Exercise goals: No  exercise has been prescribed at this time.  Behavioral modification strategies: meal planning and cooking strategies, planning for success, and keeping a strict food journal.  Jillian Green has agreed to follow-up with our clinic in 8 weeks. She was informed of the importance of frequent follow-up visits to maximize her success with intensive lifestyle modifications for her multiple health conditions.   Objective:   Blood pressure 130/84, pulse 78, temperature 97.7 F (36.5 C), height 5\' 5"  (1.651 m), weight 168 lb (76.2 kg), SpO2 98 %. Body mass index is 27.96 kg/m.  General: Cooperative, alert, well developed, in no acute distress. HEENT: Conjunctivae and lids unremarkable. Cardiovascular: Regular rhythm.  Lungs: Normal work of breathing. Neurologic: No focal deficits.   Lab Results  Component Value Date   CREATININE 0.81 02/18/2021   BUN 12 02/18/2021   NA 142 02/18/2021   K 4.5 02/18/2021   CL 102 02/18/2021   CO2 26 02/18/2021   Lab Results  Component Value Date   ALT 19 02/18/2021   AST 15 02/18/2021   ALKPHOS 127 (H) 02/18/2021   BILITOT 0.3 02/18/2021   Lab Results  Component Value Date   HGBA1C 6.1 (H) 02/18/2021   HGBA1C 5.8 (H) 06/18/2020   HGBA1C 6.0 (H) 02/17/2020   HGBA1C 5.9 (H) 08/22/2019   HGBA1C 5.8 (H) 03/11/2019   Lab Results  Component Value Date   INSULIN 7.6 02/18/2021   INSULIN 14.0 06/18/2020   INSULIN 9.9 02/17/2020   INSULIN 13.8 08/22/2019   INSULIN 7.6 03/11/2019   Lab Results  Component Value Date   TSH 0.770 04/29/2018   Lab Results  Component Value Date   CHOL 178 02/18/2021   HDL 64 02/18/2021   LDLCALC 96 02/18/2021   TRIG 100 02/18/2021   CHOLHDL 2.8 02/18/2021   Lab Results  Component Value Date   VD25OH 41.1 02/18/2021   VD25OH 86.9 06/18/2020   VD25OH 58.8 02/17/2020   Lab Results  Component Value Date   WBC 6.8 04/29/2018   HGB 13.7 04/29/2018   HCT 42.3 04/29/2018   MCV 95 04/29/2018   PLT 305 10/23/2016    Lab Results  Component Value Date   IRON 156 (H) 09/23/2013   FERRITIN 103.5 09/23/2013   Attestation Statements:   Reviewed by clinician on day of visit: allergies, medications, problem list, medical history, surgical history, family history, social history, and previous encounter notes.  I, 09/25/2013, am acting as Sindy Messing for [employer], PA-C.   I have reviewed the above documentation for accuracy and completeness, and I agree with the above. [employer], PA-C

## 2021-04-23 ENCOUNTER — Other Ambulatory Visit (HOSPITAL_BASED_OUTPATIENT_CLINIC_OR_DEPARTMENT_OTHER): Payer: Self-pay | Admitting: Obstetrics and Gynecology

## 2021-04-23 DIAGNOSIS — Z1231 Encounter for screening mammogram for malignant neoplasm of breast: Secondary | ICD-10-CM

## 2021-04-25 ENCOUNTER — Other Ambulatory Visit: Payer: Self-pay | Admitting: Family Medicine

## 2021-04-25 DIAGNOSIS — J302 Other seasonal allergic rhinitis: Secondary | ICD-10-CM

## 2021-05-03 ENCOUNTER — Other Ambulatory Visit (INDEPENDENT_AMBULATORY_CARE_PROVIDER_SITE_OTHER): Payer: Self-pay | Admitting: Physician Assistant

## 2021-05-03 ENCOUNTER — Other Ambulatory Visit: Payer: Self-pay | Admitting: Family Medicine

## 2021-05-03 DIAGNOSIS — M797 Fibromyalgia: Secondary | ICD-10-CM

## 2021-05-03 DIAGNOSIS — E1169 Type 2 diabetes mellitus with other specified complication: Secondary | ICD-10-CM

## 2021-05-06 NOTE — Telephone Encounter (Signed)
Last OV with Tracey 

## 2021-06-03 ENCOUNTER — Ambulatory Visit (HOSPITAL_BASED_OUTPATIENT_CLINIC_OR_DEPARTMENT_OTHER): Payer: BC Managed Care – PPO

## 2021-06-04 ENCOUNTER — Telehealth (HOSPITAL_BASED_OUTPATIENT_CLINIC_OR_DEPARTMENT_OTHER): Payer: Self-pay

## 2021-06-06 ENCOUNTER — Other Ambulatory Visit (INDEPENDENT_AMBULATORY_CARE_PROVIDER_SITE_OTHER): Payer: Self-pay | Admitting: Physician Assistant

## 2021-06-06 DIAGNOSIS — E785 Hyperlipidemia, unspecified: Secondary | ICD-10-CM

## 2021-06-06 DIAGNOSIS — E1169 Type 2 diabetes mellitus with other specified complication: Secondary | ICD-10-CM

## 2021-06-06 NOTE — Telephone Encounter (Signed)
Pt last seen by Tracey Aguilar, PA-C.  

## 2021-06-10 ENCOUNTER — Encounter: Payer: Self-pay | Admitting: Family Medicine

## 2021-06-10 DIAGNOSIS — F988 Other specified behavioral and emotional disorders with onset usually occurring in childhood and adolescence: Secondary | ICD-10-CM

## 2021-06-10 MED ORDER — LISDEXAMFETAMINE DIMESYLATE 50 MG PO CAPS: 50.0000 mg | ORAL_CAPSULE | Freq: Every day | ORAL | 0 refills | Status: DC

## 2021-06-10 NOTE — Telephone Encounter (Signed)
LAST APPOINTMENT DATE: 04/22/21 NEXT APPOINTMENT DATE: 07/01/21   [id number] [place]  - [address] [place] [address] Phone: [phone] Fax: [phone]  [id number] - [place], [place] - [address] [place] [address] Phone: [phone] Fax: [phone]  Patient is requesting a refill of the following medications: Requested Prescriptions   Pending Prescriptions Disp Refills   metFORMIN (GLUCOPHAGE) 500 MG tablet [Pharmacy Med Name: METFORMIN HCL 500 MG TABLET] 120 tablet 0    Sig: TAKE 2 TABLETS BY MOUTH TWICE A DAY    Date last filled: 04/22/21 [facility]  Lab Results  Component Value Date   HGBA1C 6.1 (H) 02/18/2021   HGBA1C 5.8 (H) 06/18/2020   HGBA1C 6.0 (H) 02/17/2020   Lab Results  Component Value Date   LDLCALC 96 02/18/2021   CREATININE 0.81 02/18/2021   Lab Results  Component Value Date   VD25OH 41.1 02/18/2021   VD25OH 86.9 06/18/2020   VD25OH 58.8 02/17/2020    BP Readings from Last 3 Encounters:  04/22/21 130/84  02/18/21 111/72  02/01/21 98/60

## 2021-06-10 NOTE — Telephone Encounter (Signed)
Requesting: [place]  Contract: 02/01/21 UDS: 02/01/21 Last OV: 02/01/21  Next OV:  Last Refill: 02/01/2021, #30--0 RF Database:   Please advise

## 2021-07-01 ENCOUNTER — Other Ambulatory Visit: Payer: Self-pay

## 2021-07-01 ENCOUNTER — Encounter (INDEPENDENT_AMBULATORY_CARE_PROVIDER_SITE_OTHER): Payer: Self-pay | Admitting: Physician Assistant

## 2021-07-01 ENCOUNTER — Ambulatory Visit (INDEPENDENT_AMBULATORY_CARE_PROVIDER_SITE_OTHER): Payer: BC Managed Care – PPO | Admitting: Physician Assistant

## 2021-07-01 VITALS — BP 120/82 | HR 100 | Temp 97.7°F | Ht 65.0 in | Wt 168.0 lb

## 2021-07-01 DIAGNOSIS — Z6830 Body mass index (BMI) 30.0-30.9, adult: Secondary | ICD-10-CM

## 2021-07-01 DIAGNOSIS — E785 Hyperlipidemia, unspecified: Secondary | ICD-10-CM | POA: Diagnosis not present

## 2021-07-01 DIAGNOSIS — E669 Obesity, unspecified: Secondary | ICD-10-CM | POA: Diagnosis not present

## 2021-07-01 DIAGNOSIS — E559 Vitamin D deficiency, unspecified: Secondary | ICD-10-CM | POA: Diagnosis not present

## 2021-07-01 DIAGNOSIS — E1169 Type 2 diabetes mellitus with other specified complication: Secondary | ICD-10-CM

## 2021-07-01 DIAGNOSIS — Z9189 Other specified personal risk factors, not elsewhere classified: Secondary | ICD-10-CM

## 2021-07-01 NOTE — Progress Notes (Signed)
Chief Complaint:   OBESITY Jillian Green is here to discuss her progress with her obesity treatment plan along with follow-up of her obesity related diagnoses. Jillian Green is on keeping a food journal and adhering to recommended goals of 1500 calories and 100 grams of protein daily and states she is following her eating plan approximately 25% of the time. Jillian Green states she is doing 0 minutes 0 times per week.  Today's visit was #: 33 Starting weight: 184 lbs Starting date: 04/29/2018 Today's weight: 168 lbs Today's date: 07/01/2021 Total lbs lost to date: 16 Total lbs lost since last in-office visit: 0  Interim History: Jillian Green reports that she tyically starts the day well and as the day progresses, especially if she is stressed, she gets off plan. She is not exercising.  Subjective:   1. Type 2 diabetes mellitus with hyperlipidemia (HCC) Jillian Green is on Jardiance and metformin. Her last A1c was 6.1.  2. Vitamin D deficiency Jillian Green is not on Vit D currently. Her energy is lower but she is stressed with work.  3. At risk for heart disease Jillian Green is at a higher than average risk for cardiovascular disease due to obesity.   Assessment/Plan:   1. Type 2 diabetes mellitus with hyperlipidemia (HCC) We will check labs today. Good blood sugar control is important to decrease the likelihood of diabetic complications such as nephropathy, neuropathy, limb loss, blindness, coronary artery disease, and death. Intensive lifestyle modification including diet, exercise and weight loss are the first line of treatment for diabetes.   - Comprehensive metabolic panel - Hemoglobin A1c - Insulin, random  2. Vitamin D deficiency Low Vitamin D level contributes to fatigue and are associated with obesity, breast, and colon cancer. We will check labs today. Jillian Green will follow-up for routine testing of Vitamin D, at least 2-3 times per year to avoid over-replacement.  - VITAMIN D 25 Hydroxy (Vit-D Deficiency,  Fractures)  3. At risk for heart disease Jillian Green was given approximately 15 minutes of coronary artery disease prevention counseling today. She is 50 y.o. female and has risk factors for heart disease including obesity. We discussed intensive lifestyle modifications today with an emphasis on specific weight loss instructions and strategies.   Repetitive spaced learning was employed today to elicit superior memory formation and behavioral change.  4. Class 1 obesity with serious comorbidity and body mass index (BMI) of 30.0 to 30.9 in adult, unspecified obesity type Jillian Green is currently in the action stage of change. As such, her goal is to continue with weight loss efforts. She has agreed to keeping a food journal and adhering to recommended goals of 1500 calories and 95 grams of protein daily.   Exercise goals: No exercise has been prescribed at this time.  Behavioral modification strategies: meal planning and cooking strategies, planning for success, and keeping a strict food journal.  Jillian Green has agreed to follow-up with our clinic in 6 weeks. She was informed of the importance of frequent follow-up visits to maximize her success with intensive lifestyle modifications for her multiple health conditions.   Jillian Green was informed we would discuss her lab results at her next visit unless there is a critical issue that needs to be addressed sooner. Jillian Green agreed to keep her next visit at the agreed upon time to discuss these results.  Objective:   Blood pressure 120/82, pulse 100, temperature 97.7 F (36.5 C), height 5\' 5"  (1.651 m), weight 168 lb (76.2 kg), SpO2 99 %. Body mass index is 27.96 kg/m.  General: Cooperative, alert, well developed, in no acute distress. HEENT: Conjunctivae and lids unremarkable. Cardiovascular: Regular rhythm.  Lungs: Normal work of breathing. Neurologic: No focal deficits.   Lab Results  Component Value Date   CREATININE 0.81 02/18/2021   BUN 12 02/18/2021   NA  142 02/18/2021   K 4.5 02/18/2021   CL 102 02/18/2021   CO2 26 02/18/2021   Lab Results  Component Value Date   ALT 19 02/18/2021   AST 15 02/18/2021   ALKPHOS 127 (H) 02/18/2021   BILITOT 0.3 02/18/2021   Lab Results  Component Value Date   HGBA1C 6.1 (H) 02/18/2021   HGBA1C 5.8 (H) 06/18/2020   HGBA1C 6.0 (H) 02/17/2020   HGBA1C 5.9 (H) 08/22/2019   HGBA1C 5.8 (H) 03/11/2019   Lab Results  Component Value Date   INSULIN 7.6 02/18/2021   INSULIN 14.0 06/18/2020   INSULIN 9.9 02/17/2020   INSULIN 13.8 08/22/2019   INSULIN 7.6 03/11/2019   Lab Results  Component Value Date   TSH 0.770 04/29/2018   Lab Results  Component Value Date   CHOL 178 02/18/2021   HDL 64 02/18/2021   LDLCALC 96 02/18/2021   TRIG 100 02/18/2021   CHOLHDL 2.8 02/18/2021   Lab Results  Component Value Date   VD25OH 41.1 02/18/2021   VD25OH 86.9 06/18/2020   VD25OH 58.8 02/17/2020   Lab Results  Component Value Date   WBC 6.8 04/29/2018   HGB 13.7 04/29/2018   HCT 42.3 04/29/2018   MCV 95 04/29/2018   PLT 305 10/23/2016   Lab Results  Component Value Date   IRON 156 (H) 09/23/2013   FERRITIN 103.5 09/23/2013   Attestation Statements:   Reviewed by clinician on day of visit: allergies, medications, problem list, medical history, surgical history, family history, social history, and previous encounter notes.   Trude Mcburney, am acting as transcriptionist for [employer], PA-C.  I have reviewed the above documentation for accuracy and completeness, and I agree with the above. Alois Cliche, PA-C

## 2021-07-02 LAB — COMPREHENSIVE METABOLIC PANEL
ALT: 21 IU/L (ref 0–32)
AST: 16 IU/L (ref 0–40)
Albumin/Globulin Ratio: 2.3 — ABNORMAL HIGH (ref 1.2–2.2)
Albumin: 4.8 g/dL (ref 3.8–4.8)
Alkaline Phosphatase: 140 IU/L — ABNORMAL HIGH (ref 44–121)
BUN/Creatinine Ratio: 16 (ref 9–23)
BUN: 12 mg/dL (ref 6–24)
Bilirubin Total: 0.3 mg/dL (ref 0.0–1.2)
CO2: 21 mmol/L (ref 20–29)
Calcium: 9.4 mg/dL (ref 8.7–10.2)
Chloride: 103 mmol/L (ref 96–106)
Creatinine, Ser: 0.77 mg/dL (ref 0.57–1.00)
Globulin, Total: 2.1 g/dL (ref 1.5–4.5)
Glucose: 141 mg/dL — ABNORMAL HIGH (ref 70–99)
Potassium: 4.3 mmol/L (ref 3.5–5.2)
Sodium: 143 mmol/L (ref 134–144)
Total Protein: 6.9 g/dL (ref 6.0–8.5)
eGFR: 95 mL/min/{1.73_m2} (ref >59)

## 2021-07-02 LAB — VITAMIN D 25 HYDROXY (VIT D DEFICIENCY, FRACTURES): Vit D, 25-Hydroxy: 43.5 ng/mL (ref 30.0–100.0)

## 2021-07-02 LAB — HEMOGLOBIN A1C
Est. average glucose Bld gHb Est-mCnc: 131 mg/dL
Hgb A1c MFr Bld: 6.2 % — ABNORMAL HIGH (ref 4.8–5.6)

## 2021-07-02 LAB — INSULIN, RANDOM: INSULIN: 10.1 u[IU]/mL (ref 2.6–24.9)

## 2021-07-03 ENCOUNTER — Telehealth (HOSPITAL_BASED_OUTPATIENT_CLINIC_OR_DEPARTMENT_OTHER): Payer: Self-pay

## 2021-07-09 ENCOUNTER — Other Ambulatory Visit (INDEPENDENT_AMBULATORY_CARE_PROVIDER_SITE_OTHER): Payer: Self-pay | Admitting: Physician Assistant

## 2021-07-09 DIAGNOSIS — E1169 Type 2 diabetes mellitus with other specified complication: Secondary | ICD-10-CM

## 2021-07-09 DIAGNOSIS — E785 Hyperlipidemia, unspecified: Secondary | ICD-10-CM

## 2021-07-09 NOTE — Telephone Encounter (Signed)
Left patient a msg to see if she had enough of her metformin before her next office visit

## 2021-07-26 ENCOUNTER — Other Ambulatory Visit (INDEPENDENT_AMBULATORY_CARE_PROVIDER_SITE_OTHER): Payer: Self-pay | Admitting: Physician Assistant

## 2021-07-26 DIAGNOSIS — F3289 Other specified depressive episodes: Secondary | ICD-10-CM

## 2021-07-30 ENCOUNTER — Encounter (INDEPENDENT_AMBULATORY_CARE_PROVIDER_SITE_OTHER): Payer: Self-pay

## 2021-07-30 ENCOUNTER — Other Ambulatory Visit: Payer: Self-pay | Admitting: Family Medicine

## 2021-07-30 DIAGNOSIS — G8929 Other chronic pain: Secondary | ICD-10-CM

## 2021-07-30 DIAGNOSIS — F3289 Other specified depressive episodes: Secondary | ICD-10-CM

## 2021-07-30 NOTE — Telephone Encounter (Signed)
Msg sent to pt 

## 2021-08-01 ENCOUNTER — Encounter: Payer: Self-pay | Admitting: Family Medicine

## 2021-08-01 MED ORDER — BUPROPION HCL ER (XL) 300 MG PO TB24: 300.0000 mg | ORAL_TABLET | Freq: Every day | ORAL | 0 refills | Status: DC

## 2021-08-02 NOTE — Telephone Encounter (Signed)
Are you okay with pt doing a virtual and may be doing a UDS later?

## 2021-08-13 ENCOUNTER — Encounter: Payer: Self-pay | Admitting: Family Medicine

## 2021-08-13 DIAGNOSIS — F988 Other specified behavioral and emotional disorders with onset usually occurring in childhood and adolescence: Secondary | ICD-10-CM

## 2021-08-13 MED ORDER — LISDEXAMFETAMINE DIMESYLATE 50 MG PO CAPS: 50.0000 mg | ORAL_CAPSULE | Freq: Every day | ORAL | 0 refills | Status: DC

## 2021-08-13 NOTE — Telephone Encounter (Signed)
Requesting: Vyvanse 50mg  Contract:  02/01/2021 UDS: 02/01/2021 Last Visit: 02/01/2021 Next Visit: 09/02/2021 Last Refill: 06/10/2021 #30 and 0RF  Please Advise

## 2021-08-16 ENCOUNTER — Ambulatory Visit: Payer: BC Managed Care – PPO | Admitting: Family Medicine

## 2021-08-19 ENCOUNTER — Other Ambulatory Visit: Payer: Self-pay

## 2021-08-19 ENCOUNTER — Ambulatory Visit (INDEPENDENT_AMBULATORY_CARE_PROVIDER_SITE_OTHER): Payer: BC Managed Care – PPO | Admitting: Physician Assistant

## 2021-08-19 ENCOUNTER — Encounter (INDEPENDENT_AMBULATORY_CARE_PROVIDER_SITE_OTHER): Payer: Self-pay | Admitting: Physician Assistant

## 2021-08-19 VITALS — BP 109/74 | HR 75 | Temp 98.2°F | Ht 65.0 in | Wt 167.0 lb

## 2021-08-19 DIAGNOSIS — E785 Hyperlipidemia, unspecified: Secondary | ICD-10-CM

## 2021-08-19 DIAGNOSIS — Z9189 Other specified personal risk factors, not elsewhere classified: Secondary | ICD-10-CM

## 2021-08-19 DIAGNOSIS — Z6827 Body mass index (BMI) 27.0-27.9, adult: Secondary | ICD-10-CM | POA: Diagnosis not present

## 2021-08-19 DIAGNOSIS — Z7984 Long term (current) use of oral hypoglycemic drugs: Secondary | ICD-10-CM

## 2021-08-19 DIAGNOSIS — E669 Obesity, unspecified: Secondary | ICD-10-CM | POA: Diagnosis not present

## 2021-08-19 DIAGNOSIS — Z6830 Body mass index (BMI) 30.0-30.9, adult: Secondary | ICD-10-CM

## 2021-08-19 DIAGNOSIS — E1169 Type 2 diabetes mellitus with other specified complication: Secondary | ICD-10-CM | POA: Diagnosis not present

## 2021-08-19 MED ORDER — EMPAGLIFLOZIN 25 MG PO TABS: 25.0000 mg | ORAL_TABLET | Freq: Every day | ORAL | 0 refills | Status: DC

## 2021-08-19 MED ORDER — METFORMIN HCL 500 MG PO TABS: 1000.0000 mg | ORAL_TABLET | Freq: Two times a day (BID) | ORAL | 0 refills | Status: DC

## 2021-08-19 MED ORDER — ATORVASTATIN CALCIUM 10 MG PO TABS: 10.0000 mg | ORAL_TABLET | Freq: Every day | ORAL | 0 refills | Status: DC

## 2021-08-19 NOTE — Progress Notes (Signed)
Chief Complaint:   OBESITY Jillian Green is here to discuss her progress with her obesity treatment plan along with follow-up of her obesity related diagnoses. Jillian Green is on keeping a food journal and adhering to recommended goals of 1500 calories and 95 grams of protein daily and states she is following her eating plan approximately 30% of the time. Jillian Green states she is doing 0 minutes 0 times per week.  Today's visit was #: 34 Starting weight: 184 lbs Starting date: 04/29/2018 Today's weight: 167 lbs Today's date: 08/19/2021 Total lbs lost to date: 17 Total lbs lost since last in-office visit: 1  Interim History: Jillian Green had the flu and COVID over the holidays. She was eating a lot of soup and crackers. She feels ready to get back on track. She wants to start walking and doing yoga.  Subjective:   1. Type 2 diabetes mellitus with hyperlipidemia (HCC) Jillian Green is on Jardiance and metformin. Her last A1c was well controlled at 6.2. I discussed labs with the patient today.  2. Hyperlipidemia associated with type 2 diabetes mellitus (HCC) Jillian Green is on atorvastatin, and she is tolerating it well.   3. At risk for hypoglycemia Jillian Green is at increased risk for hypoglycemia due to changes in diet, diagnosis of diabetes, and/or insulin use.    Assessment/Plan:   1. Type 2 diabetes mellitus with hyperlipidemia (HCC) We will refill metformin and Jardiance for 90 days with no refills. Good blood sugar control is important to decrease the likelihood of diabetic complications such as nephropathy, neuropathy, limb loss, blindness, coronary artery disease, and death. Intensive lifestyle modification including diet, exercise and weight loss are the first line of treatment for diabetes.   - empagliflozin (JARDIANCE) 25 MG TABS tablet; Take 1 tablet (25 mg total) by mouth daily.  Dispense: 90 tablet; Refill: 0 - metFORMIN (GLUCOPHAGE) 500 MG tablet; Take 2 tablets (1,000 mg total) by mouth 2 (two) times daily.   Dispense: 120 tablet; Refill: 0  2. Hyperlipidemia associated with type 2 diabetes mellitus (HCC) Cardiovascular risk and specific lipid/LDL goals reviewed. We discussed several lifestyle modifications today. We will refill atorvastatin for 90 days with no refills. Bryah will continue to work on diet, exercise and weight loss efforts. Orders and follow up as documented in patient record.   Counseling Intensive lifestyle modifications are the first line treatment for this issue. Dietary changes: Increase soluble fiber. Decrease simple carbohydrates. Exercise changes: Moderate to vigorous-intensity aerobic activity 150 minutes per week if tolerated. Lipid-lowering medications: see documented in medical record.  - atorvastatin (LIPITOR) 10 MG tablet; Take 1 tablet (10 mg total) by mouth daily.  Dispense: 90 tablet; Refill: 0  3. At risk for hypoglycemia Jillian Green was given approximately 15 minutes of counseling today regarding prevention of hypoglycemia. She was advised of symptoms of hypoglycemia. Jillian Green was instructed to avoid skipping meals, eat regular protein rich meals and schedule low calorie snacks as needed.   Repetitive spaced learning was employed today to elicit superior memory formation and behavioral change  4. Obesity with current BMI 27.79 Jillian Green is currently in the action stage of change. As such, her goal is to continue with weight loss efforts. She has agreed to the Category 3 Plan and keeping a food journal and adhering to recommended goals of 1500 calories and 95 grams of protein daily.   Exercise goals: Walking and yoga for 3 days per week.  Behavioral modification strategies: meal planning and cooking strategies and keeping healthy foods in the home.  Jillian Green has agreed to follow-up with our clinic in 8 weeks. She was informed of the importance of frequent follow-up visits to maximize her success with intensive lifestyle modifications for her multiple health conditions.    Objective:   Blood pressure 109/74, pulse 75, temperature 98.2 F (36.8 C), height 5\' 5"  (1.651 m), weight 167 lb (75.8 kg), SpO2 98 %. Body mass index is 27.79 kg/m.  General: Cooperative, alert, well developed, in no acute distress. HEENT: Conjunctivae and lids unremarkable. Cardiovascular: Regular rhythm.  Lungs: Normal work of breathing. Neurologic: No focal deficits.   Lab Results  Component Value Date   CREATININE 0.77 07/01/2021   BUN 12 07/01/2021   NA 143 07/01/2021   K 4.3 07/01/2021   CL 103 07/01/2021   CO2 21 07/01/2021   Lab Results  Component Value Date   ALT 21 07/01/2021   AST 16 07/01/2021   ALKPHOS 140 (H) 07/01/2021   BILITOT 0.3 07/01/2021   Lab Results  Component Value Date   HGBA1C 6.2 (H) 07/01/2021   HGBA1C 6.1 (H) 02/18/2021   HGBA1C 5.8 (H) 06/18/2020   HGBA1C 6.0 (H) 02/17/2020   HGBA1C 5.9 (H) 08/22/2019   Lab Results  Component Value Date   INSULIN 10.1 07/01/2021   INSULIN 7.6 02/18/2021   INSULIN 14.0 06/18/2020   INSULIN 9.9 02/17/2020   INSULIN 13.8 08/22/2019   Lab Results  Component Value Date   TSH 0.770 04/29/2018   Lab Results  Component Value Date   CHOL 178 02/18/2021   HDL 64 02/18/2021   LDLCALC 96 02/18/2021   TRIG 100 02/18/2021   CHOLHDL 2.8 02/18/2021   Lab Results  Component Value Date   VD25OH 43.5 07/01/2021   VD25OH 41.1 02/18/2021   VD25OH 86.9 06/18/2020   Lab Results  Component Value Date   WBC 6.8 04/29/2018   HGB 13.7 04/29/2018   HCT 42.3 04/29/2018   MCV 95 04/29/2018   PLT 305 10/23/2016   Lab Results  Component Value Date   IRON 156 (H) 09/23/2013   FERRITIN 103.5 09/23/2013   Attestation Statements:   Reviewed by clinician on day of visit: allergies, medications, problem list, medical history, surgical history, family history, social history, and previous encounter notes.   09/25/2013, am acting as transcriptionist for Trude Mcburney, PA-C.  I have reviewed the  above documentation for accuracy and completeness, and I agree with the above. [employer]

## 2021-09-02 ENCOUNTER — Encounter: Payer: Self-pay | Admitting: Family Medicine

## 2021-09-02 ENCOUNTER — Telehealth: Payer: Self-pay

## 2021-09-02 ENCOUNTER — Telehealth (INDEPENDENT_AMBULATORY_CARE_PROVIDER_SITE_OTHER): Payer: BC Managed Care – PPO | Admitting: Family Medicine

## 2021-09-02 DIAGNOSIS — F988 Other specified behavioral and emotional disorders with onset usually occurring in childhood and adolescence: Secondary | ICD-10-CM

## 2021-09-02 MED ORDER — LISDEXAMFETAMINE DIMESYLATE 60 MG PO CAPS: 60.0000 mg | ORAL_CAPSULE | ORAL | 0 refills | Status: DC

## 2021-09-02 NOTE — Patient Instructions (Signed)
Living With Attention Deficit Hyperactivity Disorder If you have been diagnosed with attention deficit hyperactivity disorder (ADHD), you may be relieved that you now know why you have felt or behaved a certain way. Still, you may feel overwhelmed about the treatment ahead. You may also wonder how to get the support you need and how to deal with the condition day-to-day. With treatment and support, you can live with ADHD and manage your symptoms. How to manage lifestyle changes Managing stress Stress is your body's reaction to life changes and events, both good and bad. To cope with the stress of an ADHD diagnosis, it may help to: Learn more about ADHD. Exercise regularly. Even a short daily walk can lower stress levels. Participate in training or education programs (including social skills training classes) that teach you to deal with symptoms.  Medicines Your health care provider may suggest certain medicines if he or she feels that they will help to improve your condition. Stimulant medicines are usually prescribed to treat ADHD, and therapy may also be prescribed. It is important to: Avoid using alcohol and other substances that may prevent your medicines from working properly (may interact). Talk with your pharmacist or health care provider about all the medicines that you take, their possible side effects, and what medicines are safe to take together. Make it your goal to take part in all treatment decisions (shared decision-making). Ask about possible side effects of medicines that your health care provider recommends, and tell him or her how you feel about having those side effects. It is best if shared decision-making with your health care provider is part of your total treatment plan. Relationships To strengthen your relationships with family members while treating your condition, consider taking part in family therapy. You might also attend self-help groups alone or with a loved one. Be  honest about how your symptoms affect your relationships. Make an effort to communicate respectfully instead of fighting, and find ways to show others that you care. Psychotherapy may be useful in helping you cope with how ADHD affects your relationships. How to recognize changes in your condition The following signs may mean that your treatment is working well and your condition is improving: Consistently being on time for appointments. Being more organized at home and work. Other people noticing improvements in your behavior. Achieving goals that you set for yourself. Thinking more clearly. The following signs may mean that your treatment is not working very well: Feeling impatience or more confusion. Missing, forgetting, or being late for appointments. An increasing sense of disorganization and messiness. More difficulty in reaching goals that you set for yourself. Loved ones becoming angry or frustrated with you. Follow these instructions at home: Take over-the-counter and prescription medicines only as told by your health care provider. Check with your health care provider before taking any new medicines. Create structure and an organized atmosphere at home. For example: Make a list of tasks, then rank them from most important to least important. Work on one task at a time until your listed tasks are done. Make a daily schedule and follow it consistently every day. Use an appointment calendar, and check it 2 or 3 times a day to keep on track. Keep it with you when you leave the house. Create spaces where you keep certain things, and always put things back in their places after you use them. Keep all follow-up visits as told by your health care provider. This is important. Where to find support Talking to others    Keep emotion out of important discussions and speak in a calm, logical way. Listen closely and patiently to your loved ones. Try to understand their point of view, and try to  avoid getting defensive. Take responsibility for the consequences of your actions. Ask that others do not take your behaviors personally. Aim to solve problems as they come up, and express your feelings instead of bottling them up. Talk openly about what you need from your loved ones and how they can support you. Consider going to family therapy sessions or having your family meet with a specialist who deals with ADHD-related behavior problems. Finances Not all insurance plans cover mental health care, so it is important to check with your insurance carrier. If paying for co-pays or counseling services is a problem, search for a local or county mental health care center. Public mental health care services may be offered there at a low cost or no cost when you are not able to see a private health care provider. If you are taking medicine for ADHD, you may be able to get the generic form, which may be less expensive than brand-name medicine. Some makers of prescription medicines also offer help to patients who cannot afford the medicines that they need. Questions to ask your health care provider: What are the risks and benefits of taking medicines? Would I benefit from therapy? How often should I follow up with a health care provider? Contact a health care provider if: You have side effects from your medicines, such as: Repeated muscle twitches, coughing, or speech outbursts. Sleep problems. Loss of appetite. Depression. New or worsening behavior problems. Dizziness. Unusually fast heartbeat. Stomach pains. Headaches. Get help right away if: You have a severe reaction to a medicine. Your behavior suddenly gets worse. Summary With treatment and support, you can live with ADHD and manage your symptoms. The medicines that are most often prescribed for ADHD are stimulants. Consider taking part in family therapy or self-help groups with family members or friends. When you talk with friends  and family about your ADHD, be patient and communicate openly. Take over-the-counter and prescription medicines only as told by your health care provider. Check with your health care provider before taking any new medicines. This information is not intended to replace advice given to you by your health care provider. Make sure you discuss any questions you have with your health care provider. Document Revised: 01/04/2020 Document Reviewed: 01/04/2020 [employer] Patient Education  2022 [employer]  

## 2021-09-02 NOTE — Progress Notes (Signed)
MyChart Video Visit    Virtual Visit via Video Note   This visit type was conducted due to national recommendations for restrictions regarding the COVID-19 Pandemic (e.g. social distancing) in an effort to limit this patient's exposure and mitigate transmission in our community. This patient is at least at moderate risk for complications without adequate follow up. This format is felt to be most appropriate for this patient at this time. Physical exam was limited by quality of the video and audio technology used for the visit. Jillian Green was able to get the patient set up on a video visit. Patient location: work Patient and provider in visit Provider location: Office  I discussed the limitations of evaluation and management by telemedicine and the availability of in person appointments. The patient expressed understanding and agreed to proceed.  Visit Date: 09/02/2021  Today's healthcare provider: Ann Held, DO     Subjective:    Patient ID: Jillian Green, female    DOB: Nov 11, 1970, 51 y.o.   MRN: [id number]  No chief complaint on file.   HPI Patient is in today for f/u add-- she feels like it wears off about 330pm   no other complaints   Past Medical History:  Diagnosis Date   ADD (attention deficit disorder)    Anxiety    Back pain    Chronic headaches    Depression    Fibromyalgia    GERD (gastroesophageal reflux disease)    Itchy skin    Joint pain    Lactose intolerance    Migraine    Sweating increase    Type 2 diabetes mellitus without complication, without long-term current use of insulin ([place]) 09/13/2018   Unspecified eustachian tube disorder     Past Surgical History:  Procedure Laterality Date   CESAREAN SECTION     OTHER SURGICAL HISTORY     preforated uterine surgery   SINUS EXPLORATION      Family History  Problem Relation Age of Onset   Other Father        Dyslexia   Heart disease Father    Hypertension Father     Alcoholism Father    Hypertension Mother    Thyroid disease Mother    Anxiety disorder Mother    Sleep apnea Mother    Obesity Mother    ADD / ADHD Sister     Social History   Socioeconomic History   Marital status: Married    Spouse name: Haematologist   Number of children: 1   Years of education: Not on file   Highest education level: Not on file  Occupational History   Occupation: middle Physiological scientist    Employer: [employer]  Tobacco Use   Smoking status: Former    Years: 8.00    Types: Cigarettes    Quit date: 05/15/2006    Years since quitting: 15.3   Smokeless tobacco: Never   Tobacco comments:    1 cig every 4 months  Substance and Sexual Activity   Alcohol use: Yes    Alcohol/week: 1.0 standard drink    Types: 1 Glasses of wine per week   Drug use: No   Sexual activity: Yes    Partners: Male  Other Topics Concern   Not on file  Social History Narrative   Exercise--  Yes --until she had stress fracture   Social Determinants of Health   Financial Resource Strain: Not on file  Food Insecurity: Not on  file  Transportation Needs: Not on file  Physical Activity: Not on file  Stress: Not on file  Social Connections: Not on file  Intimate Partner Violence: Not on file    Outpatient Medications Prior to Visit  Medication Sig Dispense Refill   ALPRAZolam (XANAX) 0.5 MG tablet Take 1 tablet (0.5 mg total) by mouth 3 (three) times daily as needed for anxiety. 45 tablet 1   atorvastatin (LIPITOR) 10 MG tablet Take 1 tablet (10 mg total) by mouth daily. 90 tablet 0   azelastine (OPTIVAR) 0.05 % ophthalmic solution Place 1 drop into both eyes 2 (two) times daily as needed. 18 mL 1   BD PEN NEEDLE NANO U/F 32G X 4 MM MISC USE TWICE A DAY 100 each 0   blood glucose meter kit and supplies Dispense based on patient and insurance preference. Use up to four times daily as directed. (FOR ICD-10 E10.9, E11.9). 1 each 0   buPROPion (WELLBUTRIN XL) 300 MG 24 hr  tablet Take 1 tablet (300 mg total) by mouth daily. 90 tablet 0   DULoxetine (CYMBALTA) 60 MG capsule TAKE 1 CAPSULE BY MOUTH 2 TIMES DAILY. 180 capsule 1   empagliflozin (JARDIANCE) 25 MG TABS tablet Take 1 tablet (25 mg total) by mouth daily. 90 tablet 0   fluticasone (FLONASE) 50 MCG/ACT nasal spray SPRAY 2 SPRAYS INTO EACH NOSTRIL EVERY DAY 48 mL 2   glucose blood (ONETOUCH ULTRA) test strip USE AS INSTRUCTED 100 strip 0   hydrOXYzine (ATARAX/VISTARIL) 25 MG tablet TAKE 1 TO 2 TABLETS BY MOUTH AT BEDTIME AS NEEDED FOR SLEEP 180 tablet 1   levocetirizine (XYZAL) 5 MG tablet Take 1 tablet (5 mg total) by mouth every evening. 90 tablet 3   lisdexamfetamine (VYVANSE) 50 MG capsule Take 1 capsule (50 mg total) by mouth daily. 30 capsule 0   meloxicam (MOBIC) 15 MG tablet TAKE 1/2 TO 1 TABLET BY MOUTH DAILY AS NEEDED 30 tablet 2   metFORMIN (GLUCOPHAGE) 500 MG tablet Take 2 tablets (1,000 mg total) by mouth 2 (two) times daily. 120 tablet 0   OneTouch Delica Lancets 91Q MISC 1 Device by Does not apply route daily. 100 each 0   rizatriptan (MAXALT-MLT) 10 MG disintegrating tablet Take 1 tablet (10 mg total) by mouth as needed for migraine. May repeat in 2 hours if needed 10 tablet 4   No facility-administered medications prior to visit.    Allergies  Allergen Reactions   Augmentin [Amoxicillin-Pot Clavulanate]     Severe yeast infections    Review of Systems  Constitutional:  Negative for fever and malaise/fatigue.  HENT:  Negative for congestion.   Eyes:  Negative for blurred vision.  Respiratory:  Negative for shortness of breath.   Cardiovascular:  Negative for chest pain, palpitations and leg swelling.  Gastrointestinal:  Negative for abdominal pain, blood in stool and nausea.  Genitourinary:  Negative for dysuria and frequency.  Musculoskeletal:  Negative for falls.  Skin:  Negative for rash.  Neurological:  Negative for dizziness, loss of consciousness and headaches.   Endo/Heme/Allergies:  Negative for environmental allergies.  Psychiatric/Behavioral:  Negative for depression. The patient is not nervous/anxious.       Objective:    Physical Exam Vitals and nursing note reviewed.  Pulmonary:     Effort: Pulmonary effort is normal.  Psychiatric:        Mood and Affect: Mood normal.        Behavior: Behavior normal.  Thought Content: Thought content normal.        Judgment: Judgment normal.    There were no vitals taken for this visit. Wt Readings from Last 3 Encounters:  08/19/21 167 lb (75.8 kg)  07/01/21 168 lb (76.2 kg)  04/22/21 168 lb (76.2 kg)    Diabetic Foot Exam - Simple   No data filed    Lab Results  Component Value Date   WBC 6.8 04/29/2018   HGB 13.7 04/29/2018   HCT 42.3 04/29/2018   PLT 305 10/23/2016   GLUCOSE 141 (H) 07/01/2021   CHOL 178 02/18/2021   TRIG 100 02/18/2021   HDL 64 02/18/2021   LDLCALC 96 02/18/2021   ALT 21 07/01/2021   AST 16 07/01/2021   NA 143 07/01/2021   K 4.3 07/01/2021   CL 103 07/01/2021   CREATININE 0.77 07/01/2021   BUN 12 07/01/2021   CO2 21 07/01/2021   TSH 0.770 04/29/2018   HGBA1C 6.2 (H) 07/01/2021    Lab Results  Component Value Date   TSH 0.770 04/29/2018   Lab Results  Component Value Date   WBC 6.8 04/29/2018   HGB 13.7 04/29/2018   HCT 42.3 04/29/2018   MCV 95 04/29/2018   PLT 305 10/23/2016   Lab Results  Component Value Date   NA 143 07/01/2021   K 4.3 07/01/2021   CO2 21 07/01/2021   GLUCOSE 141 (H) 07/01/2021   BUN 12 07/01/2021   CREATININE 0.77 07/01/2021   BILITOT 0.3 07/01/2021   ALKPHOS 140 (H) 07/01/2021   AST 16 07/01/2021   ALT 21 07/01/2021   PROT 6.9 07/01/2021   ALBUMIN 4.8 07/01/2021   CALCIUM 9.4 07/01/2021   ANIONGAP 12 10/23/2016   EGFR 95 07/01/2021   GFR 99.95 05/12/2016   Lab Results  Component Value Date   CHOL 178 02/18/2021   Lab Results  Component Value Date   HDL 64 02/18/2021   Lab Results  Component  Value Date   LDLCALC 96 02/18/2021   Lab Results  Component Value Date   TRIG 100 02/18/2021   Lab Results  Component Value Date   CHOLHDL 2.8 02/18/2021   Lab Results  Component Value Date   HGBA1C 6.2 (H) 07/01/2021       Assessment & Plan:   Problem List Items Addressed This Visit       Unprioritized   Attention deficit disorder   Relevant Medications   lisdexamfetamine (VYVANSE) 60 MG capsule   ADD (attention deficit disorder)    Inc vyvanse to 60 mg daily F/u 1 month or sooner prn       Relevant Medications   lisdexamfetamine (VYVANSE) 60 MG capsule    I am having Jillian Green start on lisdexamfetamine. I am also having her maintain her BD Pen Needle Nano U/F, blood glucose meter kit and supplies, OneTouch Ultra, OneTouch Delica Lancets 93T, azelastine, rizatriptan, ALPRAZolam, levocetirizine, fluticasone, hydrOXYzine, DULoxetine, meloxicam, buPROPion, lisdexamfetamine, atorvastatin, empagliflozin, and metFORMIN.  Meds ordered this encounter  Medications   lisdexamfetamine (VYVANSE) 60 MG capsule    Sig: Take 1 capsule (60 mg total) by mouth every morning.    Dispense:  30 capsule    Refill:  0    I discussed the assessment and treatment plan with the patient. The patient was provided an opportunity to ask questions and all were answered. The patient agreed with the plan and demonstrated an understanding of the instructions.   The patient was advised to call  back or seek an in-person evaluation if the symptoms worsen or if the condition fails to improve as anticipated.     Ann Held, DO [place] [facility] [phone] (phone) [phone] (fax)  [place]

## 2021-09-02 NOTE — Assessment & Plan Note (Signed)
Inc vyvanse to 60 mg daily F/u 1 month or sooner prn

## 2021-09-02 NOTE — Telephone Encounter (Signed)
PA approved.   Effective 09/02/2021 to 09/02/2024.

## 2021-09-02 NOTE — Telephone Encounter (Signed)
PA initiated via Covermymeds; KEY; [id number]. Awaiting determination.

## 2021-09-20 ENCOUNTER — Other Ambulatory Visit (INDEPENDENT_AMBULATORY_CARE_PROVIDER_SITE_OTHER): Payer: Self-pay | Admitting: Physician Assistant

## 2021-09-20 DIAGNOSIS — E1169 Type 2 diabetes mellitus with other specified complication: Secondary | ICD-10-CM

## 2021-09-20 DIAGNOSIS — E785 Hyperlipidemia, unspecified: Secondary | ICD-10-CM

## 2021-09-23 NOTE — Telephone Encounter (Signed)
Pt last seen by Tracey Aguilar, PA-C.  

## 2021-09-23 NOTE — Telephone Encounter (Signed)
Mychart message sent.

## 2021-10-13 ENCOUNTER — Other Ambulatory Visit: Payer: Self-pay | Admitting: Family Medicine

## 2021-10-13 DIAGNOSIS — F988 Other specified behavioral and emotional disorders with onset usually occurring in childhood and adolescence: Secondary | ICD-10-CM

## 2021-10-14 ENCOUNTER — Ambulatory Visit (INDEPENDENT_AMBULATORY_CARE_PROVIDER_SITE_OTHER): Payer: BC Managed Care – PPO | Admitting: Physician Assistant

## 2021-10-14 LAB — HM MAMMOGRAPHY

## 2021-10-14 MED ORDER — LISDEXAMFETAMINE DIMESYLATE 60 MG PO CAPS: 60.0000 mg | ORAL_CAPSULE | ORAL | 0 refills | Status: DC

## 2021-10-14 NOTE — Telephone Encounter (Signed)
Requesting: Vyvanse 60mg   ?Contract: 02/01/2021 ?UDS: 02/01/2021 ?Last Visit: 09/02/2021 ?Next Visit: None ?Last Refill: 09/02/2021 #30 and 0RF ? ?Please Advise ? ?

## 2021-11-14 ENCOUNTER — Ambulatory Visit (INDEPENDENT_AMBULATORY_CARE_PROVIDER_SITE_OTHER): Payer: BC Managed Care – PPO | Admitting: Physician Assistant

## 2021-11-18 ENCOUNTER — Encounter (INDEPENDENT_AMBULATORY_CARE_PROVIDER_SITE_OTHER): Payer: Self-pay | Admitting: Family Medicine

## 2021-11-18 ENCOUNTER — Encounter: Payer: Self-pay | Admitting: Family Medicine

## 2021-11-18 ENCOUNTER — Ambulatory Visit (INDEPENDENT_AMBULATORY_CARE_PROVIDER_SITE_OTHER): Payer: BC Managed Care – PPO | Admitting: Family Medicine

## 2021-11-18 ENCOUNTER — Telehealth (INDEPENDENT_AMBULATORY_CARE_PROVIDER_SITE_OTHER): Payer: Self-pay

## 2021-11-18 ENCOUNTER — Ambulatory Visit (INDEPENDENT_AMBULATORY_CARE_PROVIDER_SITE_OTHER): Payer: BC Managed Care – PPO | Admitting: Physician Assistant

## 2021-11-18 VITALS — BP 114/80 | HR 103 | Temp 98.1°F | Ht 65.0 in | Wt 168.0 lb

## 2021-11-18 DIAGNOSIS — F3289 Other specified depressive episodes: Secondary | ICD-10-CM | POA: Diagnosis not present

## 2021-11-18 DIAGNOSIS — E1169 Type 2 diabetes mellitus with other specified complication: Secondary | ICD-10-CM

## 2021-11-18 DIAGNOSIS — Z6828 Body mass index (BMI) 28.0-28.9, adult: Secondary | ICD-10-CM

## 2021-11-18 DIAGNOSIS — Z7984 Long term (current) use of oral hypoglycemic drugs: Secondary | ICD-10-CM

## 2021-11-18 DIAGNOSIS — E785 Hyperlipidemia, unspecified: Secondary | ICD-10-CM | POA: Diagnosis not present

## 2021-11-18 DIAGNOSIS — E669 Obesity, unspecified: Secondary | ICD-10-CM

## 2021-11-18 MED ORDER — BUPROPION HCL ER (XL) 300 MG PO TB24: 300.0000 mg | ORAL_TABLET | Freq: Every day | ORAL | 0 refills | Status: DC

## 2021-11-18 MED ORDER — EMPAGLIFLOZIN 25 MG PO TABS: 25.0000 mg | ORAL_TABLET | Freq: Every day | ORAL | 0 refills | Status: DC

## 2021-11-18 MED ORDER — METFORMIN HCL 500 MG PO TABS: 1000.0000 mg | ORAL_TABLET | Freq: Two times a day (BID) | ORAL | 0 refills | Status: DC

## 2021-11-18 MED ORDER — ATORVASTATIN CALCIUM 10 MG PO TABS: 10.0000 mg | ORAL_TABLET | Freq: Every day | ORAL | 0 refills | Status: DC

## 2021-11-18 NOTE — Telephone Encounter (Signed)
Opened in error

## 2021-11-20 ENCOUNTER — Encounter: Payer: Self-pay | Admitting: Family Medicine

## 2021-11-20 DIAGNOSIS — F988 Other specified behavioral and emotional disorders with onset usually occurring in childhood and adolescence: Secondary | ICD-10-CM

## 2021-11-20 NOTE — Telephone Encounter (Signed)
Requesting: Vyvanse 60mg   ?Contract: 02/01/21 ?UDS: 02/01/21 ?Last Visit: 09/02/21 ?Next Visit: None ?Last Refill: 10/14/2021 #30 and 0RF ? ?Please Advise ? ?

## 2021-11-21 MED ORDER — LISDEXAMFETAMINE DIMESYLATE 60 MG PO CAPS: 60.0000 mg | ORAL_CAPSULE | ORAL | 0 refills | Status: DC

## 2021-11-21 NOTE — Telephone Encounter (Signed)
Call to get daughter scheduled.... LVM ?

## 2021-11-24 ENCOUNTER — Other Ambulatory Visit: Payer: Self-pay | Admitting: Family Medicine

## 2021-11-24 DIAGNOSIS — M797 Fibromyalgia: Secondary | ICD-10-CM

## 2021-11-24 DIAGNOSIS — J302 Other seasonal allergic rhinitis: Secondary | ICD-10-CM

## 2021-11-28 NOTE — Progress Notes (Signed)
? ? ? ?Chief Complaint:  ? ?OBESITY ?Jillian Green is here to discuss her progress with her obesity treatment plan along with follow-up of her obesity related diagnoses. Jillian Green is on the Category 3 Plan or keeping a food journal and adhering to recommended goals of 1500 calories and 95 grams of protein daily and states she is following her eating plan approximately 50% of the time. Jillian Green states she is walking for 7 times per week. ? ?Today's visit was #: 35 ?Starting weight: 184 lbs ?Starting date: 04/29/2018 ?Today's weight: 168 lbs ?Today's date: 11/18/2021 ?Total lbs lost to date: 16 ?Total lbs lost since last in-office visit: 0 ? ?Interim History: Jillian Green ha shad extra stress and challenges recently. She hasn't been able to follow her plan as closely, but she is still mindful of her choices. Her protein intake may be lower than ideal.  ? ?Subjective:  ? ?1. Type 2 diabetes mellitus with hyperlipidemia (HCC) ?Jillian Green's A1c is controlled on her medications, and with diet and exercise. No side effects were noted.  ? ?2. Hyperlipidemia associated with type 2 diabetes mellitus (HCC) ?Jillian Green is stable on Lipitor. She is doing well with her diet. She denies chest pain or myalgias.  ? ?3. Other depression, with emotional eating  ?Jillian Green is stable on her medications. She has had increased stress with her mother's breast cancer diagnosis. She appears to be dealing with this well.  ? ?Assessment/Plan:  ? ?1. Type 2 diabetes mellitus with hyperlipidemia (HCC) ?Jillian Green will continue her medications, and we will refill metformin and Jardiance for 90 days.  ? ?- empagliflozin (JARDIANCE) 25 MG TABS tablet; Take 1 tablet (25 mg total) by mouth daily.  Dispense: 90 tablet; Refill: 0 ?- metFORMIN (GLUCOPHAGE) 500 MG tablet; Take 2 tablets (1,000 mg total) by mouth 2 (two) times daily.  Dispense: 120 tablet; Refill: 0 ? ?2. Hyperlipidemia associated with type 2 diabetes mellitus (HCC) ?Cardiovascular risk and specific lipid/LDL goals reviewed. We  discussed several lifestyle modifications today. We will refill Lipitor for 90 days with no refills. We will recheck labs in 1 month. Jillian Green will continue to work on diet, exercise and weight loss efforts. Orders and follow up as documented in patient record.  ? ?- atorvastatin (LIPITOR) 10 MG tablet; Take 1 tablet (10 mg total) by mouth daily.  Dispense: 90 tablet; Refill: 0 ? ?3. Other depression, with emotional eating  ?We will refill Wellbutrin XL for 90 days with no refills. Behavior modification techniques were discussed today to help Jillian Green deal with her emotional/non-hunger eating behaviors.  Orders and follow up as documented in patient record.  ? ?- buPROPion (WELLBUTRIN XL) 300 MG 24 hr tablet; Take 1 tablet (300 mg total) by mouth daily.  Dispense: 90 tablet; Refill: 0 ? ?4. Obesity, with current BMI of 28.0 ?Jillian Green is currently in the action stage of change. As such, her goal is to continue with weight loss efforts. She has agreed to the Category 3 Plan or keeping a food journal and adhering to recommended goals of 1500 calories and 95 grams of protein daily.  ? ?Exercise goals: As is.  ? ?Behavioral modification strategies: increasing lean protein intake and better snacking choices. ? ?Jillian Green has agreed to follow-up with our clinic in 8 weeks. She was informed of the importance of frequent follow-up visits to maximize her success with intensive lifestyle modifications for her multiple health conditions.  ? ?Objective:  ? ?Blood pressure 114/80, pulse (!) 103, temperature 98.1 ?F (36.7 ?C), height 5\' 5"  (  1.651 m), weight 168 lb (76.2 kg), SpO2 97 %. ?Body mass index is 27.96 kg/m?. ? ?General: Cooperative, alert, well developed, in no acute distress. ?HEENT: Conjunctivae and lids unremarkable. ?Cardiovascular: Regular rhythm.  ?Lungs: Normal work of breathing. ?Neurologic: No focal deficits.  ? ?Lab Results  ?Component Value Date  ? CREATININE 0.77 07/01/2021  ? BUN 12 07/01/2021  ? NA 143 07/01/2021  ? K  4.3 07/01/2021  ? CL 103 07/01/2021  ? CO2 21 07/01/2021  ? ?Lab Results  ?Component Value Date  ? ALT 21 07/01/2021  ? AST 16 07/01/2021  ? ALKPHOS 140 (H) 07/01/2021  ? BILITOT 0.3 07/01/2021  ? ?Lab Results  ?Component Value Date  ? HGBA1C 6.2 (H) 07/01/2021  ? HGBA1C 6.1 (H) 02/18/2021  ? HGBA1C 5.8 (H) 06/18/2020  ? HGBA1C 6.0 (H) 02/17/2020  ? HGBA1C 5.9 (H) 08/22/2019  ? ?Lab Results  ?Component Value Date  ? INSULIN 10.1 07/01/2021  ? INSULIN 7.6 02/18/2021  ? INSULIN 14.0 06/18/2020  ? INSULIN 9.9 02/17/2020  ? INSULIN 13.8 08/22/2019  ? ?Lab Results  ?Component Value Date  ? TSH 0.770 04/29/2018  ? ?Lab Results  ?Component Value Date  ? CHOL 178 02/18/2021  ? HDL 64 02/18/2021  ? LDLCALC 96 02/18/2021  ? TRIG 100 02/18/2021  ? CHOLHDL 2.8 02/18/2021  ? ?Lab Results  ?Component Value Date  ? VD25OH 43.5 07/01/2021  ? VD25OH 41.1 02/18/2021  ? VD25OH 86.9 06/18/2020  ? ?Lab Results  ?Component Value Date  ? WBC 6.8 04/29/2018  ? HGB 13.7 04/29/2018  ? HCT 42.3 04/29/2018  ? MCV 95 04/29/2018  ? PLT 305 10/23/2016  ? ?Lab Results  ?Component Value Date  ? IRON 156 (H) 09/23/2013  ? FERRITIN 103.5 09/23/2013  ? ?Attestation Statements:  ? ?Reviewed by clinician on day of visit: allergies, medications, problem list, medical history, surgical history, family history, social history, and previous encounter notes. ? ? ?I, Burt Knack, am acting as transcriptionist for Quillian Quince, MD. ? ?I have reviewed the above documentation for accuracy and completeness, and I agree with the above. -  Quillian Quince, MD ? ? ?

## 2021-12-11 ENCOUNTER — Other Ambulatory Visit (INDEPENDENT_AMBULATORY_CARE_PROVIDER_SITE_OTHER): Payer: Self-pay | Admitting: Family Medicine

## 2021-12-11 DIAGNOSIS — E1169 Type 2 diabetes mellitus with other specified complication: Secondary | ICD-10-CM

## 2021-12-20 ENCOUNTER — Other Ambulatory Visit (INDEPENDENT_AMBULATORY_CARE_PROVIDER_SITE_OTHER): Payer: Self-pay | Admitting: Family Medicine

## 2021-12-20 ENCOUNTER — Encounter (INDEPENDENT_AMBULATORY_CARE_PROVIDER_SITE_OTHER): Payer: Self-pay | Admitting: Family Medicine

## 2021-12-20 ENCOUNTER — Encounter: Payer: Self-pay | Admitting: Family Medicine

## 2021-12-20 DIAGNOSIS — E1169 Type 2 diabetes mellitus with other specified complication: Secondary | ICD-10-CM

## 2021-12-20 MED ORDER — METFORMIN HCL 500 MG PO TABS: 1000.0000 mg | ORAL_TABLET | Freq: Two times a day (BID) | ORAL | 0 refills | Status: DC

## 2021-12-20 NOTE — Addendum Note (Signed)
Addended by: Roxanne Gates on: 12/20/2021 02:07 PM   Modules accepted: Orders

## 2021-12-20 NOTE — Telephone Encounter (Signed)
Okay to refill? 

## 2021-12-23 MED ORDER — METFORMIN HCL 500 MG PO TABS: 1000.0000 mg | ORAL_TABLET | Freq: Two times a day (BID) | ORAL | 0 refills | Status: DC

## 2021-12-23 NOTE — Telephone Encounter (Signed)
Ok to rf x 1

## 2021-12-23 NOTE — Telephone Encounter (Signed)
Dr.Beasley 

## 2022-01-06 ENCOUNTER — Encounter: Payer: Self-pay | Admitting: Family Medicine

## 2022-01-06 DIAGNOSIS — F988 Other specified behavioral and emotional disorders with onset usually occurring in childhood and adolescence: Secondary | ICD-10-CM

## 2022-01-06 MED ORDER — LISDEXAMFETAMINE DIMESYLATE 60 MG PO CAPS: 60.0000 mg | ORAL_CAPSULE | ORAL | 0 refills | Status: DC

## 2022-01-06 NOTE — Telephone Encounter (Signed)
Requesting: Vyvanse Contract: 02/01/2021 UDS: 02/01/2021 Last OV: 09/02/2021 Next OV: N/A Last Refill: 11/21/2021, #30--0 RF Database:   Please advise

## 2022-01-11 ENCOUNTER — Other Ambulatory Visit: Payer: Self-pay | Admitting: Family Medicine

## 2022-01-11 DIAGNOSIS — E1169 Type 2 diabetes mellitus with other specified complication: Secondary | ICD-10-CM

## 2022-01-21 ENCOUNTER — Encounter (INDEPENDENT_AMBULATORY_CARE_PROVIDER_SITE_OTHER): Payer: Self-pay | Admitting: Family Medicine

## 2022-01-21 ENCOUNTER — Ambulatory Visit (INDEPENDENT_AMBULATORY_CARE_PROVIDER_SITE_OTHER): Payer: BC Managed Care – PPO | Admitting: Family Medicine

## 2022-01-21 VITALS — BP 127/81 | HR 95 | Temp 98.3°F | Ht 65.0 in | Wt 161.8 lb

## 2022-01-21 DIAGNOSIS — Z6830 Body mass index (BMI) 30.0-30.9, adult: Secondary | ICD-10-CM

## 2022-01-21 DIAGNOSIS — Z7984 Long term (current) use of oral hypoglycemic drugs: Secondary | ICD-10-CM

## 2022-01-21 DIAGNOSIS — E7849 Other hyperlipidemia: Secondary | ICD-10-CM | POA: Diagnosis not present

## 2022-01-21 DIAGNOSIS — E669 Obesity, unspecified: Secondary | ICD-10-CM | POA: Diagnosis not present

## 2022-01-21 DIAGNOSIS — E119 Type 2 diabetes mellitus without complications: Secondary | ICD-10-CM | POA: Diagnosis not present

## 2022-01-21 DIAGNOSIS — E559 Vitamin D deficiency, unspecified: Secondary | ICD-10-CM

## 2022-01-22 LAB — HEMOGLOBIN A1C
Est. average glucose Bld gHb Est-mCnc: 137 mg/dL
Hgb A1c MFr Bld: 6.4 % — ABNORMAL HIGH (ref 4.8–5.6)

## 2022-01-22 LAB — CMP14+EGFR
ALT: 22 IU/L (ref 0–32)
AST: 18 IU/L (ref 0–40)
Albumin/Globulin Ratio: 2.1 (ref 1.2–2.2)
Albumin: 5 g/dL — ABNORMAL HIGH (ref 3.8–4.8)
Alkaline Phosphatase: 109 IU/L (ref 44–121)
BUN/Creatinine Ratio: 16 (ref 9–23)
BUN: 14 mg/dL (ref 6–24)
Bilirubin Total: 0.6 mg/dL (ref 0.0–1.2)
CO2: 22 mmol/L (ref 20–29)
Calcium: 9.8 mg/dL (ref 8.7–10.2)
Chloride: 100 mmol/L (ref 96–106)
Creatinine, Ser: 0.85 mg/dL (ref 0.57–1.00)
Globulin, Total: 2.4 g/dL (ref 1.5–4.5)
Glucose: 153 mg/dL — ABNORMAL HIGH (ref 70–99)
Potassium: 4.4 mmol/L (ref 3.5–5.2)
Sodium: 139 mmol/L (ref 134–144)
Total Protein: 7.4 g/dL (ref 6.0–8.5)
eGFR: 84 mL/min/{1.73_m2} (ref >59)

## 2022-01-22 LAB — VITAMIN D 25 HYDROXY (VIT D DEFICIENCY, FRACTURES): Vit D, 25-Hydroxy: 38.6 ng/mL (ref 30.0–100.0)

## 2022-01-22 LAB — LIPID PANEL WITH LDL/HDL RATIO
Cholesterol, Total: 211 mg/dL — ABNORMAL HIGH (ref 100–199)
HDL: 80 mg/dL (ref >39)
LDL Chol Calc (NIH): 115 mg/dL — ABNORMAL HIGH (ref 0–99)
LDL/HDL Ratio: 1.4 ratio (ref 0.0–3.2)
Triglycerides: 90 mg/dL (ref 0–149)
VLDL Cholesterol Cal: 16 mg/dL (ref 5–40)

## 2022-01-22 LAB — INSULIN, RANDOM: INSULIN: 12.1 u[IU]/mL (ref 2.6–24.9)

## 2022-01-22 NOTE — Progress Notes (Signed)
Chief Complaint:   OBESITY Jillian Green is here to discuss her progress with her obesity treatment plan along with follow-up of her obesity related diagnoses. Jillian Green is on the Category 3 Plan or keeping a food journal and adhering to recommended goals of 1500 calories and 95 grams of protein daily and states she is following her eating plan approximately 70% of the time. Jillian Green states she is walking for 45 minutes 1 time per week.  Today's visit was #: 30 Starting weight: 184 lbs Starting date: 04/29/2018 Today's weight: 161 lbs Today's date: 01/21/2022 Total lbs lost to date: 23 Total lbs lost since last in-office visit: 7  Interim History: Jillian Green continues to do well with weight loss.  She is meeting her fat percentage goals, her visceral fat rating goals, and her muscle mass goals.  She is close to maintenance now.  Subjective:   1. Other hyperlipidemia Jillian Green is on statin, with no side effects noted.  She is due for labs, and she is following a low-cholesterol diet.  2. Type 2 diabetes mellitus without complication, without long-term current use of insulin ([place]) Jillian Green is stable on her medications.  She denies signs of hypoglycemia.  She is due to have labs done.  3. Vitamin D deficiency Jillian Green has been off vitamin D, and she is due for labs.  Assessment/Plan:   1. Other hyperlipidemia We will check labs today.  Jillian Green is okay to start CO-Q10 OTC.  - Lipid Panel With LDL/HDL Ratio  2. Type 2 diabetes mellitus without complication, without long-term current use of insulin (HCC) We will check labs today.  Jillian Green will continue Jardiance, and we will refill for 90 days.  - CMP14+EGFR - Insulin, random - Hemoglobin A1c  3. Vitamin D deficiency We will check labs today. Jillian Green will follow-up for routine testing of Vitamin D, at least 2-3 times per year to avoid over-replacement.  - VITAMIN D 25 Hydroxy (Vit-D Deficiency, Fractures)  4. Obesity, Current  BMI 26.9 Jillian Green is currently  in the action stage of change. As such, her goal is to continue with weight loss efforts. She has agreed to the Category 3 Plan.   Exercise goals: As is.   Behavioral modification strategies: increasing lean protein intake and meal planning and cooking strategies.  Jillian Green has agreed to follow-up with our clinic in 8 to 10 weeks. She was informed of the importance of frequent follow-up visits to maximize her success with intensive lifestyle modifications for her multiple health conditions.   Jillian Green was informed we would discuss her lab results at her next visit unless there is a critical issue that needs to be addressed sooner. Jillian Green agreed to keep her next visit at the agreed upon time to discuss these results.  Objective:   Blood pressure 127/81, pulse 95, temperature 98.3 F (36.8 C), height 5' 5"  (1.651 m), weight 161 lb 12.8 oz (73.4 kg), last menstrual period 09/12/2018, SpO2 96 %. Body mass index is 26.92 kg/m.  General: Cooperative, alert, well developed, in no acute distress. HEENT: Conjunctivae and lids unremarkable. Cardiovascular: Regular rhythm.  Lungs: Normal work of breathing. Neurologic: No focal deficits.   Lab Results  Component Value Date   CREATININE 0.85 01/21/2022   BUN 14 01/21/2022   NA 139 01/21/2022   K 4.4 01/21/2022   CL 100 01/21/2022   CO2 22 01/21/2022   Lab Results  Component Value Date   ALT 22 01/21/2022   AST 18 01/21/2022   ALKPHOS 109 01/21/2022  BILITOT 0.6 01/21/2022   Lab Results  Component Value Date   HGBA1C 6.4 (H) 01/21/2022   HGBA1C 6.2 (H) 07/01/2021   HGBA1C 6.1 (H) 02/18/2021   HGBA1C 5.8 (H) 06/18/2020   HGBA1C 6.0 (H) 02/17/2020   Lab Results  Component Value Date   INSULIN 12.1 01/21/2022   INSULIN 10.1 07/01/2021   INSULIN 7.6 02/18/2021   INSULIN 14.0 06/18/2020   INSULIN 9.9 02/17/2020   Lab Results  Component Value Date   TSH 0.770 04/29/2018   Lab Results  Component Value Date   CHOL 211 (H) 01/21/2022    HDL 80 01/21/2022   LDLCALC 115 (H) 01/21/2022   TRIG 90 01/21/2022   CHOLHDL 2.8 02/18/2021   Lab Results  Component Value Date   VD25OH 38.6 01/21/2022   VD25OH 43.5 07/01/2021   VD25OH 41.1 02/18/2021   Lab Results  Component Value Date   WBC 6.8 04/29/2018   HGB 13.7 04/29/2018   HCT 42.3 04/29/2018   MCV 95 04/29/2018   PLT 305 10/23/2016   Lab Results  Component Value Date   IRON 156 (H) 09/23/2013   FERRITIN 103.5 09/23/2013   Attestation Statements:   Reviewed by clinician on day of visit: allergies, medications, problem list, medical history, surgical history, family history, social history, and previous encounter notes.   I, Trixie Dredge, am acting as transcriptionist for Dennard Nip, MD.  I have reviewed the above documentation for accuracy and completeness, and I agree with the above. -  Dennard Nip, MD

## 2022-02-06 MED ORDER — EMPAGLIFLOZIN 25 MG PO TABS: 25.0000 mg | ORAL_TABLET | Freq: Every day | ORAL | 0 refills | Status: DC

## 2022-02-07 ENCOUNTER — Other Ambulatory Visit: Payer: Self-pay | Admitting: Family Medicine

## 2022-02-07 DIAGNOSIS — E1169 Type 2 diabetes mellitus with other specified complication: Secondary | ICD-10-CM

## 2022-02-12 ENCOUNTER — Encounter: Payer: Self-pay | Admitting: Family Medicine

## 2022-02-12 DIAGNOSIS — F988 Other specified behavioral and emotional disorders with onset usually occurring in childhood and adolescence: Secondary | ICD-10-CM

## 2022-02-12 MED ORDER — LISDEXAMFETAMINE DIMESYLATE 60 MG PO CAPS: 60.0000 mg | ORAL_CAPSULE | ORAL | 0 refills | Status: DC

## 2022-02-12 NOTE — Telephone Encounter (Signed)
PDMP okay, Rx sent 

## 2022-02-12 NOTE — Telephone Encounter (Signed)
Requesting: Vyvanse 60mg   Contract: 02/01/21 UDS: 02/01/21 Last Visit: 09/02/21 Next Visit: None Last Refill: 01/06/22 #30 and 0RF  NEW PH[facility]  Please Advise

## 2022-02-14 ENCOUNTER — Other Ambulatory Visit (INDEPENDENT_AMBULATORY_CARE_PROVIDER_SITE_OTHER): Payer: Self-pay | Admitting: Family Medicine

## 2022-02-14 DIAGNOSIS — F3289 Other specified depressive episodes: Secondary | ICD-10-CM

## 2022-02-20 ENCOUNTER — Other Ambulatory Visit: Payer: Self-pay | Admitting: Family Medicine

## 2022-02-20 ENCOUNTER — Other Ambulatory Visit (INDEPENDENT_AMBULATORY_CARE_PROVIDER_SITE_OTHER): Payer: Self-pay | Admitting: Family Medicine

## 2022-02-20 DIAGNOSIS — E1169 Type 2 diabetes mellitus with other specified complication: Secondary | ICD-10-CM

## 2022-02-20 DIAGNOSIS — F3289 Other specified depressive episodes: Secondary | ICD-10-CM

## 2022-03-06 ENCOUNTER — Ambulatory Visit (INDEPENDENT_AMBULATORY_CARE_PROVIDER_SITE_OTHER): Payer: 59 | Admitting: Psychology

## 2022-03-06 DIAGNOSIS — F4323 Adjustment disorder with mixed anxiety and depressed mood: Secondary | ICD-10-CM | POA: Diagnosis not present
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
## 2022-03-07 ENCOUNTER — Other Ambulatory Visit (INDEPENDENT_AMBULATORY_CARE_PROVIDER_SITE_OTHER): Payer: Self-pay | Admitting: Family Medicine

## 2022-03-07 DIAGNOSIS — F3289 Other specified depressive episodes: Secondary | ICD-10-CM

## 2022-03-10 ENCOUNTER — Encounter (INDEPENDENT_AMBULATORY_CARE_PROVIDER_SITE_OTHER): Payer: Self-pay | Admitting: Family Medicine

## 2022-03-10 ENCOUNTER — Other Ambulatory Visit (INDEPENDENT_AMBULATORY_CARE_PROVIDER_SITE_OTHER): Payer: Self-pay | Admitting: Family Medicine

## 2022-03-10 DIAGNOSIS — F3289 Other specified depressive episodes: Secondary | ICD-10-CM

## 2022-03-11 ENCOUNTER — Other Ambulatory Visit (INDEPENDENT_AMBULATORY_CARE_PROVIDER_SITE_OTHER): Payer: Self-pay

## 2022-03-11 DIAGNOSIS — F3289 Other specified depressive episodes: Secondary | ICD-10-CM

## 2022-03-11 MED ORDER — BUPROPION HCL ER (XL) 300 MG PO TB24: 300.0000 mg | ORAL_TABLET | Freq: Every day | ORAL | 0 refills | Status: DC

## 2022-03-11 NOTE — Telephone Encounter (Signed)
LAST APPOINTMENT DATE: 01/21/22 NEXT APPOINTMENT DATE:04/01/22   [id number] [place] [place] - [address] [place] [address] Phone: [phone] Fax: [phone]  [id number] - [address], [place] - [address] [place] [address] Phone: [phone] Fax: [phone]  Patient is requesting a refill of the following medications: No prescriptions requested or ordered in this encounter   Date last filled: 11/18/21 #90 Previously prescribed by Dr Dalbert Garnet  Lab Results      Component                Value               Date                      HGBA1C                   6.4 (H)             01/21/2022                HGBA1C                   6.2 (H)             07/01/2021                HGBA1C                   6.1 (H)             02/18/2021           Lab Results      Component                Value               Date                      LDLCALC                  115 (H)             01/21/2022                CREATININE               0.85                01/21/2022           Lab Results      Component                Value               Date                      VD25OH                   38.6                01/21/2022                VD25OH                   43.5                07/01/2021                VD25OH  41.1                02/18/2021            BP Readings from Last 3 Encounters: 01/21/22 : 127/81 11/18/21 : 114/80 08/19/21 : 109/74

## 2022-03-11 NOTE — Telephone Encounter (Signed)
Please send in a rf x 1 month

## 2022-03-11 NOTE — Telephone Encounter (Signed)
[facility] make [facility] [address]

## 2022-03-12 ENCOUNTER — Encounter (INDEPENDENT_AMBULATORY_CARE_PROVIDER_SITE_OTHER): Payer: Self-pay

## 2022-03-18 ENCOUNTER — Ambulatory Visit (INDEPENDENT_AMBULATORY_CARE_PROVIDER_SITE_OTHER): Payer: BC Managed Care – PPO | Admitting: Family Medicine

## 2022-03-20 ENCOUNTER — Ambulatory Visit (INDEPENDENT_AMBULATORY_CARE_PROVIDER_SITE_OTHER): Payer: 59 | Admitting: Psychology

## 2022-03-20 ENCOUNTER — Encounter: Payer: Self-pay | Admitting: Family Medicine

## 2022-03-20 DIAGNOSIS — F4323 Adjustment disorder with mixed anxiety and depressed mood: Secondary | ICD-10-CM | POA: Diagnosis not present

## 2022-03-20 DIAGNOSIS — F988 Other specified behavioral and emotional disorders with onset usually occurring in childhood and adolescence: Secondary | ICD-10-CM
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
## 2022-03-21 MED ORDER — LISDEXAMFETAMINE DIMESYLATE 60 MG PO CAPS: 60.0000 mg | ORAL_CAPSULE | ORAL | 0 refills | Status: DC

## 2022-03-21 NOTE — Telephone Encounter (Signed)
Requesting: [facility] Contract: 02/01/2021 UDS: 02/01/2021 Last OV: 09/02/2021--VV Next OV: N/A Last Refill: 02/12/2022, #30--0 RF Database:   Please advise

## 2022-03-25 ENCOUNTER — Ambulatory Visit (INDEPENDENT_AMBULATORY_CARE_PROVIDER_SITE_OTHER): Payer: 59 | Admitting: Psychology

## 2022-03-25 DIAGNOSIS — F4323 Adjustment disorder with mixed anxiety and depressed mood: Secondary | ICD-10-CM

## 2022-03-25 NOTE — Progress Notes (Signed)
[facility] Counselor/Therapist Progress Note  Patient ID: NATE PERRI, MRN: [id number],    Date: 03/25/2022  Time Spent: 45 mins  Treatment Type: Individual Therapy  Reported Symptoms: Pt presents for session, via webex video.  Pt grants consent for the session, stating she is in her office with no one else present.  I shared with pt that I am in my office with no one else here either.  Mental Status Exam: Appearance:  Casual     Behavior: Appropriate  Motor: Normal  Speech/Language:  Clear and Coherent  Affect: Appropriate  Mood: normal  Thought process: normal  Thought content:   WNL  Sensory/Perceptual disturbances:   WNL  Orientation: oriented to person, place, and time/date  Attention: Good  Concentration: Good  Memory: WNL  Fund of knowledge:  Good  Insight:   Good  Judgment:  Good  Impulse Control: Good   Risk Assessment: Danger to Self:  No Self-injurious Behavior: No Danger to Others: No Duty to Warn:no Physical Aggression / Violence:No  Access to Firearms a concern: No  Gang Involvement:No   Subjective: Pt shares that "I am doing pretty good."  Pt shares that they got into the building yesterday and she is glad about that.  Thayer Ohm went to see [name] on Saturday and talked with her about her pregnancy.  Both he and [name] thought the conversation went pretty well.  Pt shares that he was very engaged with [name] when she was a baby."  He has agreed to stay in their home, in case [name] needs to come home with the baby.  He did not try to talk her into or out of anything."  Thayer Ohm asked pt on Saturday if she was coming back or not and she told him she was not coming back.  They had an adult conversation about their struggles and he agreed that he had not been happy either.  Pt shares that at the end of this school year she is planning to move to [place] for a relationship [name]); she shares that she never cheated on [place].  She has reconnected to  with a college friend [name]) who is now living in [place]; he has two grown children and is planning to separate from his wife; he is planning to be in his own place by 06/19/22.  Pt shares they have texted everyday since March of this year.  Pt is aware that, if she does not feel good about their situation in the Spring, she will not going to go.  If she feels good about where they are then, she will go and see how the relationship unfolds.  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  It is getting better each passing day.  Pt shares that she has told her parents about her separation from [place] and they have both supported her.  Pt shares that her dogs have had a hard transition to her friend's home; she has decided to put them in crates during the day because they damaged a couple of things in the home.  Pt shares she went to the pool on Saturday and continues to walk the dogs, reading, listening to music and watching TV shows.  Encouraged pt to continue with her self care activities and we will meet next week for a follow up session.  Pt shares she "wants to talk more  next week about the [place] situation.  I want to be sure I am thinking clearly about it."  Interventions: Cognitive Behavioral Therapy  Diagnosis:Adjustment disorder with mixed anxiety and depressed mood  Plan: Treatment Plan Strengths/Abilities:  Intelligent, Intuitive, Willing to participate in therapy Treatment Preferences:  Outpatient Individual Therapy Statement of Needs:  Patient is to use CBT, mindfulness and coping skills to help manage and/or decrease symptoms associated with their diagnosis. Symptoms:  Depressed/Irritable mood, worry, social withdrawal Problems Addressed:  Depressive thoughts, Sadness, Sleep issues, etc. Long Term Goals:  Pt to reduce overall level, frequency, and intensity of the feelings of  depression/anxiety as evidenced by decreased irritability, negative self talk, and helpless feelings from 6 to 7 days/week to 0 to 1 days/week, per client report, for at least 3 consecutive months.  Progress: 20% Short Term Goals:  Pt to verbally express understanding of the relationship between feelings of depression/anxiety and their impact on thinking patterns and behaviors.  Pt to verbalize an understanding of the role that distorted thinking plays in creating fears, excessive worry, and ruminations.  Progress: 20% Target Date:  03/21/2023 Frequency:  Bi-weekly Modality:  Cognitive Behavioral Therapy Interventions by Therapist:  Therapist will use CBT, Mindfulness exercises, Coping skills and Referrals, as needed by client. Client has verbally approved this treatment plan.  Karie Kirks, [facility]

## 2022-03-27 ENCOUNTER — Telehealth: Payer: Self-pay | Admitting: Physician Assistant

## 2022-03-27 ENCOUNTER — Encounter: Payer: Self-pay | Admitting: Physician Assistant

## 2022-03-27 ENCOUNTER — Other Ambulatory Visit (INDEPENDENT_AMBULATORY_CARE_PROVIDER_SITE_OTHER): Payer: 59

## 2022-03-27 ENCOUNTER — Telehealth: Payer: Self-pay

## 2022-03-27 DIAGNOSIS — N390 Urinary tract infection, site not specified: Secondary | ICD-10-CM

## 2022-03-27 LAB — POCT URINALYSIS DIPSTICK
Bilirubin, UA: NEGATIVE
Glucose, UA: NEGATIVE
Ketones, UA: NEGATIVE
Nitrite, UA: NEGATIVE
Odor: NEGATIVE
Protein, UA: NEGATIVE
Spec Grav, UA: 1.015 (ref 1.010–1.025)
Urobilinogen, UA: 0.2 E.U./dL
pH, UA: 5 (ref 5.0–8.0)

## 2022-03-27 NOTE — Telephone Encounter (Signed)
Orders placed and lab appt made

## 2022-03-27 NOTE — Patient Instructions (Signed)
Having symptoms of recurring UTI needs in person for samples and maybe Urology referral if on going.

## 2022-03-27 NOTE — Telephone Encounter (Signed)
Pt is wanting medication for UTI, pt had a virtual visit today with Malva Cogan and he advised a lab appt , is it okay to schedule a Lab appointment for POCT UA and culture so she can get medication

## 2022-03-27 NOTE — Addendum Note (Signed)
Addended by: Mervin Kung A on: 03/27/2022 04:18 PM   Modules accepted: Orders

## 2022-03-27 NOTE — Progress Notes (Signed)
[place]   Recurring UTI in last month; needs sample and in person eval possible Urology referral  Patient acknowledged agreement and understanding of the plan.

## 2022-03-27 NOTE — Addendum Note (Signed)
Addended by: Maximino Sarin on: 03/27/2022 02:48 PM   Modules accepted: Orders

## 2022-03-28 ENCOUNTER — Encounter: Payer: Self-pay | Admitting: Family Medicine

## 2022-03-28 LAB — URINE CULTURE
MICRO NUMBER:: 13826757
SPECIMEN QUALITY:: ADEQUATE

## 2022-04-01 ENCOUNTER — Ambulatory Visit (INDEPENDENT_AMBULATORY_CARE_PROVIDER_SITE_OTHER): Payer: 59 | Admitting: Family Medicine

## 2022-04-01 VITALS — BP 111/76 | HR 94 | Temp 97.8°F | Ht 65.0 in | Wt 156.0 lb

## 2022-04-01 DIAGNOSIS — E7849 Other hyperlipidemia: Secondary | ICD-10-CM

## 2022-04-01 DIAGNOSIS — E669 Obesity, unspecified: Secondary | ICD-10-CM | POA: Diagnosis not present

## 2022-04-01 DIAGNOSIS — E119 Type 2 diabetes mellitus without complications: Secondary | ICD-10-CM

## 2022-04-01 DIAGNOSIS — Z6826 Body mass index (BMI) 26.0-26.9, adult: Secondary | ICD-10-CM | POA: Diagnosis not present

## 2022-04-01 DIAGNOSIS — Z6830 Body mass index (BMI) 30.0-30.9, adult: Secondary | ICD-10-CM

## 2022-04-01 DIAGNOSIS — Z7984 Long term (current) use of oral hypoglycemic drugs: Secondary | ICD-10-CM

## 2022-04-02 ENCOUNTER — Other Ambulatory Visit (INDEPENDENT_AMBULATORY_CARE_PROVIDER_SITE_OTHER): Payer: Self-pay | Admitting: Family Medicine

## 2022-04-02 DIAGNOSIS — F3289 Other specified depressive episodes: Secondary | ICD-10-CM

## 2022-04-03 ENCOUNTER — Ambulatory Visit (INDEPENDENT_AMBULATORY_CARE_PROVIDER_SITE_OTHER): Payer: 59 | Admitting: Psychology

## 2022-04-03 DIAGNOSIS — F4323 Adjustment disorder with mixed anxiety and depressed mood: Secondary | ICD-10-CM

## 2022-04-03 NOTE — Progress Notes (Signed)
[facility] Counselor/Therapist Progress Note  Patient ID: Jillian Green, MRN: [id number],    Date: 04/03/2022  Time Spent: 45 mins  Treatment Type: Individual Therapy  Reported Symptoms: Pt presents for session, via webex video.  Pt grants consent for the session, stating she is in her office with no one else present.  I shared with pt that I am in my office with no one else here either.  Mental Status Exam: Appearance:  Casual     Behavior: Appropriate  Motor: Normal  Speech/Language:  Clear and Coherent  Affect: Appropriate  Mood: normal  Thought process: normal  Thought content:   WNL  Sensory/Perceptual disturbances:   WNL  Orientation: oriented to person, place, and time/date  Attention: Good  Concentration: Good  Memory: WNL  Fund of knowledge:  Good  Insight:   Good  Judgment:  Good  Impulse Control: Good   Risk Assessment: Danger to Self:  No Self-injurious Behavior: No Danger to Others: No Duty to Warn:no Physical Aggression / Violence:No  Access to Firearms a concern: No  Gang Involvement:No   Subjective: Pt shares that "I am doing OK; I seem to be plugging along."  Pt shares that school is going well.  She again shares that she wants to talk about Jillian Green.  He lives 3 hrs Jillian Green.  They dated shortly in college and drifted apart.  She reconnected with him by phone/text in March of this year.  They talked from time to time but did not meet again until after she and Jillian Green split up.  When they reconnected they had a great time.  He is still married but is planning to leave his wife; "I do not know why he is still with her because she is a bitch to him."  Jillian Green tells her he is leaving her on 06/19/22 and will be moving into a house owned by his parents.  Pt shares that she still gets angry that he is still with her.  Encouraged pt to consider the topic of "Giving Jillian Green" to Jillian Green in this circumstance; encouraged her to think about how that  might look, if she chose to practice it; how would she do it?  Asked pt about similarities between Jillian Green; she indicates that both men are very black and white.  She shares that when Jillian Green makes up his mind about something, he never changes it.  Jillian Green is willing to think through issues that they discuss and consider her perspectives.  Jillian Green works for Jillian Green in [place].  He is a Restaurant manager, fast food and is up for volunteer of the year with them.  He hunts and fishes and boats.  He has a son and daughter; both of them are grown; both kids have talked to him about him leaving his wife because they see the difficulty of the relationship for both parents.  Pt shares that she is able to talk with Jillian Green so much easier than she can with Jillian Green.  Jillian Green is better at expressing his feeling to pt; pt shares she still does not know how Jillian Green feels about her having left; "he has never told me even though I have asked him."  Pt describes Jillian Green as being very consistent with his feelings.  She shares they have similar spiritual beliefs.  Encouraged pt to continue with her self care activities and we will meet in 2 wks for a follow up session.  Interventions: Cognitive Behavioral Therapy  Diagnosis:Adjustment disorder with mixed anxiety  and depressed mood  Plan: Treatment Plan Strengths/Abilities:  Intelligent, Intuitive, Willing to participate in therapy Treatment Preferences:  Outpatient Individual Therapy Statement of Needs:  Patient is to use CBT, mindfulness and coping skills to help manage and/or decrease symptoms associated with their diagnosis. Symptoms:  Depressed/Irritable mood, worry, social withdrawal Problems Addressed:  Depressive thoughts, Sadness, Sleep issues, etc. Long Term Goals:  Pt to reduce overall level, frequency, and intensity of the feelings of depression/anxiety as evidenced by decreased irritability, negative self talk, and helpless feelings from 6 to 7 days/week to 0 to 1 days/week, per  client report, for at least 3 consecutive months.  Progress: 20% Short Term Goals:  Pt to verbally express understanding of the relationship between feelings of depression/anxiety and their impact on thinking patterns and behaviors.  Pt to verbalize an understanding of the role that distorted thinking plays in creating fears, excessive worry, and ruminations.  Progress: 20% Target Date:  03/21/2023 Frequency:  Bi-weekly Modality:  Cognitive Behavioral Therapy Interventions by Therapist:  Therapist will use CBT, Mindfulness exercises, Coping skills and Referrals, as needed by client. Client has verbally approved this treatment plan.  Karie Kirks, [facility]

## 2022-04-08 ENCOUNTER — Encounter: Payer: Self-pay | Admitting: Family Medicine

## 2022-04-08 NOTE — Progress Notes (Signed)
Chief Complaint:   OBESITY Jillian Green is here to discuss her progress with her obesity treatment plan along with follow-up of her obesity related diagnoses. Jillian Green is on the Category 3 Plan and states she is following her eating plan approximately 80% of the time. Jillian Green states she is walking for 30-45 minutes 5 times per week.  Today's visit was #: 37 Starting weight: 184 lbs Starting date: 04/29/2018 Today's weight: 156 lbs Today's date: 04/01/2022 Total lbs lost to date: 28 Total lbs lost since last in-office visit: 5  Interim History: Jillian Green continues to do well with her weight loss, and she is very close to her weight loss goal.   Subjective:   1. Other hyperlipidemia Jillian Green was on atorvastatin and she noted muscle pains in her legs. She has been on CoQ10 400 mcg daily for 2 years, but with minimal improvement.   2. Type 2 diabetes mellitus without complication, without long-term current use of insulin (HCC) Jillian Green's fasting blood sugars are not yet at goal, but overall have improved.   Assessment/Plan:   1. Other hyperlipidemia Jillian Green is to continue to work on her diet and exercise. The importance of cholesterol control especially in diabetes mellitus were discussed with the patient.   2. Type 2 diabetes mellitus without complication, without long-term current use of insulin (HCC) The goal fasting and 2 hour postprandial control were discussed with the patient.   3. Obesity, Current  BMI 26.1 Jillian Green is currently in the action stage of change. As such, her goal is to continue with weight loss efforts. She has agreed to the Category 3 Plan.   Exercise goals: As is, add strengthening exercises.   Behavioral modification strategies: increasing lean protein intake.  Jillian Green has agreed to follow-up with our clinic in 6 to 8 weeks. She was informed of the importance of frequent follow-up visits to maximize her success with intensive lifestyle modifications for her multiple health  conditions.   Objective:   Blood pressure 111/76, pulse 94, temperature 97.8 F (36.6 C), height 5\' 5"  (1.651 m), weight 156 lb (70.8 kg), last menstrual period 09/12/2018, SpO2 98 %. Body mass index is 25.96 kg/m.  General: Cooperative, alert, well developed, in no acute distress. HEENT: Conjunctivae and lids unremarkable. Cardiovascular: Regular rhythm.  Lungs: Normal work of breathing. Neurologic: No focal deficits.   Lab Results  Component Value Date   CREATININE 0.85 01/21/2022   BUN 14 01/21/2022   NA 139 01/21/2022   K 4.4 01/21/2022   CL 100 01/21/2022   CO2 22 01/21/2022   Lab Results  Component Value Date   ALT 22 01/21/2022   AST 18 01/21/2022   ALKPHOS 109 01/21/2022   BILITOT 0.6 01/21/2022   Lab Results  Component Value Date   HGBA1C 6.4 (H) 01/21/2022   HGBA1C 6.2 (H) 07/01/2021   HGBA1C 6.1 (H) 02/18/2021   HGBA1C 5.8 (H) 06/18/2020   HGBA1C 6.0 (H) 02/17/2020   Lab Results  Component Value Date   INSULIN 12.1 01/21/2022   INSULIN 10.1 07/01/2021   INSULIN 7.6 02/18/2021   INSULIN 14.0 06/18/2020   INSULIN 9.9 02/17/2020   Lab Results  Component Value Date   TSH 0.770 04/29/2018   Lab Results  Component Value Date   CHOL 211 (H) 01/21/2022   HDL 80 01/21/2022   LDLCALC 115 (H) 01/21/2022   TRIG 90 01/21/2022   CHOLHDL 2.8 02/18/2021   Lab Results  Component Value Date   VD25OH 38.6 01/21/2022  VD25OH 43.5 07/01/2021   VD25OH 41.1 02/18/2021   Lab Results  Component Value Date   WBC 6.8 04/29/2018   HGB 13.7 04/29/2018   HCT 42.3 04/29/2018   MCV 95 04/29/2018   PLT 305 10/23/2016   Lab Results  Component Value Date   IRON 156 (H) 09/23/2013   FERRITIN 103.5 09/23/2013   Attestation Statements:   Reviewed by clinician on day of visit: allergies, medications, problem list, medical history, surgical history, family history, social history, and previous encounter notes.   I, Burt Knack, am acting as transcriptionist  for Quillian Quince, MD.  I have reviewed the above documentation for accuracy and completeness, and I agree with the above. -  Quillian Quince, MD

## 2022-04-09 ENCOUNTER — Telehealth: Payer: Self-pay

## 2022-04-09 NOTE — Telephone Encounter (Signed)
Error

## 2022-04-09 NOTE — Telephone Encounter (Signed)
Opened in error

## 2022-04-10 ENCOUNTER — Other Ambulatory Visit (INDEPENDENT_AMBULATORY_CARE_PROVIDER_SITE_OTHER): Payer: Self-pay | Admitting: Family Medicine

## 2022-04-10 DIAGNOSIS — F3289 Other specified depressive episodes: Secondary | ICD-10-CM

## 2022-04-11 ENCOUNTER — Telehealth: Payer: Self-pay

## 2022-04-11 NOTE — Telephone Encounter (Signed)
PA initiated via Covermymeds; KEY: [id number]. Awaiting determination.

## 2022-04-14 NOTE — Telephone Encounter (Signed)
PA approved. Effective 04/11/2022 to 04/10/2025

## 2022-04-15 ENCOUNTER — Other Ambulatory Visit (INDEPENDENT_AMBULATORY_CARE_PROVIDER_SITE_OTHER): Payer: Self-pay | Admitting: Family Medicine

## 2022-04-15 DIAGNOSIS — F3289 Other specified depressive episodes: Secondary | ICD-10-CM

## 2022-04-16 ENCOUNTER — Ambulatory Visit (INDEPENDENT_AMBULATORY_CARE_PROVIDER_SITE_OTHER): Payer: 59 | Admitting: Psychology

## 2022-04-16 DIAGNOSIS — F4323 Adjustment disorder with mixed anxiety and depressed mood: Secondary | ICD-10-CM
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
## 2022-04-17 ENCOUNTER — Encounter: Payer: Self-pay | Admitting: Family Medicine

## 2022-04-17 ENCOUNTER — Other Ambulatory Visit (INDEPENDENT_AMBULATORY_CARE_PROVIDER_SITE_OTHER): Payer: Self-pay | Admitting: Family Medicine

## 2022-04-17 ENCOUNTER — Encounter (INDEPENDENT_AMBULATORY_CARE_PROVIDER_SITE_OTHER): Payer: Self-pay | Admitting: Family Medicine

## 2022-04-17 DIAGNOSIS — F3289 Other specified depressive episodes: Secondary | ICD-10-CM

## 2022-04-18 ENCOUNTER — Other Ambulatory Visit: Payer: Self-pay | Admitting: Family Medicine

## 2022-04-18 DIAGNOSIS — F3289 Other specified depressive episodes: Secondary | ICD-10-CM

## 2022-04-18 MED ORDER — BUPROPION HCL ER (XL) 300 MG PO TB24: 300.0000 mg | ORAL_TABLET | Freq: Every day | ORAL | 3 refills | Status: DC

## 2022-05-13 ENCOUNTER — Encounter: Payer: Self-pay | Admitting: Family Medicine

## 2022-05-13 DIAGNOSIS — F988 Other specified behavioral and emotional disorders with onset usually occurring in childhood and adolescence: Secondary | ICD-10-CM

## 2022-05-14 MED ORDER — LISDEXAMFETAMINE DIMESYLATE 60 MG PO CAPS: 60.0000 mg | ORAL_CAPSULE | ORAL | 0 refills | Status: DC

## 2022-05-14 NOTE — Telephone Encounter (Signed)
Requesting: [place]  Contract: 02/01/2021  UDS: 02/01/2021 Last OV: 09/02/2021--virtual visit Next OV: N/A Last Refill: 03/21/2022, #30--0RF Database:   Please advise

## 2022-05-15 ENCOUNTER — Ambulatory Visit (INDEPENDENT_AMBULATORY_CARE_PROVIDER_SITE_OTHER): Payer: BC Managed Care – PPO | Admitting: Psychology

## 2022-05-15 DIAGNOSIS — F4323 Adjustment disorder with mixed anxiety and depressed mood: Secondary | ICD-10-CM
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
## 2022-05-16 ENCOUNTER — Other Ambulatory Visit: Payer: Self-pay | Admitting: Family Medicine

## 2022-05-16 DIAGNOSIS — M797 Fibromyalgia: Secondary | ICD-10-CM

## 2022-05-20 ENCOUNTER — Telehealth (INDEPENDENT_AMBULATORY_CARE_PROVIDER_SITE_OTHER): Payer: BC Managed Care – PPO | Admitting: Family Medicine

## 2022-05-20 ENCOUNTER — Encounter (INDEPENDENT_AMBULATORY_CARE_PROVIDER_SITE_OTHER): Payer: Self-pay | Admitting: Family Medicine

## 2022-05-20 VITALS — BP 112/69 | HR 100 | Temp 98.3°F | Ht 65.0 in | Wt 155.0 lb

## 2022-05-20 DIAGNOSIS — F3289 Other specified depressive episodes: Secondary | ICD-10-CM | POA: Diagnosis not present

## 2022-05-20 DIAGNOSIS — Z6825 Body mass index (BMI) 25.0-25.9, adult: Secondary | ICD-10-CM | POA: Diagnosis not present

## 2022-05-20 DIAGNOSIS — E1169 Type 2 diabetes mellitus with other specified complication: Secondary | ICD-10-CM

## 2022-05-20 DIAGNOSIS — Z7984 Long term (current) use of oral hypoglycemic drugs: Secondary | ICD-10-CM

## 2022-05-20 DIAGNOSIS — E669 Obesity, unspecified: Secondary | ICD-10-CM | POA: Insufficient documentation

## 2022-05-20 MED ORDER — METFORMIN HCL 500 MG PO TABS: 1000.0000 mg | ORAL_TABLET | Freq: Two times a day (BID) | ORAL | 1 refills | Status: DC

## 2022-05-20 MED ORDER — ONETOUCH DELICA LANCETS 30G MISC: 1.0000 | Freq: Every day | 0 refills | Status: AC

## 2022-05-20 MED ORDER — RYBELSUS 3 MG PO TABS: 3.0000 mg | ORAL_TABLET | Freq: Every day | ORAL | 0 refills | Status: DC

## 2022-05-20 MED ORDER — ONETOUCH ULTRA VI STRP: ORAL_STRIP | 0 refills | Status: AC

## 2022-05-21 ENCOUNTER — Telehealth (INDEPENDENT_AMBULATORY_CARE_PROVIDER_SITE_OTHER): Payer: Self-pay | Admitting: Family Medicine

## 2022-05-21 ENCOUNTER — Encounter (INDEPENDENT_AMBULATORY_CARE_PROVIDER_SITE_OTHER): Payer: Self-pay

## 2022-05-21 NOTE — Telephone Encounter (Signed)
Dr. Leafy Ro - Prior authorization approved for [employer]. Effective: 05/21/2022 to 05/20/2023. Patient sent approval message via mychart.

## 2022-05-23 ENCOUNTER — Ambulatory Visit: Payer: BC Managed Care – PPO | Admitting: Family Medicine

## 2022-05-23 ENCOUNTER — Encounter: Payer: Self-pay | Admitting: Family Medicine

## 2022-05-23 VITALS — BP 130/88 | HR 94 | Temp 98.7°F | Resp 18 | Ht 65.0 in | Wt 160.8 lb

## 2022-05-23 DIAGNOSIS — J0140 Acute pansinusitis, unspecified: Secondary | ICD-10-CM

## 2022-05-23 DIAGNOSIS — F419 Anxiety disorder, unspecified: Secondary | ICD-10-CM | POA: Diagnosis not present

## 2022-05-23 DIAGNOSIS — J302 Other seasonal allergic rhinitis: Secondary | ICD-10-CM

## 2022-05-23 DIAGNOSIS — F988 Other specified behavioral and emotional disorders with onset usually occurring in childhood and adolescence: Secondary | ICD-10-CM

## 2022-05-23 LAB — POC COVID19 BINAXNOW: SARS Coronavirus 2 Ag: NEGATIVE

## 2022-05-23 MED ORDER — DOXYCYCLINE HYCLATE 100 MG PO TABS: 100.0000 mg | ORAL_TABLET | Freq: Two times a day (BID) | ORAL | 0 refills | Status: DC

## 2022-05-23 MED ORDER — LISDEXAMFETAMINE DIMESYLATE 60 MG PO CAPS: 60.0000 mg | ORAL_CAPSULE | ORAL | 0 refills | Status: DC

## 2022-05-23 MED ORDER — ALPRAZOLAM 0.5 MG PO TABS: 0.5000 mg | ORAL_TABLET | Freq: Three times a day (TID) | ORAL | 1 refills | Status: DC | PRN

## 2022-05-23 MED ORDER — LEVOCETIRIZINE DIHYDROCHLORIDE 5 MG PO TABS: 5.0000 mg | ORAL_TABLET | Freq: Every evening | ORAL | 3 refills | Status: DC

## 2022-05-23 MED ORDER — FLUTICASONE PROPIONATE 50 MCG/ACT NA SUSP: 2.0000 | Freq: Every day | NASAL | 6 refills | Status: DC

## 2022-05-23 MED ORDER — FLUCONAZOLE 150 MG PO TABS: ORAL_TABLET | ORAL | 0 refills | Status: DC

## 2022-05-23 MED ORDER — PROMETHAZINE-DM 6.25-15 MG/5ML PO SYRP: 5.0000 mL | ORAL_SOLUTION | Freq: Four times a day (QID) | ORAL | 0 refills | Status: DC | PRN

## 2022-05-23 NOTE — Patient Instructions (Signed)

## 2022-05-23 NOTE — Progress Notes (Signed)
Subjective:   By signing my name below, I, Roma Schanz, attest that this documentation has been prepared under the direction and in the presence of Roma Schanz, 05/23/2022.   Patient ID: Jillian Green, female    DOB: Sep 24, 1970, 51 y.o.   MRN: [id number]  No chief complaint on file.   HPI Patient is in today for an office visit.  Sinus issues- Patient is complaining of congestion, sinus pressure, sore throat, and productive cough with green/yellow sputum starting two days ago and takes no OTC medications to manage symptoms. She denies any fever.  ADHD- She is currently taking 60 mg Vyvanse medication.  Mood- She is currently taking .5 mg Xanax for her mood.  Health Maintenance Due  Topic Date Due   FOOT EXAM  Never done   OPHTHALMOLOGY EXAM  Never done   HIV Screening  Never done   Diabetic kidney evaluation - Urine ACR  Never done   Hepatitis C Screening  Never done   COLONOSCOPY (Pts 45-33yr Insurance coverage will need to be confirmed)  Never done   COVID-19 Vaccine (2 - Janssen risk series) 11/09/2019   PAP SMEAR-Modifier  01/03/2020   TETANUS/TDAP  02/12/2022   INFLUENZA VACCINE  Never done    Past Medical History:  Diagnosis Date   ADD (attention deficit disorder)    Anxiety    Back pain    Chronic headaches    Depression    Fibromyalgia    GERD (gastroesophageal reflux disease)    Itchy skin    Joint pain    Lactose intolerance    Migraine    Sweating increase    Type 2 diabetes mellitus without complication, without long-term current use of insulin ([place] 09/13/2018   Unspecified eustachian tube disorder     Past Surgical History:  Procedure Laterality Date   CESAREAN SECTION     OTHER SURGICAL HISTORY     preforated uterine surgery   SINUS EXPLORATION      Family History  Problem Relation Age of Onset   Other Father        Dyslexia   Heart disease Father    Hypertension Father    Alcoholism Father    Hypertension Mother     Thyroid disease Mother    Anxiety disorder Mother    Sleep apnea Mother    Obesity Mother    ADD / ADHD Sister     Social History   Socioeconomic History   Marital status: Married    Spouse name: cHaematologist  Number of children: 1   Years of education: Not on file   Highest education level: Not on file  Occupational History   Occupation: middle fPhysiological scientist   [place] [employer] Tobacco Use   Smoking status: Former    Years: 8.00    Types: Cigarettes    Quit date: 05/15/2006    Years since quitting: 16.0   Smokeless tobacco: Never   Tobacco comments:    1 cig every 4 months  Substance and Sexual Activity   Alcohol use: Yes    Alcohol/week: 1.0 standard drink of alcohol    Types: 1 Glasses of wine per week   Drug use: No   Sexual activity: Yes    Partners: Male  Other Topics Concern   Not on file  Social History Narrative   Exercise--  Yes --until she had stress fracture   Social Determinants of HRadio broadcast assistant  Strain: Not on file  Food Insecurity: Not on file  Transportation Needs: Not on file  Physical Activity: Not on file  Stress: Not on file  Social Connections: Not on file  Intimate Partner Violence: Not on file    Outpatient Medications Prior to Visit  Medication Sig Dispense Refill   ALPRAZolam (XANAX) 0.5 MG tablet Take 1 tablet (0.5 mg total) by mouth 3 (three) times daily as needed for anxiety. 45 tablet 1   atorvastatin (LIPITOR) 10 MG tablet Take 1 tablet (10 mg total) by mouth daily. 90 tablet 0   azelastine (OPTIVAR) 0.05 % ophthalmic solution Place 1 drop into both eyes 2 (two) times daily as needed. 18 mL 1   BD PEN NEEDLE NANO U/F 32G X 4 MM MISC USE TWICE A DAY 100 each 0   blood glucose meter kit and supplies Dispense based on patient and insurance preference. Use up to four times daily as directed. (FOR ICD-10 E10.9, E11.9). 1 each 0   buPROPion (WELLBUTRIN XL) 300 MG 24 hr tablet Take 1 tablet (300 mg  total) by mouth daily. 90 tablet 3   DULoxetine (CYMBALTA) 60 MG capsule TAKE 1 CAPSULE BY MOUTH TWICE A DAY 180 capsule 1   ergocalciferol (VITAMIN D2) 1.25 MG (50000 UT) capsule Take by mouth.     fluticasone (FLONASE) 50 MCG/ACT nasal spray SPRAY 2 SPRAYS INTO EACH NOSTRIL EVERY DAY 48 mL 2   glucose blood (ONETOUCH ULTRA) test strip USE AS INSTRUCTED 100 strip 0   hydrOXYzine (ATARAX) 25 MG tablet TAKE 1 TO 2 TABLETS BY MOUTH AT BEDTIME AS NEEDED FOR SLEEP 180 tablet 1   levocetirizine (XYZAL) 5 MG tablet Take 1 tablet (5 mg total) by mouth every evening. 90 tablet 3   lisdexamfetamine (VYVANSE) 60 MG capsule Take 1 capsule (60 mg total) by mouth every morning. 30 capsule 0   meloxicam (MOBIC) 15 MG tablet TAKE 1/2 TO 1 TABLET BY MOUTH DAILY AS NEEDED 30 tablet 2   metFORMIN (GLUCOPHAGE) 500 MG tablet Take 2 tablets (1,000 mg total) by mouth 2 (two) times daily. 360 tablet 1   OneTouch Delica Lancets 08Q MISC 1 Device by Does not apply route daily. 100 each 0   rizatriptan (MAXALT-MLT) 10 MG disintegrating tablet Take 1 tablet (10 mg total) by mouth as needed for migraine. May repeat in 2 hours if needed 10 tablet 4   Semaglutide (RYBELSUS) 3 MG TABS Take 3 mg by mouth daily. 30 tablet 0   No facility-administered medications prior to visit.    Allergies  Allergen Reactions   Augmentin [Amoxicillin-Pot Clavulanate]     Severe yeast infections    Review of Systems  Constitutional:  Negative for fever and malaise/fatigue.  HENT:  Negative for congestion.   Eyes:  Negative for blurred vision.  Respiratory:  Negative for shortness of breath.   Cardiovascular:  Negative for chest pain, palpitations and leg swelling.  Gastrointestinal:  Negative for abdominal pain, blood in stool and nausea.  Genitourinary:  Negative for dysuria and frequency.  Musculoskeletal:  Negative for falls.  Skin:  Negative for rash.  Neurological:  Negative for dizziness, loss of consciousness and headaches.   Endo/Heme/Allergies:  Negative for environmental allergies.  Psychiatric/Behavioral:  Negative for depression. The patient is not nervous/anxious.        Objective:    Physical Exam Vitals and nursing note reviewed.  Constitutional:      General: She is not in acute distress.  Appearance: Normal appearance. She is well-developed. She is not ill-appearing.  HENT:     Head: Normocephalic and atraumatic.     Right Ear: External ear normal.     Left Ear: External ear normal.  Eyes:     Extraocular Movements: Extraocular movements intact.     Conjunctiva/sclera: Conjunctivae normal.     Pupils: Pupils are equal, round, and reactive to light.  Neck:     Thyroid: No thyromegaly.     Vascular: No carotid bruit or JVD.  Cardiovascular:     Rate and Rhythm: Normal rate and regular rhythm.     Heart sounds: Normal heart sounds. No murmur heard.    No gallop.  Pulmonary:     Effort: Pulmonary effort is normal. No respiratory distress.     Breath sounds: Normal breath sounds. No wheezing or rales.  Chest:     Chest wall: No tenderness.  Musculoskeletal:     Cervical back: Normal range of motion and neck supple.  Skin:    General: Skin is warm and dry.  Neurological:     Mental Status: She is alert and oriented to person, place, and time.  Psychiatric:        Judgment: Judgment normal.     LMP 09/12/2018  Wt Readings from Last 3 Encounters:  05/20/22 155 lb (70.3 kg)  04/01/22 156 lb (70.8 kg)  01/21/22 161 lb 12.8 oz (73.4 kg)       Assessment & Plan:   Problem List Items Addressed This Visit   None  No orders of the defined types were placed in this encounter.   I, Roma Schanz, personally preformed the services described in this documentation.  All medical record entries made by the scribe were at my direction and in my presence.  I have reviewed the chart and discharge instructions (if applicable) and agree that the record reflects my personal performance  and is accurate and complete. 05/23/2022.  I,Verona Buck,acting as a Education administrator for [employer], DO.,have documented all relevant documentation on the behalf of Ann Held, DO,as directed by  Ann Held, DO while in the presence of Ann Held, DO.    Luna Glasgow

## 2022-05-25 LAB — DRUG MONITORING PANEL 376104, URINE
Amphetamine: 1468 ng/mL — ABNORMAL HIGH (ref <250)
Amphetamines: POSITIVE ng/mL — AB (ref <500)
Barbiturates: NEGATIVE ng/mL (ref <300)
Benzodiazepines: NEGATIVE ng/mL (ref <100)
Cocaine Metabolite: NEGATIVE ng/mL (ref <150)
Desmethyltramadol: NEGATIVE ng/mL (ref <100)
Methamphetamine: NEGATIVE ng/mL (ref <250)
Opiates: NEGATIVE ng/mL (ref <100)
Oxycodone: NEGATIVE ng/mL (ref <100)
Tramadol: NEGATIVE ng/mL (ref <100)

## 2022-05-25 LAB — DM TEMPLATE

## 2022-05-26 NOTE — Progress Notes (Signed)
Chief Complaint:   OBESITY Jillian Green is here to discuss her progress with her obesity treatment plan along with follow-up of her obesity related diagnoses. Jillian Green is on the Category 3 Plan and states she is following her eating plan approximately 95% of the time. Jillian Green states she is walking the stairs for 30 minutes 5 times per week.  Today's visit was #: 68 Starting weight: 184 lbs Starting date: 04/29/2018 Today's weight: 155 lbs Today's date: 05/20/2022 Total lbs lost to date: 29 Total lbs lost since last in-office visit: 1  Interim History: Jillian Green has done well with weight loss. She gets hungry at mealtime but no excessive hunger. She still notes some cravings of simple carbohydrates. She is maintaining, but concern as she has had to discontinue Jardiance.   Subjective:   1. Type 2 diabetes mellitus with other specified complication, without long-term current use of insulin (HCC) Jillian Green is taking metformin 1,000 mg BID. She stopped Jardiance in the last week due to frequent UTI and yeast infections. She has not tolerated GLP-1 due to GI upset. She has not tried oral GLP-1.  2. Other depression, with emotional eating behaviors Jillian Green feels Wellbutrin definitely helps with cravings. PCP refilled this recently. No side effects were noted.   Assessment/Plan:   1. Type 2 diabetes mellitus with other specified complication, without long-term current use of insulin ([place]) Ty agreed to start Rybelsus 3 mg once daily with no refills. We discussed how to take Rybelsus. She will continue with healthy eating plan. Glucose kit was sent to the pharmacy.   - glucose blood (ONETOUCH ULTRA) test strip; USE AS INSTRUCTED  Dispense: 100 strip; Refill: 0 - OneTouch Delica Lancets 84X MISC; 1 Device by Does not apply route daily.  Dispense: 100 each; Refill: 0 - metFORMIN (GLUCOPHAGE) 500 MG tablet; Take 2 tablets (1,000 mg total) by mouth 2 (two) times daily.  Dispense: 360 tablet; Refill: 1 -  Semaglutide (RYBELSUS) 3 MG TABS; Take 3 mg by mouth daily.  Dispense: 30 tablet; Refill: 0  2. Other depression, with emotional eating behaviors Jillian Green will continue with her healthy eating plan and exercise.   3. Obesity, Current  BMI 25.9 Jillian Green is currently in the action stage of change. As such, her goal is to continue with weight loss efforts. She has agreed to the Category 3 Plan.   Exercise goals: As is.   Behavioral modification strategies: increasing lean protein intake, decreasing simple carbohydrates, no skipping meals, and planning for success.  Jillian Green has agreed to follow-up with our clinic in 4 weeks. She was informed of the importance of frequent follow-up visits to maximize her success with intensive lifestyle modifications for her multiple health conditions.   Objective:   Blood pressure 112/69, pulse 100, temperature 98.3 F (36.8 C), height 5' 5"  (1.651 m), weight 155 lb (70.3 kg), last menstrual period 09/12/2018, SpO2 97 %. Body mass index is 25.79 kg/m.  General: Cooperative, alert, well developed, in no acute distress. HEENT: Conjunctivae and lids unremarkable. Cardiovascular: Regular rhythm.  Lungs: Normal work of breathing. Neurologic: No focal deficits.   Lab Results  Component Value Date   CREATININE 0.85 01/21/2022   BUN 14 01/21/2022   NA 139 01/21/2022   K 4.4 01/21/2022   CL 100 01/21/2022   CO2 22 01/21/2022   Lab Results  Component Value Date   ALT 22 01/21/2022   AST 18 01/21/2022   ALKPHOS 109 01/21/2022   BILITOT 0.6 01/21/2022   Lab Results  Component Value Date   HGBA1C 6.4 (H) 01/21/2022   HGBA1C 6.2 (H) 07/01/2021   HGBA1C 6.1 (H) 02/18/2021   HGBA1C 5.8 (H) 06/18/2020   HGBA1C 6.0 (H) 02/17/2020   Lab Results  Component Value Date   INSULIN 12.1 01/21/2022   INSULIN 10.1 07/01/2021   INSULIN 7.6 02/18/2021   INSULIN 14.0 06/18/2020   INSULIN 9.9 02/17/2020   Lab Results  Component Value Date   TSH 0.770 04/29/2018    Lab Results  Component Value Date   CHOL 211 (H) 01/21/2022   HDL 80 01/21/2022   LDLCALC 115 (H) 01/21/2022   TRIG 90 01/21/2022   CHOLHDL 2.8 02/18/2021   Lab Results  Component Value Date   VD25OH 38.6 01/21/2022   VD25OH 43.5 07/01/2021   VD25OH 41.1 02/18/2021   Lab Results  Component Value Date   WBC 6.8 04/29/2018   HGB 13.7 04/29/2018   HCT 42.3 04/29/2018   MCV 95 04/29/2018   PLT 305 10/23/2016   Lab Results  Component Value Date   IRON 156 (H) 09/23/2013   FERRITIN 103.5 09/23/2013   Attestation Statements:   Reviewed by clinician on day of visit: allergies, medications, problem list, medical history, surgical history, family history, social history, and previous encounter notes.   I, Trixie Dredge, am acting as transcriptionist for Dennard Nip, MD.  I have reviewed the above documentation for accuracy and completeness, and I agree with the above. -  Dennard Nip, MD

## 2022-05-29 ENCOUNTER — Encounter (INDEPENDENT_AMBULATORY_CARE_PROVIDER_SITE_OTHER): Payer: Self-pay | Admitting: Family Medicine

## 2022-05-29 NOTE — Telephone Encounter (Signed)
Please advise 

## 2022-05-30 ENCOUNTER — Ambulatory Visit (INDEPENDENT_AMBULATORY_CARE_PROVIDER_SITE_OTHER): Payer: BC Managed Care – PPO | Admitting: Psychology

## 2022-05-30 DIAGNOSIS — F4323 Adjustment disorder with mixed anxiety and depressed mood: Secondary | ICD-10-CM

## 2022-05-30 NOTE — Progress Notes (Signed)
[facility] Counselor/Therapist Progress Note  Patient ID: Jillian Green, MRN: [id number],    Date: 05/30/2022  Time Spent: 45 mins  Treatment Type: Individual Therapy  Reported Symptoms: Pt presents for session, via webex video.  Pt grants consent for the session, stating she is in her car with no one else present.  I shared with pt that I am in my office with no one else here either.  Mental Status Exam: Appearance:  Casual     Behavior: Appropriate  Motor: Normal  Speech/Language:  Clear and Coherent  Affect: Appropriate  Mood: normal  Thought process: normal  Thought content:   WNL  Sensory/Perceptual disturbances:   WNL  Orientation: oriented to person, place, and time/date  Attention: Good  Concentration: Good  Memory: WNL  Fund of knowledge:  Good  Insight:   Good  Judgment:  Good  Impulse Control: Good   Risk Assessment: Danger to Self:  No Self-injurious Behavior: No Danger to Others: No Duty to Warn:no Physical Aggression / Violence:No  Access to Firearms a concern: No  Gang Involvement:No   Subjective: Pt shares that "I am doing OK.  I met with Chris last weekend and he was a complete ass.  It is all about money for him.  He wanted to know what I was going to ask for and I told him I did not know yet."  Pt shares she has found a new attorney and she feels good about her.  Pt told him she did not want to talk anymore about money until 12/23 and she plans to get the rest of her stuff from the house and from their storage unit.  Pt shares that she likes her job and the people she works with; she feels like she is good at her job.  She shares she gets frustrated with "people above me that do not know what they are doing.  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  She is also planning to start  going to the gym soon.  She is going to do a favor for a friend and model the friend's clothes at an event in [place] on 11/9.  Pt shares that [name] is doing OK; her doctors have told her she needs anxiety meds so pt hopes she will use them to her benefit.  Her pregnancy is still going well; she finds out on 11/14 what the gender of the baby is.  She would like to have a daughter, but will love either one.  Pt would like for her to have a daughter as well.  [name] is able to feel the baby moving now and she enjoys that.  Baby is due in April, but they don't have a specific date yet.  Pt shares the construction people are still working in the building and have not finished the punch list yet.  Pt shares that Wyatt and his wife's anniversary was last week; pt shares that he has told pt that his wife has no idea that Wyatt is planning to leave.  He does not plan to tell her he is leaving until the day he leaves and he will give her the separation papers on that date (11/17).  His kids are grown.  His parents moved out of their [place] home   and will be giving it to him when his divorce is final.  Pt shares that there is a skunk around their home and she has to be careful when she walks the dog.  She is still sleeping about 6 hrs per night; encouraged pt to start her going to bed routine a bit earlier so she can get to sleep earlier.  Encouraged pt to continue with her self care activities and we will meet in 2 wks for a follow up session.  Interventions: Cognitive Behavioral Therapy  Diagnosis:Adjustment disorder with mixed anxiety and depressed mood  Plan: Treatment Plan Strengths/Abilities:  Intelligent, Intuitive, Willing to participate in therapy Treatment Preferences:  Outpatient Individual Therapy Statement of Needs:  Patient is to use CBT, mindfulness and coping skills to help manage and/or decrease symptoms associated with their diagnosis. Symptoms:  Depressed/Irritable mood, worry, social  withdrawal Problems Addressed:  Depressive thoughts, Sadness, Sleep issues, etc. Long Term Goals:  Pt to reduce overall level, frequency, and intensity of the feelings of depression/anxiety as evidenced by decreased irritability, negative self talk, and helpless feelings from 6 to 7 days/week to 0 to 1 days/week, per client report, for at least 3 consecutive months.  Progress: 20% Short Term Goals:  Pt to verbally express understanding of the relationship between feelings of depression/anxiety and their impact on thinking patterns and behaviors.  Pt to verbalize an understanding of the role that distorted thinking plays in creating fears, excessive worry, and ruminations.  Progress: 20% Target Date:  03/21/2023 Frequency:  Bi-weekly Modality:  Cognitive Behavioral Therapy Interventions by Therapist:  Therapist will use CBT, Mindfulness exercises, Coping skills and Referrals, as needed by client. Client has verbally approved this treatment plan.   B , LCMHC 

## 2022-06-13 ENCOUNTER — Ambulatory Visit (INDEPENDENT_AMBULATORY_CARE_PROVIDER_SITE_OTHER): Payer: BC Managed Care – PPO | Admitting: Psychology

## 2022-06-13 DIAGNOSIS — F4323 Adjustment disorder with mixed anxiety and depressed mood: Secondary | ICD-10-CM | POA: Diagnosis not present

## 2022-06-13 NOTE — Progress Notes (Signed)
[facility] Counselor/Therapist Progress Note  Patient ID: Jillian Green, MRN: [id number],    Date: 06/13/2022  Time Spent: 45 mins  Treatment Type: Individual Therapy  Reported Symptoms: Pt presents for session, via webex video.  Pt grants consent for the session, stating she is in her car with no one else present.  I shared with pt that I am in my office with no one else here either.  Mental Status Exam: Appearance:  Casual     Behavior: Appropriate  Motor: Normal  Speech/Language:  Clear and Coherent  Affect: Appropriate  Mood: normal  Thought process: normal  Thought content:   WNL  Sensory/Perceptual disturbances:   WNL  Orientation: oriented to person, place, and time/date  Attention: Good  Concentration: Good  Memory: WNL  Fund of knowledge:  Good  Insight:   Good  Judgment:  Good  Impulse Control: Good   Risk Assessment: Danger to Self:  No Self-injurious Behavior: No Danger to Others: No Duty to Warn:no Physical Aggression / Violence:No  Access to Firearms a concern: No  Gang Involvement:No   Subjective: Pt shares that "I am doing alright; I am hanging in there.  Work has been busy but I am struggling with how decisions are being made at work and the decisions do not seem to be made well.  I try to calm myself down when I get frustrated and sometimes I am not as successful with it as I would like to be."  Suggested to pt that she ask for permission from the person she is talking to to take a break and come back together to finish the discussion.  She will try this technique at the next time it comes up.  Pt shares this frustration has been growing for her at work.  Pt shares that [name] is still planning to leave on 11/17 (one week from today); he has indicated that he is afraid of how this decision will effect his wife.  Pt shares that she has been doing self care activities today on her day off from work; she got her hair done, got her eyelashes  done, had other things for herself as well.  She has enjoyed the day.  Pt shares she does have a good attorney and feels really good about how she can help pt through her divorce.  Pt has a birthday coming up next week; wished the pt a happy birthday.  Pt shares [name] is doing better with the pregnancy and seems to be in a better place.  Encouraged pt to continue with her self care activities and we will meet in 4 wks for a follow up session.  Interventions: Cognitive Behavioral Therapy  Diagnosis:Adjustment disorder with mixed anxiety and depressed mood  Plan: Treatment Plan Strengths/Abilities:  Intelligent, Intuitive, Willing to participate in therapy Treatment Preferences:  Outpatient Individual Therapy Statement of Needs:  Patient is to use CBT, mindfulness and coping skills to help manage and/or decrease symptoms associated with their diagnosis. Symptoms:  Depressed/Irritable mood, worry, social withdrawal Problems Addressed:  Depressive thoughts, Sadness, Sleep issues, etc. Long Term Goals:  Pt to reduce overall level, frequency, and intensity of the feelings of depression/anxiety as evidenced by decreased irritability, negative self talk, and helpless feelings from 6 to 7 days/week to 0 to 1 days/week, per client report, for at least 3 consecutive months.  Progress: 20% Short Term Goals:  Pt to verbally express understanding of the relationship between feelings of depression/anxiety and their impact on thinking  patterns and behaviors.  Pt to verbalize an understanding of the role that distorted thinking plays in creating fears, excessive worry, and ruminations.  Progress: 20% Target Date:  03/21/2023 Frequency:  Bi-weekly Modality:  Cognitive Behavioral Therapy Interventions by Therapist:  Therapist will use CBT, Mindfulness exercises, Coping skills and Referrals, as needed by client. Client has verbally approved this treatment plan.  Karie Kirks, [facility]

## 2022-06-14 ENCOUNTER — Encounter: Payer: Self-pay | Admitting: Family Medicine

## 2022-06-17 ENCOUNTER — Other Ambulatory Visit: Payer: Self-pay | Admitting: Family Medicine

## 2022-06-17 ENCOUNTER — Ambulatory Visit (INDEPENDENT_AMBULATORY_CARE_PROVIDER_SITE_OTHER): Payer: BC Managed Care – PPO | Admitting: Family Medicine

## 2022-06-17 VITALS — BP 109/73 | HR 91 | Temp 97.7°F | Ht 65.0 in | Wt 156.0 lb

## 2022-06-17 DIAGNOSIS — E1169 Type 2 diabetes mellitus with other specified complication: Secondary | ICD-10-CM

## 2022-06-17 DIAGNOSIS — F3289 Other specified depressive episodes: Secondary | ICD-10-CM | POA: Diagnosis not present

## 2022-06-17 DIAGNOSIS — F988 Other specified behavioral and emotional disorders with onset usually occurring in childhood and adolescence: Secondary | ICD-10-CM

## 2022-06-17 DIAGNOSIS — E669 Obesity, unspecified: Secondary | ICD-10-CM

## 2022-06-17 DIAGNOSIS — Z6826 Body mass index (BMI) 26.0-26.9, adult: Secondary | ICD-10-CM | POA: Diagnosis not present

## 2022-06-17 DIAGNOSIS — Z7984 Long term (current) use of oral hypoglycemic drugs: Secondary | ICD-10-CM

## 2022-06-17 DIAGNOSIS — Z6830 Body mass index (BMI) 30.0-30.9, adult: Secondary | ICD-10-CM

## 2022-06-17 MED ORDER — AMPHETAMINE-DEXTROAMPHET ER 20 MG PO CP24: 20.0000 mg | ORAL_CAPSULE | ORAL | 0 refills | Status: DC

## 2022-06-17 MED ORDER — TOPIRAMATE 50 MG PO TABS: 50.0000 mg | ORAL_TABLET | Freq: Every day | ORAL | 0 refills | Status: DC

## 2022-06-17 MED ORDER — METFORMIN HCL 500 MG PO TABS: 1000.0000 mg | ORAL_TABLET | Freq: Two times a day (BID) | ORAL | 1 refills | Status: DC

## 2022-06-20 ENCOUNTER — Other Ambulatory Visit (INDEPENDENT_AMBULATORY_CARE_PROVIDER_SITE_OTHER): Payer: Self-pay | Admitting: Physician Assistant

## 2022-06-20 DIAGNOSIS — E1169 Type 2 diabetes mellitus with other specified complication: Secondary | ICD-10-CM

## 2022-06-30 ENCOUNTER — Ambulatory Visit (INDEPENDENT_AMBULATORY_CARE_PROVIDER_SITE_OTHER): Payer: BC Managed Care – PPO | Admitting: Family Medicine

## 2022-06-30 NOTE — Progress Notes (Signed)
Chief Complaint:   OBESITY Jillian Green is here to discuss her progress with her obesity treatment plan along with follow-up of her obesity related diagnoses. Jillian Green is on the Category 3 Plan and states she is following her eating plan approximately 80% of the time. Jillian Green states she is walking for 30 minutes 3-4 times per week.  Today's visit was #: 39 Starting weight: 184 lbs Starting date: 04/29/2018 Today's weight: 156 lbs Today's date: 06/17/2022 Total lbs lost to date: 28 Total lbs lost since last in-office visit: 0  Interim History: Jillian Green has maintained her weight loss. She reports some increased stress lately. She did not tolerate her GLP-1 (Rybelsus) due to nausea and vomiting.   Subjective:   1. Type 2 diabetes mellitus with other specified complication, without long-term current use of insulin (HCC) Jillian Green is taking metformin 1,000 mg BID with no side effects noted. She stopped Rybelsus due to severe nausea and vomiting.   2. Other depression, with emotional eating behaviors Jillian Green is taking Wellbutrin 300 mg, and she still notes having cravings. She has no history of kidney stones and she is status post hysterectomy. We discussed starting Topamax, risks and benefits.   Assessment/Plan:   1. Type 2 diabetes mellitus with other specified complication, without long-term current use of insulin (HCC) Jillian Green will continue metformin, eating plan, and exercise. We will refill metformin for 90 days with 1 refill.   - metFORMIN (GLUCOPHAGE) 500 MG tablet; Take 2 tablets (1,000 mg total) by mouth 2 (two) times daily.  Dispense: 360 tablet; Refill: 1  2. Other depression, with emotional eating behaviors Jillian Green will continue Wellbutrin, and she agreed to start Topamax 50 mg qhs with no refills.   - topiramate (TOPAMAX) 50 MG tablet; Take 1 tablet (50 mg total) by mouth daily.  Dispense: 30 tablet; Refill: 0  3. Obesity, Current  BMI 26.1 Jillian Green is currently in the action stage of change.  As such, her goal is to continue with weight loss efforts. She has agreed to the Category 3 Plan.   Exercise goals: As is.   Behavioral modification strategies: increasing lean protein intake, decreasing simple carbohydrates, emotional eating strategies, and holiday eating strategies .  Jillian Green has agreed to follow-up with our clinic in 4 weeks. She was informed of the importance of frequent follow-up visits to maximize her success with intensive lifestyle modifications for her multiple health conditions.   Objective:   Blood pressure 109/73, pulse 91, temperature 97.7 F (36.5 C), height 5\' 5"  (1.651 m), weight 156 lb (70.8 kg), last menstrual period 09/12/2018, SpO2 98 %. Body mass index is 25.96 kg/m.  General: Cooperative, alert, well developed, in no acute distress. HEENT: Conjunctivae and lids unremarkable. Cardiovascular: Regular rhythm.  Lungs: Normal work of breathing. Neurologic: No focal deficits.   Lab Results  Component Value Date   CREATININE 0.85 01/21/2022   BUN 14 01/21/2022   NA 139 01/21/2022   K 4.4 01/21/2022   CL 100 01/21/2022   CO2 22 01/21/2022   Lab Results  Component Value Date   ALT 22 01/21/2022   AST 18 01/21/2022   ALKPHOS 109 01/21/2022   BILITOT 0.6 01/21/2022   Lab Results  Component Value Date   HGBA1C 6.4 (H) 01/21/2022   HGBA1C 6.2 (H) 07/01/2021   HGBA1C 6.1 (H) 02/18/2021   HGBA1C 5.8 (H) 06/18/2020   HGBA1C 6.0 (H) 02/17/2020   Lab Results  Component Value Date   INSULIN 12.1 01/21/2022   INSULIN 10.1  07/01/2021   INSULIN 7.6 02/18/2021   INSULIN 14.0 06/18/2020   INSULIN 9.9 02/17/2020   Lab Results  Component Value Date   TSH 0.770 04/29/2018   Lab Results  Component Value Date   CHOL 211 (H) 01/21/2022   HDL 80 01/21/2022   LDLCALC 115 (H) 01/21/2022   TRIG 90 01/21/2022   CHOLHDL 2.8 02/18/2021   Lab Results  Component Value Date   VD25OH 38.6 01/21/2022   VD25OH 43.5 07/01/2021   VD25OH 41.1 02/18/2021    Lab Results  Component Value Date   WBC 6.8 04/29/2018   HGB 13.7 04/29/2018   HCT 42.3 04/29/2018   MCV 95 04/29/2018   PLT 305 10/23/2016   Lab Results  Component Value Date   IRON 156 (H) 09/23/2013   FERRITIN 103.5 09/23/2013   Attestation Statements:   Reviewed by clinician on day of visit: allergies, medications, problem list, medical history, surgical history, family history, social history, and previous encounter notes.  I have personally spent 46 minutes total time today in preparation, patient care, and documentation for this visit, including the following: review of clinical lab tests; review of medical tests/procedures/services.   I, Burt Knack, am acting as transcriptionist for Quillian Quince, MD.  I have reviewed the above documentation for accuracy and completeness, and I agree with the above. -  Quillian Quince, MD

## 2022-07-06 ENCOUNTER — Encounter: Payer: Self-pay | Admitting: Family Medicine

## 2022-07-06 DIAGNOSIS — F988 Other specified behavioral and emotional disorders with onset usually occurring in childhood and adolescence: Secondary | ICD-10-CM

## 2022-07-07 ENCOUNTER — Other Ambulatory Visit: Payer: Self-pay | Admitting: Family Medicine

## 2022-07-07 MED ORDER — AMPHETAMINE-DEXTROAMPHET ER 30 MG PO CP24: 30.0000 mg | ORAL_CAPSULE | Freq: Every day | ORAL | 0 refills | Status: DC

## 2022-07-07 NOTE — Telephone Encounter (Signed)
Increased adderall to 30 and changed to xr

## 2022-07-10 ENCOUNTER — Other Ambulatory Visit (INDEPENDENT_AMBULATORY_CARE_PROVIDER_SITE_OTHER): Payer: Self-pay | Admitting: Family Medicine

## 2022-07-10 DIAGNOSIS — F3289 Other specified depressive episodes: Secondary | ICD-10-CM

## 2022-07-11 ENCOUNTER — Ambulatory Visit (INDEPENDENT_AMBULATORY_CARE_PROVIDER_SITE_OTHER): Payer: BC Managed Care – PPO | Admitting: Psychology

## 2022-07-11 DIAGNOSIS — F4323 Adjustment disorder with mixed anxiety and depressed mood: Secondary | ICD-10-CM

## 2022-07-11 NOTE — Progress Notes (Signed)
[facility] Counselor/Therapist Progress Note  Patient ID: Jillian Green, MRN: [id number],    Date: 07/11/2022  Time Spent: 45 mins  Treatment Type: Individual Therapy  Reported Symptoms: Pt presents for session, via webex video.  Pt grants consent for the session, stating she is in her car with no one else present.  I shared with pt that I am in my office with no one else here either.  Mental Status Exam: Appearance:  Casual     Behavior: Appropriate  Motor: Normal  Speech/Language:  Clear and Coherent  Affect: Appropriate  Mood: normal  Thought process: normal  Thought content:   WNL  Sensory/Perceptual disturbances:   WNL  Orientation: oriented to person, place, and time/date  Attention: Good  Concentration: Good  Memory: WNL  Fund of knowledge:  Good  Insight:   Good  Judgment:  Good  Impulse Control: Good   Risk Assessment: Danger to Self:  No Self-injurious Behavior: No Danger to Others: No Duty to Warn:no Physical Aggression / Violence:No  Access to Firearms a concern: No  Gang Involvement:No   Subjective: Pt shares that "I am doing OK. Thanksgiving was OK.  I went to meet [name] and we had a great time.  He told his wife he was leaving and his wife begged him to stay.  He has agreed to go to marriage counseling with his wife.  I told him that I was not going to hang around while he does that.  He did move out and has not moved back.  I am not sure how to move past it now."  Congratulated pt on setting limits with [name] to protect herself from further hurt.  Encouraged pt to continue to engage in her self care activities.  Pt shares that [name] is doing well with her pregnancy.  She is 25 weeks or so; due date is 4/12.  They are likely to be moving to [place], [place] in March for his job; he works in Consulting civil engineer.  They are in an apt in [place].  Pt shares her ex-husband emailed her a long message this week saying that he agreed that divorce is the best thing for  them.  She is planning to move her things out of the home; she is hoping to do that the week after next.  She has been working at school, keeping score/stats for basketball games, and working at [employer] on [employer].  Being busy has been good for her and she wants to keep doing it.  Pt is going to [place] to visit her family for Christmas; she is renting an Best boy B&B to allow her to spend some time on her own.  She is also planning to visit a friend in [place] over the upcoming Winter break.  Encouraged pt to continue with her self care activities and we will meet in 2 wks for a follow up session.  Interventions: Cognitive Behavioral Therapy  Diagnosis:Adjustment disorder with mixed anxiety and depressed mood  Plan: Treatment Plan Strengths/Abilities:  Intelligent, Intuitive, Willing to participate in therapy Treatment Preferences:  Outpatient Individual Therapy Statement of Needs:  Patient is to use CBT, mindfulness and coping skills to help manage and/or decrease symptoms associated with their diagnosis. Symptoms:  Depressed/Irritable mood, worry, social withdrawal Problems Addressed:  Depressive thoughts, Sadness, Sleep issues, etc. Long Term Goals:  Pt to reduce overall level, frequency, and intensity of the feelings of depression/anxiety as evidenced by decreased irritability, negative self talk, and helpless feelings from  6 to 7 days/week to 0 to 1 days/week, per client report, for at least 3 consecutive months.  Progress: 20% Short Term Goals:  Pt to verbally express understanding of the relationship between feelings of depression/anxiety and their impact on thinking patterns and behaviors.  Pt to verbalize an understanding of the role that distorted thinking plays in creating fears, excessive worry, and ruminations.  Progress: 20% Target Date:  03/21/2023 Frequency:  Bi-weekly Modality:  Cognitive Behavioral Therapy Interventions by Therapist:  Therapist will use CBT, Mindfulness  exercises, Coping skills and Referrals, as needed by client. Client has verbally approved this treatment plan.  Karie Kirks, [facility]

## 2022-07-14 ENCOUNTER — Ambulatory Visit
Admission: RE | Admit: 2022-07-14 | Discharge: 2022-07-14 | Disposition: A | Discharge status: Home / Self Care | Payer: BC Managed Care – PPO | Source: Ambulatory Visit | Attending: Family Medicine | Admitting: Family Medicine

## 2022-07-14 ENCOUNTER — Telehealth: Payer: BC Managed Care – PPO

## 2022-07-14 VITALS — BP 113/76 | HR 94 | Temp 98.8°F | Resp 20 | Ht 65.0 in | Wt 155.0 lb

## 2022-07-14 DIAGNOSIS — R52 Pain, unspecified: Secondary | ICD-10-CM

## 2022-07-14 DIAGNOSIS — R059 Cough, unspecified: Secondary | ICD-10-CM | POA: Diagnosis not present

## 2022-07-14 LAB — RESP PANEL BY RT-PCR (FLU A&B, COVID) ARPGX2
Influenza A by PCR: POSITIVE — AB
Influenza B by PCR: POSITIVE — AB
SARS Coronavirus 2 by RT PCR: NEGATIVE

## 2022-07-14 MED ORDER — AZITHROMYCIN 250 MG PO TABS: 250.0000 mg | ORAL_TABLET | Freq: Every day | ORAL | 0 refills | Status: DC

## 2022-07-14 MED ORDER — BENZONATATE 200 MG PO CAPS: 200.0000 mg | ORAL_CAPSULE | Freq: Three times a day (TID) | ORAL | 0 refills | Status: AC | PRN

## 2022-07-14 NOTE — ED Triage Notes (Addendum)
Pt presents to Urgent Care with c/o cough, body aches, fatigue, and fever x 2.5 days. No COVID test done. Pt reports possible syncopal episode w/ fall 2 nights ago and also states she fell asleep late on 07/12/22 and just woke up this morning.

## 2022-07-14 NOTE — Discharge Instructions (Addendum)
Advised patient to take medication as directed with food to completion.  Advised may take Tessalon daily or as needed for cough advised patient may take OTC Tylenol 1000 mg every 6 hours for fever or pain.  Encouraged patient to increase daily water intake to 64 ounces per day while taking this medication.  Advised we will follow-up with COVID-19/influenza results once received.

## 2022-07-14 NOTE — ED Provider Notes (Signed)
[facility]    CSN: [id number] Arrival date & time: 07/14/22  1448      History   Chief Complaint Chief Complaint  Patient presents with   Generalized Body Aches   Cough   Fever    HPI Jillian Green is a 51 y.o. female.   HPI Pleasant 51-year-old female presents with generalized body aches, cough, fatigue and fever for 2.5 days.  PMH significant for chronic headaches, fibromyalgia, and ADD.  Past Medical History:  Diagnosis Date   ADD (attention deficit disorder)    Anxiety    Back pain    Chronic headaches    Depression    Fibromyalgia    GERD (gastroesophageal reflux disease)    Itchy skin    Joint pain    Lactose intolerance    Migraine    Sweating increase    Type 2 diabetes mellitus without complication, without long-term current use of insulin (HCC) 09/13/2018   Unspecified eustachian tube disorder     Patient Active Problem List   Diagnosis Date Noted   Class 1 obesity with serious comorbidity and body mass index (BMI) of 30.0 to 30.9 in adult 05/20/2022   Diabetes mellitus (HCC) 09/13/2018   Other hyperlipidemia 09/13/2018   Other fatigue 04/29/2018   Shortness of breath on exertion 04/29/2018   Nonintractable migraine, unspecified migraine type 02/14/2018   Seasonal allergies 02/14/2018   Right foot pain 12/07/2017   Fibromyalgia 07/26/2015   Right foot sprain 10/10/2014   Acute pharyngitis 09/04/2014   ADD (attention deficit disorder) 09/04/2014   CTS (carpal tunnel syndrome) 09/23/2013   Dizzy 09/23/2013   Headache(784.0) 09/23/2013   Left tennis elbow 10/19/2012   Depression with anxiety 01/07/2010   HIP PAIN, LEFT 03/02/2009   SINUSITIS- ACUTE-NOS 08/24/2008   RHINITIS 07/12/2008   Attention deficit disorder 05/22/2008   Migraine without aura 03/26/2007   DISORDER, EUSTACHIAN TUBE NOS 03/26/2007    Past Surgical History:  Procedure Laterality Date   CESAREAN SECTION     OTHER SURGICAL HISTORY     preforated uterine  surgery   SINUS EXPLORATION      OB History     Gravida  2   Para  1   Term      Preterm      AB      Living         SAB      IAB      Ectopic      Multiple      Live Births               Home Medications    Prior to Admission medications   Medication Sig Start Date End Date Taking? Authorizing Provider  amphetamine-dextroamphetamine (ADDERALL XR) 30 MG 24 hr capsule Take 1 capsule (30 mg total) by mouth daily. 07/07/22   Lowne Chase, Yvonne R, DO  azithromycin (ZITHROMAX) 250 MG tablet Take 1 tablet (250 mg total) by mouth daily. Take first 2 tablets together, then 1 every day until finished. 07/14/22  Yes , , FNP  benzonatate (TESSALON) 200 MG capsule Take 1 capsule (200 mg total) by mouth 3 (three) times daily as needed for up to 7 days. 07/14/22 07/21/22 Yes , , FNP  ALPRAZolam (XANAX) 0.5 MG tablet Take 1 tablet (0.5 mg total) by mouth 3 (three) times daily as needed for anxiety. 05/23/22   Lowne Chase, Yvonne R, DO  amphetamine-dextroamphetamine (ADDERALL XR) 20 MG 24 hr capsule Take 1   capsule (20 mg total) by mouth every morning. 06/17/22   Ann Held, DO  atorvastatin (LIPITOR) 10 MG tablet Take 1 tablet (10 mg total) by mouth daily. 11/18/21   Dennard Nip D, MD  azelastine (OPTIVAR) 0.05 % ophthalmic solution Place 1 drop into both eyes 2 (two) times daily as needed. 02/23/20   Ann Held, DO  BD PEN NEEDLE NANO U/F 32G X 4 MM MISC USE TWICE A DAY 12/20/18   Abby Potash, PA-C  blood glucose meter kit and supplies Dispense based on patient and insurance preference. Use up to four times daily as directed. (FOR ICD-10 E10.9, E11.9). 08/23/19   Abby Potash, PA-C  buPROPion (WELLBUTRIN XL) 300 MG 24 hr tablet Take 1 tablet (300 mg total) by mouth daily. 04/18/22   Roma Schanz R, DO  DULoxetine (CYMBALTA) 60 MG capsule TAKE 1 CAPSULE BY MOUTH TWICE A DAY 05/19/22   Roma Schanz R, DO   ergocalciferol (VITAMIN D2) 1.25 MG (50000 UT) capsule Take by mouth. 08/31/18   [provider]  fluconazole (DIFLUCAN) 150 MG tablet 1 po x1, may repeat in 3 days prn 05/23/22   Carollee Herter, Alferd Apa, DO  fluticasone (FLONASE) 50 MCG/ACT nasal spray Place 2 sprays into both nostrils daily. 05/23/22   Roma Schanz R, DO  glucose blood (ONETOUCH ULTRA) test strip USE AS INSTRUCTED 05/20/22   Rayburn, Neta Mends, PA-C  hydrOXYzine (ATARAX) 25 MG tablet TAKE 1 TO 2 TABLETS BY MOUTH AT BEDTIME AS NEEDED FOR SLEEP 11/25/21   Carollee Herter, Alferd Apa, DO  levocetirizine (XYZAL) 5 MG tablet Take 1 tablet (5 mg total) by mouth every evening. 05/23/22   Ann Held, DO  lisdexamfetamine (VYVANSE) 60 MG capsule Take 1 capsule (60 mg total) by mouth every morning. 05/23/22   Ann Held, DO  lisdexamfetamine (VYVANSE) 60 MG capsule Take 1 capsule (60 mg total) by mouth every morning. 05/23/22   Ann Held, DO  meloxicam (MOBIC) 15 MG tablet TAKE 1/2 TO 1 TABLET BY MOUTH DAILY AS NEEDED 07/30/21   Carollee Herter, Alferd Apa, DO  metFORMIN (GLUCOPHAGE) 500 MG tablet Take 2 tablets (1,000 mg total) by mouth 2 (two) times daily. 06/17/22   Starlyn Skeans, MD  OneTouch Delica Lancets 28B MISC 1 Device by Does not apply route daily. 05/20/22   Rayburn, Neta Mends, PA-C  promethazine-dextromethorphan (PROMETHAZINE-DM) 6.25-15 MG/5ML syrup Take 5 mLs by mouth 4 (four) times daily as needed. 05/23/22   Ann Held, DO  rizatriptan (MAXALT-MLT) 10 MG disintegrating tablet Take 1 tablet (10 mg total) by mouth as needed for migraine. May repeat in 2 hours if needed 03/20/20   Carollee Herter, Alferd Apa, DO  Semaglutide (RYBELSUS) 3 MG TABS Take 3 mg by mouth daily. 05/20/22   Rayburn, Neta Mends, PA-C  topiramate (TOPAMAX) 50 MG tablet Take 1 tablet (50 mg total) by mouth daily. 06/17/22   Starlyn Skeans, MD    Family History Family History  Problem  Relation Age of Onset   Other Father        Dyslexia   Heart disease Father    Hypertension Father    Alcoholism Father    Hypertension Mother    Thyroid disease Mother    Anxiety disorder Mother    Sleep apnea Mother    Obesity Mother    ADD / ADHD Sister     Social History Social History  Tobacco Use   Smoking status: Former    Years: 8.00    Types: Cigarettes    Quit date: 05/15/2006    Years since quitting: 16.1   Smokeless tobacco: Never   Tobacco comments:    1 cig every 4 months  Vaping Use   Vaping Use: Never used  Substance Use Topics   Alcohol use: Not Currently    Comment: occasionally   Drug use: No     Allergies   Augmentin [amoxicillin-pot clavulanate]   Review of Systems Review of Systems  Constitutional:  Positive for chills, fatigue and fever.  Musculoskeletal:  Positive for myalgias.  All other systems reviewed and are negative.    Physical Exam Triage Vital Signs ED Triage Vitals  Enc Vitals Group     BP --      Pulse --      Resp --      Temp --      Temp src --      SpO2 --      Weight 07/14/22 1503 155 lb (70.3 kg)     Height 07/14/22 1503 5' 5" (1.651 m)     Head Circumference --      Peak Flow --      Pain Score 07/14/22 1502 3     Pain Loc --      Pain Edu? --      Excl. in GC? --    No data found.  Updated Vital Signs BP 113/76 (BP Location: Right Arm)   Pulse 94   Temp 98.8 F (37.1 C) (Oral)   Resp 20   Ht 5' 5" (1.651 m)   Wt 155 lb (70.3 kg)   LMP 09/12/2018   SpO2 97%   BMI 25.79 kg/m       Physical Exam Vitals and nursing note reviewed.  Constitutional:      Appearance: Normal appearance. She is obese. She is ill-appearing.  HENT:     Head: Normocephalic and atraumatic.     Right Ear: Tympanic membrane, ear canal and external ear normal.     Left Ear: Tympanic membrane, ear canal and external ear normal.     Nose: Nose normal.     Mouth/Throat:     Mouth: Mucous membranes are dry.      Pharynx: Oropharynx is clear.  Eyes:     Extraocular Movements: Extraocular movements intact.     Conjunctiva/sclera: Conjunctivae normal.     Pupils: Pupils are equal, round, and reactive to light.  Cardiovascular:     Rate and Rhythm: Normal rate and regular rhythm.     Pulses: Normal pulses.     Heart sounds: Normal heart sounds. No murmur heard.    No friction rub. No gallop.  Pulmonary:     Effort: Pulmonary effort is normal.     Breath sounds: Normal breath sounds. No wheezing, rhonchi or rales.     Comments: Infrequent nonproductive cough noted on exam Musculoskeletal:        General: Normal range of motion.     Cervical back: Normal range of motion and neck supple. No tenderness.  Lymphadenopathy:     Cervical: No cervical adenopathy.  Skin:    General: Skin is warm and dry.  Neurological:     General: No focal deficit present.     Mental Status: She is alert and oriented to person, place, and time.      UC Treatments / Results  Labs (all labs ordered   are listed, but only abnormal results are displayed) Labs Reviewed  RESP PANEL BY RT-PCR (FLU A&B, COVID) [id number]    EKG   Radiology No results found.  Procedures Procedures (including critical care time)  Medications Ordered in UC Medications - No data to display  Initial Impression / Assessment and Plan / UC Course  I have reviewed the triage vital signs and the nursing notes.  Pertinent labs & imaging results that were available during my care of the patient were reviewed by me and considered in my medical decision making (see chart for details).     MDM: 1.  Generalized body aches-lab 10093 ordered; 2.  Cough-Rx'd Zithromax, Tessalon. Advised patient to take medication as directed with food to completion.  Advised may take Tessalon daily or as needed for cough advised patient may take OTC Tylenol 1000 mg every 6 hours for fever or pain.  Encouraged patient to increase daily water intake to 64 ounces per  day while taking this medication.  Advised we will follow-up with COVID-19/influenza results once received.  Work note provided prior to discharge.  Patient discharged home, hemodynamically stable. Final Clinical Impressions(s) / UC Diagnoses   Final diagnoses:  Generalized body aches  Cough, unspecified type     Discharge Instructions      Advised patient to take medication as directed with food to completion.  Advised may take Tessalon daily or as needed for cough advised patient may take OTC Tylenol 1000 mg every 6 hours for fever or pain.  Encouraged patient to increase daily water intake to 64 ounces per day while taking this medication.  Advised we will follow-up with COVID-19/influenza results once received.     ED Prescriptions     Medication Sig Dispense Auth. Provider   azithromycin (ZITHROMAX) 250 MG tablet Take 1 tablet (250 mg total) by mouth daily. Take first 2 tablets together, then 1 every day until finished. 6 tablet Eliezer Lofts, FNP   benzonatate (TESSALON) 200 MG capsule Take 1 capsule (200 mg total) by mouth 3 (three) times daily as needed for up to 7 days. 40 capsule Eliezer Lofts, FNP      PDMP not reviewed this encounter.   Eliezer Lofts, [place] 07/14/22 1547

## 2022-07-15 ENCOUNTER — Telehealth: Payer: Self-pay

## 2022-07-15 MED ORDER — OSELTAMIVIR PHOSPHATE 75 MG PO CAPS: 75.0000 mg | ORAL_CAPSULE | Freq: Two times a day (BID) | ORAL | 0 refills | Status: DC

## 2022-07-15 NOTE — Telephone Encounter (Signed)
[facility] if work note needed. FLU lab results given. Tamiflu sent to pharmacy. Call if any questions or concerns.

## 2022-07-15 NOTE — Telephone Encounter (Signed)
Advised of influenza A/B+ results.  Tamiflu sent to patient's pharmacy.  Patient provided 5-day work excuse note for resolution of symptoms.

## 2022-07-21 ENCOUNTER — Ambulatory Visit (INDEPENDENT_AMBULATORY_CARE_PROVIDER_SITE_OTHER): Payer: BC Managed Care – PPO | Admitting: Family Medicine

## 2022-07-21 ENCOUNTER — Encounter (INDEPENDENT_AMBULATORY_CARE_PROVIDER_SITE_OTHER): Payer: Self-pay | Admitting: Family Medicine

## 2022-07-21 VITALS — BP 129/87 | HR 93 | Temp 98.1°F | Ht 65.0 in | Wt 148.0 lb

## 2022-07-21 DIAGNOSIS — Z6824 Body mass index (BMI) 24.0-24.9, adult: Secondary | ICD-10-CM

## 2022-07-21 DIAGNOSIS — E669 Obesity, unspecified: Secondary | ICD-10-CM | POA: Diagnosis not present

## 2022-07-21 DIAGNOSIS — E1169 Type 2 diabetes mellitus with other specified complication: Secondary | ICD-10-CM | POA: Diagnosis not present

## 2022-07-21 DIAGNOSIS — F3289 Other specified depressive episodes: Secondary | ICD-10-CM | POA: Diagnosis not present

## 2022-07-21 DIAGNOSIS — Z7984 Long term (current) use of oral hypoglycemic drugs: Secondary | ICD-10-CM

## 2022-07-21 MED ORDER — TOPIRAMATE 50 MG PO TABS: 50.0000 mg | ORAL_TABLET | Freq: Every day | ORAL | 0 refills | Status: DC

## 2022-07-21 MED ORDER — METFORMIN HCL 500 MG PO TABS: 1000.0000 mg | ORAL_TABLET | Freq: Two times a day (BID) | ORAL | 1 refills | Status: DC

## 2022-07-25 ENCOUNTER — Ambulatory Visit (INDEPENDENT_AMBULATORY_CARE_PROVIDER_SITE_OTHER): Payer: BC Managed Care – PPO | Admitting: Psychology

## 2022-07-25 DIAGNOSIS — F4323 Adjustment disorder with mixed anxiety and depressed mood: Secondary | ICD-10-CM

## 2022-07-25 NOTE — Progress Notes (Signed)
[facility] Counselor/Therapist Progress Note  Patient ID: Jillian Green, MRN: [id number],    Date: 07/25/2022  Time Spent: 45 mins  Treatment Type: Individual Therapy  Reported Symptoms: Pt presents for session, via webex video.  Pt grants consent for the session, stating she is in her home with no one else present.  I shared with pt that I am in my office with no one else here either.  Mental Status Exam: Appearance:  Casual     Behavior: Appropriate  Motor: Normal  Speech/Language:  Clear and Coherent  Affect: Appropriate  Mood: normal  Thought process: normal  Thought content:   WNL  Sensory/Perceptual disturbances:   WNL  Orientation: oriented to person, place, and time/date  Attention: Good  Concentration: Good  Memory: WNL  Fund of knowledge:  Good  Insight:   Good  Judgment:  Good  Impulse Control: Good   Risk Assessment: Danger to Self:  No Self-injurious Behavior: No Danger to Others: No Duty to Warn:no Physical Aggression / Violence:No  Access to Firearms a concern: No  Gang Involvement:No   Subjective: Pt shares that "I just had an emotional conversation with [name] earlier today.  It turns out that we are both going to be in [place] this weekend for Christmas."  Pt shares that they had started talking again as friends and that was going OK; it got more and more emotionally involved and he told her today that his wife is going with him to [place] so pt will not be able to see him this weekend.  Pt shares again that she does not want to keep coming in second.  She shares again that she and [name] "share such a history and we have such a bond that I have never had before."  I never felt this way with Thayer Ohm.  Pt shares that she thinks [name] is staying with his wife out of guilt.  Pt shares she believes [name] loves pt and he just feels guilty about that.  [name] has told her that his wife has stopped drinking and is going to counseling twice a week  now and they are seeing a couples therapist.  Pt shares that she will be with her sister in [place] and her families and her parents.  Pt shares she knows that she needs to be completely separated from [facility] from now on; "I know I don't need to have any contact with him from now on."  Encouraged pt to be very intentional about spending time with and surround herself with people who are supportive of her so she can invest her emotional energy in them and in herself as well.  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  Pt shares she had the flu last week but is feeling better this week.  Pt shares that [place] and her boyfriend are in [place], [place] for Christmas with his family.  He might be able to relocate there for his job which would be a good opportunity for him.  The baby is developing well, per the doctors.  She is due 11/14/22.  Pt shares that she and Thayer Ohm have been talking about separation of property.  He told her that he does love her and he wanted to talk with her about reconciling.  Encouraged pt to continue talking with Thayer Ohm until she has clear direction about her next steps.  Encouraged pt to continue  with her self care activities and we will meet in 2 wks for a follow up session.  Interventions: Cognitive Behavioral Therapy  Diagnosis:Adjustment disorder with mixed anxiety and depressed mood  Plan: Treatment Plan Strengths/Abilities:  Intelligent, Intuitive, Willing to participate in therapy Treatment Preferences:  Outpatient Individual Therapy Statement of Needs:  Patient is to use CBT, mindfulness and coping skills to help manage and/or decrease symptoms associated with their diagnosis. Symptoms:  Depressed/Irritable mood, worry, social withdrawal Problems Addressed:  Depressive thoughts, Sadness, Sleep issues, etc. Long Term Goals:  Pt to reduce overall level, frequency, and intensity of the feelings of  depression/anxiety as evidenced by decreased irritability, negative self talk, and helpless feelings from 6 to 7 days/week to 0 to 1 days/week, per client report, for at least 3 consecutive months.  Progress: 20% Short Term Goals:  Pt to verbally express understanding of the relationship between feelings of depression/anxiety and their impact on thinking patterns and behaviors.  Pt to verbalize an understanding of the role that distorted thinking plays in creating fears, excessive worry, and ruminations.  Progress: 20% Target Date:  03/21/2023 Frequency:  Bi-weekly Modality:  Cognitive Behavioral Therapy Interventions by Therapist:  Therapist will use CBT, Mindfulness exercises, Coping skills and Referrals, as needed by client. Client has verbally approved this treatment plan.  Karie Kirks, [facility]

## 2022-07-31 NOTE — Progress Notes (Signed)
Chief Complaint:   OBESITY Jillian Green is here to discuss her progress with her obesity treatment plan along with follow-up of her obesity related diagnoses. Jillian Green is on the Category 3 Plan and states she is following her eating plan approximately 75% of the time. Jillian Green states she is doing 0 minutes 0 times per week.  Today's visit was #: 40 Starting weight: 184 lbs Starting date: 04/29/2018 Today's weight: 148 lbs Today's date: 07/21/2022 Total lbs lost to date: 36 Total lbs lost since last in-office visit: 8  Interim History: Jillian Green has done well with her weight loss, partially due to having the flu but mostly because of her diet.  She is at her BMI and visceral fat goal, but her muscle mass has drifted down due to lack of strengthening exercise with her weight loss.  Subjective:   1. Type 2 diabetes mellitus with other specified complication, without long-term current use of insulin (HCC) [place] is unable to tolerate GLP-1, and she is stable on metformin with no side effects noted.  She is doing well with her diet.  2. Other depression, with emotional eating behaviors Jillian [place] is doing well on Topamax, which she feels is still helping to decrease emotional eating behaviors.  No side effects were noted.  Assessment/Plan:   1. Type 2 diabetes mellitus with other specified complication, without long-term current use of insulin (HCC) Jillian Green will continue metformin 500 mg, and we will refill for 90 days.  - metFORMIN (GLUCOPHAGE) 500 MG tablet; Take 2 tablets (1,000 mg total) by mouth 2 (two) times daily.  Dispense: 360 tablet; Refill: 1  2. Other depression, with emotional eating behaviors Jillian Green will continue Topamax 50 mg once daily, and we will refill for 1 month.  - topiramate (TOPAMAX) 50 MG tablet; Take 1 tablet (50 mg total) by mouth daily.  Dispense: 30 tablet; Refill: 0  3. Obesity, Current  BMI 24.7 Jillian Green is currently in the action stage of change. As such, her goal is to  continue with weight loss efforts. She has agreed to the Category 3 Plan or keeping a food journal and adhering to recommended goals of 1500 calories and 100+ grams of protein daily.   Exercise goals: Add strengthening exercise for 20 minutes 3-5 times per week.   Behavioral modification strategies: increasing lean protein intake and holiday eating strategies .  Jillian Green has agreed to follow-up with our clinic in 4 weeks. She was informed of the importance of frequent follow-up visits to maximize her success with intensive lifestyle modifications for her multiple health conditions.   Objective:   Blood pressure 129/87, pulse 93, temperature 98.1 F (36.7 C), height 5\' 5"  (1.651 m), weight 148 lb (67.1 kg), last menstrual period 09/12/2018, SpO2 100 %. Body mass index is 24.63 kg/m.  General: Cooperative, alert, well developed, in no acute distress. HEENT: Conjunctivae and lids unremarkable. Cardiovascular: Regular rhythm.  Lungs: Normal work of breathing. Neurologic: No focal deficits.   Lab Results  Component Value Date   CREATININE 0.85 01/21/2022   BUN 14 01/21/2022   NA 139 01/21/2022   K 4.4 01/21/2022   CL 100 01/21/2022   CO2 22 01/21/2022   Lab Results  Component Value Date   ALT 22 01/21/2022   AST 18 01/21/2022   ALKPHOS 109 01/21/2022   BILITOT 0.6 01/21/2022   Lab Results  Component Value Date   HGBA1C 6.4 (H) 01/21/2022   HGBA1C 6.2 (H) 07/01/2021   HGBA1C 6.1 (H) 02/18/2021  HGBA1C 5.8 (H) 06/18/2020   HGBA1C 6.0 (H) 02/17/2020   Lab Results  Component Value Date   INSULIN 12.1 01/21/2022   INSULIN 10.1 07/01/2021   INSULIN 7.6 02/18/2021   INSULIN 14.0 06/18/2020   INSULIN 9.9 02/17/2020   Lab Results  Component Value Date   TSH 0.770 04/29/2018   Lab Results  Component Value Date   CHOL 211 (H) 01/21/2022   HDL 80 01/21/2022   LDLCALC 115 (H) 01/21/2022   TRIG 90 01/21/2022   CHOLHDL 2.8 02/18/2021   Lab Results  Component Value Date    VD25OH 38.6 01/21/2022   VD25OH 43.5 07/01/2021   VD25OH 41.1 02/18/2021   Lab Results  Component Value Date   WBC 6.8 04/29/2018   HGB 13.7 04/29/2018   HCT 42.3 04/29/2018   MCV 95 04/29/2018   PLT 305 10/23/2016   Lab Results  Component Value Date   IRON 156 (H) 09/23/2013   FERRITIN 103.5 09/23/2013   Attestation Statements:   Reviewed by clinician on day of visit: allergies, medications, problem list, medical history, surgical history, family history, social history, and previous encounter notes.   I, Trixie Dredge, am acting as transcriptionist for Dennard Nip, MD.  I have reviewed the above documentation for accuracy and completeness, and I agree with the above. -  Dennard Nip, MD

## 2022-08-06 ENCOUNTER — Ambulatory Visit (INDEPENDENT_AMBULATORY_CARE_PROVIDER_SITE_OTHER): Payer: BC Managed Care – PPO | Admitting: Psychology

## 2022-08-06 DIAGNOSIS — F4323 Adjustment disorder with mixed anxiety and depressed mood: Secondary | ICD-10-CM | POA: Diagnosis not present

## 2022-08-06 NOTE — Progress Notes (Signed)
[facility] Counselor/Therapist Progress Note  Patient ID: Jillian Green, MRN: [id number],    Date: 08/06/2022  Time Spent: 45 mins  Treatment Type: Individual Therapy  Reported Symptoms: Pt presents for session, via webex video.  Pt grants consent for the session, stating she is in her car with no one else present.  I shared with pt that I am in my office with no one else here either.  Mental Status Exam: Appearance:  Casual     Behavior: Appropriate  Motor: Normal  Speech/Language:  Clear and Coherent  Affect: Appropriate  Mood: normal  Thought process: normal  Thought content:   WNL  Sensory/Perceptual disturbances:   WNL  Orientation: oriented to person, place, and time/date  Attention: Good  Concentration: Good  Memory: WNL  Fund of knowledge:  Good  Insight:   Good  Judgment:  Good  Impulse Control: Good   Risk Assessment: Danger to Self:  No Self-injurious Behavior: No Danger to Others: No Duty to Warn:no Physical Aggression / Violence:No  Access to Firearms a concern: No  Gang Involvement:No   Subjective: Pt shares that "I had a good holiday break; I went to [place] to my sister's and it was a good time.  Staying in the [facility] was the best decision I made."  Pt returned to work yesterday and the kids came back today.  Pt shares that she and Hulen Skains "had it out.  I am really hurt and heartbroken about it.  I told him to be aware that all of their electronic communication can be subpoenaed, per my attorney.  We talked about what he wants to do.  He finally told me that he loved his wife and he wants to stay with her because they have kids and they want grand kids too.  I told him I need to be finished with you and with this and I cut off all communication with him as of last Thursday."  Pt shares that she and Gerald Stabs went through their storage unit but he was not very helpful.  Encouraged pt to lean into her friendships during this period of time.  She  saw [name] on Saturday; they are looking at homes in [place], [place] and [place] asked pt to move with them to [facility]; pt may consider doing it; encouraged pt to do some preliminary research on re-location.  The baby is continuing to develop well; still due on 11/14/22.  Pt shares that her job is going alright so far.  Encouraged pt to be very intentional about spending time with and surround herself with people who are supportive of her so she can invest her emotional energy in them and in herself as well.  Encouraged pt to continue with her self care activities and we will meet in 2 wks for a follow up session.  Interventions: Cognitive Behavioral Therapy  Diagnosis:Adjustment disorder with mixed anxiety and depressed mood  Plan: Treatment Plan Strengths/Abilities:  Intelligent, Intuitive, Willing to participate in therapy Treatment Preferences:  Outpatient Individual Therapy Statement of Needs:  Patient is to use CBT, mindfulness and coping skills to help manage and/or decrease symptoms associated with their diagnosis. Symptoms:  Depressed/Irritable mood, worry, social withdrawal Problems Addressed:  Depressive thoughts, Sadness, Sleep issues, etc. Long Term Goals:  Pt to reduce overall level, frequency, and intensity of the feelings of depression/anxiety as evidenced by decreased irritability, negative self talk, and helpless feelings from 6 to 7 days/week to 0 to 1 days/week, per client report, for  at least 3 consecutive months.  Progress: 20% Short Term Goals:  Pt to verbally express understanding of the relationship between feelings of depression/anxiety and their impact on thinking patterns and behaviors.  Pt to verbalize an understanding of the role that distorted thinking plays in creating fears, excessive worry, and ruminations.  Progress: 20% Target Date:  03/21/2023 Frequency:  Bi-weekly Modality:  Cognitive Behavioral Therapy Interventions by Therapist:  Therapist will use CBT,  Mindfulness exercises, Coping skills and Referrals, as needed by client. Client has verbally approved this treatment plan.  Ivan Anchors, [facility]

## 2022-08-07 ENCOUNTER — Other Ambulatory Visit: Payer: Self-pay | Admitting: Family Medicine

## 2022-08-07 ENCOUNTER — Encounter: Payer: Self-pay | Admitting: Family Medicine

## 2022-08-07 DIAGNOSIS — F988 Other specified behavioral and emotional disorders with onset usually occurring in childhood and adolescence: Secondary | ICD-10-CM

## 2022-08-07 MED ORDER — AMPHETAMINE-DEXTROAMPHET ER 30 MG PO CP24: 30.0000 mg | ORAL_CAPSULE | Freq: Every day | ORAL | 0 refills | Status: DC

## 2022-08-08 ENCOUNTER — Other Ambulatory Visit: Payer: Self-pay | Admitting: Family Medicine

## 2022-08-08 DIAGNOSIS — F988 Other specified behavioral and emotional disorders with onset usually occurring in childhood and adolescence: Secondary | ICD-10-CM

## 2022-08-08 MED ORDER — AMPHETAMINE-DEXTROAMPHET ER 20 MG PO CP24: ORAL_CAPSULE | ORAL | 0 refills | Status: DC

## 2022-08-22 ENCOUNTER — Ambulatory Visit (INDEPENDENT_AMBULATORY_CARE_PROVIDER_SITE_OTHER): Payer: BC Managed Care – PPO | Admitting: Psychology

## 2022-08-22 DIAGNOSIS — F4323 Adjustment disorder with mixed anxiety and depressed mood: Secondary | ICD-10-CM | POA: Diagnosis not present

## 2022-08-22 NOTE — Progress Notes (Signed)
[facility] Counselor/Therapist Progress Note  Patient ID: Jillian Green, MRN: [id number],    Date: 08/22/2022  Time Spent: 45 mins  Treatment Type: Individual Therapy  Reported Symptoms: Pt presents for session, via webex video.  Pt grants consent for the session, stating she is in her office with no one else present.  I shared with pt that I am in my office with no one else here either.  Mental Status Exam: Appearance:  Casual     Behavior: Appropriate  Motor: Normal  Speech/Language:  Clear and Coherent  Affect: Appropriate  Mood: normal  Thought process: normal  Thought content:   WNL  Sensory/Perceptual disturbances:   WNL  Orientation: oriented to person, place, and time/date  Attention: Good  Concentration: Good  Memory: WNL  Fund of knowledge:  Good  Insight:   Good  Judgment:  Good  Impulse Control: Good   Risk Assessment: Danger to Self:  No Self-injurious Behavior: No Danger to Others: No Duty to Warn:no Physical Aggression / Violence:No  Access to Firearms a concern: No  Gang Involvement:No   Subjective: Pt shares that "I have been alright; just hanging in there again.  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  Pt is very frustrated and she does not want to have to quit her job.  Pt shares that she has not heard anything from [place] and she is OK with that.  Pt shares that she did go to the gym one time since our last session; she has also done some things for herself as well.  [name] had kidney stones last week; they have apparently stopped moving now and she is feeling better this week.  Talked with pt about the benefits of deep breathing during the morning and the afternoon to help her reset her day during those times.  Pt shares that she was  supposed to go on a date tonight, but the guy got sick; they are planning to reschedule; she likes him the best.  She also has another date scheduled for tomorrow evening and thinks she will cancel that one; he is coming from [place], [place].  [name] starts her 3rd trimester on Sunday; still due on 11/14/22.  Pt shares she believes they will stay in [place], [place] instead of moving to [facility].  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  Pt continues to engage with her friends and one of them is helping pt plan a baby shower for [place] on 09/25/22.  Encouraged pt to continue with her self care activities and we will meet in 2 wks for a follow up session.  Interventions: Cognitive Behavioral Therapy  Diagnosis:Adjustment disorder with mixed anxiety and depressed mood  Plan: Treatment Plan Strengths/Abilities:  Intelligent, Intuitive, Willing to participate in therapy Treatment Preferences:  Outpatient Individual Therapy Statement of Needs:  Patient is to use CBT, mindfulness and coping skills to help manage and/or decrease symptoms associated with their diagnosis. Symptoms:  Depressed/Irritable mood, worry, social withdrawal Problems Addressed:  Depressive thoughts, Sadness, Sleep issues, etc. Long Term Goals:  Pt to reduce overall level, frequency, and intensity of the feelings of depression/anxiety as evidenced by decreased irritability, negative self talk, and helpless  feelings from 6 to 7 days/week to 0 to 1 days/week, per client report, for at least 3 consecutive months.  Progress: 20% Short Term Goals:  Pt to verbally express understanding of the relationship between feelings of depression/anxiety and their impact on thinking patterns and behaviors.  Pt to verbalize an understanding of the role that distorted thinking plays in creating fears, excessive worry, and ruminations.  Progress: 20% Target Date:  03/21/2023 Frequency:  Bi-weekly Modality:  Cognitive  Behavioral Therapy Interventions by Therapist:  Therapist will use CBT, Mindfulness exercises, Coping skills and Referrals, as needed by client. Client has verbally approved this treatment plan.  Ivan Anchors, [facility]

## 2022-08-25 ENCOUNTER — Other Ambulatory Visit (INDEPENDENT_AMBULATORY_CARE_PROVIDER_SITE_OTHER): Payer: Self-pay | Admitting: Family Medicine

## 2022-08-25 DIAGNOSIS — F3289 Other specified depressive episodes: Secondary | ICD-10-CM

## 2022-08-26 ENCOUNTER — Ambulatory Visit (INDEPENDENT_AMBULATORY_CARE_PROVIDER_SITE_OTHER): Payer: BC Managed Care – PPO | Admitting: Family Medicine

## 2022-09-08 ENCOUNTER — Other Ambulatory Visit (INDEPENDENT_AMBULATORY_CARE_PROVIDER_SITE_OTHER): Payer: Self-pay | Admitting: Family Medicine

## 2022-09-08 DIAGNOSIS — F3289 Other specified depressive episodes: Secondary | ICD-10-CM

## 2022-09-09 ENCOUNTER — Encounter (INDEPENDENT_AMBULATORY_CARE_PROVIDER_SITE_OTHER): Payer: Self-pay | Admitting: Family Medicine

## 2022-09-09 DIAGNOSIS — F3289 Other specified depressive episodes: Secondary | ICD-10-CM

## 2022-09-10 ENCOUNTER — Ambulatory Visit (INDEPENDENT_AMBULATORY_CARE_PROVIDER_SITE_OTHER): Payer: BC Managed Care – PPO | Admitting: Psychology

## 2022-09-10 DIAGNOSIS — F4323 Adjustment disorder with mixed anxiety and depressed mood: Secondary | ICD-10-CM | POA: Diagnosis not present

## 2022-09-10 MED ORDER — TOPIRAMATE 50 MG PO TABS: 50.0000 mg | ORAL_TABLET | Freq: Every day | ORAL | 0 refills | Status: DC

## 2022-09-10 NOTE — Progress Notes (Signed)
[facility] Counselor/Therapist Progress Note  Patient ID: Jillian Green, MRN: [id number],    Date: 09/10/2022  Time Spent: 45 mins  Treatment Type: Individual Therapy  Reported Symptoms: Pt presents for session, via webex video.  Pt grants consent for the session, stating she is in her home with no one else present.  I shared with pt that I am in my office with no one else here either.  Mental Status Exam: Appearance:  Casual     Behavior: Appropriate  Motor: Normal  Speech/Language:  Clear and Coherent  Affect: Appropriate  Mood: normal  Thought process: normal  Thought content:   WNL  Sensory/Perceptual disturbances:   WNL  Orientation: oriented to person, place, and time/date  Attention: Good  Concentration: Good  Memory: WNL  Fund of knowledge:  Good  Insight:   Good  Judgment:  Good  Impulse Control: Good   Risk Assessment: Danger to Self:  No Self-injurious Behavior: No Danger to Others: No Duty to Warn:no Physical Aggression / Violence:No  Access to Firearms a concern: No  Gang Involvement:No   Subjective: Pt shares that "I have been alright; things have been pretty good but I am having some stress."  Pt shares that her mom and sister offered to [place] to give her a baby shower and then wanted pt to do it.  That frustrated pt.  Pt's mom is having a knee replacement this coming Monday.  Her sister will help some with the shower (2/22).  Jillian [place] is doing well; the baby is doing well; Jillian Green does not feel great right now; she is still due on 11/14/22.  Pt shares that Jillian Green kept her dogs recently and that went fine.  Pt booked movers today to get the rest of her things out of the house (4/5).  She will not allow her attorney to pursue legal issues until she gets her belongings out of the house.  Pt shares that work is going pretty well; "they have been leaving me alone lately and that is good."  Pt shares that she will be looking for jobs mainly in the  [place] area since [place] will be living there.  Pt shares she went to the gym once since our last session.  She has also tried to be intentional about taking care of herself and her needs.  Pt shares that she is talking with another guy that she likes and hopes they might be able to meet this weekend.  Pt has been sleeping better lately and feels good about that.  Encouraged pt to continue with her self care activities and we will meet in 4 wks for a follow up session.  Interventions: Cognitive Behavioral Therapy  Diagnosis:Adjustment disorder with mixed anxiety and depressed mood  Plan: Treatment Plan Strengths/Abilities:  Intelligent, Intuitive, Willing to participate in therapy Treatment Preferences:  Outpatient Individual Therapy Statement of Needs:  Patient is to use CBT, mindfulness and coping skills to help manage and/or decrease symptoms associated with their diagnosis. Symptoms:  Depressed/Irritable mood, worry, social withdrawal Problems Addressed:  Depressive thoughts, Sadness, Sleep issues, etc. Long Term Goals:  Pt to reduce overall level, frequency, and intensity of the feelings of depression/anxiety as evidenced by decreased irritability, negative self talk, and helpless feelings from 6 to 7 days/week to 0 to 1 days/week, per client report, for at least 3 consecutive months.  Progress: 20% Short Term Goals:  Pt to verbally express understanding of the relationship between feelings of depression/anxiety and their impact on  thinking patterns and behaviors.  Pt to verbalize an understanding of the role that distorted thinking plays in creating fears, excessive worry, and ruminations.  Progress: 20% Target Date:  03/21/2023 Frequency:  Bi-weekly Modality:  Cognitive Behavioral Therapy Interventions by Therapist:  Therapist will use CBT, Mindfulness exercises, Coping skills and Referrals, as needed by client. Client has verbally approved this treatment plan.  Ivan Anchors, [facility]

## 2022-09-10 NOTE — Telephone Encounter (Signed)
Ok x 1 month

## 2022-09-16 ENCOUNTER — Ambulatory Visit (INDEPENDENT_AMBULATORY_CARE_PROVIDER_SITE_OTHER): Payer: BC Managed Care – PPO | Admitting: Family Medicine

## 2022-09-16 ENCOUNTER — Encounter (INDEPENDENT_AMBULATORY_CARE_PROVIDER_SITE_OTHER): Payer: Self-pay | Admitting: Family Medicine

## 2022-09-16 VITALS — BP 134/88 | HR 89 | Temp 98.0°F | Ht 65.0 in | Wt 150.0 lb

## 2022-09-16 DIAGNOSIS — E7849 Other hyperlipidemia: Secondary | ICD-10-CM

## 2022-09-16 DIAGNOSIS — E1169 Type 2 diabetes mellitus with other specified complication: Secondary | ICD-10-CM | POA: Diagnosis not present

## 2022-09-16 DIAGNOSIS — Z7984 Long term (current) use of oral hypoglycemic drugs: Secondary | ICD-10-CM

## 2022-09-16 DIAGNOSIS — E559 Vitamin D deficiency, unspecified: Secondary | ICD-10-CM | POA: Diagnosis not present

## 2022-09-16 DIAGNOSIS — Z6825 Body mass index (BMI) 25.0-25.9, adult: Secondary | ICD-10-CM

## 2022-09-16 DIAGNOSIS — F3289 Other specified depressive episodes: Secondary | ICD-10-CM

## 2022-09-16 DIAGNOSIS — E669 Obesity, unspecified: Secondary | ICD-10-CM

## 2022-09-17 MED ORDER — TOPIRAMATE 50 MG PO TABS: 50.0000 mg | ORAL_TABLET | Freq: Every day | ORAL | 0 refills | Status: DC

## 2022-09-17 MED ORDER — METFORMIN HCL 500 MG PO TABS: 1000.0000 mg | ORAL_TABLET | Freq: Two times a day (BID) | ORAL | 0 refills | Status: DC

## 2022-09-22 ENCOUNTER — Encounter: Payer: Self-pay | Admitting: Family Medicine

## 2022-09-22 DIAGNOSIS — F988 Other specified behavioral and emotional disorders with onset usually occurring in childhood and adolescence: Secondary | ICD-10-CM

## 2022-09-22 NOTE — Telephone Encounter (Signed)
Requesting: [place]  Contract: 05/23/22 UDS: 05/23/22 Last Visit: 05/23/22 Next Visit: None Last Refill: 05/23/22 #30 and 0RF (x2)  Please Advise

## 2022-09-23 ENCOUNTER — Other Ambulatory Visit: Payer: Self-pay | Admitting: Family Medicine

## 2022-09-23 DIAGNOSIS — F988 Other specified behavioral and emotional disorders with onset usually occurring in childhood and adolescence: Secondary | ICD-10-CM

## 2022-09-23 MED ORDER — AMPHETAMINE-DEXTROAMPHET ER 20 MG PO CP24: ORAL_CAPSULE | ORAL | 0 refills | Status: DC

## 2022-09-30 NOTE — Progress Notes (Unsigned)
Chief Complaint:   OBESITY Jillian Green is here to discuss her progress with her obesity treatment plan along with follow-up of her obesity related diagnoses. Jillian Green is on the Category 3 Plan or keeping a food journal and adhering to recommended goals of 1500 calories and 100+ grams of protein and states she is following her eating plan approximately 70% of the time. Jillian Green states she is walking the stairs for 30 minutes 3 times per week.  Today's visit was #: 61 Starting weight: 184 lbs Starting date: 04/29/2018 Today's weight: 150 lbs Today's date: 09/16/2022 Total lbs lost to date: 34 Total lbs lost since last in-office visit: 0  Interim History: Jillian Green has done well with maintaining her weight loss. She is retaining a bit of water weight but her fat % has stayed steady. She has a lot of stress in her life with work and moving, but she is doing well with minimizing emotional eating behaviors.   Subjective:   1. Type 2 diabetes mellitus with other specified complication, without long-term current use of insulin (HCC) Jillian Green is on metformin and she is doing well with maintaining her weight loss. She is due for labs.   2. Other hyperlipidemia Jillian Green is doing well with her diet, exercise, and weight loss. She is due to have her labs rechecked.   3. Vitamin D deficiency Jillian Green is on OTC Vitamin D 5,000 IU, and she is due to have her labs rechecked. She is at high risk of over-replacement.   4. Emotional Eating Behavior Jillian Green is doing well with decreasing emotional eating behaviors. She has no problems with Topamax.   Assessment/Plan:   1. Type 2 diabetes mellitus with other specified complication, without long-term current use of insulin (HCC) We will check labs today, and we will refill metformin for 90 days.   - CMP14+EGFR - Insulin, random - Hemoglobin A1c - metFORMIN (GLUCOPHAGE) 500 MG tablet; Take 2 tablets (1,000 mg total) by mouth 2 (two) times daily.  Dispense: 360 tablet;  Refill: 0  2. Other hyperlipidemia We will check labs today. Jillian Green will continue with her diet and exercise.   - Lipid Panel With LDL/HDL Ratio - TSH  3. Vitamin D deficiency We will check labs today, and we will follow-up at Jillian Green's next visit.   - VITAMIN D 25 Hydroxy (Vit-D Deficiency, Fractures)  4. Emotional Eating Behavior Jillian Green will continue Topamax, and we will refill for 90 days.   - Jillian (TOPAMAX) 50 MG tablet; Take 1 tablet (50 mg total) by mouth daily.  Dispense: 90 tablet; Refill: 0  5. BMI 25.0-25.9,adult  6. Obesity, Beginning BMI 30.62 Jillian Green is currently in the action stage of change. As such, her goal is to continue with weight loss efforts. She has agreed to the Category 3 Plan.   Exercise goals: As is.   Behavioral modification strategies: increasing lean protein intake.  Green has agreed to follow-up with our clinic in 8 weeks. She was informed of the importance of frequent follow-up visits to maximize her success with intensive lifestyle modifications for her multiple health conditions.   Jillian Green was informed we would discuss her lab results at her next visit unless there is a critical issue that needs to be addressed sooner. Jillian Green agreed to keep her next visit at the agreed upon time to discuss these results.  Objective:   Blood pressure 134/88, pulse 89, temperature 98 F (36.7 C), height '5\' 5"'$  (1.651 m), weight 150 lb (68 kg), last menstrual period  09/12/2018, SpO2 99 %. Body mass index is 24.96 kg/m.  General: Cooperative, alert, well developed, in no acute distress. HEENT: Conjunctivae and lids unremarkable. Cardiovascular: Regular rhythm.  Lungs: Normal work of breathing. Neurologic: No focal deficits.   Lab Results  Component Value Date   CREATININE 0.85 01/21/2022   BUN 14 01/21/2022   NA 139 01/21/2022   K 4.4 01/21/2022   CL 100 01/21/2022   CO2 22 01/21/2022   Lab Results  Component Value Date   ALT 22 01/21/2022   AST 18  01/21/2022   ALKPHOS 109 01/21/2022   BILITOT 0.6 01/21/2022   Lab Results  Component Value Date   HGBA1C 6.4 (H) 01/21/2022   HGBA1C 6.2 (H) 07/01/2021   HGBA1C 6.1 (H) 02/18/2021   HGBA1C 5.8 (H) 06/18/2020   HGBA1C 6.0 (H) 02/17/2020   Lab Results  Component Value Date   INSULIN 12.1 01/21/2022   INSULIN 10.1 07/01/2021   INSULIN 7.6 02/18/2021   INSULIN 14.0 06/18/2020   INSULIN 9.9 02/17/2020   Lab Results  Component Value Date   TSH 0.770 04/29/2018   Lab Results  Component Value Date   CHOL 211 (H) 01/21/2022   HDL 80 01/21/2022   LDLCALC 115 (H) 01/21/2022   TRIG 90 01/21/2022   CHOLHDL 2.8 02/18/2021   Lab Results  Component Value Date   VD25OH 38.6 01/21/2022   VD25OH 43.5 07/01/2021   VD25OH 41.1 02/18/2021   Lab Results  Component Value Date   WBC 6.8 04/29/2018   HGB 13.7 04/29/2018   HCT 42.3 04/29/2018   MCV 95 04/29/2018   PLT 305 10/23/2016   Lab Results  Component Value Date   IRON 156 (H) 09/23/2013   FERRITIN 103.5 09/23/2013   Attestation Statements:   Reviewed by clinician on day of visit: allergies, medications, problem list, medical history, surgical history, family history, social history, and previous encounter notes.  I have personally spent 42 minutes total time today in preparation, patient care, and documentation for this visit, including the following: review of clinical lab tests; review of medical tests/procedures/services.  I, Trixie Dredge, am acting as transcriptionist for Dennard Nip, MD.  I have reviewed the above documentation for accuracy and completeness, and I agree with the above. -  Dennard Nip, MD

## 2022-10-09 ENCOUNTER — Ambulatory Visit (INDEPENDENT_AMBULATORY_CARE_PROVIDER_SITE_OTHER): Payer: BC Managed Care – PPO | Admitting: Psychology

## 2022-10-09 DIAGNOSIS — F4323 Adjustment disorder with mixed anxiety and depressed mood: Secondary | ICD-10-CM

## 2022-10-09 NOTE — Progress Notes (Signed)
[facility] Counselor/Therapist Progress Note  Patient ID: Jillian Green, MRN: [id number],    Date: 10/09/2022  Time Spent: 45 mins  Treatment Type: Individual Therapy  Reported Symptoms: Pt presents for session, via webex video.  Pt grants consent for the session, stating she is in her office with no one else present.  I shared with pt that I am in my office with no one else here either.  Mental Status Exam: Appearance:  Casual     Behavior: Appropriate  Motor: Normal  Speech/Language:  Clear and Coherent  Affect: Appropriate  Mood: normal  Thought process: normal  Thought content:   WNL  Sensory/Perceptual disturbances:   WNL  Orientation: oriented to person, place, and time/date  Attention: Good  Concentration: Good  Memory: WNL  Fund of knowledge:  Good  Insight:   Good  Judgment:  Good  Impulse Control: Good   Risk Assessment: Danger to Self:  No Self-injurious Behavior: No Danger to Others: No Duty to Warn:no Physical Aggression / Violence:No  Access to Firearms a concern: No  Gang Involvement:No   Subjective: Pt shares that "I have been alright; things have been pretty good but I am having some stress."  Pt shares that they gave [name] her baby shower and that went well.  "I am not talking to my mom right now because she tried to take over the shower preparations.  She even told [name] that I had not done anything to help with the shower.  I called her this past week and told her to stop talking bad about me and I did not appreciate what she was saying about me."  [name]'s last day of work was last Friday and her due date is still 11/14/22 (baby girl).  Pt shares her mom did have her knee replacement surgery and has not done as well as she did with the first one.  Pt shares that [place] sent her another text asking if she planned to come back; he indicated that he did not understand why she left.  She told him why she left and what she would need from  him to reconcile.  Gerald Stabs' dad also had a stroke a couple of weeks ago and he is now back at home recovering.  Pt shares she has rented an apt and will be moving into it in mid-April, after [place] has her baby.  She is planning to go get the rest of her things out of the house with Gerald Stabs on 4/4.  Her attorney will file her divorce paperwork about this same time.  Pt shares that work has gotten more stressful but she is filling better about her performance; she has gotten positive feedback from her leaders as well.  Pt shares she has been out with friends and has also been on several dates as well.  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  Pt shares her current contract will end 6/30.  Pt has been sleeping better lately and feels good about that.  Encouraged pt to continue with her self care activities and we will meet in 3 wks for a follow up session.  Interventions: Cognitive Behavioral Therapy  Diagnosis:Adjustment disorder with mixed anxiety and depressed mood  Plan: Treatment Plan Strengths/Abilities:  Intelligent, Intuitive, Willing to participate in therapy Treatment Preferences:  Outpatient Individual Therapy Statement of Needs:  Patient is to use CBT, mindfulness and coping  skills to help manage and/or decrease symptoms associated with their diagnosis. Symptoms:  Depressed/Irritable mood, worry, social withdrawal Problems Addressed:  Depressive thoughts, Sadness, Sleep issues, etc. Long Term Goals:  Pt to reduce overall level, frequency, and intensity of the feelings of depression/anxiety as evidenced by decreased irritability, negative self talk, and helpless feelings from 6 to 7 days/week to 0 to 1 days/week, per client report, for at least 3 consecutive months.  Progress: 20% Short Term Goals:  Pt to verbally express understanding of the relationship between feelings of depression/anxiety and their impact on  thinking patterns and behaviors.  Pt to verbalize an understanding of the role that distorted thinking plays in creating fears, excessive worry, and ruminations.  Progress: 20% Target Date:  03/21/2023 Frequency:  Bi-weekly Modality:  Cognitive Behavioral Therapy Interventions by Therapist:  Therapist will use CBT, Mindfulness exercises, Coping skills and Referrals, as needed by client. Client has verbally approved this treatment plan.  Ivan Anchors, [facility]

## 2022-10-24 ENCOUNTER — Encounter: Payer: Self-pay | Admitting: Family Medicine

## 2022-10-24 ENCOUNTER — Other Ambulatory Visit: Payer: Self-pay | Admitting: Family Medicine

## 2022-10-24 DIAGNOSIS — F988 Other specified behavioral and emotional disorders with onset usually occurring in childhood and adolescence: Secondary | ICD-10-CM

## 2022-10-24 MED ORDER — LISDEXAMFETAMINE DIMESYLATE 30 MG PO CAPS: 30.0000 mg | ORAL_CAPSULE | Freq: Every day | ORAL | 0 refills | Status: DC

## 2022-10-28 ENCOUNTER — Other Ambulatory Visit: Payer: Self-pay | Admitting: Family Medicine

## 2022-10-28 DIAGNOSIS — F988 Other specified behavioral and emotional disorders with onset usually occurring in childhood and adolescence: Secondary | ICD-10-CM

## 2022-10-28 MED ORDER — AMPHETAMINE-DEXTROAMPHETAMINE 30 MG PO TABS: 30.0000 mg | ORAL_TABLET | Freq: Every day | ORAL | 0 refills | Status: DC

## 2022-10-28 NOTE — Telephone Encounter (Signed)
Spoke w/ [facility]- rx for Vyvanse canceled.

## 2022-10-30 ENCOUNTER — Ambulatory Visit (INDEPENDENT_AMBULATORY_CARE_PROVIDER_SITE_OTHER): Payer: BC Managed Care – PPO | Admitting: Psychology

## 2022-10-30 DIAGNOSIS — E1169 Type 2 diabetes mellitus with other specified complication: Secondary | ICD-10-CM | POA: Diagnosis not present

## 2022-10-30 DIAGNOSIS — F4323 Adjustment disorder with mixed anxiety and depressed mood: Secondary | ICD-10-CM | POA: Diagnosis not present

## 2022-10-30 DIAGNOSIS — E559 Vitamin D deficiency, unspecified: Secondary | ICD-10-CM | POA: Diagnosis not present

## 2022-10-30 DIAGNOSIS — E7849 Other hyperlipidemia: Secondary | ICD-10-CM | POA: Diagnosis not present

## 2022-10-30 NOTE — Progress Notes (Signed)
[facility] Counselor/Therapist Progress Note  Patient ID: Jillian Green, MRN: [id number],    Date: 10/30/2022  Time Spent: 45 mins  Treatment Type: Individual Therapy  Reported Symptoms: Pt presents for session, via webex video.  Pt grants consent for the session, stating she is in her office with no one else present.  I shared with pt that I am in my office with no one else here either.  Mental Status Exam: Appearance:  Casual     Behavior: Appropriate  Motor: Normal  Speech/Language:  Clear and Coherent  Affect: Appropriate  Mood: normal  Thought process: normal  Thought content:   WNL  Sensory/Perceptual disturbances:   WNL  Orientation: oriented to person, place, and time/date  Attention: Good  Concentration: Good  Memory: WNL  Fund of knowledge:  Good  Insight:   Good  Judgment:  Good  Impulse Control: Good   Risk Assessment: Danger to Self:  No Self-injurious Behavior: No Danger to Others: No Duty to Warn:no Physical Aggression / Violence:No  Access to Firearms a concern: No  Gang Involvement:No   Subjective: Pt shares that "I have been alright; things have been pretty good.  I am moving next week, during Spring Break."  Pt is moving to an apt in [place]; she is going to get a few things from the house that Gerald Stabs is still in as well.  Pt shares that work is going fine; nothing good or bad but work is still moving forward.  Ellender Hose is doing well with the pregnancy and she is ready to have the baby already; her due date is still 11/14/22 and she is having a baby girl ([name]).  Pt shares that Gerald Stabs has continued to email her about reconciliation and she has continued to tell him she is not interested in doing that; she has told him that he has not done anything to try to show her that he wants her back other than emailing her.  Pt shares that she has been trying the dating world for a little and it is not a lot of fun for her.  Gerald Stabs' dad is  recovering from his stroke and is back at the facility where they lived before; they are now in assisted living.  Pt shares that work continues to go well; "I have stuck up for myself a time or two and I was proud of that."  Pt shares that her attorney is going to file for divorce on 5/1; after the move and after the baby comes.  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  Pt shares her current contract will end 6/30.  If she needs to stay at her current job next year, that will be OK for her.  Pt has been sleeping better lately and feels good about that.  Encouraged pt to continue with her self care activities and we will meet in 3 wks for a follow up session.  Interventions: Cognitive Behavioral Therapy  Diagnosis:Adjustment disorder with mixed anxiety and depressed mood  Plan: Treatment Plan Strengths/Abilities:  Intelligent, Intuitive, Willing to participate in therapy Treatment Preferences:  Outpatient Individual Therapy Statement of Needs:  Patient is to use CBT, mindfulness and coping skills to help manage and/or decrease symptoms associated with their diagnosis. Symptoms:  Depressed/Irritable mood, worry, social withdrawal Problems Addressed:  Depressive thoughts, Sadness, Sleep issues, etc. Long Term Goals:  Pt to reduce overall  level, frequency, and intensity of the feelings of depression/anxiety as evidenced by decreased irritability, negative self talk, and helpless feelings from 6 to 7 days/week to 0 to 1 days/week, per client report, for at least 3 consecutive months.  Progress: 20% Short Term Goals:  Pt to verbally express understanding of the relationship between feelings of depression/anxiety and their impact on thinking patterns and behaviors.  Pt to verbalize an understanding of the role that distorted thinking plays in creating fears, excessive worry, and ruminations.  Progress: 20% Target Date:   03/21/2023 Frequency:  Bi-weekly Modality:  Cognitive Behavioral Therapy Interventions by Therapist:  Therapist will use CBT, Mindfulness exercises, Coping skills and Referrals, as needed by client. Client has verbally approved this treatment plan.  Ivan Anchors, [facility]

## 2022-10-31 LAB — CMP14+EGFR
ALT: 17 IU/L (ref 0–32)
AST: 7 IU/L (ref 0–40)
Albumin/Globulin Ratio: 2.1 (ref 1.2–2.2)
Albumin: 4.7 g/dL (ref 3.9–4.9)
Alkaline Phosphatase: 112 IU/L (ref 44–121)
BUN/Creatinine Ratio: 14 (ref 9–23)
BUN: 11 mg/dL (ref 6–24)
Bilirubin Total: 0.3 mg/dL (ref 0.0–1.2)
CO2: 22 mmol/L (ref 20–29)
Calcium: 9.6 mg/dL (ref 8.7–10.2)
Chloride: 104 mmol/L (ref 96–106)
Creatinine, Ser: 0.8 mg/dL (ref 0.57–1.00)
Globulin, Total: 2.2 g/dL (ref 1.5–4.5)
Glucose: 163 mg/dL — ABNORMAL HIGH (ref 70–99)
Potassium: 4 mmol/L (ref 3.5–5.2)
Sodium: 141 mmol/L (ref 134–144)
Total Protein: 6.9 g/dL (ref 6.0–8.5)
eGFR: 90 mL/min/{1.73_m2} (ref >59)

## 2022-10-31 LAB — LIPID PANEL WITH LDL/HDL RATIO
Cholesterol, Total: 191 mg/dL (ref 100–199)
HDL: 76 mg/dL (ref >39)
LDL Chol Calc (NIH): 100 mg/dL — ABNORMAL HIGH (ref 0–99)
LDL/HDL Ratio: 1.3 ratio (ref 0.0–3.2)
Triglycerides: 86 mg/dL (ref 0–149)
VLDL Cholesterol Cal: 15 mg/dL (ref 5–40)

## 2022-10-31 LAB — VITAMIN D 25 HYDROXY (VIT D DEFICIENCY, FRACTURES): Vit D, 25-Hydroxy: 39.5 ng/mL (ref 30.0–100.0)

## 2022-10-31 LAB — HEMOGLOBIN A1C
Est. average glucose Bld gHb Est-mCnc: 134 mg/dL
Hgb A1c MFr Bld: 6.3 % — ABNORMAL HIGH (ref 4.8–5.6)

## 2022-10-31 LAB — INSULIN, RANDOM: INSULIN: 17.9 u[IU]/mL (ref 2.6–24.9)

## 2022-10-31 LAB — TSH: TSH: 1.06 u[IU]/mL (ref 0.450–4.500)

## 2022-11-05 ENCOUNTER — Ambulatory Visit (INDEPENDENT_AMBULATORY_CARE_PROVIDER_SITE_OTHER): Payer: BC Managed Care – PPO | Admitting: Family Medicine

## 2022-11-05 ENCOUNTER — Encounter (INDEPENDENT_AMBULATORY_CARE_PROVIDER_SITE_OTHER): Payer: Self-pay | Admitting: Family Medicine

## 2022-11-05 ENCOUNTER — Other Ambulatory Visit (INDEPENDENT_AMBULATORY_CARE_PROVIDER_SITE_OTHER): Payer: Self-pay | Admitting: Family Medicine

## 2022-11-05 VITALS — BP 117/74 | HR 90 | Temp 98.0°F | Ht 65.0 in | Wt 148.0 lb

## 2022-11-05 DIAGNOSIS — F3289 Other specified depressive episodes: Secondary | ICD-10-CM

## 2022-11-05 DIAGNOSIS — E669 Obesity, unspecified: Secondary | ICD-10-CM | POA: Diagnosis not present

## 2022-11-05 DIAGNOSIS — E559 Vitamin D deficiency, unspecified: Secondary | ICD-10-CM

## 2022-11-05 DIAGNOSIS — Z7984 Long term (current) use of oral hypoglycemic drugs: Secondary | ICD-10-CM

## 2022-11-05 DIAGNOSIS — E1169 Type 2 diabetes mellitus with other specified complication: Secondary | ICD-10-CM | POA: Diagnosis not present

## 2022-11-05 DIAGNOSIS — Z6824 Body mass index (BMI) 24.0-24.9, adult: Secondary | ICD-10-CM

## 2022-11-05 MED ORDER — TOPIRAMATE 50 MG PO TABS: 50.0000 mg | ORAL_TABLET | Freq: Every day | ORAL | 1 refills | Status: DC

## 2022-11-05 MED ORDER — ERGOCALCIFEROL 1.25 MG (50000 UT) PO CAPS: 50000.0000 [IU] | ORAL_CAPSULE | ORAL | 1 refills | Status: DC

## 2022-11-05 MED ORDER — METFORMIN HCL 500 MG PO TABS: 1000.0000 mg | ORAL_TABLET | Freq: Two times a day (BID) | ORAL | 0 refills | Status: DC

## 2022-11-05 NOTE — Progress Notes (Unsigned)
Chief Complaint:   OBESITY Jillian Green is here to discuss her progress with her obesity treatment plan along with follow-up of her obesity related diagnoses. Jillian Green is on the Category 3 Plan and states she is following her eating plan approximately 60% of the time. Jillian Green states she is doing 0 minutes 0 times per week.  Today's visit was #: 65 Starting weight: 184 lbs Starting date: 04/29/2018 Today's weight: 148 lbs Today's date: 11/05/2022 Total lbs lost to date: 36 Total lbs lost since last in-office visit: 2  Interim History: Jillian Green has done well with her weight loss.  She has been using factor meals to help her stay on track and so she does not have to cook much.  Subjective:   1. Type 2 diabetes mellitus with other specified complication, without long-term current use of insulin Jillian Green is on metformin, and she is working on her diet.  Her A1c was at goal, and she denies nausea or vomiting.  I discussed labs with the patient today.  2. Vitamin D deficiency Jillian Green is on vitamin D, and her level is below goal.  She denies nausea, vomiting, or muscle weakness.  I discussed labs with the patient today.  3. Emotional Eating Behavior Jillian Green's stress has been very high from multiple causes and she is feeling overwhelmed at times.  She is working on not turning to food for comfort and she is doing reasonably well.  Assessment/Plan:   1. Type 2 diabetes mellitus with other specified complication, without long-term current use of insulin Jillian Green will continue with her diet and exercise, and she will continue metformin.  We will refill metformin for 2 months.  - metFORMIN (GLUCOPHAGE) 500 MG tablet; Take 2 tablets (1,000 mg total) by mouth 2 (two) times daily.  Dispense: 120 tablet; Refill: 0  2. Vitamin D deficiency Jillian Green will continue prescription vitamin D, and we will refill for 2 months.  - ergocalciferol (VITAMIN D2) 1.25 MG (50000 UT) capsule; Take 1 capsule (50,000 Units total) by mouth  once a week.  Dispense: 4 capsule; Refill: 1  3. Emotional Eating Behavior Jillian Green will continue Topamax and Wellbutrin, and she will continue with her therapist as is.  We will refill Topamax for 2 months.  - topiramate (TOPAMAX) 50 MG tablet; Take 1 tablet (50 mg total) by mouth daily.  Dispense: 30 tablet; Refill: 1  4. BMI 24.0-24.9, adult  5. Obesity, Beginning BMI 30.62 Jillian Green is currently in the action stage of change. As such, her goal is to continue with weight loss efforts. She has agreed to keeping a food journal and adhering to recommended goals of 1500 calories and 100+ grams of protein daily.   Exercise goals: All adults should avoid inactivity. Some physical activity is better than none, and adults who participate in any amount of physical activity gain some health benefits.  Behavioral modification strategies: increasing lean protein intake and keeping a strict food journal.  Jillian Green has agreed to follow-up with our clinic in 4 weeks. She was informed of the importance of frequent follow-up visits to maximize her success with intensive lifestyle modifications for her multiple health conditions.   Objective:   Blood pressure 117/74, pulse 90, temperature 98 F (36.7 C), height 5\' 5"  (1.651 m), weight 148 lb (67.1 kg), last menstrual period 09/12/2018, SpO2 97 %. Body mass index is 24.63 kg/m.  Lab Results  Component Value Date   CREATININE 0.80 10/30/2022   BUN 11 10/30/2022   NA 141 10/30/2022  K 4.0 10/30/2022   CL 104 10/30/2022   CO2 22 10/30/2022   Lab Results  Component Value Date   ALT 17 10/30/2022   AST 7 10/30/2022   ALKPHOS 112 10/30/2022   BILITOT 0.3 10/30/2022   Lab Results  Component Value Date   HGBA1C 6.3 (H) 10/30/2022   HGBA1C 6.4 (H) 01/21/2022   HGBA1C 6.2 (H) 07/01/2021   HGBA1C 6.1 (H) 02/18/2021   HGBA1C 5.8 (H) 06/18/2020   Lab Results  Component Value Date   INSULIN 17.9 10/30/2022   INSULIN 12.1 01/21/2022   INSULIN 10.1  07/01/2021   INSULIN 7.6 02/18/2021   INSULIN 14.0 06/18/2020   Lab Results  Component Value Date   TSH 1.060 10/30/2022   Lab Results  Component Value Date   CHOL 191 10/30/2022   HDL 76 10/30/2022   LDLCALC 100 (H) 10/30/2022   TRIG 86 10/30/2022   CHOLHDL 2.8 02/18/2021   Lab Results  Component Value Date   VD25OH 39.5 10/30/2022   VD25OH 38.6 01/21/2022   VD25OH 43.5 07/01/2021   Lab Results  Component Value Date   WBC 6.8 04/29/2018   HGB 13.7 04/29/2018   HCT 42.3 04/29/2018   MCV 95 04/29/2018   PLT 305 10/23/2016   Lab Results  Component Value Date   IRON 156 (H) 09/23/2013   FERRITIN 103.5 09/23/2013   Attestation Statements:   Reviewed by clinician on day of visit: allergies, medications, problem list, medical history, surgical history, family history, social history, and previous encounter notes.  I, Trixie Dredge, am acting as transcriptionist for Dennard Nip, MD.  I have reviewed the above documentation for accuracy and completeness, and I agree with the above. -  Dennard Nip, MD

## 2022-11-27 ENCOUNTER — Ambulatory Visit: Payer: BC Managed Care – PPO | Admitting: Psychology

## 2022-11-28 ENCOUNTER — Other Ambulatory Visit: Payer: Self-pay | Admitting: Family Medicine

## 2022-11-28 DIAGNOSIS — M797 Fibromyalgia: Secondary | ICD-10-CM

## 2022-11-30 ENCOUNTER — Other Ambulatory Visit (INDEPENDENT_AMBULATORY_CARE_PROVIDER_SITE_OTHER): Payer: Self-pay | Admitting: Family Medicine

## 2022-11-30 DIAGNOSIS — E559 Vitamin D deficiency, unspecified: Secondary | ICD-10-CM

## 2022-12-02 ENCOUNTER — Other Ambulatory Visit: Payer: Self-pay | Admitting: Family Medicine

## 2022-12-02 ENCOUNTER — Encounter: Payer: Self-pay | Admitting: Family Medicine

## 2022-12-02 DIAGNOSIS — F988 Other specified behavioral and emotional disorders with onset usually occurring in childhood and adolescence: Secondary | ICD-10-CM

## 2022-12-02 MED ORDER — AMPHETAMINE-DEXTROAMPHET ER 30 MG PO CP24: 30.0000 mg | ORAL_CAPSULE | ORAL | 0 refills | Status: DC

## 2023-01-06 ENCOUNTER — Ambulatory Visit (INDEPENDENT_AMBULATORY_CARE_PROVIDER_SITE_OTHER): Payer: BC Managed Care – PPO | Admitting: Family Medicine

## 2023-01-13 ENCOUNTER — Other Ambulatory Visit: Payer: Self-pay | Admitting: Family

## 2023-01-13 ENCOUNTER — Encounter: Payer: Self-pay | Admitting: Family Medicine

## 2023-01-13 DIAGNOSIS — F988 Other specified behavioral and emotional disorders with onset usually occurring in childhood and adolescence: Secondary | ICD-10-CM

## 2023-01-13 MED ORDER — AMPHETAMINE-DEXTROAMPHETAMINE 30 MG PO TABS: 30.0000 mg | ORAL_TABLET | Freq: Every day | ORAL | 0 refills | Status: DC

## 2023-01-13 NOTE — Telephone Encounter (Signed)
Requesting: Adderall XR 30mg   Contract: 05/23/22 UDS: 05/23/22 Last Visit: 05/23/22 Next Visit: None Last Refill: 12/02/22 #30 and 0RF   Please Advise

## 2023-01-15 NOTE — Progress Notes (Signed)
TeleHealth Visit:  This visit was completed with telemedicine (audio/video) technology. Jillian Green has verbally consented to this TeleHealth visit. The patient is located at home, the provider is located at home. The participants in this visit include the listed provider and patient. The visit was conducted today via MyChart video.  OBESITY Jillian Green is here to discuss her progress with her obesity treatment plan along with follow-up of her obesity related diagnoses.   Today's visit was # 43 Starting weight: 184 lbs Starting date: 04/29/2018 Weight at last in office visit: 148 lbs on 11/05/22 Total weight loss: 36 lbs at last in office visit on 11/05/22. Today's reported weight (01/26/23):  153 lbs  Nutrition Plan: keeping a food journal with goal of 1500 calories and 100+ grams of protein daily   Current exercise: cardio 30 minutes and weights twice weekly at the gym.   Interim History:  She is working on weight maintenance.  She is up a little bit of weight from her last appointment in April. She did not journal much after her last appointment but wasn't doing well so she restarted this week. She has been working on drinking more water. She has been struggling with sweets cravings recently.  Her 97 year old daughter recently had a baby girl.  Recently started back to the gym and is going about twice weekly.  She wants to increase the frequency. 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 Assessment/Plan:  1. Other depression/emotional eating [name] has had issues with stress/emotional eating.  Currently struggling with sweets cravings.   Medication(s): Topiramate 50 mg daily.  She feels this helps with hunger and cravings.  Also taking bupropion XL 300 mg daily and Cymbalta 60 mg twice daily per PCP.    Plan: Continue and refill Topiramate 50 mg daily Continue bupropion and Cymbalta per PCP.  2. Type 2 Diabetes Mellitus with other specified complication, without  long-term current use of insulin HgbA1c is at goal. Last A1c was 6.3 on 10/30/2022 Medication(s): Metformin 1000 mg twice daily. Reports she was not able to tolerate Rybelsus or injectable GLP-1's.  Lab Results  Component Value Date   HGBA1C 6.3 (H) 10/30/2022   HGBA1C 6.4 (H) 01/21/2022   HGBA1C 6.2 (H) 07/01/2021   Lab Results  Component Value Date   LDLCALC 100 (H) 10/30/2022   CREATININE 0.80 10/30/2022   Lab Results  Component Value Date   GFR 99.95 05/12/2016   GFR 75.22 05/07/2016   GFR 101.21 09/23/2013    Plan: Continue and refill metformin 1000 mg twice daily. Work on reducing simple carbohydrates.  Do not keep sweets in the house.   3. Vitamin D Deficiency Vitamin D is not at goal of 50.  Most recent vitamin D level was 39.5 on 10/30/2022.[name] She is on  prescription ergocalciferol 50,000 IU weekly. Lab Results  Component Value Date   VD25OH 39.5 10/30/2022   VD25OH 38.6 01/21/2022   VD25OH 43.5 07/01/2021    Plan: Continue and refill  prescription ergocalciferol 50,000 IU weekly   Generalized Obesity: Current BMI 24  [name] is currently in the action stage of change. As such, her goal is to continue with weight loss efforts.  She has agreed to keeping a food journal with goal of 1500 calories and 100 grams of protein daily.  Exercise goals: Cardio and resistance at the gym 3 times weekly.  Behavioral modification strategies: decreasing simple carbohydrates  and planning for success.  [name] has agreed to follow-up with our clinic in  6 weeks.  No orders of the defined types were placed in this encounter.   Medications Discontinued During This Encounter  Medication Reason   Semaglutide (RYBELSUS) 3 MG TABS Side effect (s)   atorvastatin (LIPITOR) 10 MG tablet Side effect (s)   topiramate (TOPAMAX) 50 MG tablet Reorder   metFORMIN (GLUCOPHAGE) 500 MG tablet Reorder   ergocalciferol (VITAMIN D2) 1.25 MG (50000 UT) capsule Reorder     Meds ordered this  encounter  Medications   topiramate (TOPAMAX) 50 MG tablet    Sig: Take 1 tablet (50 mg total) by mouth daily.    Dispense:  30 tablet    Refill:  1    Order Specific Question:   Supervising Provider    Answer:   Glennis Brink [[id number]]   metFORMIN (GLUCOPHAGE) 500 MG tablet    Sig: Take 2 tablets (1,000 mg total) by mouth 2 (two) times daily.    Dispense:  120 tablet    Refill:  0    Order Specific Question:   Supervising Provider    Answer:   Carolin Sicks   ergocalciferol (VITAMIN D2) 1.25 MG (50000 UT) capsule    Sig: Take 1 capsule (50,000 Units total) by mouth once a week.    Dispense:  4 capsule    Refill:  1    Order Specific Question:   Supervising Provider    Answer:   Glennis Brink [[id number]]      Objective:   VITALS: Per patient if applicable, see vitals. GENERAL: Alert and in no acute distress. CARDIOPULMONARY: No increased WOB. Speaking in clear sentences.  PSYCH: Pleasant and cooperative. Speech normal rate and rhythm. Affect is appropriate. Insight and judgement are appropriate. Attention is focused, linear, and appropriate.  NEURO: Oriented as arrived to appointment on time with no prompting.   Attestation Statements:   Reviewed by clinician on day of visit: allergies, medications, problem list, medical history, surgical history, family history, social history, and previous encounter notes.   This was prepared with the assistance of Engineer, civil (consulting).  Occasional wrong-word or sound-a-like substitutions may have occurred due to the inherent limitations of voice recognition software.

## 2023-01-26 ENCOUNTER — Encounter (INDEPENDENT_AMBULATORY_CARE_PROVIDER_SITE_OTHER): Payer: Self-pay | Admitting: Family Medicine

## 2023-01-26 ENCOUNTER — Telehealth (INDEPENDENT_AMBULATORY_CARE_PROVIDER_SITE_OTHER): Payer: BC Managed Care – PPO | Admitting: Family Medicine

## 2023-01-26 DIAGNOSIS — E669 Obesity, unspecified: Secondary | ICD-10-CM | POA: Diagnosis not present

## 2023-01-26 DIAGNOSIS — E1169 Type 2 diabetes mellitus with other specified complication: Secondary | ICD-10-CM | POA: Diagnosis not present

## 2023-01-26 DIAGNOSIS — Z7984 Long term (current) use of oral hypoglycemic drugs: Secondary | ICD-10-CM

## 2023-01-26 DIAGNOSIS — F3289 Other specified depressive episodes: Secondary | ICD-10-CM | POA: Diagnosis not present

## 2023-01-26 DIAGNOSIS — Z6824 Body mass index (BMI) 24.0-24.9, adult: Secondary | ICD-10-CM

## 2023-01-26 DIAGNOSIS — E559 Vitamin D deficiency, unspecified: Secondary | ICD-10-CM

## 2023-01-26 MED ORDER — ERGOCALCIFEROL 1.25 MG (50000 UT) PO CAPS: 50000.0000 [IU] | ORAL_CAPSULE | ORAL | 1 refills | Status: DC

## 2023-01-26 MED ORDER — METFORMIN HCL 500 MG PO TABS: 1000.0000 mg | ORAL_TABLET | Freq: Two times a day (BID) | ORAL | 0 refills | Status: DC

## 2023-01-26 MED ORDER — TOPIRAMATE 50 MG PO TABS: 50.0000 mg | ORAL_TABLET | Freq: Every day | ORAL | 1 refills | Status: DC

## 2023-02-23 ENCOUNTER — Encounter: Payer: Self-pay | Admitting: Family Medicine

## 2023-02-23 DIAGNOSIS — F988 Other specified behavioral and emotional disorders with onset usually occurring in childhood and adolescence: Secondary | ICD-10-CM

## 2023-02-24 MED ORDER — AMPHETAMINE-DEXTROAMPHET ER 30 MG PO CP24: 30.0000 mg | ORAL_CAPSULE | ORAL | 0 refills | Status: DC

## 2023-02-24 NOTE — Telephone Encounter (Signed)
Requesting: Adderrall XR Contract: 05/23/22 UDS: 05/23/2022 Last OV: 05/23/2022 Next OV: N/A Last Refill: 01/13/23, #30--0 RF Database:   Please advise

## 2023-02-28 ENCOUNTER — Other Ambulatory Visit (INDEPENDENT_AMBULATORY_CARE_PROVIDER_SITE_OTHER): Payer: Self-pay | Admitting: Family Medicine

## 2023-02-28 DIAGNOSIS — E559 Vitamin D deficiency, unspecified: Secondary | ICD-10-CM

## 2023-03-02 ENCOUNTER — Other Ambulatory Visit (INDEPENDENT_AMBULATORY_CARE_PROVIDER_SITE_OTHER): Payer: Self-pay | Admitting: Family Medicine

## 2023-03-02 DIAGNOSIS — F3289 Other specified depressive episodes: Secondary | ICD-10-CM

## 2023-03-03 ENCOUNTER — Other Ambulatory Visit: Payer: Self-pay | Admitting: Family Medicine

## 2023-03-03 DIAGNOSIS — M797 Fibromyalgia: Secondary | ICD-10-CM

## 2023-03-05 ENCOUNTER — Encounter (INDEPENDENT_AMBULATORY_CARE_PROVIDER_SITE_OTHER): Payer: Self-pay | Admitting: Family Medicine

## 2023-03-05 ENCOUNTER — Ambulatory Visit (INDEPENDENT_AMBULATORY_CARE_PROVIDER_SITE_OTHER): Payer: BC Managed Care – PPO | Admitting: Family Medicine

## 2023-03-05 VITALS — BP 102/72 | HR 94 | Temp 97.9°F | Ht 65.0 in | Wt 152.0 lb

## 2023-03-05 DIAGNOSIS — F3289 Other specified depressive episodes: Secondary | ICD-10-CM | POA: Diagnosis not present

## 2023-03-05 DIAGNOSIS — E559 Vitamin D deficiency, unspecified: Secondary | ICD-10-CM

## 2023-03-05 DIAGNOSIS — E1169 Type 2 diabetes mellitus with other specified complication: Secondary | ICD-10-CM

## 2023-03-05 DIAGNOSIS — Z7985 Long-term (current) use of injectable non-insulin antidiabetic drugs: Secondary | ICD-10-CM

## 2023-03-05 DIAGNOSIS — E669 Obesity, unspecified: Secondary | ICD-10-CM

## 2023-03-05 DIAGNOSIS — Z6825 Body mass index (BMI) 25.0-25.9, adult: Secondary | ICD-10-CM

## 2023-03-05 MED ORDER — METFORMIN HCL 500 MG PO TABS: 1000.0000 mg | ORAL_TABLET | Freq: Two times a day (BID) | ORAL | 0 refills | Status: DC

## 2023-03-05 MED ORDER — ERGOCALCIFEROL 1.25 MG (50000 UT) PO CAPS: 50000.0000 [IU] | ORAL_CAPSULE | ORAL | 1 refills | Status: DC

## 2023-03-05 MED ORDER — TOPIRAMATE 50 MG PO TABS: 50.0000 mg | ORAL_TABLET | Freq: Every day | ORAL | 1 refills | Status: DC

## 2023-03-05 NOTE — Assessment & Plan Note (Signed)
Last vitamin D Lab Results  Component Value Date   VD25OH 39.5 10/30/2022   Doing well on RX vitamin D weekly Energy level has improved  Recheck level next visit

## 2023-03-05 NOTE — Progress Notes (Signed)
Office: [phone]  /  Fax: [phone]  WEIGHT SUMMARY AND BIOMETRICS  Starting Date: 04/29/18  Starting Weight: 184lb   Weight Lost Since Last Visit: 0lb   Vitals Temp: 97.9 F (36.6 C) BP: 102/72 Pulse Rate: 94 SpO2: 100 %   Body Composition  Body Fat %: 32.9 % Fat Mass (lbs): 50.2 lbs Muscle Mass (lbs): 97.4 lbs Total Body Water (lbs): 66.4 lbs Visceral Fat Rating : 6    HPI  Chief Complaint: OBESITY  Jillian Green is here to discuss her progress with her obesity treatment plan. She is on the the Category 3 Plan and states she is following her eating plan approximately 40 % of the time. She states she is exercising 0 minutes 0 times per week.   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 Body mass index is 25.29 kg/m.  General: She is overweight, cooperative, alert, well developed, and in no acute distress. PSYCH: Has normal mood, affect and thought process.   Lungs: Normal breathing effort, no conversational dyspnea.   ASSESSMENT AND PLAN  TREATMENT PLAN FOR OBESITY:  Recommended Dietary Goals  Jillian Green is currently in the action stage of change. As such, her goal is to continue weight management plan. She has  agreed to the Category 3 Plan.  Behavioral Intervention  We discussed the following Behavioral Modification Strategies today: increasing lean protein intake, decreasing simple carbohydrates , increasing vegetables, increasing lower glycemic fruits, increasing water intake, work on meal planning and preparation, keeping healthy foods at home, continue to practice mindfulness when eating, and planning for success.  Additional resources provided today: NA  Recommended Physical Activity Goals  Jillian Green has been advised to work up to 150 minutes of moderate intensity aerobic activity a week and strengthening exercises 2-3 times per week for cardiovascular health, weight loss maintenance and preservation of muscle mass.   She has agreed to Start aerobic activity with a goal of 150 minutes a week at moderate intensity.  - track daily steps with a smartwatch-- aim for 10,000 steps daily  Pharmacotherapy changes for the treatment of obesity: none  ASSOCIATED CONDITIONS ADDRESSED TODAY  Type 2 diabetes mellitus with other specified complication, without long-term current use of insulin (HCC) Assessment & Plan: Lab Results  Component Value Date   HGBA1C 6.3 (H) 10/30/2022   Pt declined labs today, deferring to next visit  Previously GI intolerant to all GLP-1's.  Doing well on metformin 1000 mg bid with food without adverse SE Continues to limit intake of starches and sweets  Plan:  continue metformin 1000 mg bid with food Increase walking time, track steps with a goal of 10,000 daily Resume prescribed meal plan   Orders: -  metFORMIN HCl; Take 2 tablets (1,000 mg total) by mouth 2 (two) times daily.  Dispense: 120 tablet; Refill: 0  Emotional Eating Behavior -     Topiramate; Take 1 tablet (50 mg total) by mouth daily.  Dispense: 30 tablet; Refill: 1  Vitamin D deficiency Assessment & Plan: Last vitamin D Lab Results  Component Value Date   VD25OH 39.5 10/30/2022   Doing well on  RX vitamin D weekly Energy level has improved  Recheck level next visit  Orders: -     Ergocalciferol; Take 1 capsule (50,000 Units total) by mouth once a week.  Dispense: 4 capsule; Refill: 1  Generalized obesity with starting BMI 30  BMI 25.0-25.9,adult      She was informed of the importance of frequent follow up visits to maximize her success with intensive lifestyle modifications for her multiple health conditions.   ATTESTASTION STATEMENTS:  Reviewed by clinician on day of visit: allergies, medications, problem list, medical history, surgical history, family history, social history, and previous encounter notes pertinent to obesity diagnosis.   I have personally spent 30 minutes total time today in preparation, patient care, nutritional counseling and documentation for this visit, including the following: review of clinical lab tests; review of medical tests/procedures/services.      Jillian Brink, DO DABFM, DABOM [facility] [address] [place], [place] [address] [phone]

## 2023-03-05 NOTE — Assessment & Plan Note (Signed)
Lab Results  Component Value Date   HGBA1C 6.3 (H) 10/30/2022   Pt declined labs today, deferring to next visit  Previously GI intolerant to all GLP-1's.  Doing well on metformin 1000 mg bid with food without adverse SE Continues to limit intake of starches and sweets  Plan:  continue metformin 1000 mg bid with food Increase walking time, track steps with a goal of 10,000 daily Resume prescribed meal plan

## 2023-03-30 ENCOUNTER — Other Ambulatory Visit: Payer: Self-pay | Admitting: Family Medicine

## 2023-03-30 DIAGNOSIS — M797 Fibromyalgia: Secondary | ICD-10-CM

## 2023-04-01 ENCOUNTER — Other Ambulatory Visit: Payer: Self-pay | Admitting: Family Medicine

## 2023-04-01 DIAGNOSIS — F988 Other specified behavioral and emotional disorders with onset usually occurring in childhood and adolescence: Secondary | ICD-10-CM

## 2023-04-01 NOTE — Telephone Encounter (Signed)
Last refill 02/23/26 #30

## 2023-04-02 MED ORDER — AMPHETAMINE-DEXTROAMPHET ER 30 MG PO CP24: 30.0000 mg | ORAL_CAPSULE | ORAL | 0 refills | Status: DC

## 2023-04-07 ENCOUNTER — Other Ambulatory Visit: Payer: Self-pay | Admitting: Family Medicine

## 2023-04-07 ENCOUNTER — Encounter: Payer: Self-pay | Admitting: Family Medicine

## 2023-04-07 DIAGNOSIS — F988 Other specified behavioral and emotional disorders with onset usually occurring in childhood and adolescence: Secondary | ICD-10-CM

## 2023-04-07 MED ORDER — LISDEXAMFETAMINE DIMESYLATE 70 MG PO CAPS: 70.0000 mg | ORAL_CAPSULE | Freq: Every day | ORAL | 0 refills | Status: DC

## 2023-04-07 NOTE — Telephone Encounter (Signed)
Pt said refill was sent to wrong pharmacy. Generic V[facility]se should be sent to [facility] on [facility].

## 2023-04-29 ENCOUNTER — Other Ambulatory Visit: Payer: Self-pay | Admitting: Family Medicine

## 2023-04-29 DIAGNOSIS — M797 Fibromyalgia: Secondary | ICD-10-CM

## 2023-05-12 ENCOUNTER — Telehealth (INDEPENDENT_AMBULATORY_CARE_PROVIDER_SITE_OTHER): Payer: Self-pay

## 2023-05-12 NOTE — Telephone Encounter (Signed)
See my chart message

## 2023-05-25 ENCOUNTER — Encounter: Payer: Self-pay | Admitting: Family Medicine

## 2023-05-25 ENCOUNTER — Ambulatory Visit: Payer: BC Managed Care – PPO | Admitting: Family Medicine

## 2023-05-25 VITALS — BP 122/87 | HR 85 | Temp 98.2°F | Ht 65.0 in | Wt 155.0 lb

## 2023-05-25 DIAGNOSIS — Z7984 Long term (current) use of oral hypoglycemic drugs: Secondary | ICD-10-CM

## 2023-05-25 DIAGNOSIS — E669 Obesity, unspecified: Secondary | ICD-10-CM

## 2023-05-25 DIAGNOSIS — E1169 Type 2 diabetes mellitus with other specified complication: Secondary | ICD-10-CM | POA: Diagnosis not present

## 2023-05-25 DIAGNOSIS — F5089 Other specified eating disorder: Secondary | ICD-10-CM

## 2023-05-25 DIAGNOSIS — E559 Vitamin D deficiency, unspecified: Secondary | ICD-10-CM

## 2023-05-25 DIAGNOSIS — E119 Type 2 diabetes mellitus without complications: Secondary | ICD-10-CM

## 2023-05-25 DIAGNOSIS — R350 Frequency of micturition: Secondary | ICD-10-CM | POA: Diagnosis not present

## 2023-05-25 DIAGNOSIS — F3289 Other specified depressive episodes: Secondary | ICD-10-CM

## 2023-05-25 DIAGNOSIS — Z6825 Body mass index (BMI) 25.0-25.9, adult: Secondary | ICD-10-CM

## 2023-05-25 MED ORDER — METFORMIN HCL 500 MG PO TABS: 1000.0000 mg | ORAL_TABLET | Freq: Two times a day (BID) | ORAL | 0 refills | Status: DC

## 2023-05-25 MED ORDER — TOPIRAMATE 50 MG PO TABS: 50.0000 mg | ORAL_TABLET | Freq: Every day | ORAL | 1 refills | Status: DC

## 2023-05-25 NOTE — Progress Notes (Signed)
Office: [phone]  /  Fax: [phone]  WEIGHT SUMMARY AND BIOMETRICS  Starting Date: 04/30/23  Starting Weight: 184lb   Weight Lost Since Last Visit: 0lb   Vitals Temp: 98.2 F (36.8 C) BP: 122/87 Pulse Rate: 85 SpO2: 95 %   Body Composition  Body Fat %: 33.6 % Fat Mass (lbs): 52.2 lbs Muscle Mass (lbs): 98 lbs Total Body Water (lbs): 65 lbs Visceral Fat Rating : 7    HPI  Chief Complaint: OBESITY  Jillian Green is here to discuss her progress with her obesity treatment plan. She is on the the Category 3 Plan and states she is following her eating plan approximately 50 % of the time. She states she is exercising 30 minutes 2 times per week.   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 Body mass index is 25.79 kg/m.  General: She is healthy appearing, cooperative, alert, well developed, and in no acute distress. PSYCH: Has normal mood, affect and thought process.   Lungs: Normal breathing effort, no conversational dyspnea.   ASSESSMENT AND PLAN  TREATMENT PLAN FOR OBESITY:  Recommended Dietary Goals  Jillian Green is currently in the action stage of change.  As such, her goal is to continue weight management plan. She has agreed to the Category 3 Plan.  Behavioral Intervention  We discussed the following Behavioral Modification Strategies today: increasing lean protein intake to established goals, decreasing simple carbohydrates , increasing vegetables, increasing lower glycemic fruits, avoiding skipping meals, increasing water intake , work on meal planning and preparation, keeping healthy foods at home, planning for success, and continue to work on maintaining a reduced calorie state, getting the recommended amount of protein, incorporating whole foods, making healthy choices, staying well hydrated and practicing mindfulness when eating..  Additional resources provided today: NA  Recommended Physical Activity Goals  Jillian Green has been advised to work up to 150 minutes of moderate intensity aerobic activity a week and strengthening exercises 2-3 times per week for cardiovascular health, weight loss maintenance and preservation of muscle mass.   She has agreed to [employer] strengthening exercises with a goal of 2-3 sessions a week  and Start aerobic activity with a goal of 150 minutes a week at moderate intensity.   Pharmacotherapy changes for the treatment of obesity: none  ASSOCIATED CONDITIONS ADDRESSED TODAY  Vitamin D deficiency -     VITAMIN D 25 Hydroxy (Vit-D Deficiency, Fractures)  Emotional Eating Behavior -     Topiramate; Take 1 tablet (50 mg total) by mouth daily.  Dispense: 30 tablet; Refill: 1  Type 2  diabetes mellitus with other specified complication, without long-term current use of insulin (HCC) -     metFORMIN HCl; Take 2 tablets (1,000 mg total) by mouth 2 (two) times daily.  Dispense: 120 tablet; Refill: 0  Type 2 diabetes mellitus without complication, without long-term current use of insulin (HCC) -     Comprehensive metabolic panel -     Vitamin B12 -     Hemoglobin A1c -     Insulin, random  Urinary frequency -      Urinalysis  Generalized obesity with starting BMI 30  BMI 25.0-25.9,adult      She was informed of the importance of frequent follow up visits to maximize her success with intensive lifestyle modifications for her multiple health conditions.   ATTESTASTION STATEMENTS:  Reviewed by clinician on day of visit: allergies, medications, problem list, medical history, surgical history, family history, social history, and previous encounter notes pertinent to obesity diagnosis.   I have personally spent 30 minutes total time today in preparation, patient care, nutritional counseling and documentation for this visit, including the following: review of clinical lab tests; review of medical tests/procedures/services.      Glennis Brink, DO DABFM, DABOM [facility] [address] [place], [place] [address] [phone]

## 2023-05-26 ENCOUNTER — Encounter: Payer: Self-pay | Admitting: Family Medicine

## 2023-05-26 LAB — URINALYSIS
Bilirubin, UA: NEGATIVE
Glucose, UA: NEGATIVE
Nitrite, UA: POSITIVE — AB
Specific Gravity, UA: 1.019 (ref 1.005–1.030)
Urobilinogen, Ur: 0.2 mg/dL (ref 0.2–1.0)
pH, UA: 6.5 (ref 5.0–7.5)

## 2023-05-26 LAB — COMPREHENSIVE METABOLIC PANEL
ALT: 16 [IU]/L (ref 0–32)
AST: 17 [IU]/L (ref 0–40)
Albumin: 4.7 g/dL (ref 3.9–4.9)
Alkaline Phosphatase: 118 [IU]/L (ref 44–121)
BUN/Creatinine Ratio: 14 (ref 9–23)
BUN: 11 mg/dL (ref 6–24)
Bilirubin Total: 0.4 mg/dL (ref 0.0–1.2)
CO2: 20 mmol/L (ref 20–29)
Calcium: 9.8 mg/dL (ref 8.7–10.2)
Chloride: 103 mmol/L (ref 96–106)
Creatinine, Ser: 0.8 mg/dL (ref 0.57–1.00)
Globulin, Total: 2.3 g/dL (ref 1.5–4.5)
Glucose: 160 mg/dL — ABNORMAL HIGH (ref 70–99)
Potassium: 4.4 mmol/L (ref 3.5–5.2)
Sodium: 143 mmol/L (ref 134–144)
Total Protein: 7 g/dL (ref 6.0–8.5)
eGFR: 90 mL/min/{1.73_m2} (ref >59)

## 2023-05-26 LAB — VITAMIN B12: Vitamin B-12: 846 pg/mL (ref 232–1245)

## 2023-05-26 LAB — INSULIN, RANDOM: INSULIN: 13.6 u[IU]/mL (ref 2.6–24.9)

## 2023-05-26 LAB — VITAMIN D 25 HYDROXY (VIT D DEFICIENCY, FRACTURES): Vit D, 25-Hydroxy: 63.5 ng/mL (ref 30.0–100.0)

## 2023-05-26 LAB — HEMOGLOBIN A1C
Est. average glucose Bld gHb Est-mCnc: 137 mg/dL
Hgb A1c MFr Bld: 6.4 % — ABNORMAL HIGH (ref 4.8–5.6)

## 2023-05-26 NOTE — Telephone Encounter (Signed)
Needs to be seen

## 2023-05-26 NOTE — Telephone Encounter (Signed)
Pt said she has a UTI and they do not prescribe antibiotics so she wants to know if PCP can send in something for the UTI as well as [facility].

## 2023-05-27 ENCOUNTER — Other Ambulatory Visit: Payer: Self-pay | Admitting: Family Medicine

## 2023-05-27 DIAGNOSIS — N390 Urinary tract infection, site not specified: Secondary | ICD-10-CM

## 2023-05-27 MED ORDER — CIPROFLOXACIN HCL 250 MG PO TABS: 250.0000 mg | ORAL_TABLET | Freq: Two times a day (BID) | ORAL | 0 refills | Status: AC

## 2023-05-27 MED ORDER — FLUCONAZOLE 150 MG PO TABS: 150.0000 mg | ORAL_TABLET | Freq: Every day | ORAL | 0 refills | Status: DC

## 2023-05-27 NOTE — Telephone Encounter (Signed)
Received message from CMA that patient needs to be seen. LVM for patient to call back. PCP already sent medication in.

## 2023-05-29 ENCOUNTER — Encounter: Payer: Self-pay | Admitting: Family Medicine

## 2023-05-29 ENCOUNTER — Ambulatory Visit: Payer: BC Managed Care – PPO | Admitting: Family Medicine

## 2023-05-29 VITALS — BP 118/76 | HR 95 | Temp 98.7°F | Resp 18 | Ht 65.0 in | Wt 160.4 lb

## 2023-05-29 DIAGNOSIS — F909 Attention-deficit hyperactivity disorder, unspecified type: Secondary | ICD-10-CM

## 2023-05-29 DIAGNOSIS — R8290 Unspecified abnormal findings in urine: Secondary | ICD-10-CM

## 2023-05-29 DIAGNOSIS — Z79899 Other long term (current) drug therapy: Secondary | ICD-10-CM | POA: Diagnosis not present

## 2023-05-29 DIAGNOSIS — F419 Anxiety disorder, unspecified: Secondary | ICD-10-CM

## 2023-05-29 DIAGNOSIS — R41840 Attention and concentration deficit: Secondary | ICD-10-CM

## 2023-05-29 MED ORDER — LISDEXAMFETAMINE DIMESYLATE 70 MG PO CAPS: 70.0000 mg | ORAL_CAPSULE | Freq: Every day | ORAL | 0 refills | Status: DC

## 2023-05-29 MED ORDER — ALPRAZOLAM 0.5 MG PO TABS
0.5000 mg | ORAL_TABLET | Freq: Three times a day (TID) | ORAL | 1 refills | Status: DC | PRN
Start: 2023-05-29 — End: 2023-11-05

## 2023-05-29 NOTE — Progress Notes (Signed)
Established Patient Office Visit  Subjective   Patient ID: Jillian Green, female    DOB: 1970-12-19  Age: 52 y.o. MRN: [id number]  Chief Complaint  Patient presents with   ADD   Follow-up    HPI Discussed the use of AI scribe software for clinical note transcription with the patient, who gave verbal consent to proceed.  History of Present Illness   The patient, with a history of anxiety managed with Xanax and Vyvanse, presents with urinary symptoms of frequency, urgency, and malodorous, cloudy urine. These symptoms have been ongoing for approximately six weeks. She initially attempted over-the-counter treatments, but when symptoms persisted, she sought medical attention. She is currently on day three of an antibiotic regimen. She reports some improvement, noting a decrease in urinary frequency.  The patient is also currently undergoing a stressful divorce process, which may be contributing to her anxiety levels. She has a grandchild from her only child, which was an unexpected development. The patient also mentions a high blood pressure issue, but no further details are provided in this encounter.       History of Present Illness   The patient, with a history of anxiety managed with Xanax and Vyvanse, presents with urinary symptoms of frequency, urgency, and malodorous, cloudy urine. These symptoms have been ongoing for approximately six weeks. She initially attempted over-the-counter treatments, but when symptoms persisted, she sought medical attention. She is currently on day three of an antibiotic regimen. She reports some improvement, noting a decrease in urinary frequency.  The patient is also currently undergoing a stressful divorce process, which may be contributing to her anxiety levels. She has a grandchild from her only child, which was an unexpected development. The patient also mentions a high blood pressure issue, but no further details are provided in this encounter.       Patient Active Problem List   Diagnosis Date Noted   Vitamin D deficiency 11/05/2022   BMI 24.0-24.9, adult 11/05/2022   Generalized obesity with starting BMI 30 05/20/2022   Diabetes mellitus (HCC) 09/13/2018   Other hyperlipidemia 09/13/2018   Other fatigue 04/29/2018   Shortness of breath on exertion 04/29/2018   Nonintractable migraine, unspecified migraine type 02/14/2018   Seasonal allergies 02/14/2018   Right foot pain 12/07/2017   Fibromyalgia 07/26/2015   Right foot sprain 10/10/2014   Acute pharyngitis 09/04/2014   ADD (attention deficit disorder) 09/04/2014   CTS (carpal tunnel syndrome) 09/23/2013   Dizzy 09/23/2013   Headache 09/23/2013   Left tennis elbow 10/19/2012   Depression 01/07/2010   HIP PAIN, LEFT 03/02/2009   SINUSITIS- ACUTE-NOS 08/24/2008   RHINITIS 07/12/2008   Attention deficit disorder 05/22/2008   Migraine without aura 03/26/2007   Eustachian tube disorder 03/26/2007   Past Medical History:  Diagnosis Date   ADD (attention deficit disorder)    Anxiety    Back pain    Chronic headaches    Depression    Fibromyalgia    GERD (gastroesophageal reflux disease)    Itchy skin    Joint pain    Lactose intolerance    Migraine    Sweating increase    Type 2 diabetes mellitus without complication, without long-term current use of insulin (HCC) 09/13/2018   Unspecified eustachian tube disorder    Past Surgical History:  Procedure Laterality Date   CESAREAN SECTION     OTHER SURGICAL HISTORY     preforated uterine surgery   SINUS EXPLORATION     Social History  Tobacco Use   Smoking status: Former    Current packs/day: 0.00    Types: Cigarettes    Start date: 05/15/1998    Quit date: 05/15/2006    Years since quitting: 17.0   Smokeless tobacco: Never   Tobacco comments:    1 cig every 4 months  Vaping Use   Vaping status: Never Used  Substance Use Topics   Alcohol use: Not Currently    Comment: occasionally   Drug use: No    Social History   Socioeconomic History   Marital status: Married    Spouse name: Programme researcher, broadcasting/film/video   Number of children: 1   Years of education: Not on file   Highest education level: Not on file  Occupational History   Occupation: middle Corporate investment banker: [employer]  Tobacco Use   Smoking status: Former    Current packs/day: 0.00    Types: Cigarettes    Start date: 05/15/1998    Quit date: 05/15/2006    Years since quitting: 17.0   Smokeless tobacco: Never   Tobacco comments:    1 cig every 4 months  Vaping Use   Vaping status: Never Used  Substance and Sexual Activity   Alcohol use: Not Currently    Comment: occasionally   Drug use: No   Sexual activity: Yes    Partners: Male  Other Topics Concern   Not on file  Social History Narrative   Exercise--  Yes --until she had stress fracture   Social Determinants of Health   Financial Resource Strain: Not on file  Food Insecurity: Not on file  Transportation Needs: Not on file  Physical Activity: Not on file  Stress: Not on file  Social Connections: Not on file  Intimate Partner Violence: Not on file   Family Status  Relation Name Status   Father  Alive   Mother  Alive   Sister  (Not Specified)  No partnership data on file   Family History  Problem Relation Age of Onset   Other Father        Dyslexia   Heart disease Father    Hypertension Father    Alcoholism Father    Hypertension Mother    Thyroid disease Mother    Anxiety disorder Mother    Sleep apnea Mother    Obesity Mother    ADD / ADHD Sister    Allergies  Allergen Reactions   Augmentin [Amoxicillin-Pot Clavulanate]     Severe yeast infections      Review of Systems  Constitutional:  Negative for chills, fever and malaise/fatigue.  HENT:  Negative for congestion and hearing loss.   Eyes:  Negative for blurred vision and discharge.  Respiratory:  Negative for cough, sputum production and shortness of breath.    Cardiovascular:  Negative for chest pain, palpitations and leg swelling.  Gastrointestinal:  Negative for abdominal pain, blood in stool, constipation, diarrhea, heartburn, nausea and vomiting.  Genitourinary:  Negative for dysuria, frequency, hematuria and urgency.  Musculoskeletal:  Negative for back pain, falls and myalgias.  Skin:  Negative for rash.  Neurological:  Negative for dizziness, sensory change, loss of consciousness, weakness and headaches.  Endo/Heme/Allergies:  Negative for environmental allergies. Does not bruise/bleed easily.  Psychiatric/Behavioral:  Negative for depression and suicidal ideas. The patient is not nervous/anxious and does not have insomnia.       Objective:     BP 118/76 (BP Location: Left Arm, Patient Position: Sitting, Cuff Size:  Normal)   Pulse 95   Temp 98.7 F (37.1 C) (Oral)   Resp 18   Ht 5\' 5"  (1.651 m)   Wt 160 lb 6.4 oz (72.8 kg)   LMP 09/12/2018   SpO2 97%   BMI 26.69 kg/m  BP Readings from Last 3 Encounters:  05/29/23 118/76  05/25/23 122/87  03/05/23 102/72   Wt Readings from Last 3 Encounters:  05/29/23 160 lb 6.4 oz (72.8 kg)  05/25/23 155 lb (70.3 kg)  03/05/23 152 lb (68.9 kg)   SpO2 Readings from Last 3 Encounters:  05/29/23 97%  05/25/23 95%  03/05/23 100%      Physical Exam Vitals and nursing note reviewed.  Constitutional:      General: She is not in acute distress.    Appearance: Normal appearance. She is well-developed.  HENT:     Head: Normocephalic and atraumatic.  Eyes:     General: No scleral icterus.       Right eye: No discharge.        Left eye: No discharge.  Cardiovascular:     Rate and Rhythm: Normal rate and regular rhythm.     Heart sounds: No murmur heard. Pulmonary:     Effort: Pulmonary effort is normal. No respiratory distress.     Breath sounds: Normal breath sounds.  Musculoskeletal:        General: Normal range of motion.     Cervical back: Normal range of motion and neck  supple.     Right lower leg: No edema.     Left lower leg: No edema.  Skin:    General: Skin is warm and dry.  Neurological:     Mental Status: She is alert and oriented to person, place, and time.  Psychiatric:        Mood and Affect: Mood normal.        Behavior: Behavior normal.        Thought Content: Thought content normal.        Judgment: Judgment normal.      No results found for any visits on 05/29/23.  Last CBC Lab Results  Component Value Date   WBC 6.8 04/29/2018   HGB 13.7 04/29/2018   HCT 42.3 04/29/2018   MCV 95 04/29/2018   MCH 30.9 04/29/2018   RDW 12.8 04/29/2018   PLT 305 10/23/2016   Last metabolic panel Lab Results  Component Value Date   GLUCOSE 160 (H) 05/25/2023   NA 143 05/25/2023   K 4.4 05/25/2023   CL 103 05/25/2023   CO2 20 05/25/2023   BUN 11 05/25/2023   CREATININE 0.80 05/25/2023   EGFR 90 05/25/2023   CALCIUM 9.8 05/25/2023   PROT 7.0 05/25/2023   ALBUMIN 4.7 05/25/2023   LABGLOB 2.3 05/25/2023   AGRATIO 2.1 10/30/2022   BILITOT 0.4 05/25/2023   ALKPHOS 118 05/25/2023   AST 17 05/25/2023   ALT 16 05/25/2023   ANIONGAP 12 10/23/2016   Last lipids Lab Results  Component Value Date   CHOL 191 10/30/2022   HDL 76 10/30/2022   LDLCALC 100 (H) 10/30/2022   TRIG 86 10/30/2022   CHOLHDL 2.8 02/18/2021   Last hemoglobin A1c Lab Results  Component Value Date   HGBA1C 6.4 (H) 05/25/2023   Last thyroid functions Lab Results  Component Value Date   TSH 1.060 10/30/2022   T3TOTAL 126 04/29/2018   Last vitamin D Lab Results  Component Value Date   VD25OH 63.5 05/25/2023  Last vitamin B12 and Folate Lab Results  Component Value Date   VITAMINB12 846 05/25/2023   FOLATE 13.4 04/29/2018      The 10-year ASCVD risk score (Arnett DK, et al., 2019) is: 1.3%    Assessment & Plan:   Problem List Items Addressed This Visit       Unprioritized   ADD (attention deficit disorder)   Relevant Medications    lisdexamfetamine (VYVANSE) 70 MG capsule   Other Visit Diagnoses     High risk medication use    -  Primary   Relevant Orders   Drug Monitoring Panel [phone] , Urine   Anxiety       Relevant Medications   ALPRAZolam (XANAX) 0.5 MG tablet   Bad odor of urine       Relevant Orders   Urine Culture     Assessment and Plan    Urinary Symptoms Reports of cloudy urine with an unusual smell, frequency, and urgency for the past six weeks. Currently on day three of an antibiotic course. -Check urine today and send for culture to ensure appropriate antibiotic treatment.  Medication Management Reports Vyvanse and Xanax are helping. -Continue current regimen of Vyvanse and Xanax.  Follow-up in six months or sooner if needed.        Return in about 6 months (around 11/27/2023), or if symptoms worsen or fail to improve.    Donato Schultz, DO

## 2023-05-29 NOTE — Assessment & Plan Note (Signed)
Stable  Con't vyvanse Uds/ contract updated  Database reviewed

## 2023-05-29 NOTE — Patient Instructions (Signed)
Living With Attention Deficit Hyperactivity Disorder If you have been diagnosed with attention deficit hyperactivity disorder (ADHD), you may be relieved that you now know why you have felt or behaved a certain way. Still, you may feel overwhelmed about the treatment ahead. You may also wonder how to get the support you need and how to deal with the condition day-to-day. With treatment and support, you can live with ADHD and manage your symptoms. How to manage lifestyle changes Managing lifestyle changes can be challenging. Seeking support from your healthcare provider, therapist, family, and friends can be helpful. How to recognize changes in your condition The following signs may mean that your treatment is working well and your condition is improving: Consistently being on time for appointments. Being more organized at home and work. Other people noticing improvements in your behavior. Achieving goals that you set for yourself. Thinking more clearly. The following signs may mean that your treatment is not working very well: Feeling impatience or more confusion. Missing, forgetting, or being late for appointments. An increasing sense of disorganization and messiness. More difficulty in reaching goals that you set for yourself. Loved ones becoming angry or frustrated with you. Follow these instructions at home: Medicines Take over-the-counter and prescription medicines only as told by your health care provider. Check with your health care provider before taking any new medicines. General instructions Create structure and an organized atmosphere at home. For example: Make a list of tasks, then rank them from most important to least important. Work on one task at a time until your listed tasks are done. Make a daily schedule and follow it consistently every day. Use an appointment calendar, and check it 2-3 times a day to keep on track. Keep it with you when you leave the house. Create  spaces where you keep certain things, and always put things back in their places after you use them. Keep all follow-up visits. Your health care provider will need to monitor your condition and adjust your treatment over time. Where to find support Talking to others  Keep emotion out of important discussions and speak in a calm, logical way. Listen closely and patiently to your loved ones. Try to understand their point of view, and try to avoid getting defensive. Take responsibility for the consequences of your actions. Ask that others do not take your behaviors personally. Aim to solve problems as they come up, and express your feelings instead of bottling them up. Talk openly about what you need from your loved ones and how they can support you. Consider going to family therapy sessions or having your family meet with a specialist who deals with ADHD-related behavior problems. Finances Not all insurance plans cover mental health care, so it is important to check with your insurance carrier. If paying for co-pays or counseling services is a problem, search for a local or county mental health care center. Public mental health care services may be offered there at a low cost or no cost when you are not able to see a private health care provider. If you are taking medicine for ADHD, you may be able to get the generic form, which may be less expensive than brand-name medicine. Some makers of prescription medicines also offer help to patients who cannot afford the medicines that they need. Therapy and support groups Talking with a mental health care provider and participating in support groups can help to improve your quality of life, daily functioning, and overall symptoms. Questions to ask your health   care provider: What are the risks and benefits of taking medicines? Would I benefit from therapy? How often should I follow up with a health care provider? Where to find more information Learn more  about ADHD from: Children and Adults with Attention Deficit Hyperactivity Disorder: chadd.org National Institute of Mental Health: nimh.nih.gov Centers for Disease Control and Prevention: cdc.gov Contact a health care provider if: You have side effects from your medicines, such as: Repeated muscle twitches, coughing, or speech outbursts. Sleep problems. Loss of appetite. Dizziness. Unusually fast heartbeat. Stomach pains. Headaches. You have new or worsening behavior problems. You are struggling with anxiety, depression, or substance abuse. Get help right away if: You have a severe reaction to a medicine. These symptoms may be an emergency. Get help right away. Call 911. Do not wait to see if the symptoms will go away. Do not drive yourself to the hospital. Take one of these steps if you feel like you may hurt yourself or others, or have thoughts about taking your own life: Go to your nearest emergency room. Call 911. Call the National Suicide Prevention Lifeline at 1-800-273-8255 or 988. This is open 24 hours a day. Text the Crisis Text Line at 741741. Summary With treatment and support, you can live with ADHD and manage your symptoms. Consider taking part in family therapy or self-help groups with family members or friends. When you talk with friends and family about your ADHD, be patient and communicate openly. Keep all follow-up visits. Your health care provider will need to monitor your condition and adjust your treatment over time. This information is not intended to replace advice given to you by your health care provider. Make sure you discuss any questions you have with your health care provider. Document Revised: 11/08/2021 Document Reviewed: 11/08/2021 Elsevier Patient Education  2024 Elsevier Inc.  

## 2023-05-31 LAB — DRUG MONITORING PANEL 376104, URINE
Amphetamine: 8448 ng/mL — ABNORMAL HIGH (ref <250)
Amphetamines: POSITIVE ng/mL — AB (ref <500)
Barbiturates: NEGATIVE ng/mL (ref <300)
Benzodiazepines: NEGATIVE ng/mL (ref <100)
Cocaine Metabolite: NEGATIVE ng/mL (ref <150)
Desmethyltramadol: NEGATIVE ng/mL (ref <100)
Methamphetamine: NEGATIVE ng/mL (ref <250)
Opiates: NEGATIVE ng/mL (ref <100)
Oxycodone: NEGATIVE ng/mL (ref <100)
Tramadol: NEGATIVE ng/mL (ref <100)

## 2023-05-31 LAB — URINE CULTURE
MICRO NUMBER:: 15644499
Result:: NO GROWTH
SPECIMEN QUALITY:: ADEQUATE

## 2023-05-31 LAB — DM TEMPLATE

## 2023-06-02 ENCOUNTER — Telehealth: Payer: Self-pay | Admitting: *Deleted

## 2023-06-02 MED ORDER — NITROFURANTOIN MONOHYD MACRO 100 MG PO CAPS: 100.0000 mg | ORAL_CAPSULE | Freq: Two times a day (BID) | ORAL | 0 refills | Status: AC

## 2023-06-02 NOTE — Addendum Note (Signed)
Addended by: Glennis Brink on: 06/02/2023 03:43 PM   Modules accepted: Orders

## 2023-06-02 NOTE — Telephone Encounter (Signed)
-----   Message from Glennis Brink sent at 06/02/2023  3:41 PM EDT ----- Your labs show good control over diabetes, signs of a UTI on urinalysis, improved vitamin D levels and an improved B12 level.  Insulin resistance is improving.  Stay on current supplements.  I am going to send an antibiotic to your pharmacy for pick up.  If you are not seeing improvements in the next 3 days, please go to your PCP or urgent care.

## 2023-06-02 NOTE — Telephone Encounter (Signed)
Left patient a message to call back about her Urinalysis results. Waiting to hear back from patient.

## 2023-06-11 ENCOUNTER — Encounter: Payer: Self-pay | Admitting: Family Medicine

## 2023-06-12 ENCOUNTER — Other Ambulatory Visit: Payer: Self-pay | Admitting: Family Medicine

## 2023-06-12 DIAGNOSIS — N3090 Cystitis, unspecified without hematuria: Secondary | ICD-10-CM

## 2023-06-12 MED ORDER — NITROFURANTOIN MONOHYD MACRO 100 MG PO CAPS: 100.0000 mg | ORAL_CAPSULE | Freq: Two times a day (BID) | ORAL | 0 refills | Status: DC

## 2023-06-30 ENCOUNTER — Encounter: Payer: Self-pay | Admitting: Family Medicine

## 2023-07-06 ENCOUNTER — Other Ambulatory Visit: Payer: Self-pay | Admitting: Family Medicine

## 2023-07-06 DIAGNOSIS — F909 Attention-deficit hyperactivity disorder, unspecified type: Secondary | ICD-10-CM

## 2023-07-06 MED ORDER — LISDEXAMFETAMINE DIMESYLATE 70 MG PO CAPS: 70.0000 mg | ORAL_CAPSULE | Freq: Every day | ORAL | 0 refills | Status: DC

## 2023-07-07 ENCOUNTER — Encounter: Payer: Self-pay | Admitting: Family Medicine

## 2023-07-07 ENCOUNTER — Ambulatory Visit: Payer: BC Managed Care – PPO | Admitting: Family Medicine

## 2023-07-07 VITALS — BP 109/75 | HR 85 | Temp 98.1°F | Ht 65.0 in | Wt 158.0 lb

## 2023-07-07 DIAGNOSIS — E119 Type 2 diabetes mellitus without complications: Secondary | ICD-10-CM | POA: Diagnosis not present

## 2023-07-07 DIAGNOSIS — E559 Vitamin D deficiency, unspecified: Secondary | ICD-10-CM

## 2023-07-07 DIAGNOSIS — Z7984 Long term (current) use of oral hypoglycemic drugs: Secondary | ICD-10-CM

## 2023-07-07 DIAGNOSIS — F3289 Other specified depressive episodes: Secondary | ICD-10-CM

## 2023-07-07 DIAGNOSIS — Z6826 Body mass index (BMI) 26.0-26.9, adult: Secondary | ICD-10-CM

## 2023-07-07 DIAGNOSIS — E669 Obesity, unspecified: Secondary | ICD-10-CM | POA: Diagnosis not present

## 2023-07-07 MED ORDER — TOPIRAMATE 50 MG PO TABS: 50.0000 mg | ORAL_TABLET | Freq: Every day | ORAL | 2 refills | Status: DC

## 2023-07-07 NOTE — Assessment & Plan Note (Signed)
Last vitamin D Lab Results  Component Value Date   VD25OH 63.5 05/25/2023   Reviewed labs from last visit.  Vitamin D level has improved on vitamin D 50,000 IU once weekly.  Her energy level has improved.  She may discontinue vitamin D 50,000 IU once weekly and switch over to over-the-counter vitamin D3 at 2000 IU once daily.  Recheck levels in 6 months

## 2023-07-07 NOTE — Assessment & Plan Note (Signed)
Lab Results  Component Value Date   HGBA1C 6.4 (H) 05/25/2023   Stable diabetic control.  Taking metformin 500 mg tab, 2 tabs twice daily with food without adverse side effect.  Has previously had adverse side effect from GLP-1 receptor agonist and SGLT2 inhibitors(bladder infection).  She is working on reducing her intake of added sugar and refined carbohydrates.  She has been more physically active and is starting to gain muscle mass.  Continue routine diabetic care through primary care.  Continue working on healthy lifestyle changes, regular exercise.

## 2023-07-07 NOTE — Progress Notes (Signed)
Office: [phone]  /  Fax: [phone]  WEIGHT SUMMARY AND BIOMETRICS  Starting Date: 04/29/18  Starting Weight: 184lb   Weight Lost Since Last Visit: 0lb   Vitals Temp: 98.1 F (36.7 C) BP: 109/75 Pulse Rate: 85 SpO2: 97 %   Body Composition  Body Fat %: 33.6 % Fat Mass (lbs): 53.2 lbs Muscle Mass (lbs): 99.8 lbs Total Body Water (lbs): 65.8 lbs Visceral Fat Rating : 7   HPI  Chief Complaint: OBESITY  Jillian Green is here to discuss her progress with her obesity treatment plan. She is on the the Category 3 Plan and states she is following her eating plan approximately 50 % of the time. She states she is exercising 45 minutes 3-4 times per week.  Interval History:  Since last office visit she is up 3 lb She is up 1.8 lb of muscle mass and body fat is up 1 lb This gives her a net weight loss of 26 lb in the past 5 years She is coaching basketball for exercise Stress levels are high She is getting in all of her meals without skipping Sweets and treats have been at work, harder to avoid  Pharmacotherapy: topiramate 50 mg daily, metformin 1000 mg bid  PHYSICAL EXAM:  Blood pressure 109/75, pulse 85, temperature 98.1 F (36.7 C), height 5\' 5"  (1.651 m), weight 158 lb (71.7 kg), last menstrual period 09/12/2018, SpO2 97%. Body mass index is 26.29 kg/m.  General: She is healthy appearing, cooperative, alert, well developed, and in no acute distress. PSYCH: Has normal mood, affect and thought process.   Lungs: Normal breathing effort, no conversational dyspnea.   ASSESSMENT AND PLAN  TREATMENT PLAN FOR OBESITY:  Recommended Dietary Goals  [name] is currently in the action stage of change. As such, her goal is to continue weight management plan. She has agreed to practicing portion control and making smarter food choices, such as increasing vegetables and decreasing simple carbohydrates. - reviewed options for breakfast, afternoon snacks and smaller easy dinner  for later at night after basketball  Behavioral Intervention  We discussed the following Behavioral Modification Strategies today: increasing lean protein intake to established goals, increasing vegetables, increasing lower glycemic fruits, increasing fiber rich foods, avoiding skipping meals, increasing water intake , work on meal planning and preparation, keeping healthy foods at home, identifying sources and decreasing liquid calories, planning for success, better snacking choices, and continue to work on maintaining a reduced calorie state, getting the recommended amount of protein, incorporating whole foods, making healthy choices, staying well hydrated and practicing mindfulness when eating..  Additional resources provided today: NA  Recommended Physical Activity Goals  Clarann has been advised to work up to 150 minutes of moderate intensity aerobic activity a week and strengthening exercises 2-3 times per week for cardiovascular health, weight loss maintenance and preservation of muscle mass.   She has agreed to Work on scheduling and tracking physical activity.   Pharmacotherapy changes for the treatment of obesity: none  ASSOCIATED CONDITIONS ADDRESSED TODAY  Type 2 diabetes mellitus without complication, without long-term current use of insulin (HCC) Assessment & Plan: Lab Results  Component Value Date   HGBA1C 6.4 (H) 05/25/2023   Stable diabetic control.  Taking metformin 500 mg tab, 2 tabs twice daily with food without adverse side effect.  Has previously had adverse side effect from GLP-1 receptor agonist and SGLT2 inhibitors(bladder infection).  She is working on reducing her intake of added sugar and refined carbohydrates.  She has been more  physically active and is starting to gain muscle mass.  Continue routine diabetic care through primary care.  Continue working on healthy lifestyle changes, regular exercise.   Emotional Eating Behavior Assessment & Plan: With  emotional eating.  Stress levels remain high but she has a good support system.  She is tempted by sweets at times.  She remains on duloxetine 60 mg twice daily, Wellbutrin XL 300 mg once daily.  Food impulse control has improved on topiramate 50 mg once daily without adverse side effect.  Continue to work on self-care, stress reduction and continue current medications. Keep junk food snacks out of sight and stock up on healthy snacks for work.  Orders: -     Topiramate; Take 1 tablet (50 mg total) by mouth daily.  Dispense: 30 tablet; Refill: 2  Generalized obesity with starting BMI 30  BMI 26.0-26.9,adult  Vitamin D deficiency Assessment & Plan: Last vitamin D Lab Results  Component Value Date   VD25OH 63.5 05/25/2023   Reviewed labs from last visit.  Vitamin D level has improved on vitamin D 50,000 IU once weekly.  Her energy level has improved.  She may discontinue vitamin D 50,000 IU once weekly and switch over to over-the-counter vitamin D3 at 2000 IU once daily.  Recheck levels in 6 months       She was informed of the importance of frequent follow up visits to maximize her success with intensive lifestyle modifications for her multiple health conditions.   ATTESTASTION STATEMENTS:  Reviewed by clinician on day of visit: allergies, medications, problem list, medical history, surgical history, family history, social history, and previous encounter notes pertinent to obesity diagnosis.   I have personally spent 30 minutes total time today in preparation, patient care, nutritional counseling and documentation for this visit, including the following: review of clinical lab tests; review of medical tests/procedures/services.      Glennis Brink, DO DABFM, DABOM [facility] [address] [place], [place] [address] [phone]

## 2023-07-07 NOTE — Assessment & Plan Note (Signed)
With emotional eating.  Stress levels remain high but she has a good support system.  She is tempted by sweets at times.  She remains on duloxetine 60 mg twice daily, Wellbutrin XL 300 mg once daily.  Food impulse control has improved on topiramate 50 mg once daily without adverse side effect.  Continue to work on self-care, stress reduction and continue current medications. Keep junk food snacks out of sight and stock up on healthy snacks for work.

## 2023-08-06 ENCOUNTER — Other Ambulatory Visit: Payer: Self-pay | Admitting: Family Medicine

## 2023-08-06 DIAGNOSIS — F3289 Other specified depressive episodes: Secondary | ICD-10-CM

## 2023-09-01 ENCOUNTER — Other Ambulatory Visit: Payer: Self-pay | Admitting: Family Medicine

## 2023-09-01 DIAGNOSIS — F909 Attention-deficit hyperactivity disorder, unspecified type: Secondary | ICD-10-CM

## 2023-09-01 MED ORDER — LISDEXAMFETAMINE DIMESYLATE 70 MG PO CAPS: 70.0000 mg | ORAL_CAPSULE | Freq: Every day | ORAL | 0 refills | Status: DC

## 2023-09-01 NOTE — Telephone Encounter (Signed)
Requesting: Vyvanse 70mg   Contract: 05/29/23 UDS: 05/29/23 Last Visit: 05/29/23 Next Visit: None Last Refill: 07/06/23 #30 and 0RF   Please Advise

## 2023-09-08 ENCOUNTER — Ambulatory Visit: Payer: BC Managed Care – PPO | Admitting: Family Medicine

## 2023-09-17 ENCOUNTER — Ambulatory Visit: Payer: BC Managed Care – PPO | Admitting: Family Medicine

## 2023-11-02 ENCOUNTER — Other Ambulatory Visit: Payer: Self-pay | Admitting: Family Medicine

## 2023-11-02 DIAGNOSIS — F909 Attention-deficit hyperactivity disorder, unspecified type: Secondary | ICD-10-CM

## 2023-11-02 NOTE — Telephone Encounter (Signed)
 Requesting: vyvanse Contract:05/28/23 UDS:05/28/23 Last Visit:07/07/23 Next Visit:11/05/23 Last Refill:09/01/23  Please Advise

## 2023-11-03 MED ORDER — LISDEXAMFETAMINE DIMESYLATE 70 MG PO CAPS: 70.0000 mg | ORAL_CAPSULE | Freq: Every day | ORAL | 0 refills | Status: DC

## 2023-11-05 ENCOUNTER — Encounter: Payer: Self-pay | Admitting: Family Medicine

## 2023-11-05 ENCOUNTER — Ambulatory Visit (INDEPENDENT_AMBULATORY_CARE_PROVIDER_SITE_OTHER): Admitting: Family Medicine

## 2023-11-05 VITALS — BP 122/86 | HR 100 | Temp 98.6°F | Resp 16 | Ht 65.0 in | Wt 167.6 lb

## 2023-11-05 DIAGNOSIS — Z23 Encounter for immunization: Secondary | ICD-10-CM

## 2023-11-05 DIAGNOSIS — M797 Fibromyalgia: Secondary | ICD-10-CM

## 2023-11-05 DIAGNOSIS — F909 Attention-deficit hyperactivity disorder, unspecified type: Secondary | ICD-10-CM

## 2023-11-05 DIAGNOSIS — Z0000 Encounter for general adult medical examination without abnormal findings: Secondary | ICD-10-CM | POA: Diagnosis not present

## 2023-11-05 DIAGNOSIS — F419 Anxiety disorder, unspecified: Secondary | ICD-10-CM | POA: Diagnosis not present

## 2023-11-05 DIAGNOSIS — Z789 Other specified health status: Secondary | ICD-10-CM

## 2023-11-05 DIAGNOSIS — N898 Other specified noninflammatory disorders of vagina: Secondary | ICD-10-CM

## 2023-11-05 MED ORDER — DULOXETINE HCL 60 MG PO CPEP: 60.0000 mg | ORAL_CAPSULE | Freq: Two times a day (BID) | ORAL | 1 refills | Status: DC

## 2023-11-05 MED ORDER — ALPRAZOLAM 0.5 MG PO TABS: 0.5000 mg | ORAL_TABLET | Freq: Three times a day (TID) | ORAL | 1 refills | Status: DC | PRN

## 2023-11-05 NOTE — Assessment & Plan Note (Signed)
 Stable  Uds utd Contract uds Database reviewed

## 2023-11-05 NOTE — Patient Instructions (Signed)
 Preventive Care 16-52 Years Old, Female  Preventive care refers to lifestyle choices and visits with your health care provider that can promote health and wellness. Preventive care visits are also called wellness exams.  What can I expect for my preventive care visit?  Counseling  Your health care provider may ask you questions about your:  Medical history, including:  Past medical problems.  Family medical history.  Pregnancy history.  Current health, including:  Menstrual cycle.  Method of birth control.  Emotional well-being.  Home life and relationship well-being.  Sexual activity and sexual health.  Lifestyle, including:  Alcohol, nicotine or tobacco, and drug use.  Access to firearms.  Diet, exercise, and sleep habits.  Work and work Astronomer.  Sunscreen use.  Safety issues such as seatbelt and bike helmet use.  Physical exam  Your health care provider will check your:  Height and weight. These may be used to calculate your BMI (body mass index). BMI is a measurement that tells if you are at a healthy weight.  Waist circumference. This measures the distance around your waistline. This measurement also tells if you are at a healthy weight and may help predict your risk of certain diseases, such as type 2 diabetes and high blood pressure.  Heart rate and blood pressure.  Body temperature.  Skin for abnormal spots.  What immunizations do I need?    Vaccines are usually given at various ages, according to a schedule. Your health care provider will recommend vaccines for you based on your age, medical history, and lifestyle or other factors, such as travel or where you work.  What tests do I need?  Screening  Your health care provider may recommend screening tests for certain conditions. This may include:  Lipid and cholesterol levels.  Diabetes screening. This is done by checking your blood sugar (glucose) after you have not eaten for a while (fasting).  Pelvic exam and Pap test.  Hepatitis B test.  Hepatitis C  test.  HIV (human immunodeficiency virus) test.  STI (sexually transmitted infection) testing, if you are at risk.  Lung cancer screening.  Colorectal cancer screening.  Mammogram. Talk with your health care provider about when you should start having regular mammograms. This may depend on whether you have a family history of breast cancer.  BRCA-related cancer screening. This may be done if you have a family history of breast, ovarian, tubal, or peritoneal cancers.  Bone density scan. This is done to screen for osteoporosis.  Talk with your health care provider about your test results, treatment options, and if necessary, the need for more tests.  Follow these instructions at home:  Eating and drinking    Eat a diet that includes fresh fruits and vegetables, whole grains, lean protein, and low-fat dairy products.  Take vitamin and mineral supplements as recommended by your health care provider.  Do not drink alcohol if:  Your health care provider tells you not to drink.  You are pregnant, may be pregnant, or are planning to become pregnant.  If you drink alcohol:  Limit how much you have to 0-1 drink a day.  Know how much alcohol is in your drink. In the [place] one drink equals one 12 oz bottle of beer (355 mL), one 5 oz glass of wine (148 mL), or one 1 oz glass of hard liquor (44 mL).  Lifestyle  Brush your teeth every morning and night with fluoride toothpaste. Floss one time each day.  Exercise for at least  30 minutes 5 or more days each week.  Do not use any products that contain nicotine or tobacco. These products include cigarettes, chewing tobacco, and vaping devices, such as e-cigarettes. If you need help quitting, ask your health care provider.  Do not use drugs.  If you are sexually active, practice safe sex. Use a condom or other form of protection to prevent STIs.  If you do not wish to become pregnant, use a form of birth control. If you plan to become pregnant, see your health care provider for a  prepregnancy visit.  Take aspirin only as told by your health care provider. Make sure that you understand how much to take and what form to take. Work with your health care provider to find out whether it is safe and beneficial for you to take aspirin daily.  Find healthy ways to manage stress, such as:  Meditation, yoga, or listening to music.  Journaling.  Talking to a trusted person.  Spending time with friends and family.  Minimize exposure to UV radiation to reduce your risk of skin cancer.  Safety  Always wear your seat belt while driving or riding in a vehicle.  Do not drive:  If you have been drinking alcohol. Do not ride with someone who has been drinking.  When you are tired or distracted.  While texting.  If you have been using any mind-altering substances or drugs.  Wear a helmet and other protective equipment during sports activities.  If you have firearms in your house, make sure you follow all gun safety procedures.  Seek help if you have been physically or sexually abused.  What's next?  Visit your health care provider once a year for an annual wellness visit.  Ask your health care provider how often you should have your eyes and teeth checked.  Stay up to date on all vaccines.  This information is not intended to replace advice given to you by your health care provider. Make sure you discuss any questions you have with your health care provider.  Document Revised: 01/16/2021 Document Reviewed: 01/16/2021  Elsevier Patient Education  2024 [employer].

## 2023-11-05 NOTE — Progress Notes (Signed)
 Established Patient Office Visit  Subjective   Patient ID: Jillian Green, female    DOB: 1971/04/26  Age: 53 y.o. MRN: [id number]  Chief Complaint  Patient presents with   Health Form    HPI Discussed the use of AI scribe software for clinical note transcription with the patient, who gave verbal consent to proceed.  History of Present Illness Jillian Green is a 53 year old female who presents with blisters in the genital area.  She developed blisters in the genital area approximately two days ago, which she describes as appearing in 'very odd places.' These blisters have been healing rapidly and initially caused pain, but she is no longer painful. There is no associated discharge or odor. She has no history of herpes and was initially concerned about the possibility of it.  Her vaccinations are mostly up to date, with a booster for hepatitis B received in 2013. She is due for a tetanus booster. She also had a TB test done, which she refers to as 'the little dot thingies.'  She recently switched jobs and is considering seeing a gynecologist for further evaluation of her symptoms. She also wants to see a counselor or therapist, noting that many are currently backed up for months.   Patient Active Problem List   Diagnosis Date Noted   Vitamin D deficiency 11/05/2022   BMI 24.0-24.9, adult 11/05/2022   Generalized obesity with starting BMI 30 05/20/2022   Diabetes mellitus (HCC) 09/13/2018   Other hyperlipidemia 09/13/2018   Other fatigue 04/29/2018   Shortness of breath on exertion 04/29/2018   Nonintractable migraine, unspecified migraine type 02/14/2018   Seasonal allergies 02/14/2018   Right foot pain 12/07/2017   Fibromyalgia 07/26/2015   Right foot sprain 10/10/2014   Acute pharyngitis 09/04/2014   ADD (attention deficit disorder) 09/04/2014   CTS (carpal tunnel syndrome) 09/23/2013   Dizzy 09/23/2013   Headache 09/23/2013   Left tennis elbow 10/19/2012    Depression 01/07/2010   HIP PAIN, LEFT 03/02/2009   SINUSITIS- ACUTE-NOS 08/24/2008   RHINITIS 07/12/2008   Attention deficit disorder 05/22/2008   Migraine without aura 03/26/2007   Eustachian tube disorder 03/26/2007   Past Medical History:  Diagnosis Date   ADD (attention deficit disorder)    Anxiety    Back pain    Chronic headaches    Depression    Fibromyalgia    GERD (gastroesophageal reflux disease)    Itchy skin    Joint pain    Lactose intolerance    Migraine    Sweating increase    Type 2 diabetes mellitus without complication, without long-term current use of insulin (HCC) 09/13/2018   Unspecified eustachian tube disorder    Past Surgical History:  Procedure Laterality Date   CESAREAN SECTION     OTHER SURGICAL HISTORY     preforated uterine surgery   SINUS EXPLORATION     Social History   Tobacco Use   Smoking status: Former    Current packs/day: 0.00    Types: Cigarettes    Start date: 05/15/1998    Quit date: 05/15/2006    Years since quitting: 17.4   Smokeless tobacco: Never   Tobacco comments:    1 cig every 4 months  Vaping Use   Vaping status: Never Used  Substance Use Topics   Alcohol use: Not Currently    Comment: occasionally   Drug use: No   Social History   Socioeconomic History   Marital status: Married  Spouse name: chris greenwalt   Number of children: 1   Years of education: Not on file   Highest education level: Not on file  Occupational History   Occupation: [employer]    Employer: [employer]  Tobacco Use   Smoking status: Former    Current packs/day: 0.00    Types: Cigarettes    Start date: 05/15/1998    Quit date: 05/15/2006    Years since quitting: 17.4   Smokeless tobacco: Never   Tobacco comments:    1 cig every 4 months  Vaping Use   Vaping status: Never Used  Substance and Sexual Activity   Alcohol use: Not Currently    Comment: occasionally   Drug use: No   Sexual activity: Yes     Partners: Male  Other Topics Concern   Not on file  Social History Narrative   Exercise--  Yes   Social Drivers of Health   Financial Resource Strain: Not on file  Food Insecurity: Not on file  Transportation Needs: Not on file  Physical Activity: Not on file  Stress: Not on file  Social Connections: Not on file  Intimate Partner Violence: Not on file   Family Status  Relation Name Status   Father  Alive   Mother  Alive   Sister  (Not Specified)  No partnership data on file   Family History  Problem Relation Age of Onset   Other Father        Dyslexia   Heart disease Father    Hypertension Father    Alcoholism Father    Hypertension Mother    Thyroid disease Mother    Anxiety disorder Mother    Sleep apnea Mother    Obesity Mother    ADD / ADHD Sister    Allergies  Allergen Reactions   Augmentin [Amoxicillin-Pot Clavulanate]     Severe yeast infections   Other Nausea And Vomiting    Rybelsus Pt described severe vomiting and GI issues.      Review of Systems  Constitutional:  Negative for fever and malaise/fatigue.  HENT:  Negative for congestion.   Eyes:  Negative for blurred vision.  Respiratory:  Negative for cough and shortness of breath.   Cardiovascular:  Negative for chest pain, palpitations and leg swelling.  Gastrointestinal:  Negative for vomiting.  Musculoskeletal:  Negative for back pain.  Skin:  Negative for rash.  Neurological:  Negative for loss of consciousness and headaches.      Objective:     BP 122/86 (BP Location: Left Arm, Patient Position: Sitting, Cuff Size: Normal)   Pulse 100   Temp 98.6 F (37 C) (Oral)   Resp 16   Ht 5\' 5"  (1.651 m)   Wt 167 lb 9.6 oz (76 kg)   LMP 09/12/2018   SpO2 97%   BMI 27.89 kg/m  BP Readings from Last 3 Encounters:  11/05/23 122/86  07/07/23 109/75  05/29/23 118/76   Wt Readings from Last 3 Encounters:  11/05/23 167 lb 9.6 oz (76 kg)  07/07/23 158 lb (71.7 kg)  05/29/23 160 lb 6.4 oz  (72.8 kg)   SpO2 Readings from Last 3 Encounters:  11/05/23 97%  07/07/23 97%  05/29/23 97%      Physical Exam Vitals and nursing note reviewed.  Constitutional:      General: She is not in acute distress.    Appearance: Normal appearance. She is well-developed.  HENT:     Head: Normocephalic and  atraumatic.  Eyes:     General: No scleral icterus.       Right eye: No discharge.        Left eye: No discharge.  Cardiovascular:     Rate and Rhythm: Normal rate and regular rhythm.     Heart sounds: No murmur heard. Pulmonary:     Effort: Pulmonary effort is normal. No respiratory distress.     Breath sounds: Normal breath sounds.  Musculoskeletal:        General: Normal range of motion.     Cervical back: Normal range of motion and neck supple.     Right lower leg: No edema.     Left lower leg: No edema.  Skin:    General: Skin is warm and dry.  Neurological:     Mental Status: She is alert and oriented to person, place, and time.  Psychiatric:        Mood and Affect: Mood normal.        Behavior: Behavior normal.        Thought Content: Thought content normal.        Judgment: Judgment normal.      No results found for any visits on 11/05/23.  Last CBC Lab Results  Component Value Date   WBC 6.8 04/29/2018   HGB 13.7 04/29/2018   HCT 42.3 04/29/2018   MCV 95 04/29/2018   MCH 30.9 04/29/2018   RDW 12.8 04/29/2018   PLT 305 10/23/2016   Last metabolic panel Lab Results  Component Value Date   GLUCOSE 160 (H) 05/25/2023   NA 143 05/25/2023   K 4.4 05/25/2023   CL 103 05/25/2023   CO2 20 05/25/2023   BUN 11 05/25/2023   CREATININE 0.80 05/25/2023   EGFR 90 05/25/2023   CALCIUM 9.8 05/25/2023   PROT 7.0 05/25/2023   ALBUMIN 4.7 05/25/2023   LABGLOB 2.3 05/25/2023   AGRATIO 2.1 10/30/2022   BILITOT 0.4 05/25/2023   ALKPHOS 118 05/25/2023   AST 17 05/25/2023   ALT 16 05/25/2023   ANIONGAP 12 10/23/2016   Last lipids Lab Results  Component Value  Date   CHOL 191 10/30/2022   HDL 76 10/30/2022   LDLCALC 100 (H) 10/30/2022   TRIG 86 10/30/2022   CHOLHDL 2.8 02/18/2021   Last hemoglobin A1c Lab Results  Component Value Date   HGBA1C 6.4 (H) 05/25/2023   Last thyroid functions Lab Results  Component Value Date   TSH 1.060 10/30/2022   T3TOTAL 126 04/29/2018   Last vitamin D Lab Results  Component Value Date   VD25OH 63.5 05/25/2023   Last vitamin B12 and Folate Lab Results  Component Value Date   VITAMINB12 846 05/25/2023   FOLATE 13.4 04/29/2018      The 10-year ASCVD risk score (Arnett DK, et al., 2019) is: 1.5%    Assessment & Plan:   Problem List Items Addressed This Visit       Unprioritized   Fibromyalgia   Relevant Medications   DULoxetine (CYMBALTA) 60 MG capsule   Other Visit Diagnoses       Preventative health care    -  Primary   Relevant Orders   CBC with Differential/Platelet   Comprehensive metabolic panel with GFR   Lipid panel   TSH     Anxiety       Relevant Medications   ALPRAZolam (XANAX) 0.5 MG tablet   DULoxetine (CYMBALTA) 60 MG capsule     Need for Tdap vaccination  Relevant Orders   Tdap vaccine greater than or equal to 7yo IM (Completed)     Vaginal sore       Relevant Orders   HSV(herpes simplex vrs) 1+2 ab-IgG     Measles, mumps, rubella (MMR) vaccination status unknown       Relevant Orders   Measles/Mumps/Rubella Immunity     Hepatitis B vaccination status unknown       Relevant Orders   Hepatitis B surface antibody,quantitative     Assessment and Plan Assessment & Plan Genital Blisters   She reports genital blisters that appeared two days ago, healing rapidly, with no discharge or odor. There is no history of herpes. The differential diagnosis includes herpes simplex virus infection, though rapid healing is atypical. She consented to a blood test for herpes simplex virus to confirm the diagnosis. Order the blood test.  Mental Health Counseling   She  expresses a need for mental health counseling and inquires about therapist recommendations. She acknowledges long wait times but is interested in virtual options. Provide recommendations for local therapists and inform her about virtual therapy options.  General Health Maintenance   She is due for a tetanus booster. Her vaccination history includes a hepatitis B booster in 2013 and a TB test. There has been no recent gynecological examination, but she acknowledges the need to see a gynecologist. Administer the tetanus booster and encourage her to schedule a gynecological examination.    No follow-ups on file.    Donato Schultz, DO

## 2023-11-06 LAB — COMPREHENSIVE METABOLIC PANEL WITH GFR
ALT: 19 U/L (ref 0–35)
AST: 13 U/L (ref 0–37)
Albumin: 4.7 g/dL (ref 3.5–5.2)
Alkaline Phosphatase: 107 U/L (ref 39–117)
BUN: 15 mg/dL (ref 6–23)
CO2: 24 meq/L (ref 19–32)
Calcium: 9.1 mg/dL (ref 8.4–10.5)
Chloride: 101 meq/L (ref 96–112)
Creatinine, Ser: 0.78 mg/dL (ref 0.40–1.20)
GFR: 87.97 mL/min (ref >60.00)
Glucose, Bld: 152 mg/dL — ABNORMAL HIGH (ref 70–99)
Potassium: 4.1 meq/L (ref 3.5–5.1)
Sodium: 136 meq/L (ref 135–145)
Total Bilirubin: 0.3 mg/dL (ref 0.2–1.2)
Total Protein: 7.1 g/dL (ref 6.0–8.3)

## 2023-11-06 LAB — MEASLES/MUMPS/RUBELLA IMMUNITY
Mumps IgG: 32.9 [AU]/ml
Rubella: 9.82 {index}
Rubeola IgG: 60.6 [AU]/ml

## 2023-11-06 LAB — LIPID PANEL
Cholesterol: 200 mg/dL (ref 0–200)
HDL: 74.7 mg/dL (ref >39.00)
LDL Cholesterol: 93 mg/dL (ref 0–99)
NonHDL: 125.6
Total CHOL/HDL Ratio: 3
Triglycerides: 162 mg/dL — ABNORMAL HIGH (ref 0.0–149.0)
VLDL: 32.4 mg/dL (ref 0.0–40.0)

## 2023-11-06 LAB — CBC WITH DIFFERENTIAL/PLATELET
Basophils Absolute: 0.1 10*3/uL (ref 0.0–0.1)
Basophils Relative: 1.4 % (ref 0.0–3.0)
Eosinophils Absolute: 0.4 10*3/uL (ref 0.0–0.7)
Eosinophils Relative: 5.6 % — ABNORMAL HIGH (ref 0.0–5.0)
HCT: 42.4 % (ref 36.0–46.0)
Hemoglobin: 14.2 g/dL (ref 12.0–15.0)
Lymphocytes Relative: 26.8 % (ref 12.0–46.0)
Lymphs Abs: 1.8 10*3/uL (ref 0.7–4.0)
MCHC: 33.4 g/dL (ref 30.0–36.0)
MCV: 95.3 fl (ref 78.0–100.0)
Monocytes Absolute: 0.3 10*3/uL (ref 0.1–1.0)
Monocytes Relative: 5 % (ref 3.0–12.0)
Neutro Abs: 4.1 10*3/uL (ref 1.4–7.7)
Neutrophils Relative %: 61.2 % (ref 43.0–77.0)
Platelets: 326 10*3/uL (ref 150.0–400.0)
RBC: 4.45 Mil/uL (ref 3.87–5.11)
RDW: 12.5 % (ref 11.5–15.5)
WBC: 6.7 10*3/uL (ref 4.0–10.5)

## 2023-11-06 LAB — HSV(HERPES SIMPLEX VRS) I + II AB-IGG
HSV 1 IGG,TYPE SPECIFIC AB: 30 {index} — ABNORMAL HIGH
HSV 2 IGG,TYPE SPECIFIC AB: 20.2 {index} — ABNORMAL HIGH

## 2023-11-06 LAB — TSH: TSH: 0.78 u[IU]/mL (ref 0.35–5.50)

## 2023-11-06 LAB — HEPATITIS B SURFACE ANTIBODY, QUANTITATIVE: Hep B S AB Quant (Post): <5 m[IU]/mL — ABNORMAL LOW (ref >=10)

## 2023-11-15 ENCOUNTER — Encounter: Payer: Self-pay | Admitting: Family Medicine

## 2023-11-16 MED ORDER — VALACYCLOVIR HCL 1 G PO TABS: 1000.0000 mg | ORAL_TABLET | Freq: Two times a day (BID) | ORAL | 2 refills | Status: AC

## 2023-11-23 ENCOUNTER — Ambulatory Visit (INDEPENDENT_AMBULATORY_CARE_PROVIDER_SITE_OTHER): Admitting: Family Medicine

## 2023-11-23 ENCOUNTER — Encounter: Payer: Self-pay | Admitting: Family Medicine

## 2023-11-23 VITALS — BP 111/70 | HR 101 | Temp 98.0°F | Ht 65.0 in | Wt 165.0 lb

## 2023-11-23 DIAGNOSIS — F3289 Other specified depressive episodes: Secondary | ICD-10-CM

## 2023-11-23 DIAGNOSIS — F5089 Other specified eating disorder: Secondary | ICD-10-CM | POA: Diagnosis not present

## 2023-11-23 DIAGNOSIS — Z638 Other specified problems related to primary support group: Secondary | ICD-10-CM | POA: Diagnosis not present

## 2023-11-23 DIAGNOSIS — Z6827 Body mass index (BMI) 27.0-27.9, adult: Secondary | ICD-10-CM

## 2023-11-23 DIAGNOSIS — E1169 Type 2 diabetes mellitus with other specified complication: Secondary | ICD-10-CM

## 2023-11-23 DIAGNOSIS — E669 Obesity, unspecified: Secondary | ICD-10-CM | POA: Diagnosis not present

## 2023-11-23 DIAGNOSIS — R110 Nausea: Secondary | ICD-10-CM

## 2023-11-23 DIAGNOSIS — Z7984 Long term (current) use of oral hypoglycemic drugs: Secondary | ICD-10-CM

## 2023-11-23 MED ORDER — TOPIRAMATE 50 MG PO TABS: 50.0000 mg | ORAL_TABLET | Freq: Every day | ORAL | 2 refills | Status: DC

## 2023-11-23 MED ORDER — METFORMIN HCL 500 MG PO TABS: 1000.0000 mg | ORAL_TABLET | Freq: Two times a day (BID) | ORAL | 0 refills | Status: DC

## 2023-11-23 NOTE — Progress Notes (Signed)
 Office: [phone]  /  Fax: [phone]  WEIGHT SUMMARY AND BIOMETRICS  Starting Date: 04/29/18  Starting Weight: 184 lb   Weight Lost Since Last Visit: 0   Vitals Temp: 98 F (36.7 C) BP: 111/70 Pulse Rate: (!) 101 SpO2: 98 %   Body Composition  Body Fat %: 35.8 % Fat Mass (lbs): 59.2 lbs Muscle Mass (lbs): 101 lbs Total Body Water (lbs): 69.2 lbs Visceral Fat Rating : 8    HPI  Chief Complaint: OBESITY  Jillian Green is here to discuss her progress with her obesity treatment plan. She is on the the Category 3 Plan and states she is following her eating plan approximately 30 % of the time. She states she is not exercising much.  Interval History:  Since last office visit she is up 7 lb This gives her a net weight loss of 19 lb in 4+ years She has been thru a lot of stressors the past few month She lost her job in Dec and has recently gone thru a divorce Her mom fell last week with facial fractures She found a new job and started-- this has helped to give her structure Her daughter has been fighting with her and keeping her from seeing her grandbaby She is more prone to meal skipping She has been doing some outside walking She has stayed off SSBs Blood sugars are running higher  Pharmacotherapy: metformin  1000 mg bid + Topiramate  50 mg daily  PHYSICAL EXAM:  Blood pressure 111/70, pulse (!) 101, temperature 98 F (36.7 C), height 5\' 5"  (1.651 m), weight 165 lb (74.8 kg), last menstrual period 09/12/2018, SpO2 98%. Body mass index is 27.46 kg/m.  General: She is healthy appearing, cooperative, alert, well developed, and in no acute distress. PSYCH: Has normal mood, affect and thought process.   Lungs: Normal breathing effort, no conversational dyspnea.   ASSESSMENT AND PLAN  TREATMENT PLAN FOR OBESITY:  Recommended Dietary Goals  Jillian Green is currently in the action stage of change. As such, her goal is to continue weight management plan. She has agreed to  keeping a food journal and adhering to recommended goals of 1500 calories and 85 g + of  protein and practicing portion control and making smarter food choices, such as increasing vegetables and decreasing simple carbohydrates.  Behavioral Intervention  We discussed the following Behavioral Modification Strategies today: increasing lean protein intake to established goals, increasing fiber rich foods, increasing water intake , work on meal planning and preparation, work on Counselling psychologist calories using tracking application, keeping healthy foods at home, practice mindfulness eating and understand the difference between hunger signals and cravings, work on managing stress, creating time for self-care and relaxation, avoiding temptations and identifying enticing environmental cues, and continue to work on maintaining a reduced calorie state, getting the recommended amount of protein, incorporating whole foods, making healthy choices, staying well hydrated and practicing mindfulness when eating..  Additional resources provided today: NA  Recommended Physical Activity Goals  Jillian Green has been advised to work up to 150 minutes of moderate intensity aerobic activity a week and strengthening exercises 2-3 times per week for cardiovascular health, weight loss maintenance and preservation of muscle mass.   She has agreed to Start aerobic activity with a goal of 150 minutes a week at moderate intensity.  Walking goal reviewed on AVS  Pharmacotherapy changes for the treatment of obesity: none  ASSOCIATED CONDITIONS ADDRESSED TODAY  Type 2 diabetes mellitus with other specified complication, without long-term current use  of insulin  (HCC) Due for A1c today AM glucose readings (fastings 170- low 200s) 2 hr PP's 140's and higher Taking metformin  1000 mg bid, tolerating well Intolerant to multiple GLP1 RA's and SGLT2 inhibitors  Work on stress reduction, sleep, walking and proper nutrition Consider  getting a CGM -     Hemoglobin A1c -     metFORMIN  HCl; Take 2 tablets (1,000 mg total) by mouth 2 (two) times daily.  Dispense: 120 tablet; Refill: 0  Emotional Eating Behavior Worsening with some improvements on topiramate , tolerating well Family stress high Outside counseling may be helpful -     Topiramate ; Take 1 tablet (50 mg total) by mouth daily.  Dispense: 30 tablet; Refill: 2  Generalized obesity with starting BMI 30  BMI 27.0-27.9,adult  Family discord Recent divorce, health problems of aging parents and stressed relationship daughter and her baby over the past few moths has disrupted sleep, eating schedule and motivation to make healthy changes  Continue to work on grocery shopping ,meal planning and regular walking OK to keep distance with family members that are the source of her stress Consider counseling     She was informed of the importance of frequent follow up visits to maximize her success with intensive lifestyle modifications for her multiple health conditions.   ATTESTASTION STATEMENTS:  Reviewed by clinician on day of visit: allergies, medications, problem list, medical history, surgical history, family history, social history, and previous encounter notes pertinent to obesity diagnosis.      Jillian Green, D.O. DABFM, DABOM [facility] [address] [place], [place] [address] [phone]

## 2023-11-23 NOTE — Patient Instructions (Signed)
 Set a goal for 45 min of walking 5 days/ wk -- can break up  Try to log intake on the MyFitnessPal App Aim for 1500 cal/ day This should include 85 g of protein or more daily  Remember lean protein with meals Fruits and veggies daily Hydrate well with water

## 2023-11-24 LAB — HEMOGLOBIN A1C
Est. average glucose Bld gHb Est-mCnc: 148 mg/dL
Hgb A1c MFr Bld: 6.8 % — ABNORMAL HIGH (ref 4.8–5.6)

## 2023-11-25 MED ORDER — ONDANSETRON 8 MG PO TBDP: 8.0000 mg | ORAL_TABLET | Freq: Three times a day (TID) | ORAL | 0 refills | Status: DC | PRN

## 2023-11-25 MED ORDER — TIRZEPATIDE 2.5 MG/0.5ML ~~LOC~~ SOAJ: 2.5000 mg | SUBCUTANEOUS | 0 refills | Status: DC

## 2023-11-25 NOTE — Addendum Note (Signed)
 Addended by: Luana Rumple on: 11/25/2023 04:50 PM   Modules accepted: Orders

## 2023-12-02 ENCOUNTER — Telehealth: Payer: Self-pay | Admitting: *Deleted

## 2023-12-02 NOTE — Telephone Encounter (Signed)
Prior authorization done via cover my meds for patients Mounjaro.

## 2023-12-03 NOTE — Telephone Encounter (Signed)
 Prior authorization approved for Mounjaro. Patient notified.  12/02/2023-12/01/2024.

## 2023-12-08 ENCOUNTER — Other Ambulatory Visit: Payer: Self-pay | Admitting: Family Medicine

## 2023-12-08 DIAGNOSIS — F3289 Other specified depressive episodes: Secondary | ICD-10-CM

## 2023-12-11 ENCOUNTER — Ambulatory Visit (INDEPENDENT_AMBULATORY_CARE_PROVIDER_SITE_OTHER): Admitting: Physician Assistant

## 2023-12-11 ENCOUNTER — Encounter: Payer: Self-pay | Admitting: Family Medicine

## 2023-12-11 ENCOUNTER — Encounter: Payer: Self-pay | Admitting: Physician Assistant

## 2023-12-11 VITALS — BP 124/78 | HR 93 | Ht 65.0 in | Wt 173.6 lb

## 2023-12-11 DIAGNOSIS — B379 Candidiasis, unspecified: Secondary | ICD-10-CM | POA: Diagnosis not present

## 2023-12-11 DIAGNOSIS — T3695XA Adverse effect of unspecified systemic antibiotic, initial encounter: Secondary | ICD-10-CM

## 2023-12-11 DIAGNOSIS — R300 Dysuria: Secondary | ICD-10-CM | POA: Diagnosis not present

## 2023-12-11 DIAGNOSIS — N3001 Acute cystitis with hematuria: Secondary | ICD-10-CM | POA: Diagnosis not present

## 2023-12-11 LAB — POC URINALSYSI DIPSTICK (AUTOMATED)
Bilirubin, UA: NEGATIVE
Glucose, UA: NEGATIVE
Ketones, UA: NEGATIVE
Nitrite, UA: NEGATIVE
Protein, UA: NEGATIVE
Spec Grav, UA: <=1.005 — AB (ref 1.010–1.025)
Urobilinogen, UA: 0.2 U/dL
pH, UA: 5 (ref 5.0–8.0)

## 2023-12-11 MED ORDER — NITROFURANTOIN MONOHYD MACRO 100 MG PO CAPS: 100.0000 mg | ORAL_CAPSULE | Freq: Two times a day (BID) | ORAL | 0 refills | Status: AC

## 2023-12-11 MED ORDER — PHENAZOPYRIDINE HCL 200 MG PO TABS: 200.0000 mg | ORAL_TABLET | Freq: Three times a day (TID) | ORAL | 0 refills | Status: DC | PRN

## 2023-12-11 MED ORDER — FLUCONAZOLE 150 MG PO TABS: 150.0000 mg | ORAL_TABLET | Freq: Once | ORAL | 0 refills | Status: AC

## 2023-12-11 NOTE — Progress Notes (Signed)
 Established patient visit   Patient: Jillian Green   DOB: [birth year]   53 y.o. Female  MRN: [id number] Visit Date: 12/11/2023  Today's healthcare provider: Trenton Frock, PA-C   Cc. Uti symptoms  Subjective     Pt reports urinary frequency x a few days, dysuria, hematuria today. Denies lower abdominal pain, back pain.   Medications: Outpatient Medications Prior to Visit  Medication Sig   ALPRAZolam  (XANAX ) 0.5 MG tablet Take 1 tablet (0.5 mg total) by mouth 3 (three) times daily as needed for anxiety.   BD PEN NEEDLE NANO U/F 32G X 4 MM MISC USE TWICE A DAY   blood glucose meter kit and supplies Dispense based on patient and insurance preference. Use up to four times daily as directed. (FOR ICD-10 E10.9, E11.9).   buPROPion  (WELLBUTRIN  XL) 300 MG 24 hr tablet TAKE 1 TABLET BY MOUTH EVERY DAY   DULoxetine  (CYMBALTA ) 60 MG capsule Take 1 capsule (60 mg total) by mouth 2 (two) times daily. Overdue for cpx   glucose blood (ONETOUCH ULTRA) test strip USE AS INSTRUCTED   hydrOXYzine  (ATARAX ) 25 MG tablet TAKE 1 TO 2 TABLETS BY MOUTH AT BEDTIME AS NEEDED FOR SLEEP   lisdexamfetamine (VYVANSE ) 70 MG capsule Take 1 capsule (70 mg total) by mouth daily.   metFORMIN  (GLUCOPHAGE ) 500 MG tablet Take 2 tablets (1,000 mg total) by mouth 2 (two) times daily.   ondansetron  (ZOFRAN -ODT) 8 MG disintegrating tablet Take 1 tablet (8 mg total) by mouth every 8 (eight) hours as needed.   OneTouch Delica Lancets 30G MISC 1 Device by Does not apply route daily.   rizatriptan  (MAXALT -MLT) 10 MG disintegrating tablet Take 1 tablet (10 mg total) by mouth as needed for migraine. May repeat in 2 hours if needed   tirzepatide (MOUNJARO) 2.5 MG/0.5ML Pen Inject 2.5 mg into the skin once a week.   topiramate  (TOPAMAX ) 50 MG tablet Take 1 tablet (50 mg total) by mouth daily.   valACYclovir  (VALTREX ) 1000 MG tablet Take 1 tablet (1,000 mg total) by mouth 2 (two) times daily.   No facility-administered  medications prior to visit.    Review of Systems  Constitutional:  Negative for fatigue and fever.  Respiratory:  Negative for cough and shortness of breath.   Cardiovascular:  Negative for chest pain and leg swelling.  Gastrointestinal:  Negative for abdominal pain.  Genitourinary:  Positive for dysuria, frequency, hematuria and urgency.  Neurological:  Negative for dizziness and headaches.       Objective    BP 124/78   Pulse 93   Ht 5\' 5"  (1.651 m)   Wt 173 lb 9.6 oz (78.7 kg)   LMP 09/12/2018   BMI 28.89 kg/m    Physical Exam Constitutional:      General: She is awake.     Appearance: She is well-developed.  HENT:     Head: Normocephalic.  Eyes:     Conjunctiva/sclera: Conjunctivae normal.  Cardiovascular:     Rate and Rhythm: Normal rate and regular rhythm.     Heart sounds: Normal heart sounds.  Pulmonary:     Effort: Pulmonary effort is normal.     Breath sounds: Normal breath sounds.  Abdominal:     Tenderness: There is no right CVA tenderness or left CVA tenderness.  Skin:    General: Skin is warm.  Neurological:     Mental Status: She is alert and oriented to person, place, and time.  Psychiatric:  Attention and Perception: Attention normal.        Mood and Affect: Mood normal.        Speech: Speech normal.        Behavior: Behavior is cooperative.      Results for orders placed or performed in visit on 12/11/23  POCT Urinalysis Dipstick (Automated)  Result Value Ref Range   Color, UA yellow    Clarity, UA cloudy    Glucose, UA Negative Negative   Bilirubin, UA neg    Ketones, UA neg    Spec Grav, UA <=1.005 (A) 1.010 - 1.025   Blood, UA trace    pH, UA 5.0 5.0 - 8.0   Protein, UA Negative Negative   Urobilinogen, UA 0.2 0.2 or 1.0 E.U./dL   Nitrite, UA neg    Leukocytes, UA Moderate (2+) (A) Negative    Assessment & Plan    Acute cystitis with hematuria -     Nitrofurantoin  Monohyd Macro; Take 1 capsule (100 mg total) by mouth 2  (two) times daily.  Dispense: 10 capsule; Refill: 0 -     Phenazopyridine HCl; Take 1 tablet (200 mg total) by mouth 3 (three) times daily as needed for pain.  Dispense: 10 tablet; Refill: 0  Antibiotic-induced yeast infection -     Fluconazole ; Take 1 tablet (150 mg total) by mouth once for 1 dose.  Dispense: 1 tablet; Refill: 0  Dysuria -     POCT Urinalysis Dipstick (Automated) -     Urine Culture   UA + ordering culture.  Rx macrobid  bid x 5 days, rx diflucan  for antibiotic caused yeast infection if symptoms present  Return if symptoms worsen or fail to improve.       Trenton Frock, PA-C  [facility] [phone] (phone) [phone] (fax)  [facility]

## 2023-12-12 LAB — URINE CULTURE
MICRO NUMBER:: 16436305
SPECIMEN QUALITY:: ADEQUATE

## 2023-12-14 ENCOUNTER — Encounter: Payer: Self-pay | Admitting: Physician Assistant

## 2023-12-14 ENCOUNTER — Other Ambulatory Visit: Payer: Self-pay | Admitting: Family Medicine

## 2023-12-14 DIAGNOSIS — F909 Attention-deficit hyperactivity disorder, unspecified type: Secondary | ICD-10-CM

## 2023-12-15 NOTE — Telephone Encounter (Signed)
 Requesting: Vyvanse  70 mg Contract: 05/29/2023 UDS: 05/29/2023 Last Visit: 12/11/2023 Next Visit: N/A Last Refill: 11/03/2023  Please Advise

## 2023-12-16 ENCOUNTER — Encounter: Payer: Self-pay | Admitting: Family Medicine

## 2023-12-16 DIAGNOSIS — F909 Attention-deficit hyperactivity disorder, unspecified type: Secondary | ICD-10-CM

## 2023-12-16 NOTE — Telephone Encounter (Signed)
 Requesting: Vyvanse  Contract: 05/2023 UDS: 05/2023 Last OV: 12/11/23 Next OV: n/a Last Refill: 11/03/2023, #30--0 RF Database:   Please advise

## 2023-12-17 ENCOUNTER — Other Ambulatory Visit: Payer: Self-pay | Admitting: Family Medicine

## 2023-12-17 ENCOUNTER — Encounter: Payer: Self-pay | Admitting: Family Medicine

## 2023-12-17 DIAGNOSIS — F909 Attention-deficit hyperactivity disorder, unspecified type: Secondary | ICD-10-CM

## 2023-12-17 NOTE — Telephone Encounter (Unsigned)
 Copied from CRM [id number]. Topic: Clinical - Medication Refill >> Dec 17, 2023  2:14 PM Leah C wrote: Medication: lisdexamfetamine (VYVANSE ) 70 MG capsule  Has the patient contacted their pharmacy? Yes, Pharmacy stated the prescription could not be filled until she saw her doctor. Patient scheduled an appointment for next Thursday. (Agent: If no, request that the patient contact the pharmacy for the refill. If patient does not wish to contact the pharmacy document the reason why and proceed with request.) (Agent: If yes, when and what did the pharmacy advise?)  This is the patient's preferred pharmacy:  [id number] [address], [place] - [address] [place] [address] Phone: [phone] Fax: [phone]  Is this the correct pharmacy for this prescription? Yes If no, delete pharmacy and type the correct one.   Has the prescription been filled recently? No  Is the patient out of the medication? Yes  Has the patient been seen for an appointment in the last year OR does the patient have an upcoming appointment? Yes  Can we respond through MyChart? Yes  Agent: Please be advised that Rx refills may take up to 3 business days. We ask that you follow-up with your pharmacy.

## 2023-12-17 NOTE — Telephone Encounter (Signed)
 Requesting: Vyvanse  70mg   Contract:05/29/23 UDS:05/29/23 Last Visit: 11/05/23 Next Visit:  12/24/23 Last Refill: 11/03/23 #30 and 0RF   Please Advise

## 2023-12-18 MED ORDER — LISDEXAMFETAMINE DIMESYLATE 70 MG PO CAPS: 70.0000 mg | ORAL_CAPSULE | Freq: Every day | ORAL | 0 refills | Status: DC

## 2023-12-19 ENCOUNTER — Ambulatory Visit
Admission: EM | Admit: 2023-12-19 | Discharge: 2023-12-19 | Disposition: A | Discharge status: Home / Self Care | Attending: Physician Assistant | Admitting: Physician Assistant

## 2023-12-19 DIAGNOSIS — R399 Unspecified symptoms and signs involving the genitourinary system: Secondary | ICD-10-CM | POA: Diagnosis present

## 2023-12-19 LAB — POCT URINALYSIS DIP (MANUAL ENTRY)
Bilirubin, UA: NEGATIVE
Glucose, UA: NEGATIVE mg/dL
Ketones, POC UA: NEGATIVE mg/dL
Nitrite, UA: NEGATIVE
Protein Ur, POC: 100 mg/dL — AB
Spec Grav, UA: 1.02
Urobilinogen, UA: 0.2 U/dL
pH, UA: 7.5

## 2023-12-19 MED ORDER — PHENAZOPYRIDINE HCL 100 MG PO TABS: 100.0000 mg | ORAL_TABLET | Freq: Three times a day (TID) | ORAL | 0 refills | Status: AC | PRN

## 2023-12-19 MED ORDER — SULFAMETHOXAZOLE-TRIMETHOPRIM 800-160 MG PO TABS: 1.0000 | ORAL_TABLET | Freq: Two times a day (BID) | ORAL | 0 refills | Status: AC

## 2023-12-19 NOTE — Discharge Instructions (Signed)
 Can take antibiotic as directed Will call with lab results if a change in treatment plan is indicated If no relief or symptoms become worse follow up with your Primary Care Physician

## 2023-12-19 NOTE — ED Triage Notes (Signed)
 Patient presents to [facility] for hematuria. States she was treated for a UTI 05/09. Now seeing blood in urine. Took OTC symptom relief.

## 2023-12-19 NOTE — ED Provider Notes (Signed)
 [facility]    CSN: [id number] Arrival date & time: 12/19/23  1236      History   Chief Complaint No chief complaint on file.   HPI Jillian Green is a 53 y.o. female.   Pt complains of persistent dysuria and hematuria.  She was seen by PCP 5/9, treated with macrobid . Urine culture negative.  She reports no improvement of sx.  Denies flank pain, fever, chills, vaginal discharge, pelvic pain.      Past Medical History:  Diagnosis Date   ADD (attention deficit disorder)    Anxiety    Back pain    Chronic headaches    Depression    Fibromyalgia    GERD (gastroesophageal reflux disease)    Itchy skin    Joint pain    Lactose intolerance    Migraine    Sweating increase    Type 2 diabetes mellitus without complication, without long-term current use of insulin  (HCC) 09/13/2018   Unspecified eustachian tube disorder     Patient Active Problem List   Diagnosis Date Noted   Family discord 11/23/2023   Vitamin D  deficiency 11/05/2022   BMI 24.0-24.9, adult 11/05/2022   Generalized obesity with starting BMI 30 05/20/2022   Diabetes mellitus (HCC) 09/13/2018   Other hyperlipidemia 09/13/2018   Other fatigue 04/29/2018   Shortness of breath on exertion 04/29/2018   Nonintractable migraine, unspecified migraine type 02/14/2018   Seasonal allergies 02/14/2018   Right foot pain 12/07/2017   Fibromyalgia 07/26/2015   Right foot sprain 10/10/2014   Acute pharyngitis 09/04/2014   ADD (attention deficit disorder) 09/04/2014   CTS (carpal tunnel syndrome) 09/23/2013   Dizzy 09/23/2013   Headache 09/23/2013   Left tennis elbow 10/19/2012   Depression 01/07/2010   HIP PAIN, LEFT 03/02/2009   SINUSITIS- ACUTE-NOS 08/24/2008   RHINITIS 07/12/2008   Attention deficit disorder 05/22/2008   Migraine without aura 03/26/2007   Eustachian tube disorder 03/26/2007    Past Surgical History:  Procedure Laterality Date   ABDOMINAL HYSTERECTOMY  03/20/2021    CESAREAN SECTION     OTHER SURGICAL HISTORY     preforated uterine surgery   SINUS EXPLORATION      OB History     Gravida  2   Para  1   Term      Preterm      AB      Living         SAB      IAB      Ectopic      Multiple      Live Births               Home Medications    Prior to Admission medications   Medication Sig Start Date End Date Taking? Authorizing Provider  phenazopyridine  (PYRIDIUM ) 100 MG tablet Take 1 tablet (100 mg total) by mouth 3 (three) times daily as needed for up to 2 days for pain. 12/19/23 12/21/23 Yes Ward, Sevyn Paredez Z, PA-C  sulfamethoxazole-trimethoprim (BACTRIM DS) 800-160 MG tablet Take 1 tablet by mouth 2 (two) times daily for 3 days. 12/19/23 12/22/23 Yes Ward, Char Common, PA-C  ALPRAZolam  (XANAX ) 0.5 MG tablet Take 1 tablet (0.5 mg total) by mouth 3 (three) times daily as needed for anxiety. 11/05/23   Estill Hemming, DO  BD PEN NEEDLE NANO U/F 32G X 4 MM MISC USE TWICE A DAY 12/20/18   Amada Backer, PA-C  blood glucose meter kit and supplies  Dispense based on patient and insurance preference. Use up to four times daily as directed. (FOR ICD-10 E10.9, E11.9). 08/23/19   Amada Backer, PA-C  buPROPion  (WELLBUTRIN  XL) 300 MG 24 hr tablet TAKE 1 TABLET BY MOUTH EVERY DAY 12/08/23   Crecencio Dodge, Yvonne R, DO  DULoxetine  (CYMBALTA ) 60 MG capsule Take 1 capsule (60 mg total) by mouth 2 (two) times daily. Overdue for cpx 11/05/23   Crecencio Dodge, Adel Holt R, DO  glucose blood [facility] ULTRA) test strip USE AS INSTRUCTED 05/20/22   Rayburn, Evangelina Hilt, PA-C  hydrOXYzine  (ATARAX ) 25 MG tablet TAKE 1 TO 2 TABLETS BY MOUTH AT BEDTIME AS NEEDED FOR SLEEP 11/25/21   Crecencio Dodge, Candida Chalk, DO  lisdexamfetamine (VYVANSE ) 70 MG capsule Take 1 capsule (70 mg total) by mouth daily. 12/18/23   Estill Hemming, DO  lisdexamfetamine (VYVANSE ) 70 MG capsule Take 1 capsule (70 mg total) by mouth daily. 12/18/23   Estill Hemming, DO  metFORMIN   (GLUCOPHAGE ) 500 MG tablet Take 2 tablets (1,000 mg total) by mouth 2 (two) times daily. 11/23/23   Bowen, Leeta Puls, DO  nitrofurantoin , macrocrystal-monohydrate, (MACROBID ) 100 MG capsule Take 1 capsule (100 mg total) by mouth 2 (two) times daily. 12/11/23   Drubel, Heidi Llamas, PA-C  ondansetron  (ZOFRAN -ODT) 8 MG disintegrating tablet Take 1 tablet (8 mg total) by mouth every 8 (eight) hours as needed. 11/25/23   Bowen, Leeta Puls, DO  OneTouch Delica Lancets 30G MISC 1 Device by Does not apply route daily. 05/20/22   Rayburn, Evangelina Hilt, PA-C  rizatriptan  (MAXALT -MLT) 10 MG disintegrating tablet Take 1 tablet (10 mg total) by mouth as needed for migraine. May repeat in 2 hours if needed 03/20/20   Crecencio Dodge, Yvonne R, DO  tirzepatide (MOUNJARO) 2.5 MG/0.5ML Pen Inject 2.5 mg into the skin once a week. 11/25/23   Bowen, Leeta Puls, DO  topiramate  (TOPAMAX ) 50 MG tablet Take 1 tablet (50 mg total) by mouth daily. 11/23/23   Bowen, Leeta Puls, DO  valACYclovir  (VALTREX ) 1000 MG tablet Take 1 tablet (1,000 mg total) by mouth 2 (two) times daily. 11/16/23   Estill Hemming, DO    Family History Family History  Problem Relation Age of Onset   Other Father        Dyslexia   Heart disease Father    Hypertension Father    Alcoholism Father    Hypertension Mother    Thyroid  disease Mother    Anxiety disorder Mother    Sleep apnea Mother    Obesity Mother    ADD / ADHD Sister     Social History Social History   Tobacco Use   Smoking status: Former    Current packs/day: 0.00    Types: Cigarettes    Start date: 05/15/1998    Quit date: 05/15/2006    Years since quitting: 17.6   Smokeless tobacco: Never   Tobacco comments:    1 cig every 4 months  Vaping Use   Vaping status: Never Used  Substance Use Topics   Alcohol use: Not Currently    Comment: occasionally   Drug use: No     Allergies   Augmentin [amoxicillin-pot clavulanate] and Other   Review of Systems Review of Systems   Constitutional:  Negative for chills and fever.  HENT:  Negative for ear pain and sore throat.   Eyes:  Negative for pain and visual disturbance.  Respiratory:  Negative for cough and shortness of breath.  Cardiovascular:  Negative for chest pain and palpitations.  Gastrointestinal:  Negative for abdominal pain and vomiting.  Genitourinary:  Positive for dysuria and hematuria.  Musculoskeletal:  Negative for arthralgias and back pain.  Skin:  Negative for color change and rash.  Neurological:  Negative for seizures and syncope.  All other systems reviewed and are negative.    Physical Exam Triage Vital Signs ED Triage Vitals  Encounter Vitals Group     BP 12/19/23 1246 131/84     Systolic BP Percentile --      Diastolic BP Percentile --      Pulse Rate 12/19/23 1246 94     Resp 12/19/23 1246 16     Temp 12/19/23 1246 97.8 F (36.6 C)     Temp Source 12/19/23 1246 Oral     SpO2 12/19/23 1246 95 %     Weight --      Height --      Head Circumference --      Peak Flow --      Pain Score 12/19/23 1247 0     Pain Loc --      Pain Education --      Exclude from Growth Chart --    No data found.  Updated Vital Signs BP 131/84 (BP Location: Right Arm)   Pulse 94   Temp 97.8 F (36.6 C) (Oral)   Resp 16   LMP 09/12/2018   SpO2 95%   Visual Acuity Right Eye Distance:   Left Eye Distance:   Bilateral Distance:    Right Eye Near:   Left Eye Near:    Bilateral Near:     Physical Exam Vitals and nursing note reviewed.  Constitutional:      General: She is not in acute distress.    Appearance: She is well-developed.  HENT:     Head: Normocephalic and atraumatic.  Eyes:     Conjunctiva/sclera: Conjunctivae normal.  Cardiovascular:     Rate and Rhythm: Normal rate and regular rhythm.     Heart sounds: No murmur heard. Pulmonary:     Effort: Pulmonary effort is normal. No respiratory distress.     Breath sounds: Normal breath sounds.  Abdominal:      Palpations: Abdomen is soft.     Tenderness: There is no abdominal tenderness.  Musculoskeletal:        General: No swelling.     Cervical back: Neck supple.  Skin:    General: Skin is warm and dry.     Capillary Refill: Capillary refill takes less than 2 seconds.  Neurological:     Mental Status: She is alert.  Psychiatric:        Mood and Affect: Mood normal.      UC Treatments / Results  Labs (all labs ordered are listed, but only abnormal results are displayed) Labs Reviewed  POCT URINALYSIS DIP (MANUAL ENTRY) - Abnormal; Notable for the following components:      Result Value   Clarity, UA cloudy (*)    Blood, UA large (*)    Protein Ur, POC =100 (*)    Leukocytes, UA Large (3+) (*)    All other components within normal limits  URINE CULTURE  CERVICOVAGINAL ANCILLARY ONLY    EKG   Radiology No results found.  Procedures Procedures (including critical care time)  Medications Ordered in UC Medications - No data to display  Initial Impression / Assessment and Plan / UC Course  I have reviewed the  triage vital signs and the nursing notes.  Pertinent labs & imaging results that were available during my care of the patient were reviewed by me and considered in my medical decision making (see chart for details).     Pt with continued dysuria and hematuria.  Not convinced she has a UTI at this time based on previous culture and today's UA.  She does have increased leukocytes.  Will initiate a different antibiotic and send urine culture today.  Cervicovaginal self swab in clinic today to rule out vaginosis/STDs.  If no improvement advised follow up with PCP for further evaluation.  Final Clinical Impressions(s) / UC Diagnoses   Final diagnoses:  UTI symptoms   Discharge Instructions      Can take antibiotic as directed Will call with lab results if a change in treatment plan is indicated If no relief or symptoms become worse follow up with your Primary Care  Physician   ED Prescriptions     Medication Sig Dispense Auth. Provider   sulfamethoxazole-trimethoprim (BACTRIM DS) 800-160 MG tablet Take 1 tablet by mouth 2 (two) times daily for 3 days. 6 tablet Ward, Melysa Schroyer Z, PA-C   phenazopyridine  (PYRIDIUM ) 100 MG tablet Take 1 tablet (100 mg total) by mouth 3 (three) times daily as needed for up to 2 days for pain. 6 tablet Ward, Elpidio Thielen Z, PA-C      PDMP not reviewed this encounter.   Ward, Char Common, PA-C 12/19/23 1419

## 2023-12-21 ENCOUNTER — Ambulatory Visit (HOSPITAL_COMMUNITY): Payer: Self-pay

## 2023-12-21 LAB — URINE CULTURE: Culture: <10000 — AB

## 2023-12-21 LAB — CERVICOVAGINAL ANCILLARY ONLY
Bacterial Vaginitis (gardnerella): POSITIVE — AB
Candida Glabrata: NEGATIVE
Candida Vaginitis: NEGATIVE
Chlamydia: NEGATIVE
Comment: NEGATIVE
Comment: NEGATIVE
Comment: NEGATIVE
Comment: NEGATIVE
Comment: NEGATIVE
Comment: NORMAL
Neisseria Gonorrhea: NEGATIVE
Trichomonas: NEGATIVE

## 2023-12-22 ENCOUNTER — Ambulatory Visit: Admitting: Family Medicine

## 2023-12-22 MED ORDER — METRONIDAZOLE 500 MG PO TABS: 500.0000 mg | ORAL_TABLET | Freq: Two times a day (BID) | ORAL | 0 refills | Status: AC

## 2023-12-24 ENCOUNTER — Ambulatory Visit: Admitting: Family Medicine

## 2023-12-29 ENCOUNTER — Ambulatory Visit (INDEPENDENT_AMBULATORY_CARE_PROVIDER_SITE_OTHER): Admitting: Family Medicine

## 2023-12-29 ENCOUNTER — Encounter: Payer: Self-pay | Admitting: Family Medicine

## 2023-12-29 DIAGNOSIS — F909 Attention-deficit hyperactivity disorder, unspecified type: Secondary | ICD-10-CM | POA: Diagnosis not present

## 2023-12-29 DIAGNOSIS — F419 Anxiety disorder, unspecified: Secondary | ICD-10-CM | POA: Diagnosis not present

## 2023-12-29 MED ORDER — LISDEXAMFETAMINE DIMESYLATE 70 MG PO CAPS: 70.0000 mg | ORAL_CAPSULE | Freq: Every day | ORAL | 0 refills | Status: DC

## 2023-12-29 MED ORDER — ALPRAZOLAM 0.5 MG PO TABS: 0.5000 mg | ORAL_TABLET | Freq: Three times a day (TID) | ORAL | 1 refills | Status: DC | PRN

## 2023-12-29 NOTE — Progress Notes (Signed)
 Established Patient Office Visit  Subjective   Patient ID: Jillian Green, female    DOB: 1970/10/22  Age: 53 y.o. MRN: [id number]  Chief Complaint  Patient presents with   Follow-up    HPI Discussed the use of AI scribe software for clinical note transcription with the patient, who gave verbal consent to proceed.  History of Present Illness Jillian Green is a 53 year old female who presents for medication management and follow-up.  She has not started Mounjaro due to concerns about potential side effects, as she experienced adverse reactions with previous medications including Rybelsus , Saxenda , and Ozempic. She plans to begin the medication after school testing to avoid being sick during this period.  She requests refills for her medications, including Xanax . She recently refilled her Vyvanse  about a week and a half ago, around May 13th, and does not need a refill at this time.  She recently visited urgent care and was treated for a bacterial infection, possibly bacterial vaginosis, and completed a course of antibiotics for this condition.  She mentions personal stressors, including a strained relationship with her daughter, who did not allow her to see her grandchild for six months after a disagreement over financial support.   Patient Active Problem List   Diagnosis Date Noted   Family discord 11/23/2023   Vitamin D  deficiency 11/05/2022   BMI 24.0-24.9, adult 11/05/2022   Generalized obesity with starting BMI 30 05/20/2022   Diabetes mellitus (HCC) 09/13/2018   Other hyperlipidemia 09/13/2018   Other fatigue 04/29/2018   Shortness of breath on exertion 04/29/2018   Nonintractable migraine, unspecified migraine type 02/14/2018   Seasonal allergies 02/14/2018   Right foot pain 12/07/2017   Fibromyalgia  07/26/2015   Right foot sprain 10/10/2014   Acute pharyngitis 09/04/2014   ADD (attention deficit disorder) 09/04/2014   CTS (carpal tunnel syndrome) 09/23/2013   Dizzy 09/23/2013   Headache 09/23/2013   Left tennis elbow 10/19/2012   Depression 01/07/2010   HIP PAIN, LEFT 03/02/2009   SINUSITIS- ACUTE-NOS 08/24/2008   RHINITIS 07/12/2008   Attention deficit disorder 05/22/2008   Migraine without aura 03/26/2007   Eustachian tube disorder 03/26/2007   Past Medical History:  Diagnosis Date   ADD (attention deficit disorder)    Anxiety    Back pain    Chronic headaches    Depression    Fibromyalgia    GERD (gastroesophageal reflux disease)    Itchy skin    Joint pain    Lactose intolerance    Migraine    Sweating increase    Type 2 diabetes mellitus without  complication, without long-term current use of insulin  (HCC) 09/13/2018   Unspecified eustachian tube disorder    Past Surgical History:  Procedure Laterality Date   ABDOMINAL HYSTERECTOMY  03/20/2021   CESAREAN SECTION     OTHER SURGICAL HISTORY     preforated uterine surgery   SINUS EXPLORATION     Social History   Tobacco Use   Smoking status: Former    Current packs/day: 0.00    Types: Cigarettes    Start date: 05/15/1998    Quit date: 05/15/2006    Years since quitting: 17.6   Smokeless tobacco: Never   Tobacco comments:    1 cig every 4 months  Vaping Use   Vaping status: Never Used  Substance Use Topics   Alcohol use: Not Currently    Comment: occasionally   Drug use: No   Social History   Socioeconomic History   Marital status: Single    Spouse name: Programme researcher, broadcasting/film/video   Number of children: 1   Years of education: Not on file   Highest education level: Not on file  Occupational History   Occupation: middle Corporate investment banker: [employer]  Tobacco Use   Smoking status: Former    Current packs/day: 0.00    Types: Cigarettes    Start date: 05/15/1998    Quit date:  05/15/2006    Years since quitting: 17.6   Smokeless tobacco: Never   Tobacco comments:    1 cig every 4 months  Vaping Use   Vaping status: Never Used  Substance and Sexual Activity   Alcohol use: Not Currently    Comment: occasionally   Drug use: No   Sexual activity: Yes    Partners: Male  Other Topics Concern   Not on file  Social History Narrative   Exercise--  Yes   Social Drivers of Health   Financial Resource Strain: Not on file  Food Insecurity: Not on file  Transportation Needs: Not on file  Physical Activity: Not on file  Stress: Not on file  Social Connections: Not on file  Intimate Partner Violence: Not on file   Family Status  Relation Name Status   Father  Alive   Mother  Alive   Sister  (Not Specified)  No partnership data on file   Family History  Problem Relation Age of Onset   Other Father        Dyslexia   Heart disease Father    Hypertension Father    Alcoholism Father    Hypertension Mother    Thyroid  disease Mother    Anxiety disorder Mother    Sleep apnea Mother    Obesity Mother    ADD / ADHD Sister    Allergies  Allergen Reactions   Augmentin [Amoxicillin-Pot Clavulanate]     Severe yeast infections   Other Nausea And Vomiting    Rybelsus  Pt described severe vomiting and GI issues.      Review of Systems  Constitutional:  Negative for chills, fever and malaise/fatigue.  HENT:  Negative for congestion and hearing loss.   Eyes:  Negative for blurred vision and discharge.  Respiratory:  Negative for cough, sputum production and shortness of breath.   Cardiovascular:  Negative for chest pain, palpitations and leg swelling.  Gastrointestinal:  Negative for abdominal pain, blood in stool, constipation, diarrhea, heartburn, nausea and vomiting.  Genitourinary:  Negative for dysuria, frequency, hematuria and urgency.  Musculoskeletal:  Negative for back pain, falls and myalgias.  Skin:  Negative for rash.  Neurological:  Negative  for dizziness, sensory change, loss of consciousness, weakness and headaches.  Endo/Heme/Allergies:  Negative for environmental allergies. Does not bruise/bleed easily.  Psychiatric/Behavioral:  Negative for depression and suicidal ideas. The patient is not nervous/anxious and does not have insomnia.       Objective:     BP 112/70   Pulse 100   Resp 18   Ht 5\' 5"  (1.651 m)   Wt 175 lb (79.4 kg)   LMP 09/12/2018   SpO2 98%   BMI 29.12 kg/m  BP Readings from Last 3 Encounters:  12/29/23 112/70  12/19/23 131/84  12/11/23 124/78   Wt Readings from Last 3 Encounters:  12/29/23 175 lb (79.4 kg)  12/11/23 173 lb 9.6 oz (78.7 kg)  11/23/23 165 lb (74.8 kg)   SpO2 Readings from Last 3 Encounters:  12/29/23 98%  12/19/23 95%  11/23/23 98%      Physical Exam Vitals and nursing note reviewed.  Constitutional:      General: She is not in acute distress.    Appearance: Normal appearance. She is well-developed.  HENT:     Head: Normocephalic and atraumatic.  Eyes:     General: No scleral icterus.       Right eye: No discharge.        Left eye: No discharge.  Cardiovascular:     Rate and Rhythm: Normal rate and regular rhythm.     Heart sounds: No murmur heard. Pulmonary:     Effort: Pulmonary effort is normal. No respiratory distress.     Breath sounds: Normal breath sounds.  Musculoskeletal:        General: Normal range of motion.     Cervical back: Normal range of motion and neck supple.     Right lower leg: No edema.     Left lower leg: No edema.  Skin:    General: Skin is warm and dry.  Neurological:     Mental Status: She is alert and oriented to person, place, and time.  Psychiatric:        Mood and Affect: Mood normal.        Behavior: Behavior normal.        Thought Content: Thought content normal.        Judgment: Judgment normal.      No results found for any visits on 12/29/23.  Last CBC Lab Results  Component Value Date   WBC 6.7 11/05/2023    HGB 14.2 11/05/2023   HCT 42.4 11/05/2023   MCV 95.3 11/05/2023   MCH 30.9 04/29/2018   RDW 12.5 11/05/2023   PLT 326.0 11/05/2023   Last metabolic panel Lab Results  Component Value Date   GLUCOSE 152 (H) 11/05/2023   NA 136 11/05/2023   K 4.1 11/05/2023   CL 101 11/05/2023   CO2 24 11/05/2023   BUN 15 11/05/2023   CREATININE 0.78 11/05/2023   GFR 87.97 11/05/2023   CALCIUM  9.1 11/05/2023   PROT 7.1 11/05/2023   ALBUMIN 4.7 11/05/2023   LABGLOB 2.3 05/25/2023   AGRATIO 2.1 10/30/2022   BILITOT 0.3 11/05/2023   ALKPHOS 107 11/05/2023   AST 13 11/05/2023   ALT 19 11/05/2023   ANIONGAP 12 10/23/2016   Last lipids Lab Results  Component Value Date   CHOL 200 11/05/2023   HDL 74.70 11/05/2023   LDLCALC 93 11/05/2023   TRIG 162.0 (H) 11/05/2023   CHOLHDL 3 11/05/2023   Last hemoglobin  A1c Lab Results  Component Value Date   HGBA1C 6.8 (H) 11/23/2023   Last thyroid  functions Lab Results  Component Value Date   TSH 0.78 11/05/2023   T3TOTAL 126 04/29/2018   Last vitamin D  Lab Results  Component Value Date   VD25OH 63.5 05/25/2023   Last vitamin B12 and Folate Lab Results  Component Value Date   VITAMINB12 846 05/25/2023   FOLATE 13.4 04/29/2018      The 10-year ASCVD risk score (Arnett DK, et al., 2019) is: 1.4%    Assessment & Plan:   Problem List Items Addressed This Visit       Unprioritized   ADD (attention deficit disorder)   Stable Contract and uds utd  Database reviewed  Return to office 6 months       Relevant Medications   lisdexamfetamine (VYVANSE ) 70 MG capsule (Start on 02/14/2024)   lisdexamfetamine (VYVANSE ) 70 MG capsule (Start on 01/15/2024)   lisdexamfetamine (VYVANSE ) 70 MG capsule (Start on 03/26/2024)   Other Visit Diagnoses       Anxiety       Relevant Medications   ALPRAZolam  (XANAX ) 0.5 MG tablet     Assessment and Plan Assessment & Plan Bacterial vaginosis   A recent episode was treated with antibiotics.  Symptoms were atypical, suggesting a possible atypical presentation or differential diagnosis. The condition appears to be resolving post-treatment.  Obesity   She has not started Mounjaro due to concerns about potential side effects experienced with similar medications like Rybelsus , Saxenda , and Ozempic. Plans to delay starting Mounjaro until after school testing responsibilities are completed to avoid potential illness during this period. Discuss potential side effects and management strategies for Mounjaro. Consider starting Mounjaro after the school testing period. Provide support and counseling regarding weight management.    Return in about 6 months (around 06/30/2024), or if symptoms worsen or fail to improve.    Treyvin Glidden R Lowne Chase, DO

## 2023-12-29 NOTE — Patient Instructions (Signed)

## 2023-12-29 NOTE — Assessment & Plan Note (Signed)
 Stable Contract and uds utd  Database reviewed  Return to office 6 months

## 2023-12-30 ENCOUNTER — Telehealth (HOSPITAL_COMMUNITY): Payer: Self-pay

## 2023-12-30 MED ORDER — FLUCONAZOLE 150 MG PO TABS: 150.0000 mg | ORAL_TABLET | Freq: Once | ORAL | 0 refills | Status: AC

## 2023-12-30 NOTE — Telephone Encounter (Signed)
 Pt requests Diflucan  for abx-associated yeast infection. Rx sent to pharmacy on file.

## 2024-01-18 ENCOUNTER — Ambulatory Visit (INDEPENDENT_AMBULATORY_CARE_PROVIDER_SITE_OTHER): Admitting: Family Medicine

## 2024-01-18 ENCOUNTER — Encounter: Payer: Self-pay | Admitting: Family Medicine

## 2024-01-18 VITALS — BP 133/90 | HR 90 | Temp 98.6°F | Ht 65.0 in | Wt 168.0 lb

## 2024-01-18 DIAGNOSIS — E1169 Type 2 diabetes mellitus with other specified complication: Secondary | ICD-10-CM | POA: Diagnosis not present

## 2024-01-18 DIAGNOSIS — F3289 Other specified depressive episodes: Secondary | ICD-10-CM

## 2024-01-18 DIAGNOSIS — E663 Overweight: Secondary | ICD-10-CM

## 2024-01-18 DIAGNOSIS — Z7984 Long term (current) use of oral hypoglycemic drugs: Secondary | ICD-10-CM

## 2024-01-18 DIAGNOSIS — F5089 Other specified eating disorder: Secondary | ICD-10-CM

## 2024-01-18 DIAGNOSIS — Z6827 Body mass index (BMI) 27.0-27.9, adult: Secondary | ICD-10-CM

## 2024-01-18 DIAGNOSIS — I10 Essential (primary) hypertension: Secondary | ICD-10-CM | POA: Diagnosis not present

## 2024-01-18 MED ORDER — TOPIRAMATE 50 MG PO TABS: 50.0000 mg | ORAL_TABLET | Freq: Every day | ORAL | 0 refills | Status: DC

## 2024-01-18 MED ORDER — METFORMIN HCL 500 MG PO TABS: 1000.0000 mg | ORAL_TABLET | Freq: Two times a day (BID) | ORAL | 0 refills | Status: DC

## 2024-01-18 NOTE — Progress Notes (Signed)
 Office: [phone]  /  Fax: [phone]  WEIGHT SUMMARY AND BIOMETRICS  Starting Date: 04/29/18  Starting Weight: 184lb   Weight Lost Since Last Visit: 0lb   Vitals Temp: 98.6 F (37 C) BP: (!) 133/90 Pulse Rate: 90 SpO2: 97 %   Body Composition  Body Fat %: 36.5 % Fat Mass (lbs): 61.4 lbs Muscle Mass (lbs): 101.4 lbs Total Body Water (lbs): 68.2 lbs Visceral Fat Rating : 8    HPI  Chief Complaint: OBESITY  Jillian Green is here to discuss her progress with her obesity treatment plan. She is on the the Category 3 Plan and states she is following her eating plan approximately 40 % of the time. She states she is exercising 0 minutes 0 times per week.  Interval History:  Since last office visit she is up 3 lb She is up 0.4 lb of muscle mass and up 2.2 lb of body fat This gives her a net weight loss of 16 lb in 4+ years She has not yet started Mounjaro  due to fear of GI upset that she previous GLP-1 RA SE Stress levels have been high She is consuming more sweets and struggles more with meal planning She plans to resume gym workouts She has a good support system at home She has talked to her daughter who moved out and got to see her grandbaby She is looking for a new job  Pharmacotherapy: metformin  1000 mg bid + topamax  50 mg daily  PHYSICAL EXAM:  Blood pressure (!) 133/90, pulse 90, temperature 98.6 F (37 C), height 5' 5 (1.651 m), weight 168 lb (76.2 kg), last menstrual period 09/12/2018, SpO2 97%. Body mass index is 27.96 kg/m.  General: She is healthy appearing, cooperative, alert, well developed, and in no acute distress. PSYCH: Has normal mood, affect and thought process.   Lungs: Normal breathing effort, no conversational dyspnea.   ASSESSMENT AND PLAN  TREATMENT PLAN FOR OBESITY:  Recommended Dietary Goals  Jillian Green is currently in the action stage of change. As such, her goal is to continue weight management plan. She has agreed to practicing  portion control and making smarter food choices, such as increasing vegetables and decreasing simple carbohydrates.  Behavioral Intervention  We discussed the following Behavioral Modification Strategies today: increasing lean protein intake to established goals, increasing fiber rich foods, increasing water intake , work on meal planning and preparation, reading food labels , keeping healthy foods at home, practice mindfulness eating and understand the difference between hunger signals and cravings, work on managing stress, creating time for self-care and relaxation, avoiding temptations and identifying enticing environmental cues, continue to work on implementation of reduced calorie nutritional plan, continue to practice mindfulness when eating, and continue to work on maintaining a reduced calorie state, getting the recommended amount of protein, incorporating whole foods, making healthy choices, staying well hydrated and practicing mindfulness when eating..  Additional resources provided today: NA  Recommended Physical Activity Goals  Jillian Green has been advised to work up to 150 minutes of moderate intensity aerobic activity a week and strengthening exercises 2-3 times per week for cardiovascular health, weight loss maintenance and preservation of muscle mass.   She has agreed to [employer] strengthening exercises with a goal of 2-3 sessions a week  and Start aerobic activity with a goal of 150 minutes a week at moderate intensity.  Gym goal set for 3+ days/ wk  Pharmacotherapy changes for the treatment of obesity: she has RX for Mounjaro  2.5 mg weekly to start  ASSOCIATED CONDITIONS ADDRESSED TODAY  Type 2 diabetes mellitus with other specified complication, without long-term current use of insulin  (HCC) Lab Results  Component Value Date   HGBA1C 6.8 (H) 11/23/2023  She is doing well on metformin  1000 mg twice daily without adverse side effect.  She is working on reducing her intake of added  sugar and starches.  She has room for improvement with regular exercise and we discussed her goals of gym workouts at least 3 days a week.  She is not checking her blood sugars.  She has not yet started Mounjaro  2.5 mg weekly injection due to fear of GI upset.  She does have a prescription for Zofran  for as needed use and we discussed dosing with Zofran  1 tab at the time of her injection and 1 the day after.  Reviewed behavior changes to help offset the nausea that occurs from use of a GLP-1 receptor agonist.  -     metFORMIN  HCl; Take 2 tablets (1,000 mg total) by mouth 2 (two) times daily.  Dispense: 120 tablet; Refill: 0  Emotional Eating Behavior Improving on topiramate  50 mg once daily without adverse side effect.  She is also working on stress reduction and has a good support system at home. -     Topiramate ; Take 1 tablet (50 mg total) by mouth daily.  Dispense: 90 tablet; Refill: 0  Overweight (BMI 25.0-29.9)  BMI 27.0-27.9,adult  Primary hypertension Diastolic blood pressure is elevated at 90 today.  She is currently not on any medication for hypertension.  With her last A1c being in the diabetic range, recommend follow-up with Dr. Jalene Mayor for evaluation and treatment.  Continue active plan for weight reduction.  Watch for elevated blood pressure readings with use of Vyvanse  for ADHD.     She was informed of the importance of frequent follow up visits to maximize her success with intensive lifestyle modifications for her multiple health conditions.   ATTESTASTION STATEMENTS:  Reviewed by clinician on day of visit: allergies, medications, problem list, medical history, surgical history, family history, social history, and previous encounter notes pertinent to obesity diagnosis.   I have personally spent 30 minutes total time today in preparation, patient care, nutritional counseling and education,  and documentation for this visit, including the following: review of most recent  clinical lab tests, prescribing medications/ refilling medications, reviewing medical assistant documentation, review and interpretation of bioimpedence results.     Jillian Green, D.O. DABFM, DABOM [facility] [address] [place], [place] [address] [phone]

## 2024-02-09 ENCOUNTER — Ambulatory Visit (INDEPENDENT_AMBULATORY_CARE_PROVIDER_SITE_OTHER): Admitting: Family Medicine

## 2024-02-09 VITALS — BP 126/84 | HR 86 | Temp 97.9°F | Ht 65.0 in | Wt 165.0 lb

## 2024-02-09 DIAGNOSIS — E1169 Type 2 diabetes mellitus with other specified complication: Secondary | ICD-10-CM | POA: Diagnosis not present

## 2024-02-09 DIAGNOSIS — Z638 Other specified problems related to primary support group: Secondary | ICD-10-CM | POA: Diagnosis not present

## 2024-02-09 DIAGNOSIS — R110 Nausea: Secondary | ICD-10-CM

## 2024-02-09 DIAGNOSIS — Z6827 Body mass index (BMI) 27.0-27.9, adult: Secondary | ICD-10-CM

## 2024-02-09 DIAGNOSIS — E663 Overweight: Secondary | ICD-10-CM

## 2024-02-09 DIAGNOSIS — Z7985 Long-term (current) use of injectable non-insulin antidiabetic drugs: Secondary | ICD-10-CM

## 2024-02-09 DIAGNOSIS — G43009 Migraine without aura, not intractable, without status migrainosus: Secondary | ICD-10-CM | POA: Diagnosis not present

## 2024-02-09 MED ORDER — ONDANSETRON 8 MG PO TBDP: 8.0000 mg | ORAL_TABLET | Freq: Three times a day (TID) | ORAL | 0 refills | Status: DC | PRN

## 2024-02-09 MED ORDER — TIRZEPATIDE 2.5 MG/0.5ML ~~LOC~~ SOAJ: 2.5000 mg | SUBCUTANEOUS | 1 refills | Status: DC

## 2024-02-09 NOTE — Progress Notes (Signed)
 Office: [phone]  /  Fax: [phone]  WEIGHT SUMMARY AND BIOMETRICS  Starting Date: 04/29/18  Starting Weight: 184lb   Weight Lost Since Last Visit: 3lb   Vitals Temp: 97.9 F (36.6 C) BP: 126/84 Pulse Rate: 86 SpO2: 98 %   Body Composition  Body Fat %: 36 % Fat Mass (lbs): 59.6 lbs Muscle Mass (lbs): 100.4 lbs Total Body Water (lbs): 67.8 lbs Visceral Fat Rating : 8    HPI  Chief Complaint: OBESITY  Jillian Green is here to discuss her progress with her obesity treatment plan. She is on the the Category 3 Plan and states she is following her eating plan approximately 85 % of the time. She states she is exercising 30 minutes 7 times per week.  Interval History:  Since last office visit she is down 3 lb She is down 1 lb of muscle mass and down 1.8 lb of body fat since last visit This gives her a net weight loss of 19 lb in 5 years of medically supervised weight management This is a 10.3% TBW loss She has been getting in more steps, cleaning out storage She did start Mounjaro  2.5 mg weekly She has improved food volumes and reduced snacking and cravings She has upcoming divorce proceedings with her ex Her boyfriend is supportive  Pharmacotherapy: Mounjaro  2.5 mg weekly + metformin  1000 mg bid + Topamax  50 mg daily  PHYSICAL EXAM:  Blood pressure 126/84, pulse 86, temperature 97.9 F (36.6 C), height 5' 5 (1.651 m), weight 165 lb (74.8 kg), last menstrual period 09/12/2018, SpO2 98%. Body mass index is 27.46 kg/m.  General: She is healthy appearing,  cooperative, alert, well developed, and in no acute distress. PSYCH: Has normal mood, affect and thought process.   Lungs: Normal breathing effort, no conversational dyspnea.   ASSESSMENT AND PLAN  TREATMENT PLAN FOR OBESITY:  Recommended Dietary Goals  Jillian Green is currently in the action stage of change. As such, her goal is to continue weight management plan. She has agreed to the Category 3  Plan.  Behavioral Intervention  We discussed the following Behavioral Modification Strategies today: increasing lean protein intake to established goals, increasing fiber rich foods, increasing water intake , work on meal planning and preparation, keeping healthy foods at home, work on managing stress, creating time for self-care and relaxation, avoiding temptations and identifying enticing environmental cues, planning for success, and continue to work on maintaining a reduced calorie state, getting the recommended amount of protein, incorporating whole foods, making healthy choices, staying well hydrated and practicing mindfulness when eating..  Additional resources provided today: NA  Recommended Physical Activity Goals  Jillian Green has been advised to work up to 150 minutes of moderate intensity aerobic activity a week and strengthening exercises 2-3 times per week for cardiovascular health, weight loss maintenance and preservation of muscle mass.   She has agreed to Increase the intensity, frequency or duration of aerobic exercises    Pharmacotherapy changes for the treatment of obesity: d/c metformin   ASSOCIATED CONDITIONS ADDRESSED TODAY  Type 2 diabetes mellitus with other specified complication, without long-term current use of insulin  (HCC) Lab Results  Component Value Date   HGBA1C 6.8 (H) 11/23/2023   Since she has done well starting Mounjaro  and is actively working on diet, exercise and weight reduction, will d/c metformin .  Continue Mounjaro  at 2.5 mg weekly injection.  Continue diabetes management.  Repeat A1c and fasting insulin  in 1-2 mos -     Tirzepatide ; Inject 2.5 mg into  the skin once a week.  Dispense: 2 mL; Refill: 1  Nausea She has been able to tolerate new start Mounjaro  without N/V using Zofran  at the time of injection and the day following injection.  Continue current plan of care.  Will not increase dose of Mounjaro  -     Ondansetron ; Take 1 tablet (8 mg total) by  mouth every 8 (eight) hours as needed.  Dispense: 20 tablet; Refill: 0  Overweight (BMI 25.0-29.9)  BMI 27.0-27.9,adult  Family discord Improving She has an upcoming court date but she feels good about her long term plan and has a good support system Continue to work in good nutrition, regular exercise, stress reduction and self care as she goes thru this more stressful period of time  Migraine without aura and without status migrainosus, not intractable She sees benefit in using Topamax  50 mg once daily for migraine prevention and food impulse control.  She denies adverse SE.  Since she is at risk for low bicarb levels in combination with metformin  and she is now on Mounjaro , will d/c Metformin .    Continue to work on eating on a schedule, avoiding high sugar foods and drinks and hydrating well with water for migraine prevention.    She was informed of the importance of frequent follow up visits to maximize her success with intensive lifestyle modifications for her multiple health conditions.   ATTESTASTION STATEMENTS:  Reviewed by clinician on day of visit: allergies, medications, problem list, medical history, surgical history, family history, social history, and previous encounter notes pertinent to obesity diagnosis.   I have personally spent 30 minutes total time today in preparation, patient care, nutritional counseling and education,  and documentation for this visit, including the following: review of most recent clinical lab tests, prescribing medications/ refilling medications, reviewing medical assistant documentation, review and interpretation of bioimpedence results.     Darice Haddock, D.O. DABFM, DABOM [facility] [address] [place], [place] [address] [phone]

## 2024-02-29 ENCOUNTER — Other Ambulatory Visit: Payer: Self-pay | Admitting: Family Medicine

## 2024-02-29 ENCOUNTER — Encounter: Payer: Self-pay | Admitting: Family Medicine

## 2024-02-29 DIAGNOSIS — F909 Attention-deficit hyperactivity disorder, unspecified type: Secondary | ICD-10-CM

## 2024-02-29 MED ORDER — LISDEXAMFETAMINE DIMESYLATE 70 MG PO CAPS: 70.0000 mg | ORAL_CAPSULE | Freq: Every day | ORAL | 0 refills | Status: DC

## 2024-04-05 ENCOUNTER — Encounter: Payer: Self-pay | Admitting: Family Medicine

## 2024-04-05 ENCOUNTER — Ambulatory Visit (INDEPENDENT_AMBULATORY_CARE_PROVIDER_SITE_OTHER): Admitting: Family Medicine

## 2024-04-05 VITALS — BP 117/78 | HR 91 | Temp 98.3°F | Ht 65.0 in | Wt 164.0 lb

## 2024-04-05 DIAGNOSIS — E663 Overweight: Secondary | ICD-10-CM

## 2024-04-05 DIAGNOSIS — R110 Nausea: Secondary | ICD-10-CM

## 2024-04-05 DIAGNOSIS — E1169 Type 2 diabetes mellitus with other specified complication: Secondary | ICD-10-CM | POA: Diagnosis not present

## 2024-04-05 DIAGNOSIS — Z6827 Body mass index (BMI) 27.0-27.9, adult: Secondary | ICD-10-CM

## 2024-04-05 DIAGNOSIS — Z7985 Long-term (current) use of injectable non-insulin antidiabetic drugs: Secondary | ICD-10-CM

## 2024-04-05 DIAGNOSIS — G43009 Migraine without aura, not intractable, without status migrainosus: Secondary | ICD-10-CM | POA: Diagnosis not present

## 2024-04-05 DIAGNOSIS — Z658 Other specified problems related to psychosocial circumstances: Secondary | ICD-10-CM

## 2024-04-05 MED ORDER — TOPIRAMATE 50 MG PO TABS: 50.0000 mg | ORAL_TABLET | Freq: Every day | ORAL | 0 refills | Status: DC

## 2024-04-05 MED ORDER — TIRZEPATIDE 5 MG/0.5ML ~~LOC~~ SOAJ: 5.0000 mg | SUBCUTANEOUS | 1 refills | Status: DC

## 2024-04-05 MED ORDER — ONDANSETRON 8 MG PO TBDP: 8.0000 mg | ORAL_TABLET | Freq: Three times a day (TID) | ORAL | 1 refills | Status: DC | PRN

## 2024-04-05 NOTE — Progress Notes (Signed)
 Office: [phone]  /  Fax: [phone]  WEIGHT SUMMARY AND BIOMETRICS  Starting Date: 04/29/18  Starting Weight: 184lb   Weight Lost Since Last Visit: 1lb   Vitals Temp: 98.3 F (36.8 C) BP: 117/78 Pulse Rate: 91 SpO2: 98 %   Body Composition  Body Fat %: 36.4 % Fat Mass (lbs): 59.8 lbs Muscle Mass (lbs): 99.2 lbs Total Body Water (lbs): 68.4 lbs Visceral Fat Rating : 8     HPI  Chief Complaint: OBESITY  Jillian Green is here to discuss her progress with her obesity treatment plan. She is on the the Category 3 Plan and states she is following her eating plan approximately 40-50 % of the time. She states she is exercising 0 minutes 0 times per week.   Interval History:  Since last office visit she is down 1 lb She is doing better on Mounjaro  2.5 mg weekly She is working to avoid meal skipping and is financially strained without a job She is mindful of portion sizes.  She avoids fried foods She uses Zofran  prn for nausea which is helpful She is working on improving hydration Her motivation to exercise has been lacking though she would like to walk the dogs more She has a net weight loss of 20 lb in 5 years of medically supervised weight management which is >10% TBW loss  Pharmacotherapy: Mounjaro  2.5 mg weekly  PHYSICAL EXAM:  Blood pressure 117/78, pulse 91, temperature 98.3 F (36.8 C), height 5' 5 (1.651 m), weight 164 lb (74.4 kg), last menstrual period 09/12/2018, SpO2 98%. Body mass index is 27.29 kg/m.  General: She is healthy appearing,  cooperative, alert, well developed, and in no acute distress. PSYCH: Has normal mood, affect and thought process.   Lungs: Normal breathing effort, no conversational dyspnea.  ASSESSMENT AND PLAN  TREATMENT PLAN FOR OBESITY:  Recommended Dietary Goals  Jillian Green is currently in the action stage of change. As such, her goal is to continue weight management plan. She has agreed to practicing portion control and making  smarter food choices, such as increasing vegetables and decreasing simple carbohydrates.  Behavioral Intervention  We discussed the following Behavioral Modification Strategies today: increasing lean protein intake to established goals, increasing fiber rich foods, increasing water intake , work on meal planning and preparation, keeping healthy foods at home, work on managing stress, creating time for self-care and relaxation, avoiding temptations and identifying enticing environmental cues, continue to work on implementation of reduced calorie nutritional plan, continue to work on maintaining a reduced calorie state, getting the recommended amount of protein, incorporating whole foods, making healthy choices, staying well hydrated and practicing mindfulness when eating., and increase protein intake, fibrous foods (25 grams per day for women, 30 grams for men) and water to improve satiety and decrease hunger signals. .  Additional resources provided today: NA  Recommended Physical Activity Goals  Jillian Green has been advised to work up to 150 minutes of moderate intensity aerobic activity a week and strengthening exercises 2-3 times per week for cardiovascular health, weight loss maintenance and preservation of muscle mass.   She has agreed to Increase the intensity, frequency or duration of aerobic exercises    Pharmacotherapy changes for the treatment of obesity: increase Mounjaro  to 5 mg weekly  ASSOCIATED CONDITIONS ADDRESSED TODAY  Migraine without aura and without status migrainosus, not intractable Migraine frequency and intensity has improved on topamax  50 mg daily without adverse SE Repeat chemistry panel next visit -     Topiramate ; Take  1 tablet (50 mg total) by mouth daily.  Dispense: 90 tablet; Refill: 0  Nausea Improved on Zofran  8 mg at time of Mounjaro  injection and the next day -     Ondansetron ; Take 1 tablet (8 mg total) by mouth every 8 (eight) hours as needed.  Dispense: 20  tablet; Refill: 1  Type 2 diabetes mellitus with other specified complication, without long-term current use of insulin  (HCC) Lab Results  Component Value Date   HGBA1C 6.8 (H) 11/23/2023   She did come off of metformin  She is doing well on Mounjaro  without meal skipping or vomiting Will increase from 2.5 mg to 5 mg weekly Continue to work on reducing intake of added sugar and increasing walking time Repeat fasting insulin  and A1c next visit -     Tirzepatide ; Inject 5 mg into the skin once a week.  Dispense: 2 mL; Refill: 1  Overweight (BMI 25.0-29.9)  BMI 27.0-27.9,adult  Psychosocial distress She has been thru divorce proceedings with her husband so stressors were high but are now better.  She has been talking w/ her daughter and she is still looking for a job.  Finances have affected eating habits.  We discussed ways to eat on a budget with plain greek yogurt, canned tuna/ chicken, dried beans, oats, frozen fruits and veggies.  She is actively job seeking.  This stress have affected her motivation to make more lifestyle changes but she feels ready to work on these in the next month.    She was informed of the importance of frequent follow up visits to maximize her success with intensive lifestyle modifications for her multiple health conditions.   ATTESTASTION STATEMENTS:  Reviewed by clinician on day of visit: allergies, medications, problem list, medical history, surgical history, family history, social history, and previous encounter notes pertinent to obesity diagnosis.   I have personally spent 30 minutes total time today in preparation, patient care, nutritional counseling and education,  and documentation for this visit, including the following: review of most recent clinical lab tests, prescribing medications/ refilling medications, reviewing medical assistant documentation, review and interpretation of bioimpedence results.     Jillian Green, D.O. DABFM, DABOM [facility] [address] [place], [place] [address] [phone]

## 2024-05-11 ENCOUNTER — Encounter: Payer: Self-pay | Admitting: Family Medicine

## 2024-05-11 ENCOUNTER — Ambulatory Visit (INDEPENDENT_AMBULATORY_CARE_PROVIDER_SITE_OTHER): Admitting: Family Medicine

## 2024-05-11 VITALS — BP 112/75 | HR 85 | Temp 98.0°F | Ht 65.0 in | Wt 163.0 lb

## 2024-05-11 DIAGNOSIS — Z7985 Long-term (current) use of injectable non-insulin antidiabetic drugs: Secondary | ICD-10-CM

## 2024-05-11 DIAGNOSIS — E663 Overweight: Secondary | ICD-10-CM

## 2024-05-11 DIAGNOSIS — E1169 Type 2 diabetes mellitus with other specified complication: Secondary | ICD-10-CM

## 2024-05-11 DIAGNOSIS — R5383 Other fatigue: Secondary | ICD-10-CM

## 2024-05-11 DIAGNOSIS — G43009 Migraine without aura, not intractable, without status migrainosus: Secondary | ICD-10-CM

## 2024-05-11 DIAGNOSIS — Z6827 Body mass index (BMI) 27.0-27.9, adult: Secondary | ICD-10-CM

## 2024-05-11 DIAGNOSIS — F909 Attention-deficit hyperactivity disorder, unspecified type: Secondary | ICD-10-CM

## 2024-05-11 MED ORDER — TIRZEPATIDE 2.5 MG/0.5ML ~~LOC~~ SOAJ: 2.5000 mg | SUBCUTANEOUS | 1 refills | Status: DC

## 2024-05-11 MED ORDER — TOPIRAMATE 50 MG PO TABS
50.0000 mg | ORAL_TABLET | Freq: Every day | ORAL | 0 refills | Status: AC
Start: 2024-05-11 — End: ?

## 2024-05-11 NOTE — Progress Notes (Signed)
 Office: [phone]  /  Fax: [phone]  WEIGHT SUMMARY AND BIOMETRICS  Starting Date: 04/29/18  Starting Weight: 184lb   Weight Lost Since Last Visit: 1lb   Vitals Temp: 98 F (36.7 C) BP: 112/75 Pulse Rate: 85 SpO2: 98 %   Body Composition  Body Fat %: 35.6 % Fat Mass (lbs): 58.2 lbs Muscle Mass (lbs): 99.8 lbs Total Body Water (lbs): 67.2 lbs Visceral Fat Rating : 7    HPI  Chief Complaint: OBESITY  Jillian Green is here to discuss her progress with her obesity treatment plan. She is on the the Category 3 Plan and states she is following her eating plan approximately 50 % of the time. She states she is exercising 30 minutes 3 times per week.   Interval History:  Since last office visit she is down 1 lb She is up 0.6 lb of muscle mass and is down 1.6 lb of body fat since last This gives her a net weight loss of 21 lb in the past 5 years of medically supervised weight management This is an 11% TBW loss Her daughter is pregnant again She will be starting a new job on Monday She has been without Mounjaro  x 2 weeks (pharmacy would not fill the 5 mg dose) She has been feeling more tired She did stop Metformin  She is working on improving protein intake She plans to add in free weights from home and she is walking her dog outdoors more  Pharmacotherapy: Mounjaro  2.5 mg weekly  PHYSICAL EXAM:  Blood pressure 112/75, pulse 85, temperature 98 F (36.7 C), height 5' 5 (1.651 m), weight 163 lb (73.9 kg), last menstrual period 09/12/2018, SpO2 98%. Body mass index is 27.12 kg/m.  General: She is healthy appearing, cooperative, alert, well developed, and in no acute distress. PSYCH: Has normal mood, affect and thought process.   Lungs: Normal breathing effort, no conversational dyspnea.  ASSESSMENT AND PLAN  TREATMENT PLAN FOR OBESITY:  Recommended Dietary Goals  Jillian Green is currently in the action stage of change. As such, her goal is to continue weight management  plan. She has agreed to practicing portion control and making smarter food choices, such as increasing vegetables and decreasing simple carbohydrates.  Behavioral Intervention  We discussed the following Behavioral Modification Strategies today: increasing lean protein intake to established goals, increasing fiber rich foods, increasing water intake , work on meal planning and preparation, keeping healthy foods at home, work on managing stress, creating time for self-care and relaxation, avoiding temptations and identifying enticing environmental cues, continue to practice mindfulness when eating, and planning for success.  Additional resources provided today: NA  Recommended Physical Activity Goals  Jillian Green has been advised to work up to 150 minutes of moderate intensity aerobic activity a week and strengthening exercises 2-3 times per week for cardiovascular health, weight loss maintenance and preservation of muscle mass.   She has agreed to Work on scheduling and tracking physical activity.   Pharmacotherapy changes for the treatment of obesity: none  ASSOCIATED CONDITIONS ADDRESSED TODAY  Type 2 diabetes mellitus with other specified complication, without long-term current use of insulin  (HCC) Lab Results  Component Value Date   HGBA1C 6.8 (H) 11/23/2023  Dong well on Mounjaro  2.5 mg weekly Was unable to get 5 mg dose  Working on dietary and exercise changes -     Tirzepatide ; Inject 2.5 mg into the skin once a week.  Dispense: 2 mL; Refill: 1 -     Insulin , random -  Hemoglobin A1c -     Basic metabolic panel with GFR  Other fatigue -     VITAMIN D  25 Hydroxy (Vit-D Deficiency, Fractures) -     Vitamin B12  Migraine without aura and without status migrainosus, not intractable -     Topiramate ; Take 1 tablet (50 mg total) by mouth daily.  Dispense: 90 tablet; Refill: 0 Reduction in headache frequency with improvement in food impulse control  Overweight (BMI  25.0-29.9)  BMI 27.0-27.9,adult      She was informed of the importance of frequent follow up visits to maximize her success with intensive lifestyle modifications for her multiple health conditions.   ATTESTASTION STATEMENTS:  Reviewed by clinician on day of visit: allergies, medications, problem list, medical history, surgical history, family history, social history, and previous encounter notes pertinent to obesity diagnosis.   I have personally spent 30 minutes total time today in preparation, patient care, nutritional counseling and education,  and documentation for this visit, including the following: review of most recent clinical lab tests, prescribing medications/ refilling medications, reviewing medical assistant documentation, review and interpretation of bioimpedence results.     Darice Haddock, D.O. DABFM, DABOM [facility] [address] [place], [place] [address] [phone]

## 2024-05-12 ENCOUNTER — Other Ambulatory Visit: Payer: Self-pay | Admitting: Family Medicine

## 2024-05-12 ENCOUNTER — Ambulatory Visit: Payer: Self-pay | Admitting: Family Medicine

## 2024-05-12 DIAGNOSIS — F909 Attention-deficit hyperactivity disorder, unspecified type: Secondary | ICD-10-CM

## 2024-05-12 LAB — VITAMIN D 25 HYDROXY (VIT D DEFICIENCY, FRACTURES): Vit D, 25-Hydroxy: 28.2 ng/mL — ABNORMAL LOW (ref 30.0–100.0)

## 2024-05-12 LAB — BASIC METABOLIC PANEL WITH GFR
BUN/Creatinine Ratio: 14 (ref 9–23)
BUN: 12 mg/dL (ref 6–24)
CO2: 21 mmol/L (ref 20–29)
Calcium: 9.6 mg/dL (ref 8.7–10.2)
Chloride: 105 mmol/L (ref 96–106)
Creatinine, Ser: 0.84 mg/dL (ref 0.57–1.00)
Glucose: 156 mg/dL — ABNORMAL HIGH (ref 70–99)
Potassium: 4.7 mmol/L (ref 3.5–5.2)
Sodium: 143 mmol/L (ref 134–144)
eGFR: 84 mL/min/1.73 (ref >59)

## 2024-05-12 LAB — HEMOGLOBIN A1C
Est. average glucose Bld gHb Est-mCnc: 131 mg/dL
Hgb A1c MFr Bld: 6.2 % — ABNORMAL HIGH (ref 4.8–5.6)

## 2024-05-12 LAB — VITAMIN B12: Vitamin B-12: 623 pg/mL (ref 232–1245)

## 2024-05-12 LAB — INSULIN, RANDOM: INSULIN: 12.8 u[IU]/mL (ref 2.6–24.9)

## 2024-05-12 MED ORDER — LISDEXAMFETAMINE DIMESYLATE 70 MG PO CAPS: 70.0000 mg | ORAL_CAPSULE | Freq: Every day | ORAL | 0 refills | Status: DC

## 2024-05-12 NOTE — Telephone Encounter (Signed)
 Requesting: Vyvanse  70mg   Contract:05/29/23 UDS:05/29/23 Last Visit: 12/29/23  Next Visit: None Last Refill: 03/26/24 #30 and 0RF   Please Advise

## 2024-06-20 ENCOUNTER — Encounter: Payer: Self-pay | Admitting: Family Medicine

## 2024-06-20 ENCOUNTER — Ambulatory Visit: Admitting: Family Medicine

## 2024-06-20 VITALS — BP 139/90 | HR 96 | Temp 97.7°F | Ht 65.0 in | Wt 163.0 lb

## 2024-06-20 DIAGNOSIS — E663 Overweight: Secondary | ICD-10-CM | POA: Diagnosis not present

## 2024-06-20 DIAGNOSIS — Z7985 Long-term (current) use of injectable non-insulin antidiabetic drugs: Secondary | ICD-10-CM

## 2024-06-20 DIAGNOSIS — E1169 Type 2 diabetes mellitus with other specified complication: Secondary | ICD-10-CM

## 2024-06-20 DIAGNOSIS — G43009 Migraine without aura, not intractable, without status migrainosus: Secondary | ICD-10-CM

## 2024-06-20 DIAGNOSIS — Z6827 Body mass index (BMI) 27.0-27.9, adult: Secondary | ICD-10-CM

## 2024-06-20 DIAGNOSIS — R110 Nausea: Secondary | ICD-10-CM | POA: Diagnosis not present

## 2024-06-20 MED ORDER — ONDANSETRON 8 MG PO TBDP
8.0000 mg | ORAL_TABLET | Freq: Three times a day (TID) | ORAL | 1 refills | Status: AC | PRN
Start: 2024-06-20 — End: ?

## 2024-06-20 NOTE — Patient Instructions (Addendum)
 Add vitamin D  2,000 international units  daily  Restart Mounjaro  at 2.5 mg weekly  Continue to walk daily- stairs/ weights are great!  Continue to work on : Eating on a schedule 1500 kcal / day is goal
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
## 2024-07-01 ENCOUNTER — Encounter: Payer: Self-pay | Admitting: Family Medicine

## 2024-07-01 DIAGNOSIS — F909 Attention-deficit hyperactivity disorder, unspecified type: Secondary | ICD-10-CM

## 2024-07-04 ENCOUNTER — Other Ambulatory Visit: Payer: Self-pay | Admitting: Family Medicine

## 2024-07-04 DIAGNOSIS — F419 Anxiety disorder, unspecified: Secondary | ICD-10-CM

## 2024-07-04 DIAGNOSIS — F909 Attention-deficit hyperactivity disorder, unspecified type: Secondary | ICD-10-CM

## 2024-07-04 MED ORDER — LISDEXAMFETAMINE DIMESYLATE 70 MG PO CAPS: 70.0000 mg | ORAL_CAPSULE | Freq: Every day | ORAL | 0 refills | Status: DC

## 2024-07-04 MED ORDER — ALPRAZOLAM 0.5 MG PO TABS: 0.5000 mg | ORAL_TABLET | Freq: Three times a day (TID) | ORAL | 1 refills | Status: AC | PRN

## 2024-07-04 NOTE — Telephone Encounter (Signed)
 Requesting: Vyvanse  70mg  Contract: 05/2023 UDS: 05/2023 Last OV: 12/29/2023 Next OV: n/a Last Refill: 05/12/2024, #30--0 RF Database:   Please advise

## 2024-07-18 ENCOUNTER — Telehealth: Admitting: Family Medicine

## 2024-07-18 DIAGNOSIS — E663 Overweight: Secondary | ICD-10-CM

## 2024-07-18 DIAGNOSIS — G43009 Migraine without aura, not intractable, without status migrainosus: Secondary | ICD-10-CM

## 2024-07-18 DIAGNOSIS — Z658 Other specified problems related to psychosocial circumstances: Secondary | ICD-10-CM | POA: Diagnosis not present

## 2024-07-18 DIAGNOSIS — E1169 Type 2 diabetes mellitus with other specified complication: Secondary | ICD-10-CM | POA: Diagnosis not present

## 2024-07-18 DIAGNOSIS — Z7985 Long-term (current) use of injectable non-insulin antidiabetic drugs: Secondary | ICD-10-CM | POA: Diagnosis not present

## 2024-07-18 DIAGNOSIS — R110 Nausea: Secondary | ICD-10-CM | POA: Diagnosis not present

## 2024-07-18 DIAGNOSIS — Z6827 Body mass index (BMI) 27.0-27.9, adult: Secondary | ICD-10-CM

## 2024-07-18 MED ORDER — TOPIRAMATE 50 MG PO TABS: 50.0000 mg | ORAL_TABLET | Freq: Every day | ORAL | 0 refills | Status: AC

## 2024-07-18 MED ORDER — TIRZEPATIDE 2.5 MG/0.5ML ~~LOC~~ SOAJ: 2.5000 mg | SUBCUTANEOUS | 1 refills | Status: AC

## 2024-07-18 MED ORDER — ONDANSETRON 8 MG PO TBDP: 8.0000 mg | ORAL_TABLET | Freq: Three times a day (TID) | ORAL | 1 refills | Status: AC | PRN
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
## 2024-08-09 DIAGNOSIS — F909 Attention-deficit hyperactivity disorder, unspecified type: Secondary | ICD-10-CM

## 2024-08-09 MED ORDER — LISDEXAMFETAMINE DIMESYLATE 70 MG PO CAPS: 70.0000 mg | ORAL_CAPSULE | Freq: Every day | ORAL | 0 refills | Status: AC

## 2024-08-09 NOTE — Telephone Encounter (Signed)
 Requesting: Vyvanse  Contract: 05/29/2023 UDS: 05/29/2023 Last OV: 12/29/2023 Next OV: n/a Last Refill: 07/04/2024, #30--0 RF Database:   Please advise

## 2024-08-15 ENCOUNTER — Other Ambulatory Visit (HOSPITAL_COMMUNITY): Payer: Self-pay

## 2024-08-15 ENCOUNTER — Other Ambulatory Visit: Payer: Self-pay | Admitting: Family Medicine

## 2024-08-15 ENCOUNTER — Telehealth: Payer: Self-pay

## 2024-08-15 DIAGNOSIS — M797 Fibromyalgia: Secondary | ICD-10-CM

## 2024-08-15 DIAGNOSIS — F3289 Other specified depressive episodes: Secondary | ICD-10-CM

## 2024-08-15 NOTE — Telephone Encounter (Signed)
 Pharmacy Patient Advocate Encounter   Received notification from Physician's Office that prior authorization for Lisdexamfetamine  Dimesylate 70MG  capsules is required/requested.   Insurance verification completed.   The patient is insured through [facility] MEDICAID.   Per test claim: PA required; PA started via CoverMyMeds. KEY [id number] . Waiting for clinical questions to populate.

## 2024-08-15 NOTE — Telephone Encounter (Signed)
 Copied from CRM [id number]. Topic: Clinical - Medication Prior Auth >> Aug 15, 2024  9:05 AM Willma SAUNDERS wrote: Reason for CRM: Patient states he pharmacy advised prior authorization si required for lisdexamfetamine  (VYVANSE ) 70 MG capsule.  Patient can be reached at [phone], ok to leave vm

## 2024-08-18 ENCOUNTER — Other Ambulatory Visit (HOSPITAL_COMMUNITY): Payer: Self-pay

## 2024-08-18 NOTE — Telephone Encounter (Signed)
 Pharmacy Patient Advocate Encounter  Received notification from [facility] MEDICAID that Prior Authorization for  Lisdexamfetamine  Dimesylate 70MG  capsules has been APPROVED from 08/15/2024 to 08/15/2025. Ran test claim, Copay is $20. This test claim was processed through [facility]- copay amounts may vary at other pharmacies due to pharmacy/plan contracts, or as the patient moves through the different stages of their insurance plan.   PA #/Case ID/Reference #: [id number]

## 2024-08-18 NOTE — Telephone Encounter (Signed)
 Mychart sent to patient.
